# Patient Record
Sex: Female | Born: 1940 | Hispanic: No | Marital: Single
Health system: Southern US, Community
[De-identification: ages and names within clinical notes are randomized; demographics above are authoritative.]

## PROBLEM LIST (undated history)

## (undated) ENCOUNTER — Emergency Department (HOSPITAL_COMMUNITY): Discharge status: Absent without leave | Payer: Self-pay

## (undated) DIAGNOSIS — R29898 Other symptoms and signs involving the musculoskeletal system: Secondary | ICD-10-CM

## (undated) DIAGNOSIS — H538 Other visual disturbances: Secondary | ICD-10-CM

## (undated) DIAGNOSIS — I10 Essential (primary) hypertension: Secondary | ICD-10-CM

## (undated) DIAGNOSIS — I499 Cardiac arrhythmia, unspecified: Secondary | ICD-10-CM

## (undated) DIAGNOSIS — M79606 Pain in leg, unspecified: Secondary | ICD-10-CM

## (undated) DIAGNOSIS — A419 Sepsis, unspecified organism: Secondary | ICD-10-CM

## (undated) DIAGNOSIS — N12 Tubulo-interstitial nephritis, not specified as acute or chronic: Secondary | ICD-10-CM

## (undated) DIAGNOSIS — I639 Cerebral infarction, unspecified: Secondary | ICD-10-CM

## (undated) DIAGNOSIS — Z87442 Personal history of urinary calculi: Secondary | ICD-10-CM

## (undated) DIAGNOSIS — Z95818 Presence of other cardiac implants and grafts: Secondary | ICD-10-CM

## (undated) DIAGNOSIS — R269 Unspecified abnormalities of gait and mobility: Secondary | ICD-10-CM

## (undated) DIAGNOSIS — E119 Type 2 diabetes mellitus without complications: Secondary | ICD-10-CM

## (undated) DIAGNOSIS — IMO0002 Reserved for concepts with insufficient information to code with codable children: Secondary | ICD-10-CM

## (undated) DIAGNOSIS — E1165 Type 2 diabetes mellitus with hyperglycemia: Secondary | ICD-10-CM

## (undated) DIAGNOSIS — I8390 Asymptomatic varicose veins of unspecified lower extremity: Secondary | ICD-10-CM

## (undated) DIAGNOSIS — E785 Hyperlipidemia, unspecified: Secondary | ICD-10-CM

## (undated) DIAGNOSIS — S42213A Unspecified displaced fracture of surgical neck of unspecified humerus, initial encounter for closed fracture: Secondary | ICD-10-CM

## (undated) DIAGNOSIS — I739 Peripheral vascular disease, unspecified: Secondary | ICD-10-CM

## (undated) DIAGNOSIS — M1990 Unspecified osteoarthritis, unspecified site: Secondary | ICD-10-CM

## (undated) HISTORY — DX: Essential (primary) hypertension: I10

## (undated) HISTORY — DX: Type 2 diabetes mellitus with hyperglycemia: E11.65

## (undated) HISTORY — DX: Reserved for concepts with insufficient information to code with codable children: IMO0002

## (undated) HISTORY — DX: Pain in leg, unspecified: M79.606

## (undated) HISTORY — DX: Peripheral vascular disease, unspecified: I73.9

## (undated) HISTORY — DX: Hyperlipidemia, unspecified: E78.5

## (undated) HISTORY — PX: VEIN SURGERY: SHX48

## (undated) HISTORY — DX: Asymptomatic varicose veins of unspecified lower extremity: I83.90

---

## 1997-08-16 ENCOUNTER — Emergency Department (HOSPITAL_COMMUNITY): Admission: EM | Admit: 1997-08-16 | Discharge: 1997-08-16 | Payer: Self-pay | Admitting: Emergency Medicine

## 1997-10-17 ENCOUNTER — Emergency Department (HOSPITAL_COMMUNITY): Admission: EM | Admit: 1997-10-17 | Discharge: 1997-10-18 | Payer: Self-pay | Admitting: Emergency Medicine

## 1999-08-10 ENCOUNTER — Ambulatory Visit (HOSPITAL_COMMUNITY): Admission: RE | Admit: 1999-08-10 | Discharge: 1999-08-10 | Payer: Self-pay

## 2002-01-22 ENCOUNTER — Ambulatory Visit (HOSPITAL_COMMUNITY): Admission: RE | Admit: 2002-01-22 | Discharge: 2002-01-22 | Payer: Self-pay | Admitting: Internal Medicine

## 2002-01-22 ENCOUNTER — Encounter: Payer: Self-pay | Admitting: Internal Medicine

## 2003-01-15 ENCOUNTER — Emergency Department (HOSPITAL_COMMUNITY): Admission: EM | Admit: 2003-01-15 | Discharge: 2003-01-15 | Payer: Self-pay | Admitting: Emergency Medicine

## 2003-01-16 ENCOUNTER — Ambulatory Visit (HOSPITAL_COMMUNITY): Admission: RE | Admit: 2003-01-16 | Discharge: 2003-01-16 | Payer: Self-pay | Admitting: Emergency Medicine

## 2009-05-04 ENCOUNTER — Ambulatory Visit: Payer: Self-pay | Admitting: Family Medicine

## 2009-05-04 ENCOUNTER — Encounter (INDEPENDENT_AMBULATORY_CARE_PROVIDER_SITE_OTHER): Payer: Self-pay | Admitting: Family Medicine

## 2009-05-04 LAB — CONVERTED CEMR LAB
ALT: 14 units/L (ref 0–35)
Albumin: 4.2 g/dL (ref 3.5–5.2)
Basophils Relative: 0 % (ref 0–1)
CO2: 26 meq/L (ref 19–32)
Cholesterol: 208 mg/dL — ABNORMAL HIGH (ref 0–200)
Glucose, Bld: 207 mg/dL — ABNORMAL HIGH (ref 70–99)
Hemoglobin: 14.8 g/dL (ref 12.0–15.0)
LDL Cholesterol: 117 mg/dL — ABNORMAL HIGH (ref 0–99)
Lymphocytes Relative: 30 % (ref 12–46)
Lymphs Abs: 2.5 10*3/uL (ref 0.7–4.0)
MCHC: 31.1 g/dL (ref 30.0–36.0)
Monocytes Absolute: 0.5 10*3/uL (ref 0.1–1.0)
Monocytes Relative: 6 % (ref 3–12)
Neutro Abs: 5.3 10*3/uL (ref 1.7–7.7)
Neutrophils Relative %: 63 % (ref 43–77)
Potassium: 4.7 meq/L (ref 3.5–5.3)
RBC: 5.75 M/uL — ABNORMAL HIGH (ref 3.87–5.11)
Sodium: 140 meq/L (ref 135–145)
Total Bilirubin: 0.8 mg/dL (ref 0.3–1.2)
Total Protein: 7.2 g/dL (ref 6.0–8.3)
VLDL: 37 mg/dL (ref 0–40)
Vit D, 25-Hydroxy: 18 ng/mL — ABNORMAL LOW (ref 30–89)
WBC: 8.4 10*3/uL (ref 4.0–10.5)

## 2009-05-26 ENCOUNTER — Ambulatory Visit: Payer: Self-pay | Admitting: Internal Medicine

## 2009-05-31 ENCOUNTER — Ambulatory Visit: Payer: Self-pay | Admitting: Internal Medicine

## 2009-06-17 ENCOUNTER — Ambulatory Visit (HOSPITAL_COMMUNITY): Admission: RE | Admit: 2009-06-17 | Discharge: 2009-06-17 | Payer: Self-pay | Admitting: Family Medicine

## 2009-07-01 ENCOUNTER — Ambulatory Visit: Payer: Self-pay | Admitting: Family Medicine

## 2010-02-09 ENCOUNTER — Emergency Department (HOSPITAL_COMMUNITY)
Admission: EM | Admit: 2010-02-09 | Discharge: 2010-02-09 | Payer: Self-pay | Source: Home / Self Care | Admitting: Emergency Medicine

## 2010-05-24 LAB — COMPREHENSIVE METABOLIC PANEL
ALT: 37 U/L — ABNORMAL HIGH (ref 0–35)
BUN: 14 mg/dL (ref 6–23)
CO2: 29 mEq/L (ref 19–32)
Calcium: 8.7 mg/dL (ref 8.4–10.5)
Creatinine, Ser: 0.97 mg/dL (ref 0.4–1.2)
GFR calc non Af Amer: 57 mL/min — ABNORMAL LOW (ref >60)
Glucose, Bld: 104 mg/dL — ABNORMAL HIGH (ref 70–99)

## 2010-05-24 LAB — URINALYSIS, ROUTINE W REFLEX MICROSCOPIC
Glucose, UA: NEGATIVE mg/dL
Protein, ur: 30 mg/dL — AB
Specific Gravity, Urine: 1.015 (ref 1.005–1.030)
Urobilinogen, UA: 1 mg/dL (ref 0.0–1.0)

## 2010-05-24 LAB — CBC
HCT: 45.7 % (ref 36.0–46.0)
Hemoglobin: 14.5 g/dL (ref 12.0–15.0)
MCH: 26.4 pg (ref 26.0–34.0)
MCHC: 31.7 g/dL (ref 30.0–36.0)
MCV: 83.1 fL (ref 78.0–100.0)

## 2010-05-24 LAB — DIFFERENTIAL
Eosinophils Absolute: 0 10*3/uL (ref 0.0–0.7)
Lymphocytes Relative: 5 % — ABNORMAL LOW (ref 12–46)
Lymphs Abs: 0.4 10*3/uL — ABNORMAL LOW (ref 0.7–4.0)
Neutro Abs: 7.7 10*3/uL (ref 1.7–7.7)
Neutrophils Relative %: 95 % — ABNORMAL HIGH (ref 43–77)

## 2010-05-24 LAB — LIPASE, BLOOD: Lipase: 24 U/L (ref 11–59)

## 2010-05-24 LAB — GLUCOSE, CAPILLARY: Glucose-Capillary: 174 mg/dL — ABNORMAL HIGH (ref 70–99)

## 2010-05-24 LAB — URINE MICROSCOPIC-ADD ON

## 2010-07-29 NOTE — Op Note (Signed)
[facility]  Patient:    Meghan Ford, Meghan Ford                    MRN: [id number] Proc. Date: 08/10/99 Adm. Date:  [id number] Disc. 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  PROCEDURE:  Ligation and complete stripping of the left long saphenous vein.  SURGEON:  Zigmund Daniel, M.D.  ANESTHESIA:  General.  DESCRIPTION OF PROCEDURE:  After adequate general anesthesia and routine preparation of the left lower abdomen, groin, leg, and foot, I made first an incision just medial to the femoral pulse just below the groin crease.  I dissected down and exposed the long saphenous vein and clipped and in certain cases divided its tributaries.  I exposed it for a good distance until I saw it entering the fossa ovalis and controlled it proximally and distally.  I made an incision anterior to the medial malleolus and dissected down through the scarred soft tissue and encountered the long saphenous vein at the ankle. I ligated it toward the foot and controlled it proximally and passed the stripper up.  I went up as far as the proximal medial leg and would not go any farther.  I cut down on it at that point and exposed the vein and found it quite fragile.  Despite a lot of manipulation, I could not get the stripper to pass up past the knee.  I stripped out the distal part of the vein and got hemostasis with pressure.  I then also removed a number of marked varicosities, which were not immediately under the crus of the vein.  Bleeding was not a problem.  I could not get the stripper to pass up the long saphenous vein from the knee area.  I passed it down from the groin after making a small hole in the long saphenous vein, and it went to the  mid-thigh.  I encountered a valve or kink, and I could not get it any farther down until I cut down on it, and I was able to manipulate it on down to the proximal leg.  This required one small additional counter incision.  I then stripped out the long saphenous vein in the thigh and got hemostasis with pressure.  After assuring good hemostasis, I closed all incisions with staples.  I then thoroughly cleaned up the ankle ulcer and applied a moist bandage.  I applied 4 x 4s to all the incisions, cleaning them up, and wrapped the leg with a bulky compressive bandage.  The patient tolerated the operation well. DD:  08/10/99 TD:  08/13/99 Job: [id number]

## 2011-05-10 ENCOUNTER — Other Ambulatory Visit: Payer: Self-pay

## 2011-05-10 DIAGNOSIS — M79609 Pain in unspecified limb: Secondary | ICD-10-CM

## 2011-05-10 DIAGNOSIS — I83893 Varicose veins of bilateral lower extremities with other complications: Secondary | ICD-10-CM

## 2011-06-20 ENCOUNTER — Encounter: Payer: Self-pay | Admitting: Vascular Surgery

## 2011-07-05 ENCOUNTER — Encounter: Payer: Self-pay | Admitting: Vascular Surgery

## 2011-07-06 ENCOUNTER — Encounter: Payer: Self-pay | Admitting: Vascular Surgery

## 2011-08-09 ENCOUNTER — Encounter: Payer: Self-pay | Admitting: Vascular Surgery

## 2011-08-10 ENCOUNTER — Ambulatory Visit (INDEPENDENT_AMBULATORY_CARE_PROVIDER_SITE_OTHER): Payer: Self-pay | Admitting: Vascular Surgery

## 2011-08-10 ENCOUNTER — Encounter: Payer: Self-pay | Admitting: Vascular Surgery

## 2011-08-10 ENCOUNTER — Ambulatory Visit (INDEPENDENT_AMBULATORY_CARE_PROVIDER_SITE_OTHER): Payer: Self-pay | Admitting: *Deleted

## 2011-08-10 VITALS — BP 152/70 | HR 69 | Temp 98.3°F | Ht 64.0 in | Wt 226.0 lb

## 2011-08-10 DIAGNOSIS — I83893 Varicose veins of bilateral lower extremities with other complications: Secondary | ICD-10-CM

## 2011-08-10 DIAGNOSIS — I872 Venous insufficiency (chronic) (peripheral): Secondary | ICD-10-CM

## 2011-08-10 DIAGNOSIS — M79609 Pain in unspecified limb: Secondary | ICD-10-CM

## 2011-08-10 NOTE — Progress Notes (Signed)
[facility] [place] HISTORY AND PHYSICAL   History of Present Illness:  Patient is a 71 y.o. year old female who presents for evaluation of varicose veins .  The patient previously underwent some type of vein stripping procedure by Dr. Orson Slick several years ago for a left malleolus ulcer. The ulcer healed after that procedure. She complains primarily of achiness heaviness and pain in the right leg. She states that the pain gets worse in her right leg after standing for long periods. She has worn compression stockings some in the past. She really has no left leg complaints. She has had no ulcerations recently. Other medica l problems include diabetes, hypertension.  The patient speaks only Spanish and interpretation was through her son  Past Medical History  Diagnosis Date  . Diabetes mellitus   . Hypertension   . Peripheral vascular disease   . Leg pain     worse with prolonged standing  . Varicose veins     History reviewed. No pertinent past surgical history.   Social History History  Substance Use Topics  . Smoking status: Never Smoker   . Smokeless tobacco: Never Used  . Alcohol Use: No    Family History History reviewed. No pertinent family history.  Allergies  No Known Allergies   Current Outpatient Prescriptions  Medication Sig Dispense Refill  . insulin aspart (NOVOLOG FLEXPEN) 100 UNIT/ML injection Inject 10 Units into the skin 3 (three) times daily before meals.      . insulin glargine (LANTUS) 100 UNIT/ML injection Inject 30 Units into the skin at bedtime.      Marland Kitchen lisinopril-hydrochlorothiazide (PRINZIDE,ZESTORETIC) 20-25 MG per tablet Take 1 tablet by mouth daily.        ROS:   General:  No weight loss, Fever, chills  HEENT: No recent headaches, no nasal bleeding, no visual changes, no sore throat  Neurologic: No dizziness, blackouts, seizures. No recent symptoms of stroke or mini- stroke. No recent episodes of slurred speech, or  temporary blindness.  Cardiac: No recent episodes of chest pain/pressure, no shortness of breath at rest.  No shortness of breath with exertion.  Denies history of atrial fibrillation or irregular heartbeat  Vascular: No history of rest pain in feet.  No history of claudication.  No history of non-healing ulcer, No history of DVT   Pulmonary: No home oxygen, no productive cough, no hemoptysis,  No asthma or wheezing  Musculoskeletal:  [ ]  Arthritis, [ ]  Low back pain,  [ ]  Joint pain  Hematologic:No history of hypercoagulable state.  No history of easy bleeding.  No history of anemia  Gastrointestinal: No hematochezia or melena,  No gastroesophageal reflux, no trouble swallowing  Urinary: [ ]  chronic Kidney disease, [ ]  on HD - [ ]  MWF or [ ]  TTHS, [ ]  Burning with urination, [ ]  Frequent urination, [ ]  Difficulty urinating;   Skin: No rashes  Psychological: No history of anxiety,  No history of depression   Physical Examination  Filed Vitals:   08/10/11 1142  BP: 152/70  Pulse: 69  Temp: 98.3 F (36.8 C)  TempSrc: Oral  Height: 5\' 4"  (1.626 m)  Weight: 226 lb (102.513 kg)    Body mass index is 38.79 kg/(m^2).  General:  Alert and oriented, no acute distress HEENT: Normal Neck: No bruit or JVD Pulmonary: Clear to auscultation bilaterally Cardiac: Regular Rate and Rhythm without murmur Abdomen: Soft, non-tender, non-distended, no mass, obese Skin: No rash, bilateral hemosiderin staining of the  gaiter area both legs Extremity Pulses:  2+ radial, brachial, femoral, dorsalis pedis pulses bilaterally, multiple large clusters of varicosities in the right posterior medial leg proximal to 7 mm in diameter throughout the entire posterior thigh and some on the medial portion of the calf these are all essentially the course the greater saphenous vein. There is a well-healed scar at her left medial malleolus from her previous ulcer. She has a few large scattered varicosities in the  left posterior thigh. He is approximately 4-5 mm in diameter. Musculoskeletal: No deformity or edema  Neurologic: Upper and lower extremity motor 5/5 and symmetric  DATA: Patient had a venous duplex exam today which showed reflux throughout her entire right greater saphenous vein with some varicosities in the posterior left leg I reviewed and interpreted this study. Her deep venous system had no reflux   ASSESSMENT: Symptomatic varicose veins right lower extremity.   PLAN:  We will place the patient and compression stockings today thigh high. She will followup with Dr. Arbie Cookey in 3 months to consider laser ablation of her greater saphenous vein.  Fabienne Bruns, MD [facility] [place] Office: [phone] Pager: [phone]

## 2011-08-18 NOTE — Procedures (Unsigned)
LOWER EXTREMITY VENOUS REFLUX EXAM  INDICATION:  Varicose veins.  EXAM:  Using color-flow imaging and pulse Doppler spectral analysis, the right common femoral, superficial femoral, popliteal, posterior tibial, greater and lesser saphenous veins are evaluated.  There is no evidence suggesting deep venous insufficiency in the right lower extremity.  The right saphenofemoral junction is not competent with Reflux of >500 milliseconds. The right GSV is not competent with Reflux of >500 milliseconds with the caliber as described below.   The right proximal small saphenous vein demonstrates competency.  GSV Diameter (used if found to be incompetent only)                                                  Right      Left Proximal Greater Saphenous Vein                  0.75 cm    cm Proximal-to-mid-thigh                            0.77 cm    cm Mid thigh                                        0.74 cm    cm Mid-distal thigh                                 cm         cm Distal thigh                                     0.77 cm    cm Knee                                             0.79 cm    cm  IMPRESSION: 1. Right great saphenous vein is not competent with reflux >500     milliseconds. 2. The right deep venous system is competent. 3. The right small saphenous vein is competent.  ___________________________________________ Janetta Hora. Fields, MD  LT/MEDQ  D:  08/10/2011  T:  08/10/2011  Job:  [id number]

## 2011-11-06 ENCOUNTER — Encounter: Payer: Self-pay | Admitting: Vascular Surgery

## 2011-11-07 ENCOUNTER — Encounter: Payer: Self-pay | Admitting: Vascular Surgery

## 2011-11-07 ENCOUNTER — Ambulatory Visit (INDEPENDENT_AMBULATORY_CARE_PROVIDER_SITE_OTHER): Payer: Self-pay | Admitting: Vascular Surgery

## 2011-11-07 VITALS — BP 140/61 | HR 86 | Resp 16 | Ht 64.0 in | Wt 227.0 lb

## 2011-11-07 DIAGNOSIS — I83893 Varicose veins of bilateral lower extremities with other complications: Secondary | ICD-10-CM

## 2011-11-07 NOTE — Progress Notes (Signed)
The patient presents today for continued followup of her right leg pain. She does not speak [place] and is here today with her son who is interpreted for her. She continues to have discomfort despite compression garments and elevation. She does have marked varicosities and marked changes of venous hypertension with hemosiderin deposits and thickening of her skin.  I imaged her right leg with SonoSite ultrasound. This does show a dilated refluxing great saphenous vein but exits the fascia the mid to distal thigh with large varicosities arising from this.  Discussed options with the patient and her son. I have recommended laser ablation of her right great saphenous vein and stab phlebectomy of multiple recurrent varicosities in her thigh and calf. Understands this is an outpatient procedure in our office under local anesthesia. We will need to arrange for a Spanish interpreter for the procedure. We will schedule this at her convenience

## 2011-12-11 ENCOUNTER — Ambulatory Visit: Payer: Self-pay | Admitting: Family Medicine

## 2011-12-11 VITALS — BP 102/78 | HR 67 | Temp 98.3°F | Resp 18 | Ht 64.0 in | Wt 220.0 lb

## 2011-12-11 DIAGNOSIS — I10 Essential (primary) hypertension: Secondary | ICD-10-CM

## 2011-12-11 DIAGNOSIS — E119 Type 2 diabetes mellitus without complications: Secondary | ICD-10-CM

## 2011-12-11 DIAGNOSIS — R531 Weakness: Secondary | ICD-10-CM

## 2011-12-11 DIAGNOSIS — R5381 Other malaise: Secondary | ICD-10-CM

## 2011-12-11 LAB — BASIC METABOLIC PANEL
CO2: 30 mEq/L (ref 19–32)
Chloride: 102 mEq/L (ref 96–112)
Creat: 1.1 mg/dL (ref 0.50–1.10)
Sodium: 139 mEq/L (ref 135–145)

## 2011-12-11 LAB — POCT CBC
Granulocyte percent: 63.3 %G (ref 37–80)
HCT, POC: 48.5 % — AB (ref 37.7–47.9)
MCV: 85.2 fL (ref 80–97)
POC LYMPH PERCENT: 31.8 %L (ref 10–50)
RDW, POC: 13.3 %
WBC: 10 10*3/uL (ref 4.6–10.2)

## 2011-12-11 LAB — GLUCOSE, POCT (MANUAL RESULT ENTRY): POC Glucose: 188 mg/dl — AB (ref 70–99)

## 2011-12-11 NOTE — Patient Instructions (Addendum)
Start taking your insulin three times a day as directed.  You are supposed to take 10 units just before each meal.    You are going to have a CT of your head done today. It will be done at [facility], [address]  4:30 pm.  You may go home after the scan and I will call you     Gua de planeamiento de la alimentacin para diabticos (Diabetes Meal Planning Guide) La gua de planeamiento de alimentacin para diabticos es una herramienta para ayudarlo a planear sus comidas y colaciones. Es importante para las personas con diabetes controlar sus niveles de International aid/development worker. Elegir los [employer] correctos y las cantidades adecuadas durante el da le ayudar a Technical brewer. Comer bien puede incluso ayudarlo a mejorar la presin sangunea y Barista o Pharmacologist un peso saludable. CUENTE LOS HIDRATOS DE CARBONO CON FACILIDAD Cuando consume hidratos de carbono, stos se transforman en azcar (glucosa). Esto a su vez Counsellor de Production assistant, radio. El conteo de carbohidratos puede ayudarlo a Chief Operating Officer este nivel para que se sienta mejor. Al planear sus alimentos con el conteo de carbohidratos, podr tener ms flexibilidad en lo que come y Physiological scientist con el consumo de alimentos. El conteo de carbohidratos significa simplemente sumar la cantidad total de gramos de carbohidratos a sus comidas o colaciones. Trate de consumir la misma cantidad en cada comida. A continuacin encontrar una lista de 1 porcin o 15 gr. de carbohidratos. A continuacin se enumeran. Pregunte al mdico cuntos gramos de carbohidratos necesita comer en cada comida o colacin. Almidones y granos  1 [place] de pan.    bollo ingls o bollo para hamburguesa o hotdog.    taza de cereal fro (sin azcar).   ? taza de pasta o arroz cocido.    taza de vegetales que contengan almidn (maz, papas, arvejas, porotos, calabaza).   1 omelette (6 pulgadas).    bollo.   1 waffle o panqueque  (del tamao de un CD).    taza de cereales cocidos.   4 a 6 galletas saldas pequeas.  *Se recomienda el consumo de granos enteros. Frutas  1 taza de frutos rojos, meln, papaya o anan sin azcar.   1 fruta fresca pequea.    banana o mango.    taza de jugo de frutas (4 onzas sin endulzar).    taza de fruta envasada en jugo natural o agua.   2 cucharadas de frutas secas.   12-15 uvas o cerezas.  Leche y yogurt  1 taza de [employer] o al 1%.   1 taza de leche de soja.   6 onzas de yogurt descremado con edulcorante sin azcar.   6 onzas de yogur descremado de soja.   6 onzas de yogur natural.  Vegetales  1 taza de vegetales crudos o  de vegetales cocidos se considera cero carbohidratos o una comida "libre".   Si come 3 o ms porciones en una comida, cuntelas como 1 porcin de carbohidratos.  Otros carbohidratos   onzas de chips o pretzels.    taza de helado de crema o yogur helado.    taza de helado de agua.   5 cm de torta no congelada.   1 cucharada de miel, azcar, mermelada, jalea o almbar.   2 galletitas dulces pequeas.   3 cuadrados de crackers de graham.   3 tazas de palomitas de maz.   6 crackers.   1 taza de caldo.  Cuente 1 taza de guisado u otra mezcla de alimentos como 2 porciones de carbohidratos.   Los alimentos con menos de 20 caloras por porcin deben contarse como cero carbohidratos o alimento "libre".  Si lo desea compre un libro o software de computacin que enumere la cantidad de gramos de carbohidratos de los diferentes alimentos. Adems, el panel nutricional en las etiquetas de los productos que consume es una buena fuente de informacin. Le indicar el tamao de la porcin y la cantidad total de carbohidratos que consumir por cada una. Divida este nmero por 15 para obtener el nmero de conteo de carbohidratos por porcin. Recuerde: cada porcin son 106 gramos de carbohidratos. PORCIONES La medicin de los  alimentos y el tamao de las porciones lo ayudarn a Scientist, physiological cantidad exacta de comida que debe ingerir. La lista que sigue le mostrar el tamao de algunas porciones comunes.   1 onza.................4 dados apilados.   3 onzas..............Marland KitchenUn mazo de cartas.   1 cucharadita...Marland KitchenMarland KitchenLa punta de un dedo pequeo.   1 cucharada.......Marland KitchenUn dedo.   2 cucharadas....Marland KitchenMarland KitchenUna pelota de golf.    taza..............Marland KitchenLa mitad de un puo.   1 taza...............Marland KitchenUn puo.  EJEMPLO DE PLAN DE ALIMENTACIN PARA DIABTICOS: A continuacin se muestra un ejemplo de plan de alimentacin que incluye comidas de los grupos de granos y [place], Sports administrator, frutas y carnes. Un nutricionista podr confeccionarle un plan individualizado para cubrir sus necesidades calricas y decirle el nmero de porciones que necesita de [place]. Sin embargo, podra [employer] alimentos que contengan carbohidratos (lcteos, cereales y frutas). Controlar la cantidad total de carbohidratos en los alimentos o colaciones es ms importante que asegurarse de incluir todos los grupos alimenticios cada vez que come.  El siguiente plan de alimentacin es un ejemplo de una dieta de 2000 caloras mediante el conteo de carbohidratos. Este plan contiene 17 porciones de carbohidratos. Desayuno  1 taza de avena (2 porciones de carbohidratos).    taza de yogur light(1 porcin de carbohidratos).   1 taza de arndanos (1 porcin de carbohidratos).    taza de almendras.  Colacin  1 manzana grande (2 porciones de carbohidratos).   1 palito de queso bajo [employer].  Almuerzo  Ensalada de pechuga de pollo.   1 taza de espinacas.    taza de tomates cortados.   2 oz (60 gr) de pechuga de pollo en rebanadas.   2 cucharadas de aderezo italiano bajo en [employer].   12 galletas integrales (2 porciones de carbohidratos).   12 a 15 uvas (1 porcin de carbohidratos).   1 taza de [employer] (1porcin de carbohidratos).    Colacin  1 taza de zanahorias.    taza de pur de garbanzos (1 porcin de carbohidratos).  Cena  3 oz (80 gr) de salmn a la parrilla.   1 taza de arroz integral (3 porciones de carbohidratos).  Colacin  1  taza de brcoli al vapor (1 porcin de carbohidrato) con una cucharadita de aceite de oliva y jugo de limn.   1 taza de budn light (2 porciones de carbohidratos).  HOJA DE PLANEAMIENTO DE LA ALIMENTACIN: El dietista podr utilizar esta hoja para ayudarlo a decidir cuntas porciones y qu tipos de alimentos son los adecuados para usted.  DESAYUNO Grupo de alimentos y porciones / Alimento elegido Granos/Fculas_________________________________________________ Lcteos________________________________________________________ Rufina Falco ______________________________________________________ Lou Miner __________________________________________________________ Charlesetta Ivory _________________________________________________________ Rosalin Hawking _________________________________________________________ Lorin Mercy de alimentos y porciones / Alimento elegido Granos/Fculas___________________________________________________ Lcteos_________________________________________________________ Lou Miner ___________________________________________________________ Charlesetta Ivory __________________________________________________________ Rosalin Hawking __________________________________________________________ Danford Bad de alimentos y  porciones / Alimento elegido Granos/Fculas___________________________________________________ Lcteos_________________________________________________________ Lou Miner ___________________________________________________________ Charlesetta Ivory __________________________________________________________ Rosalin Hawking __________________________________________________________ Jettie Pagan de alimentos y porciones / Alimento  elegido Granos/Fculas_________________________________________________ Lcteos________________________________________________________ Rufina Falco ______________________________________________________ Lou Miner _________________________________________________________ Charlesetta Ivory ________________________________________________________ Rosalin Hawking ________________________________________________________ Carolyn Stare DIARIOS Fculas_______________________________________________________ Vegetales _____________________________________________________ Lou Miner ________________________________________________________ Lcteos_______________________________________________________ Carnes________________________________________________________ Rosalin Hawking ________________________________________________________ Document Released: 06/06/2007 Document Revised: 02/16/2011 ExitCare Patient Information 2012 [place], [place].

## 2011-12-11 NOTE — Progress Notes (Signed)
[facility] [address], [place] [address] [phone]  Date:  12/11/2011   Name:  Meghan Ford   DOB:  Aug 31, 1940   MRN:  [id number]  PCP:  Jaclyn Shaggy, MD    Chief Complaint: Numbness and Headache   History of Present Illness:  Meghan Ford is a 71 y.o. very pleasant female patient who presents with the following:  She is here today with illness.  She notes a headache, and her left arm has felt weak and numb for about 5 days.  She also notes that the left side of her face is numb.  Also her eyesight seems "blurry and foggy" for about 5 days.   They have not noted any slurred speech or difficulty speaking and eating.  She does tend to be "very weak" and will sometimes doze off during conversation.  This problem seems to come and go.  Her numbness also has not been constant.  She has had these symptoms for a day or so at a time in the past, but they have never lasted so long.   She has never been diagnosed with a stroke or mini- stroke- however she does have several risk factors  She has not taken any medication for her HA.    She suffers from DM (on insulin), HTN, and peripheral vascular disease.    Meghan Ford does not speak [place]- here today with her daughter- in -law who interprets for her.    Her insulin regimen is unknown- her daughter in law Meghan Ford) is not really sure of what she is taking.  They are not sure if she has lantus or novolog at home.  She is taking 10 units of something twice a day when she is prompted by her family- however she skips it a lot of the ti61me.  They do think that she has a "flexpen" so she probably is using novolog.   [phone]- phone number for Meghan Ford [phone] phone number for her son  Patient Active Problem List  Diagnosis  . Unspecified venous (peripheral) insufficiency  . Varicose veins of lower extremities with other complications    Past Medical History  Diagnosis Date  .  Diabetes mellitus   . Hypertension   . Peripheral vascular disease   . Leg pain     worse with prolonged standing  . Varicose veins     No past surgical history on file.  History  Substance Use Topics  . Smoking status: Never Smoker   . Smokeless tobacco: Never Used  . Alcohol Use: No    No family history on file.  No Known Allergies  Medication list has been reviewed and updated.  Current Outpatient Prescriptions on File Prior to Visit  Medication Sig Dispense Refill  . insulin aspart (NOVOLOG FLEXPEN) 100 UNIT/ML injection Inject 10 Units into the skin 3 (three) times daily before meals.      . insulin glargine (LANTUS) 100 UNIT/ML injection Inject 30 Units into the skin at bedtime.      Marland Kitchen lisinopril-hydrochlorothiazide (PRINZIDE,ZESTORETIC) 20-25 MG per tablet Take 1 tablet by mouth daily.      Marland Kitchen NIFEdipine (PROCARDIA-XL/ADALAT CC) 60 MG 24 hr tablet Take 60 mg by mouth daily.      . pravastatin (PRAVACHOL) 40 MG tablet Take 40 mg by mouth at bedtime.        Review of Systems:  As per HPI- otherwise negative. Meghan Ford denies any CP or SOB today.  She sometimes has  some pain in her back when she coughs, but that is all   Physical Examination: Filed Vitals:   12/11/11 1357  BP: 102/78  Pulse: 67  Temp: 98.3 F (36.8 C)  Resp: 18   Filed Vitals:   12/11/11 1357  Height: 5\' 4"  (1.626 m)  Weight: 220 lb (99.791 kg)   Body mass index is 37.76 kg/(m^2). Ideal Body Weight: Weight in (lb) to have BMI = 25: 145.3   GEN: WDWN, NAD, Non-toxic, A & O x 3, obese HEENT: Atraumatic, Normocephalic. Neck supple. No masses, No LAD.  Bilateral TM wnl, oropharynx normal.  PEERL,EOMI.  Seems to have cataracts bilaterally.  No facial asymmetry or trouble with facial muscles- normal facial movement.   Ears and Nose: No external deformity.   CV: RRR, No M/G/R. No JVD. No thrill. No extra heart sounds. PULM: CTA B, no wheezes, crackles, rhonchi. No retractions. No resp. distress.  No accessory muscle use. ABD: S, NT, ND, +BS. No rebound. No HSM. EXTR: No c/c/e NEURO Normal gait. She states that she has normal and equal sensation of all extremities,  Normal strength, DTR are 1+ throughout. Negative arm drift test PSYCH: Normally interactive. Conversant. Not depressed or anxious appearing.  Calm demeanor.   EKG: sinus rhythm with narrow Q waves in III and avF, avR.  Possible old infarct, but no acute ST elevation or depression.    Results for orders placed in visit on 12/11/11  POCT CBC      Component Value Range   WBC 10.0  4.6 - 10.2 K/uL   Lymph, poc 3.2  0.6 - 3.4   POC LYMPH PERCENT 31.8  10 - 50 %L   MID (cbc) 0.5  0 - 0.9   POC MID % 4.9  0 - 12 %M   POC Granulocyte 6.3  2 - 6.9   Granulocyte percent 63.3  37 - 80 %G   RBC 5.69 (*) 4.04 - 5.48 M/uL   Hemoglobin 14.8  12.2 - 16.2 g/dL   HCT, POC 57.8 (*) 46.9 - 47.9 %   MCV 85.2  80 - 97 fL   MCH, POC 26.0 (*) 27 - 31.2 pg   MCHC 30.5 (*) 31.8 - 35.4 g/dL   RDW, POC 62.9     Platelet Count, POC 303  142 - 424 K/uL   MPV 10.5  0 - 99.8 fL  POCT GLYCOSYLATED HEMOGLOBIN (HGB A1C)      Component Value Range   Hemoglobin A1C 11.4%+    GLUCOSE, POCT (MANUAL RESULT ENTRY)      Component Value Range   POC Glucose 188 (*) 70 - 99 mg/dl    Assessment and Plan: 1. Weakness  POCT CBC, EKG 12-Lead, CT Head Wo Contrast  2. Diabetes mellitus type II  POCT glycosylated hemoglobin (Hb A1C), POCT glucose (manual entry)  3. HTN (hypertension)  Basic metabolic panel   Extensive discussion with Meghan Ford through her DIL.  She needs a CT of her head to evaluate for a possible stroke today.  She has had possible CVA symptoms for several days, so the window for any acute treatment is passed and she does not seem to have any deficits. They agreed to do this at [facility], and directions and payment information were given.  Assuming her CT is normal, her symptoms are probably due to hyperglycemia.  Discussed diet  changes, and instructed them to start using her novolog (it seems this is what they have at home after  all) TID as directed.  Otherwise I will await her BMP to make sure she has no electrolyte abnormalities.    7pm- I called GI, and it seems they did not appear for the CT.  Called both numbers above but no answer.  Called several of the listed numbers again around 8:20- no answer.  Did leave a message instructing them to proceed to the ED if they still want to have her CT done  [facility], MD

## 2011-12-13 ENCOUNTER — Encounter: Payer: Self-pay | Admitting: Family Medicine

## 2011-12-14 ENCOUNTER — Inpatient Hospital Stay: Admission: RE | Admit: 2011-12-14 | Payer: No Typology Code available for payment source | Source: Ambulatory Visit

## 2012-02-02 ENCOUNTER — Encounter: Payer: Self-pay | Admitting: Family Medicine

## 2012-02-02 ENCOUNTER — Ambulatory Visit (INDEPENDENT_AMBULATORY_CARE_PROVIDER_SITE_OTHER): Payer: Self-pay | Admitting: Family Medicine

## 2012-02-02 VITALS — BP 125/66 | HR 65 | Temp 98.1°F | Ht 64.0 in | Wt 222.0 lb

## 2012-02-02 DIAGNOSIS — E1165 Type 2 diabetes mellitus with hyperglycemia: Secondary | ICD-10-CM

## 2012-02-02 DIAGNOSIS — I10 Essential (primary) hypertension: Secondary | ICD-10-CM

## 2012-02-02 DIAGNOSIS — E785 Hyperlipidemia, unspecified: Secondary | ICD-10-CM

## 2012-02-02 DIAGNOSIS — R51 Headache: Secondary | ICD-10-CM

## 2012-02-02 DIAGNOSIS — H538 Other visual disturbances: Secondary | ICD-10-CM

## 2012-02-02 NOTE — Patient Instructions (Addendum)
Start taking the Novolog three times a day before meals. Check your blood sugar 2 hours after each meal and in the morning (4x a day total). Record these numbers for me. When I see you back in 1-2 weeks, we will see if we need to start the Lantus back.  Start taking Aspirin 81 mg which you can pick up over the counter and take this every day.   Continue to work on the orange card.

## 2012-02-03 ENCOUNTER — Encounter: Payer: Self-pay | Admitting: Family Medicine

## 2012-02-03 DIAGNOSIS — E1165 Type 2 diabetes mellitus with hyperglycemia: Secondary | ICD-10-CM | POA: Insufficient documentation

## 2012-02-03 DIAGNOSIS — I10 Essential (primary) hypertension: Secondary | ICD-10-CM | POA: Insufficient documentation

## 2012-02-03 DIAGNOSIS — E785 Hyperlipidemia, unspecified: Secondary | ICD-10-CM | POA: Insufficient documentation

## 2012-02-03 MED ORDER — AMLODIPINE BESYLATE 10 MG PO TABS: 10.0000 mg | ORAL_TABLET | Freq: Every day | ORAL | Status: DC

## 2012-02-03 MED ORDER — GLUCOSE BLOOD VI STRP: ORAL_STRIP | Status: DC

## 2012-02-03 MED ORDER — INSULIN ASPART 100 UNIT/ML ~~LOC~~ SOLN: 10.0000 [IU] | Freq: Three times a day (TID) | SUBCUTANEOUS | Status: DC

## 2012-02-03 MED ORDER — LISINOPRIL-HYDROCHLOROTHIAZIDE 20-25 MG PO TABS: 1.0000 | ORAL_TABLET | Freq: Every day | ORAL | Status: DC

## 2012-02-03 MED ORDER — PRAVASTATIN SODIUM 40 MG PO TABS: 40.0000 mg | ORAL_TABLET | Freq: Every day | ORAL | Status: DC

## 2012-02-03 NOTE — Assessment & Plan Note (Addendum)
Poorly controlled with a1c > 11 due to noncompliance. Encouraged patient to take Novolog TID instead of once daily. Patient will record blood sugars and follow up as soon as she gets the orange card for further titration and consideration of restarting Lantus. I sent in pens as that is what patient knows how to use but may need to give patient vials for affordability. I do think patients symptoms (intermittent vision changes and headaches) could be attributed to hyperglycemia given noncompliance. Offered CT scan again but son states they could not afford this. Even if there was a stroke, this would be over 2 weeks of symptoms and no acute therapy could be given. Decision made to start Aspirin given no focal deficits (possible patient with just decreased vision over time given acuity vs effects of cataracts). LIkely not acute closed angle glaucoma given no eye pain.  Need to have patient follow up with optho once she gets the orange card. Patient to follow up within a week of checking blood sugars.

## 2012-02-03 NOTE — Progress Notes (Signed)
Subjective:  Patient presents today to establish care. Chief complaint-noted.   Former patient of [facility] has been out of several medications including Lantus for the last 2-3 months. SHe did not bring the typical [facility] record packet with her today.   Vision changes/DIabetes Mellitus-patient presented to [facility] on [facility]30 reporting 5 days of left sided weakness, numbness, and blurry and foggy vision> No slurred speech, difficulty swallowing at that time. The weakness and vision changes were intermittent. Symptoms seemed to be associated with high blood sugars and headaches. Patient states she is only taking 10 units of novolog in the morning when prescribed 30 units Lantus and 10 units novolog TID and cannot say exaclty why she has decreased her regimen. She was even instructed to take 10 units TID when seen at Urgent Care. At time of eval, patient was noted to have cataracts but nothing focal on neurological exam. A1c was noted to be 11.4 after patient ran out of Lantus from [facility] and reduced novolog dosing. A CT head scan was ordered due to concern TIA/stroke. Symptoms thought likely due to hyperglycemia if CT normal but patient never had the scan obtained. Per son, there was concern that they could not afford the scan and that may be why [name], daughter in law, did not take mother for scan.   Today, patient states that her only symptoms seem to be intermittent foggy vision and headaches. Denies eye pain.  The weakness has resolved in interval period. STill only taking 10 units novolog in AM only.   The following were reviewed and entered/updated in epic: Past Medical History  Diagnosis Date  . Diabetes mellitus type II, uncontrolled   . Hypertension   . Peripheral vascular disease   . Leg pain     worse with prolonged standing  . Varicose veins   . Hyperlipidemia LDL goal < 100    Past Surgical History  Procedure Date  . Other surgical history     reports  varicose vein procedure   Medications- reviewed and updated Reviewed problem list.  Allergies-reviewed and updated History   Social History  . Marital Status: Married    Spouse Name: N/A    Number of Children: N/A  . Years of Education: N/A   Social History Main Topics  . Smoking status: Never Smoker   . Smokeless tobacco: Never Used  . Alcohol Use: No  . Drug Use: No  . Sexually Active: None   Other Topics Concern  . None   Social History Narrative   Lives with son Bobbye Riggs who comes with her to majority of visits and helps coordinate care for her. He translates for her and has signed and patient signed a release for this on 02/02/12.     ROS--See HPI with following additions HA, cough for 2-3 weeks, increased urination  Objective: BP 125/66  Pulse 65  Temp 98.1 F (36.7 C) (Oral)  Ht 5\' 4"  (1.626 m)  Wt 222 lb (100.699 kg)  BMI 38.11 kg/m2 General appearance: alert and cooperative, obese in NAD Eyes: negative findings: conjunctivae and sclerae normal, corneas clear, pupils equal, round, reactive to light and accomodation, visual fields full to confrontation and unable to visualize fundus due to bilateral cataracts, positive findings: bilateral cataracts Ears: normal TM's and external ear canals both ears Nose: Nares normal. Septum midline. Mucosa normal. No drainage or sinus tenderness. Throat: lips, mucosa, and tongue normal; teeth and gums normal Lungs: clear to auscultation bilaterally Heart: regular rate and rhythm,  S1, S2 normal, no murmur, click, rub or gallop Abdomen: soft, non-tender; bowel sounds normal; no masses,  no organomegaly Extremities: extremities normal, atraumatic, no cyanosis or edema and varicose veins noted Pulses: 2+ and symmetric Neurologic: Alert and oriented X 3, normal strength and tone. Normal symmetric reflexes. Normal coordination and gait Mental status: Alert, oriented, thought content appropriate Cranial nerves: II: visual acuity  see below ., II: visual field normal, II: pupils equal, round, reactive to light and accommodation, III,IV,VI: extraocular muscles extra-ocular motions intact, V: mastication normal, V: facial light touch sensation normal bilaterally, VII: upper facial muscle function normal bilaterally, VII: lower facial muscle function normal bilaterally, VIII: hearing normal, IX: soft palate elevation normal bilaterally, XI: trapezius strength normal bilaterally, XI: sternocleidomastoid strength normal bilaterally, XII: tongue strength normal  Sensory: normal Motor: 5/5 muscle strength in bilateral upper and lower extremities Gait: Normal   Visual field testing 20/160 with individual eyes, 20/80 with both.   Assessment/Plan: See problem oriented charted

## 2012-02-22 ENCOUNTER — Encounter (HOSPITAL_COMMUNITY): Payer: Self-pay

## 2012-02-22 ENCOUNTER — Emergency Department (HOSPITAL_COMMUNITY)
Admission: EM | Admit: 2012-02-22 | Discharge: 2012-02-22 | Disposition: A | Discharge status: Home / Self Care | Payer: Self-pay | Source: Home / Self Care

## 2012-02-22 DIAGNOSIS — E1165 Type 2 diabetes mellitus with hyperglycemia: Secondary | ICD-10-CM

## 2012-02-22 DIAGNOSIS — Z23 Encounter for immunization: Secondary | ICD-10-CM

## 2012-02-22 DIAGNOSIS — I10 Essential (primary) hypertension: Secondary | ICD-10-CM

## 2012-02-22 MED ORDER — GLUCOSE BLOOD VI STRP: ORAL_STRIP | Status: DC

## 2012-02-22 MED ORDER — INSULIN NPH (HUMAN) (ISOPHANE) 100 UNIT/ML ~~LOC~~ SUSP: 18.0000 [IU] | Freq: Two times a day (BID) | SUBCUTANEOUS | Status: DC

## 2012-02-22 MED ORDER — LISINOPRIL-HYDROCHLOROTHIAZIDE 20-25 MG PO TABS: 1.0000 | ORAL_TABLET | Freq: Every day | ORAL | Status: DC

## 2012-02-22 MED ORDER — INFLUENZA VIRUS VACC SPLIT PF IM SUSP
0.5000 mL | Freq: Once | INTRAMUSCULAR | Status: AC
  Administered 2012-02-22: 0.5 mL via INTRAMUSCULAR

## 2012-02-22 MED ORDER — HYDRALAZINE HCL 10 MG PO TABS: 10.0000 mg | ORAL_TABLET | Freq: Three times a day (TID) | ORAL | Status: DC

## 2012-02-22 NOTE — ED Notes (Signed)
Medication refill

## 2012-02-22 NOTE — ED Provider Notes (Signed)
History     CSN: [id number]  Arrival date & time 02/22/12  1026   First MD Initiated Contact with Patient 02/22/12 1055      Chief Complaint  Patient presents with  . Medication Refill     HPI 71 year old Hispanic female with history of hypertension, peripheral last disease, varicose veins, hypertension and hyperlipidemia who comes in for medication refill. History provided by her son. Patient was recently seen in family practice residency clinic and patient was trying to establish care there. Her last A1c was greater than 11 and she has been placed on Lantus insulin 40 units daily and aspart 10 units a times a day. She has now run out of her her pressure medication prescription. Patient also has been having difficulty buying amlodipine and the NovoLog as outpatient as the cost is very high. Patient denies any chest pain, palpitations, headache, dizziness, shortness of breath, abdominal pain, nausea, vomiting, bowel or urinary symptoms. She denies any tingling or numbness of the extremities. However does have blurry vision off and on. Past Medical History  Diagnosis Date  . Diabetes mellitus type II, uncontrolled   . Hypertension   . Peripheral vascular disease   . Leg pain     worse with prolonged standing  . Varicose veins   . Hyperlipidemia LDL goal < 100     Past Surgical History  Procedure Date  . Other surgical history     reports varicose vein procedure    No family history on file.  History  Substance Use Topics  . Smoking status: Never Smoker   . Smokeless tobacco: Never Used  . Alcohol Use: No    OB History    Grav Para Term Preterm Abortions TAB SAB Ect Mult Living                  Review of Systems As outlined in history of present illness Allergies  Review of patient's allergies indicates no known allergies.  Home Medications   Current Outpatient Rx  Name  Route  Sig  Dispense  Refill  . GLUCOSE BLOOD VI STRP      Check blood sugar every  morning and 2 hours after each meal. Record blood sugars.   100 each   12   . GLUCOSE BLOOD VI STRP      Use as instructed   100 each   12   . INSULIN ASPART 100 UNIT/ML [place] SOLN   Subcutaneous   Inject 10 Units into the skin 3 (three) times daily before meals.   3 pen   11   . INSULIN GLARGINE 100 UNIT/ML [place] SOLN   Subcutaneous   Inject 40 Units into the skin at bedtime.          . INSULIN ISOPHANE HUMAN 100 UNIT/ML [place] SUSP   Subcutaneous   Inject 18 Units into the skin 2 (two) times daily before a meal.   1 vial   12   . PRAVASTATIN SODIUM 40 MG PO TABS   Oral   Take 1 tablet (40 mg total) by mouth at bedtime.   30 tablet   11     BP 195/81  Pulse 64  Temp 98.1 F (36.7 C) (Oral)  Resp 20  SpO2 99%  Physical Exam Elderly female in no acute distress HEENT: No pallor, no icterus moist oral mucosa Chest: Clear to auscultation bilaterally, no added sounds CVS: Normal S1 and S2 no murmurs rub or gallop Abdomen: Soft, nontender, nondistended  Extremities: Warm, no edema CNS: AAO x3 ED Course  Procedures (including critical care time)  Labs Reviewed - No data to display No results found.   1. Diabetes mellitus type II, uncontrolled    patient is not able to afford Lantus and aspart has outpatient. I will prescribe her with NPH 18 units twice a day before meals. (The Relion NPH is available at a cheaper price at [facility] and she should be able to afford it. I will also prescribe her with the relion  test strips available at [facility] which she should be able to afford.) She is to keep log for her blood glucose monitoring and I have explained this to her son. Her A1c needs to be checked in 2 months. She will continue with her rather statin as outpatient.  #2 hypertension. Her pressure is quite elevated. Patient has run out of her prescription and has not been able to 40 amlodipine. Patient informs not taking the blood pressure medication for almost a month. I  will prescribe her with lisinopril-HCTZ 20-25 mg by mouth daily. I will also prescribe her hydralazine 10 mg 3 times a day which is again available cheap at [facility] and should be able to afford it.  Hyperlipidemia Continue pravastatin  Health maintenance will order a flu vaccine MDM    Patient is still established at family medicine residency clinic and I have spoken to the clinic already. I will prescribe her the nystatin medication today and she should continue followup there in one month.        Eddie North, MD 02/22/12 1158

## 2012-04-29 ENCOUNTER — Encounter (HOSPITAL_COMMUNITY): Payer: Self-pay

## 2012-04-29 ENCOUNTER — Emergency Department (HOSPITAL_COMMUNITY)
Admission: EM | Admit: 2012-04-29 | Discharge: 2012-04-29 | Disposition: A | Discharge status: Home / Self Care | Payer: No Typology Code available for payment source | Source: Home / Self Care | Attending: Family Medicine | Admitting: Family Medicine

## 2012-04-29 DIAGNOSIS — R079 Chest pain, unspecified: Secondary | ICD-10-CM

## 2012-04-29 DIAGNOSIS — E1169 Type 2 diabetes mellitus with other specified complication: Secondary | ICD-10-CM

## 2012-04-29 DIAGNOSIS — IMO0002 Reserved for concepts with insufficient information to code with codable children: Secondary | ICD-10-CM

## 2012-04-29 DIAGNOSIS — M25511 Pain in right shoulder: Secondary | ICD-10-CM

## 2012-04-29 DIAGNOSIS — M25512 Pain in left shoulder: Secondary | ICD-10-CM

## 2012-04-29 DIAGNOSIS — H539 Unspecified visual disturbance: Secondary | ICD-10-CM

## 2012-04-29 DIAGNOSIS — E1165 Type 2 diabetes mellitus with hyperglycemia: Secondary | ICD-10-CM

## 2012-04-29 DIAGNOSIS — E785 Hyperlipidemia, unspecified: Secondary | ICD-10-CM

## 2012-04-29 DIAGNOSIS — I10 Essential (primary) hypertension: Secondary | ICD-10-CM

## 2012-04-29 LAB — LIPID PANEL
HDL: 53 mg/dL (ref >39)
LDL Cholesterol: 156 mg/dL — ABNORMAL HIGH (ref 0–99)
Total CHOL/HDL Ratio: 4.9 RATIO
VLDL: 52 mg/dL — ABNORMAL HIGH (ref 0–40)

## 2012-04-29 LAB — COMPREHENSIVE METABOLIC PANEL
AST: 15 U/L (ref 0–37)
Alkaline Phosphatase: 94 U/L (ref 39–117)
CO2: 31 mEq/L (ref 19–32)
Chloride: 99 mEq/L (ref 96–112)
Creatinine, Ser: 0.69 mg/dL (ref 0.50–1.10)
GFR calc non Af Amer: 87 mL/min — ABNORMAL LOW (ref >90)
Potassium: 3.8 mEq/L (ref 3.5–5.1)
Total Bilirubin: 0.5 mg/dL (ref 0.3–1.2)

## 2012-04-29 LAB — GLUCOSE, CAPILLARY: Glucose-Capillary: 216 mg/dL — ABNORMAL HIGH (ref 70–99)

## 2012-04-29 LAB — HEMOGLOBIN A1C: Mean Plasma Glucose: 243 mg/dL — ABNORMAL HIGH (ref <117)

## 2012-04-29 LAB — TSH: TSH: 1.832 u[IU]/mL (ref 0.350–4.500)

## 2012-04-29 MED ORDER — INSULIN GLARGINE 100 UNIT/ML ~~LOC~~ SOLN: 40.0000 [IU] | Freq: Every day | SUBCUTANEOUS | Status: DC

## 2012-04-29 MED ORDER — INSULIN ASPART 100 UNIT/ML ~~LOC~~ SOLN: 10.0000 [IU] | Freq: Three times a day (TID) | SUBCUTANEOUS | Status: DC

## 2012-04-29 MED ORDER — ACETAMINOPHEN 500 MG PO TABS: 500.0000 mg | ORAL_TABLET | Freq: Four times a day (QID) | ORAL | Status: DC | PRN

## 2012-04-29 MED ORDER — METFORMIN HCL ER 500 MG PO TB24: ORAL_TABLET | ORAL | Status: DC

## 2012-04-29 NOTE — ED Provider Notes (Signed)
History   CSN: [id number]  Arrival date & time 04/29/12  1037   First MD Initiated Contact with Patient 04/29/12 1134     Chief Complaint  Patient presents with  . Abdominal Pain   HPI Comments: Very poor historian  The history is provided by the patient. The history is limited by a language barrier. A language interpreter was used.   Pt is reporting that she is having back pain.  This has been present for the past month.  She says that she is having trouble with her vision.   Pt has not been able to see an eye care specialist.  She is checking her blood sugar infrequently.   She is reporting that she checks once per day.  Her BS numbers are normally 195 but one day it was 162.  Pt is taking insulin.  Pt denies having blood in urination,  Pt is not having any burning with urination.    Past Medical History  Diagnosis Date  . Diabetes mellitus type II, uncontrolled   . Hypertension   . Peripheral vascular disease   . Leg pain     worse with prolonged standing  . Varicose veins   . Hyperlipidemia LDL goal < 100     Past Surgical History  Procedure Laterality Date  . Other surgical history      reports varicose vein procedure    No family history on file.  History  Substance Use Topics  . Smoking status: Never Smoker   . Smokeless tobacco: Never Used  . Alcohol Use: No    OB History   Grav Para Term Preterm Abortions TAB SAB Ect Mult Living                  Review of Systems  Gastrointestinal: Negative for abdominal pain.  Genitourinary: Positive for frequency.       Nocturia   Musculoskeletal: Positive for arthralgias.       Bilateral shoulder pains  All other systems reviewed and are negative.   Allergies  Review of patient's allergies indicates no known allergies.  Home Medications   Current Outpatient Rx  Name  Route  Sig  Dispense  Refill  . glucose blood test strip      Use as instructed   100 each   12   . hydrALAZINE (APRESOLINE) 10 MG  tablet   Oral   Take 1 tablet (10 mg total) by mouth 3 (three) times daily.   90 tablet   3   . insulin NPH (HUMULIN N,NOVOLIN N) 100 UNIT/ML injection   Subcutaneous   Inject 18 Units into the skin 2 (two) times daily before a meal.   1 vial   12   . lisinopril-hydrochlorothiazide (PRINZIDE,ZESTORETIC) 20-25 MG per tablet   Oral   Take 1 tablet by mouth daily.   30 tablet   3   . pravastatin (PRAVACHOL) 40 MG tablet   Oral   Take 1 tablet (40 mg total) by mouth at bedtime.   30 tablet   11     BP 138/63  Pulse 73  Temp(Src) 98.6 F (37 C) (Oral)  SpO2 98%  Physical Exam  Nursing note and vitals reviewed. Constitutional: She is oriented to person, place, and time. She appears well-developed and well-nourished. No distress.  HENT:  Head: Normocephalic and atraumatic.  Mouth/Throat: No oropharyngeal exudate.  Dry MM   Eyes: Conjunctivae and EOM are normal. Pupils are equal, round, and reactive to light.  Neck: Normal range of motion. Neck supple. No JVD present. No thyromegaly present.  Cardiovascular: Normal rate, regular rhythm and normal heart sounds.   Pulmonary/Chest: Effort normal and breath sounds normal. No respiratory distress. She has no wheezes. She has no rales. She exhibits no tenderness.  Abdominal: Soft. Bowel sounds are normal. She exhibits no distension and no mass. There is no tenderness. There is no rebound and no guarding.  Musculoskeletal: Normal range of motion. She exhibits no edema and no tenderness.  Lymphadenopathy:    She has no cervical adenopathy.  Neurological: She is alert and oriented to person, place, and time.  Skin: Skin is warm and dry. No rash noted. No erythema. No pallor.  Psychiatric: She has a normal mood and affect. Her behavior is normal. Judgment and thought content normal.    ED Course  Procedures (including critical care time)  Labs Reviewed - No data to display No results found.  No diagnosis found.  MDM   IMPRESSION  Bilateral shoulder pain   Chest pain  Uncontrolled diabetes mellitus, type 2, insulin requiring  Diabetic Dyslipidemia  Poor compliance   Visual problems   Hypertension, much better controlled now on current meds  RECOMMENDATIONS / PLAN Pt says that she has not taken any basal insulin for several months.  She has not been on her lantus since the [facility] facility closed.   Will refill her medications today for lantus and novolog.  Check labs today.  Pt has not had labs since Sept 2013.     Resume lantus 40 unit every evening and Novolog Flexpen 10 units TIDAC Pt reports that she is up to date on her flu vaccine.  (received in this clinic) Trial of metformin ER 500 mg - with instructions to titrate to 500mg  po bid after meals EKG reviewed:  No significant change from 11/2011 EKG Refer to eye care specialist  Hypoglycemia precautions discussed with patient through translator  FOLLOW UP 3 weeks   The patient was given clear instructions to go to ER or return to medical center if symptoms don't improve, worsen or new problems develop.  The patient verbalized understanding.  The patient was told to call to get lab results if they haven't heard anything in the next week.            Cleora Fleet, MD 04/29/12 1354

## 2012-04-29 NOTE — ED Notes (Signed)
Complains of generalized pain all over

## 2012-04-30 NOTE — Progress Notes (Signed)
Quick Note:  Please notify patient that her diabetes is uncontrolled as evidenced by an A1c of >10%. She is at high risk for acute and chronic complications of uncontrolled diabetes mellitus. Please take insulin as prescribed. Check blood sugars 4 times per day and call our office with blood glucose readings in 2 weeks. Follow up in 1 month for office visit to review diabetes care plan. Please let her know that her cholesterol levels are too high. She needs to make sure that she is taking her cholesterol medication everyday. She should be taking pravastatin. Recheck labs next month.   Rodney Langton, MD, CDE, FAAFP [facility] [place], [place]   ______

## 2012-05-01 NOTE — ED Notes (Signed)
Referral to opthalmologist faxed waiting on appt

## 2012-05-02 ENCOUNTER — Telehealth (HOSPITAL_COMMUNITY): Payer: Self-pay

## 2012-05-15 ENCOUNTER — Emergency Department (INDEPENDENT_AMBULATORY_CARE_PROVIDER_SITE_OTHER)
Admission: EM | Admit: 2012-05-15 | Discharge: 2012-05-15 | Disposition: A | Discharge status: Home / Self Care | Payer: No Typology Code available for payment source | Source: Home / Self Care

## 2012-05-15 ENCOUNTER — Encounter: Payer: Self-pay | Admitting: Family Medicine

## 2012-05-15 ENCOUNTER — Encounter (HOSPITAL_COMMUNITY): Payer: Self-pay | Admitting: *Deleted

## 2012-05-15 DIAGNOSIS — E785 Hyperlipidemia, unspecified: Secondary | ICD-10-CM

## 2012-05-15 MED ORDER — INSULIN NPH ISOPHANE & REGULAR (70-30) 100 UNIT/ML ~~LOC~~ SUSP: 15.0000 [IU] | Freq: Two times a day (BID) | SUBCUTANEOUS | Status: DC

## 2012-05-15 MED ORDER — INSULIN NPH ISOPHANE & REGULAR (70-30) 100 UNIT/ML ~~LOC~~ SUSP: 20.0000 [IU] | Freq: Two times a day (BID) | SUBCUTANEOUS | Status: DC

## 2012-05-15 NOTE — ED Notes (Signed)
Patient states that she can not afford insulin medication.

## 2012-05-15 NOTE — ED Notes (Signed)
Patient Demographics  Meghan Ford, is a 72 y.o. female  NGE:[id number]  MWN:[id number]  DOB - 02-Mar-1941  Chief Complaint  Patient presents with  . Medication Refill        Subjective:   Meghan Ford today has, No headache, No chest pain, No abdominal pain - No Nausea, No new weakness tingling or numbness, No Cough - SOB.   Objective:    Filed Vitals:   05/15/12 1208  BP: 153/65  Pulse: 63  Temp: 97.5 F (36.4 C)  TempSrc: Oral  Resp: 14  SpO2: 100%     Exam  Awake Alert, Oriented X 3, No new F.N deficits, Normal affect [place].AT,PERRAL Supple Neck,No JVD, No cervical lymphadenopathy appriciated.  Symmetrical Chest wall movement, Good air movement bilaterally, CTAB RRR,No Gallops,Rubs or new Murmurs, No Parasternal Heave +ve B.Sounds, Abd Soft, Non tender, No organomegaly appriciated, No rebound - guarding or rigidity. No Cyanosis, Clubbing or edema, No new Rash or bruise     Data Review   CBC No results found for this basename: WBC, HGB, HCT, PLT, MCV, MCH, MCHC, RDW, NEUTRABS, LYMPHSABS, MONOABS, EOSABS, BASOSABS, BANDABS, BANDSABD,  in the last 168 hours  Chemistries   No results found for this basename: NA, K, CL, CO2, GLUCOSE, BUN, CREATININE, GFRCGP, CALCIUM, MG, AST, ALT, ALKPHOS, BILITOT,  in the last 168 hours ------------------------------------------------------------------------------------------------------------------ No results found for this basename: HGBA1C,  in the last 72 hours ------------------------------------------------------------------------------------------------------------------ No results found for this basename: CHOL, HDL, LDLCALC, TRIG, CHOLHDL, LDLDIRECT,  in the last 72 hours ------------------------------------------------------------------------------------------------------------------ No results found for this basename: TSH, T4TOTAL, FREET3, T3FREE, THYROIDAB,  in the last 72  hours ------------------------------------------------------------------------------------------------------------------ No results found for this basename: VITAMINB12, FOLATE, FERRITIN, TIBC, IRON, RETICCTPCT,  in the last 72 hours  Coagulation profile  No results found for this basename: INR, PROTIME,  in the last 168 hours     Prior to Admission medications   Medication Sig Start Date End Date Taking? Authorizing Provider  acetaminophen (TYLENOL) 500 MG tablet Take 1 tablet (500 mg total) by mouth every 6 (six) hours as needed for pain. 04/29/12   Clanford Cyndie Mull, MD  glucose blood test strip Use as instructed 02/22/12   Nishant Dhungel, MD  hydrALAZINE (APRESOLINE) 10 MG tablet Take 1 tablet (10 mg total) by mouth 3 (three) times daily. 02/22/12   Nishant Dhungel, MD  insulin NPH-insulin regular (NOVOLIN 70/30) (70-30) 100 UNIT/ML injection Inject 15 Units into the skin 2 (two) times daily with a meal. Please provide one-month supply, provide patient syringes and needles. 05/15/12   Leroy Sea, MD  lisinopril-hydrochlorothiazide (PRINZIDE,ZESTORETIC) 20-25 MG per tablet Take 1 tablet by mouth daily. 02/22/12   Nishant Dhungel, MD  metFORMIN (GLUCOPHAGE XR) 500 MG 24 hr tablet Take 1 po daily after supper for 1 week, then take 1 po bid after meals  SPANISH INSTRUCTIONS 04/29/12   Clanford Cyndie Mull, MD  pravastatin (PRAVACHOL) 40 MG tablet Take 1 tablet (40 mg total) by mouth at bedtime. 02/03/12   Shelva Majestic, MD     Assessment & Plan   Should hear as she is unable to purchase her insulin and NovoLog insulin due to monetary problems. She takes 40 units of Lantus along with sliding scale.  Agent has been given supply of 70-30 insulin 20 twice a day, continue her Glucophage, have requested her to do Accu-Cheks 4 times a day and ring her logbook in 5 days. Will need further titration as needed.  Follow-up Information   Follow up with Primary care provider. Schedule an  appointment as soon as possible for a visit in 5 days. (Bring back your Accu-Chek logbook)        Leroy Sea M.D on 05/15/2012 at 12:24 PM   Leroy Sea, MD 05/15/12 1226

## 2012-05-20 ENCOUNTER — Emergency Department (INDEPENDENT_AMBULATORY_CARE_PROVIDER_SITE_OTHER)
Admission: EM | Admit: 2012-05-20 | Discharge: 2012-05-20 | Disposition: A | Discharge status: Home / Self Care | Payer: No Typology Code available for payment source | Source: Home / Self Care

## 2012-05-20 ENCOUNTER — Encounter (HOSPITAL_COMMUNITY): Payer: Self-pay | Admitting: *Deleted

## 2012-05-20 ENCOUNTER — Other Ambulatory Visit: Payer: Self-pay

## 2012-05-20 DIAGNOSIS — R002 Palpitations: Secondary | ICD-10-CM

## 2012-05-20 DIAGNOSIS — M549 Dorsalgia, unspecified: Secondary | ICD-10-CM

## 2012-05-20 MED ORDER — INSULIN ASPART 100 UNIT/ML FLEXPEN: 12.0000 [IU] | Freq: Two times a day (BID) | SUBCUTANEOUS | Status: DC

## 2012-05-20 MED ORDER — IBUPROFEN 600 MG PO TABS: 600.0000 mg | ORAL_TABLET | Freq: Four times a day (QID) | ORAL | Status: DC | PRN

## 2012-05-20 NOTE — ED Notes (Signed)
Patient presents with interpreter. Sates that she has a feeling of vibrations in her heart and pain in her upper back and shoulders.

## 2012-05-20 NOTE — ED Provider Notes (Addendum)
History     CSN: [id number]  Arrival date & time 05/20/12  1030   None     Chief Complaint  Patient presents with  . Palpitations    (Consider location/radiation/quality/duration/timing/severity/associated sxs/prior treatment) HPI Meghan Ford comes in today for evaluation of heart palpitations.  The palpitations are intermittent and are not associated with SOB or chest pain.  She denies prior heart problems.  Does have pain in her legs, and describes a restless legs type syndrome at night.  She has been checking her sugars and her numbers have been running in the low 200's.  No complaints of excessive thirst or urination.  She has had a cough.   Past Medical History  Diagnosis Date  . Diabetes mellitus type II, uncontrolled   . Hypertension   . Peripheral vascular disease   . Leg pain     worse with prolonged standing  . Varicose veins   . Hyperlipidemia LDL goal < 100     Past Surgical History  Procedure Laterality Date  . Other surgical history      reports varicose vein procedure    No family history on file.  History  Substance Use Topics  . Smoking status: Never Smoker   . Smokeless tobacco: Never Used  . Alcohol Use: No    OB History   Grav Para Term Preterm Abortions TAB SAB Ect Mult Living                  Review of Systems No fever or chills. No ears, nose or throat complaints. No chest pain. No shortness of breath. Mild cough. Positive for back pain and restless legs type symptoms at night. No excessive thirst or urination. Sugars have been running in the 200s.  Allergies  Review of patient's allergies indicates no known allergies.  Home Medications   Current Outpatient Rx  Name  Route  Sig  Dispense  Refill  . acetaminophen (TYLENOL) 500 MG tablet   Oral   Take 1 tablet (500 mg total) by mouth every 6 (six) hours as needed for pain.   30 tablet   0   . glucose blood test strip      Use as instructed   100 each   12   .  hydrALAZINE (APRESOLINE) 10 MG tablet   Oral   Take 1 tablet (10 mg total) by mouth 3 (three) times daily.   90 tablet   3   . insulin NPH-insulin regular (NOVOLIN 70/30) (70-30) 100 UNIT/ML injection   Subcutaneous   Inject 20 Units into the skin 2 (two) times daily with a meal. Please provide one-month supply, provide patient syringes and needles.   10 mL   12   . lisinopril-hydrochlorothiazide (PRINZIDE,ZESTORETIC) 20-25 MG per tablet   Oral   Take 1 tablet by mouth daily.   30 tablet   3   . metFORMIN (GLUCOPHAGE XR) 500 MG 24 hr tablet      Take 1 po daily after supper for 1 week, then take 1 po bid after meals  SPANISH INSTRUCTIONS   60 tablet   2   . pravastatin (PRAVACHOL) 40 MG tablet   Oral   Take 1 tablet (40 mg total) by mouth at bedtime.   30 tablet   11     BP 142/69  Pulse 68  Temp(Src) 97.6 F (36.4 C) (Oral)  Resp 14  SpO2 99%  Physical Exam General: No acute distress. HEENT: Normocephalic, atraumatic. PERRL, EOMI.  Oropharynx is clear. Neck: Supple, no thyromegaly, no lymphadenopathy, no jugular venous distention. Chest: Lungs clear to auscultation bilaterally with good air movement. Heart: Regular rate, and rhythm. No murmurs, rubs, or gallops. Abdomen: Soft, nontender, nondistended with normal active bowel sounds. Extremities: No clubbing, edema, or cyanosis. Skin: Warm and dry. No rashes. Psychiatric: Mood and affect normal.  ED Course  Procedures (including critical care time)  Labs Reviewed - No data to display No results found.  EKG:  Normal sinus rhythm with 70 beats per minute. Q waves noted in aVF.  No diagnosis found.    MDM  1. Palpitations: 12-lead EKG reviewed. No arrhythmia noted. No ST or T wave abnormalities. TSH was recently checked and within normal limits. Recommend outpatient referral to cardiology for Holter monitoring. 2. Back pain: We'll start ibuprofen 600 mg every 6 hours when necessary pain. I have given her  discharge instructions for back exercises. 3. Dyslipidemia: Continue statin therapy. Lipids last checked one month ago. 4. Hypertension: Continue lisinopril and hydrochlorothiazide. Blood pressure is reasonable today. 5. Type 2 diabetes: Patient's blood glucoses are suboptimal. Recommend increasing her NovoLog flex pen to 12 units twice a day from 10 units twice a day. Recheck hemoglobin A1c in 2 months. 6. Leg pain: Appears to be from varicosities and possible restless legs. Trial of Motrin which was given for her back pain.    Meghan Bun Rama, MD 05/20/12 1201  Meghan Bun Rama, MD 05/20/12 1204  Meghan Bun Rama, MD 05/20/12 1356

## 2012-05-21 ENCOUNTER — Ambulatory Visit (INDEPENDENT_AMBULATORY_CARE_PROVIDER_SITE_OTHER): Payer: Self-pay | Admitting: Cardiovascular Disease

## 2012-05-21 ENCOUNTER — Encounter: Payer: Self-pay | Admitting: Family Medicine

## 2012-05-21 ENCOUNTER — Encounter: Payer: Self-pay | Admitting: Cardiovascular Disease

## 2012-05-21 ENCOUNTER — Other Ambulatory Visit: Payer: Self-pay | Admitting: *Deleted

## 2012-05-21 VITALS — BP 170/102 | HR 70 | Wt 226.0 lb

## 2012-05-21 DIAGNOSIS — I10 Essential (primary) hypertension: Secondary | ICD-10-CM

## 2012-05-21 MED ORDER — CARVEDILOL 6.25 MG PO TABS: 6.2500 mg | ORAL_TABLET | Freq: Two times a day (BID) | ORAL | Status: DC

## 2012-05-21 NOTE — Assessment & Plan Note (Signed)
Long discussion with son and patient regarding poor control of her diabetes and risks to kidneys, eyes and nerves.  I am skeptical that this will improve

## 2012-05-21 NOTE — Assessment & Plan Note (Signed)
Add coreg 6.25 bid and f/u with Dr Laural Benes

## 2012-05-21 NOTE — Patient Instructions (Signed)
Your physician recommends that you schedule a follow-up appointment in:  AS NEEDED Your physician has recommended you make the following change in your medication: START TAKING CARVEDILOL 6.25 MG 1 TAB TWICE  DAILY   Your physician has requested that you have an echocardiogram. Echocardiography is a painless test that uses sound waves to create images of your heart. It provides your doctor with information about the size and shape of your heart and how well your heart's chambers and valves are working. This procedure takes approximately one hour. There are no restrictions for this procedure.

## 2012-05-21 NOTE — Assessment & Plan Note (Signed)
Benign related to statin.  Given poorly controlled DM, HTN and ECG with insignificatn q's in inferior leads will check echo to assess LVH, and EF

## 2012-05-21 NOTE — Addendum Note (Signed)
Addended by: Scherrie Bateman E on: 05/21/2012 10:27 AM   Modules accepted: Orders

## 2012-05-21 NOTE — Progress Notes (Signed)
Patient ID: Meghan Ford, female   DOB: 02/04/1941, 72 y.o.   MRN: [id number] 72 yo referred for palpitations  These are related to taking her pravastatin.  She has horribly controlled DM with A1c of over 10  TSH normal.  She is obese with no known history of CAD or CHF.  History via her son.  She takes her pravistatin with all her othr meds and not at night.  She claims to be compliant with meds but BS still over 200.  Some blurry vision and frequency.  She is sedentary and has poor diet. No chest pain PND or orthopnea. After she takes pravastatin gets sensation in her chest like a cell phone vibrating No syncope  ROS: Denies fever, malais, weight loss, blurry vision, decreased visual acuity, cough, sputum, SOB, hemoptysis, pleuritic pain, palpitaitons, heartburn, abdominal pain, melena, lower extremity edema, claudication, or rash.  All other systems reviewed and negative   General: Affect appropriate Obese hispanic female HEENT: normal Neck supple with no adenopathy JVP normal no bruits no thyromegaly Lungs clear with no wheezing and good diaphragmatic motion Heart:  S1/S2 no murmur,rub, gallop or click PMI normal Abdomen: benighn, BS positve, no tenderness, no AAA no bruit.  No HSM or HJR Distal pulses intact with no bruits No edema Neuro non-focal Skin warm and dry No muscular weakness  Medications Current Outpatient Prescriptions  Medication Sig Dispense Refill  . acetaminophen (TYLENOL) 500 MG tablet Take 1 tablet (500 mg total) by mouth every 6 (six) hours as needed for pain.  30 tablet  0  . glucose blood test strip Use as instructed  100 each  12  . ibuprofen (ADVIL,MOTRIN) 600 MG tablet Take 1 tablet (600 mg total) by mouth every 6 (six) hours as needed for pain (por dolor en espalda.  Toma con comida.).  30 tablet  0  . insulin aspart 100 unit/ml SOLN Inject 12 Units into the skin 2 (two) times daily.  1 pen  11  . lisinopril-hydrochlorothiazide  (PRINZIDE,ZESTORETIC) 20-25 MG per tablet Take 1 tablet by mouth daily.  30 tablet  3  . metFORMIN (GLUCOPHAGE XR) 500 MG 24 hr tablet Take 1 po daily after supper for 1 week, then take 1 po bid after meals  SPANISH INSTRUCTIONS  60 tablet  2  . pravastatin (PRAVACHOL) 40 MG tablet Take 1 tablet (40 mg total) by mouth at bedtime.  30 tablet  11  . [DISCONTINUED] hydrALAZINE (APRESOLINE) 10 MG tablet Take 1 tablet (10 mg total) by mouth 3 (three) times daily.  90 tablet  3  . [DISCONTINUED] insulin aspart (NOVOLOG FLEXPEN) 100 UNIT/ML injection Inject 10 Units into the skin 3 (three) times daily before meals.  3 mL  5  . [DISCONTINUED] insulin glargine (LANTUS SOLOSTAR) 100 UNIT/ML injection Inject 40 Units into the skin at bedtime.  10 mL  5  . [DISCONTINUED] insulin NPH (HUMULIN N,NOVOLIN N) 100 UNIT/ML injection Inject 18 Units into the skin 2 (two) times daily before a meal.  1 vial  12  . [DISCONTINUED] insulin NPH-insulin regular (NOVOLIN 70/30) (70-30) 100 UNIT/ML injection Inject 20 Units into the skin 2 (two) times daily with a meal. Please provide one-month supply, provide patient syringes and needles.  10 mL  12   No current facility-administered medications for this visit.    Allergies Review of patient's allergies indicates no known allergies.  Family History: No family history on file.  Social History: History   Social History  . Marital Status:  Married    Spouse Name: N/A    Number of Children: N/A  . Years of Education: N/A   Occupational History  . Not on file.   Social History Main Topics  . Smoking status: Never Smoker   . Smokeless tobacco: Never Used  . Alcohol Use: No  . Drug Use: No  . Sexually Active: Not on file   Other Topics Concern  . Not on file   Social History Narrative   Lives with son Meghan Ford who comes with her to majority of visits and helps coordinate care for her. He translates for her and has signed and patient signed a release for  this on 02/02/12.           Electrocardiogram:  Assessment and Plan

## 2012-05-21 NOTE — Assessment & Plan Note (Signed)
Pravastatin causing "vibrations"  Told her to take it at night before she goes to bed.  If this doesn't help should be changed to something like simvastatin

## 2012-05-28 ENCOUNTER — Other Ambulatory Visit (HOSPITAL_COMMUNITY): Payer: No Typology Code available for payment source

## 2012-06-04 ENCOUNTER — Ambulatory Visit (HOSPITAL_COMMUNITY): Payer: No Typology Code available for payment source | Attending: Cardiovascular Disease | Admitting: Radiology

## 2012-06-04 DIAGNOSIS — E669 Obesity, unspecified: Secondary | ICD-10-CM | POA: Insufficient documentation

## 2012-06-04 DIAGNOSIS — I10 Essential (primary) hypertension: Secondary | ICD-10-CM | POA: Insufficient documentation

## 2012-06-04 DIAGNOSIS — R002 Palpitations: Secondary | ICD-10-CM | POA: Insufficient documentation

## 2012-06-04 DIAGNOSIS — E785 Hyperlipidemia, unspecified: Secondary | ICD-10-CM | POA: Insufficient documentation

## 2012-06-04 NOTE — Progress Notes (Signed)
Echocardiogram performed.  

## 2012-10-15 ENCOUNTER — Encounter: Payer: Self-pay | Admitting: Internal Medicine

## 2012-10-15 ENCOUNTER — Ambulatory Visit: Payer: Self-pay | Attending: Family Medicine | Admitting: Internal Medicine

## 2012-10-15 VITALS — BP 180/78 | HR 70 | Temp 97.6°F | Ht 64.75 in | Wt 233.4 lb

## 2012-10-15 DIAGNOSIS — E119 Type 2 diabetes mellitus without complications: Secondary | ICD-10-CM | POA: Insufficient documentation

## 2012-10-15 DIAGNOSIS — E1165 Type 2 diabetes mellitus with hyperglycemia: Secondary | ICD-10-CM

## 2012-10-15 DIAGNOSIS — G5793 Unspecified mononeuropathy of bilateral lower limbs: Secondary | ICD-10-CM | POA: Insufficient documentation

## 2012-10-15 DIAGNOSIS — I10 Essential (primary) hypertension: Secondary | ICD-10-CM | POA: Insufficient documentation

## 2012-10-15 DIAGNOSIS — G609 Hereditary and idiopathic neuropathy, unspecified: Secondary | ICD-10-CM

## 2012-10-15 MED ORDER — GABAPENTIN 300 MG PO CAPS: 300.0000 mg | ORAL_CAPSULE | Freq: Three times a day (TID) | ORAL | Status: DC

## 2012-10-15 MED ORDER — LISINOPRIL-HYDROCHLOROTHIAZIDE 20-25 MG PO TABS: 1.0000 | ORAL_TABLET | Freq: Every day | ORAL | Status: DC

## 2012-10-15 MED ORDER — IBUPROFEN 600 MG PO TABS: 600.0000 mg | ORAL_TABLET | Freq: Four times a day (QID) | ORAL | Status: DC | PRN

## 2012-10-15 MED ORDER — GLUCOSE BLOOD VI STRP: ORAL_STRIP | Status: DC

## 2012-10-15 MED ORDER — METFORMIN HCL ER 750 MG PO TB24: 750.0000 mg | ORAL_TABLET | Freq: Two times a day (BID) | ORAL | Status: DC

## 2012-10-15 MED ORDER — CARVEDILOL 6.25 MG PO TABS: 6.2500 mg | ORAL_TABLET | Freq: Two times a day (BID) | ORAL | Status: DC

## 2012-10-15 MED ORDER — INSULIN ASPART 100 UNIT/ML FLEXPEN: 12.0000 [IU] | Freq: Two times a day (BID) | SUBCUTANEOUS | Status: DC

## 2012-10-15 MED ORDER — PRAVASTATIN SODIUM 40 MG PO TABS: 40.0000 mg | ORAL_TABLET | Freq: Every day | ORAL | Status: DC

## 2012-10-15 NOTE — Progress Notes (Signed)
Patient ID: Meghan Ford, female   DOB: 06-11-1940, 72 y.o.   MRN: [id number]  CC: To establish care and medication refill  HPI: Patient is a 72 years old woman who came to clinic today to establish medical care her to get all her medication refilled. She has not taken any of her medication in over 2 months because she ran out and has no primary care physician to prescribe. She has no specific complaint today except for general pain in both legs, burning in nature. No history of trauma, no joint swelling, no redness.  She has not seen an ophthalmologist in years, has not done physical for a long time. Her medical history include diabetes mellitus, hypertension, peripheral vascular disease, varicose veins, and hyperlipidemia. She's not compliant with medications. She denies any chest pain, no shortness of breath, no dizziness, no history of fall. No urinary symptoms.  No Known Allergies Past Medical History  Diagnosis Date  . Diabetes mellitus type II, uncontrolled   . Hypertension   . Peripheral vascular disease   . Leg pain     worse with prolonged standing  . Varicose veins   . Hyperlipidemia LDL goal < 100    Current Outpatient Prescriptions on File Prior to Visit  Medication Sig Dispense Refill  . acetaminophen (TYLENOL) 500 MG tablet Take 1 tablet (500 mg total) by mouth every 6 (six) hours as needed for pain.  30 tablet  0  . [DISCONTINUED] hydrALAZINE (APRESOLINE) 10 MG tablet Take 1 tablet (10 mg total) by mouth 3 (three) times daily.  90 tablet  3  . [DISCONTINUED] insulin glargine (LANTUS SOLOSTAR) 100 UNIT/ML injection Inject 40 Units into the skin at bedtime.  10 mL  5  . [DISCONTINUED] insulin NPH (HUMULIN N,NOVOLIN N) 100 UNIT/ML injection Inject 18 Units into the skin 2 (two) times daily before a meal.  1 vial  12  . [DISCONTINUED] insulin NPH-insulin regular (NOVOLIN 70/30) (70-30) 100 UNIT/ML injection Inject 20 Units into the skin 2 (two) times daily with a  meal. Please provide one-month supply, provide patient syringes and needles.  10 mL  12   No current facility-administered medications on file prior to visit.   Family History  Problem Relation Age of Onset  . Cancer Brother    History   Social History  . Marital Status: Married    Spouse Name: N/A    Number of Children: N/A  . Years of Education: N/A   Occupational History  . Not on file.   Social History Main Topics  . Smoking status: Never Smoker   . Smokeless tobacco: Never Used  . Alcohol Use: No  . Drug Use: No  . Sexually Active: Not on file   Other Topics Concern  . Not on file   Social History Narrative   Lives with son Bobbye Riggs who comes with her to majority of visits and helps coordinate care for her. He translates for her and has signed and patient signed a release for this on 02/02/12.           Review of Systems: Constitutional: Negative for fever, chills, diaphoresis, activity change, appetite change and fatigue. HENT: Negative for ear pain, nosebleeds, congestion, facial swelling, rhinorrhea, neck pain, neck stiffness and ear discharge.  Eyes: Negative for pain, discharge, redness, itching and visual disturbance. Respiratory: Negative for cough, choking, chest tightness, shortness of breath, wheezing and stridor.  Cardiovascular: Negative for chest pain, palpitations and leg swelling. Gastrointestinal: Negative for abdominal distention. Genitourinary:  Negative for dysuria, urgency, frequency, hematuria, flank pain, decreased urine volume, difficulty urinating and dyspareunia.  Musculoskeletal: Negative for back pain, joint swelling, arthralgias and gait problem. Neurological: Negative for dizziness, tremors, seizures, syncope, facial asymmetry, speech difficulty, weakness, light-headedness, numbness and headaches.  Hematological: Negative for adenopathy. Does not bruise/bleed easily. Psychiatric/Behavioral: Negative for hallucinations, behavioral  problems, confusion, dysphoric mood, decreased concentration and agitation.    Objective:   Filed Vitals:   10/15/12 1101  BP: 204/90  Pulse: 70  Temp: 97.6 F (36.4 C)    Physical Exam: Constitutional: Patient appears well-developed and well-nourished. No distress. Obese HENT: Normocephalic, atraumatic, External right and left ear normal. Oropharynx is clear and moist.  Eyes: Conjunctivae and EOM are normal. PERRLA, no scleral icterus. Neck: Normal ROM. Neck supple. No JVD. No tracheal deviation. No thyromegaly. CVS: RRR, S1/S2 +, no murmurs, no gallops, no carotid bruit.  Pulmonary: Effort and breath sounds normal, no stridor, rhonchi, wheezes, rales.  Abdominal: Soft. BS +,  no distension, tenderness, rebound or guarding.  Musculoskeletal: Normal range of motion. No edema and no tenderness.  Lymphadenopathy: No lymphadenopathy noted, cervical, inguinal or axillary Neuro: Alert. Normal reflexes, muscle tone coordination. No cranial nerve deficit. Skin: Skin is warm and dry. No rash noted. Not diaphoretic. No erythema. No pallor. Psychiatric: Normal mood and affect. Behavior, judgment, thought content normal.  Lab Results  Component Value Date   WBC 10.0 12/11/2011   HGB 14.8 12/11/2011   HCT 48.5* 12/11/2011   MCV 85.2 12/11/2011   PLT 172 02/09/2010   Lab Results  Component Value Date   CREATININE 0.69 04/29/2012   BUN 15 04/29/2012   NA 138 04/29/2012   K 3.8 04/29/2012   CL 99 04/29/2012   CO2 31 04/29/2012    Lab Results  Component Value Date   HGBA1C 10.1* 04/29/2012   Lipid Panel     Component Value Date/Time   CHOL 261* 04/29/2012 1210   TRIG 260* 04/29/2012 1210   HDL 53 04/29/2012 1210   CHOLHDL 4.9 04/29/2012 1210   VLDL 52* 04/29/2012 1210   LDLCALC 156* 04/29/2012 1210   Hemoglobin A1c today is 9.8 slightly better than before    Assessment and plan:   Patient Active Problem List   Diagnosis Date Noted  . Uncontrolled hypertension 10/15/2012  .  Neuropathic pain of both legs 10/15/2012  . Palpitations 05/21/2012  . Dyslipidemia 05/15/2012  . Hypertension   . Hyperlipidemia LDL goal < 100   . Diabetes mellitus type II, uncontrolled   . Varicose veins of lower extremities with other complications 11/07/2011   Plan: Labs today: Comprehensive metabolic panel, urinalysis, urine for microalbumin/protein, hemoglobin A1c, CBC Following medications were prescribed  Carvedilol 6.25 mg tablet by mouth twice a day for hypertension  Lisinopril-hydrochlorothiazide 20-25 mg tablet by mouth daily for hypertension  NovoLog insulin 12 units subcutaneous twice a day for diabetes  Metformin increased to 750 minute by mouth daily for diabetic  Pravastatin 40 mg tablet by mouth daily for dyslipidemia  Gabapentin 300 mg caps by mouth 3 times a day for neuropathic pain  Ibuprofen 600 mg by mouth every 6 hour when necessary for pain      Patient has been counseled for compliance with medication and followup Nutritional counseling and exercise counseling done today Return to clinic in one month for blood pressure check and blood sugar check  Felesha Escobar-Guevara was given clear instructions to go to ER or return to the clinic if symptoms don't improve,  worsen or new problems develop.  Greenley Escobar-Guevara verbalized understanding.  Mckinzie Saksa was told to call to get lab results if hasn't heard anything in the next week.    Interpreter was used to communicate directly with patient for the entire encounter including providing detailed patient instructions.   Jeanann Lewandowsky, MD [facility] [place], [place] [phone]   10/15/2012, 11:30 AM

## 2012-10-16 LAB — COMPLETE METABOLIC PANEL WITH GFR
ALT: 10 U/L (ref 0–35)
AST: 13 U/L (ref 0–37)
Albumin: 3.6 g/dL (ref 3.5–5.2)
Alkaline Phosphatase: 88 U/L (ref 39–117)
BUN: 12 mg/dL (ref 6–23)
Calcium: 8.9 mg/dL (ref 8.4–10.5)
Creat: 0.78 mg/dL (ref 0.50–1.10)
GFR, Est African American: 89 mL/min
Glucose, Bld: 305 mg/dL — ABNORMAL HIGH (ref 70–99)
Total Bilirubin: 0.5 mg/dL (ref 0.3–1.2)
Total Protein: 6.2 g/dL (ref 6.0–8.3)

## 2012-10-16 LAB — MICROALBUMIN / CREATININE URINE RATIO: Microalb Creat Ratio: 151.1 mg/g — ABNORMAL HIGH (ref 0.0–30.0)

## 2012-10-21 ENCOUNTER — Ambulatory Visit: Payer: Self-pay | Attending: Internal Medicine

## 2012-10-21 ENCOUNTER — Telehealth: Payer: Self-pay | Admitting: Internal Medicine

## 2012-10-21 NOTE — Telephone Encounter (Signed)
Pt says pharm is too expensive, would like all meds from 10/15/12 sent to health Dept.

## 2012-10-21 NOTE — Telephone Encounter (Signed)
Medication clarification Metformin 750mg  Health dept needs clarification as to dose and  How it should be taken Thank you

## 2012-11-15 ENCOUNTER — Encounter (HOSPITAL_COMMUNITY): Payer: Self-pay | Admitting: Emergency Medicine

## 2012-11-15 ENCOUNTER — Ambulatory Visit: Payer: Self-pay

## 2012-11-15 ENCOUNTER — Emergency Department (HOSPITAL_COMMUNITY)
Admission: EM | Admit: 2012-11-15 | Discharge: 2012-11-15 | Disposition: A | Discharge status: Home / Self Care | Payer: Self-pay | Attending: Emergency Medicine | Admitting: Emergency Medicine

## 2012-11-15 DIAGNOSIS — Z794 Long term (current) use of insulin: Secondary | ICD-10-CM | POA: Insufficient documentation

## 2012-11-15 DIAGNOSIS — I10 Essential (primary) hypertension: Secondary | ICD-10-CM | POA: Insufficient documentation

## 2012-11-15 DIAGNOSIS — Z79899 Other long term (current) drug therapy: Secondary | ICD-10-CM | POA: Insufficient documentation

## 2012-11-15 DIAGNOSIS — E785 Hyperlipidemia, unspecified: Secondary | ICD-10-CM | POA: Insufficient documentation

## 2012-11-15 DIAGNOSIS — L84 Corns and callosities: Secondary | ICD-10-CM | POA: Insufficient documentation

## 2012-11-15 DIAGNOSIS — B029 Zoster without complications: Secondary | ICD-10-CM | POA: Insufficient documentation

## 2012-11-15 DIAGNOSIS — Z8679 Personal history of other diseases of the circulatory system: Secondary | ICD-10-CM | POA: Insufficient documentation

## 2012-11-15 DIAGNOSIS — IMO0001 Reserved for inherently not codable concepts without codable children: Secondary | ICD-10-CM | POA: Insufficient documentation

## 2012-11-15 MED ORDER — HYDROCODONE-ACETAMINOPHEN 5-325 MG PO TABS: 1.0000 | ORAL_TABLET | Freq: Four times a day (QID) | ORAL | Status: DC | PRN

## 2012-11-15 MED ORDER — ACYCLOVIR 400 MG PO TABS: 800.0000 mg | ORAL_TABLET | Freq: Every day | ORAL | Status: DC

## 2012-11-15 MED ORDER — HYDROCORTISONE 1 % EX CREA
TOPICAL_CREAM | Freq: Once | CUTANEOUS | Status: DC
  Filled 2012-11-15: qty 28

## 2012-11-15 MED ORDER — OXYCODONE-ACETAMINOPHEN 5-325 MG PO TABS
1.0000 | ORAL_TABLET | Freq: Once | ORAL | Status: AC
  Administered 2012-11-15: 1 via ORAL
  Filled 2012-11-15: qty 1

## 2012-11-15 NOTE — ED Provider Notes (Signed)
Complains of of painful burning rash at right flank onset 5 days ago suggestive of  Shingles. History is obtained from [employer] using medical interpreter  Doug Sou, MD 11/15/12 2200

## 2012-11-15 NOTE — ED Notes (Signed)
Dr Shela Commons examined patient.

## 2012-11-15 NOTE — ED Notes (Signed)
Pt given instructions regarding infection control of her virus.  Verbalized understanding of all instructions.

## 2012-11-15 NOTE — ED Notes (Signed)
Pt. reports itchy rashes at left side of back onset Sunday .

## 2012-11-15 NOTE — ED Provider Notes (Signed)
CSN: [id number]     Arrival date & time 11/15/12  2034 History  This chart was scribed for non-physician practitioner Felicie Morn, NP working with Doug Sou, MD by Caryn Bee, ED Scribe and Greggory Stallion, ED scribe. This patient was seen in room TR10C/TR10C and the patient's care was started at 8:46 PM.    Chief Complaint  Patient presents with  . Rash   Patient is a 72 y.o. female presenting with rash. The history is provided by the patient. A language interpreter was used Technical sales engineer).  Rash Location: back. Quality: burning, itchiness, painful and redness   Pain details:    Quality:  Burning and itching   Severity:  Mild   Onset quality:  Gradual   Duration:  5 days   Timing:  Constant   Progression:  Unchanged Severity:  Moderate Onset quality:  Gradual Duration:  5 days Timing:  Constant Progression:  Unchanged Chronicity:  New Relieved by:  None tried Worsened by:  Nothing tried Ineffective treatments:  None tried  HPI Comments: Liv Rallis is a 72 y.o. female who presents to the Emergency Department complaining of an itching and burning rash on the left side of her back that started 5 days ago. Pt denies having chicken pox as a child. Pt also states she has a darkened area at the base of her right great toe on the plantar surface. She states that it is painful and the pain worsens when she walks. Pt denies any other associated symptoms.   Past Medical History  Diagnosis Date  . Diabetes mellitus type II, uncontrolled   . Hypertension   . Peripheral vascular disease   . Leg pain     worse with prolonged standing  . Varicose veins   . Hyperlipidemia LDL goal < 100    Past Surgical History  Procedure Laterality Date  . Other surgical history      reports varicose vein procedure   Family History  Problem Relation Age of Onset  . Cancer Brother    History  Substance Use Topics  . Smoking status: Never Smoker   . Smokeless tobacco: Never Used   . Alcohol Use: No   OB History   Grav Para Term Preterm Abortions TAB SAB Ect Mult Living                 Review of Systems  Skin: Positive for rash.  All other systems reviewed and are negative.    Allergies  Review of patient's allergies indicates no known allergies.  Home Medications   Current Outpatient Rx  Name  Route  Sig  Dispense  Refill  . acetaminophen (TYLENOL) 500 MG tablet   Oral   Take 1 tablet (500 mg total) by mouth every 6 (six) hours as needed for pain.   30 tablet   0   . carvedilol (COREG) 6.25 MG tablet   Oral   Take 1 tablet (6.25 mg total) by mouth 2 (two) times daily.   60 tablet   2   . gabapentin (NEURONTIN) 300 MG capsule   Oral   Take 1 capsule (300 mg total) by mouth 3 (three) times daily.   90 capsule   3   . glucose blood test strip      Use as instructed   100 each   12   . ibuprofen (ADVIL,MOTRIN) 600 MG tablet   Oral   Take 1 tablet (600 mg total) by mouth every 6 (six) hours as needed  for pain (por dolor en espalda.  Toma con comida.).   30 tablet   0   . insulin aspart (NOVOLOG) 100 unit/ml SOLN   Subcutaneous   Inject 12 Units into the skin 2 (two) times daily.   1 pen   2   . lisinopril-hydrochlorothiazide (PRINZIDE,ZESTORETIC) 20-25 MG per tablet   Oral   Take 1 tablet by mouth daily.   30 tablet   3   . metFORMIN (GLUCOPHAGE XR) 750 MG 24 hr tablet   Oral   Take 1 tablet (750 mg total) by mouth 2 (two) times daily. Take 1 po daily after supper for 1 week, then take 1 po bid after meals  SPANISH INSTRUCTIONS   60 tablet   2   . pravastatin (PRAVACHOL) 40 MG tablet   Oral   Take 1 tablet (40 mg total) by mouth at bedtime.   30 tablet   2    BP 190/91  Pulse 69  Temp(Src) 98.4 F (36.9 C) (Oral)  Resp 18  SpO2 96% Physical Exam  Nursing note and vitals reviewed. Constitutional: She is oriented to person, place, and time. She appears well-developed and well-nourished. No distress.  HENT:  Head:  Normocephalic and atraumatic.  Eyes: EOM are normal.  Neck: Neck supple. No tracheal deviation present.  Cardiovascular: Normal rate.   Pulmonary/Chest: Effort normal. No respiratory distress.  Musculoskeletal: Normal range of motion.  Neurological: She is alert and oriented to person, place, and time.  Skin: Skin is warm and dry. Rash noted.  Darkened area at the base of her right great toe on the plantar surface. Pruritic reddened vesicular rash to her left back.  Psychiatric: She has a normal mood and affect. Her behavior is normal.    ED Course  Procedures (including critical care time) DIAGNOSTIC STUDIES: Oxygen Saturation is 96% on room air, adequate by my interpretation.    COORDINATION OF CARE: 8:49 PM-Discussed treatment plan which includes antiviral and pain control with pt at bedside and pt agreed to plan.     Labs Review Labs Reviewed - No data to display Imaging Review No results found.  MDM   Herpes zoster.   I personally performed the services described in this documentation, which was scribed in my presence. The recorded information has been reviewed and is accurate.      Jimmye Norman, NP 11/22/12 [phone]

## 2012-11-25 NOTE — ED Provider Notes (Signed)
Medical screening examination/treatment/procedure(s) were conducted as a shared visit with non-physician practitioner(s) and myself.  I personally evaluated the patient during the encounter  Doug Sou, MD 11/25/12 [phone]

## 2012-12-01 ENCOUNTER — Emergency Department (HOSPITAL_COMMUNITY)
Admission: EM | Admit: 2012-12-01 | Discharge: 2012-12-01 | Disposition: A | Discharge status: Home / Self Care | Payer: Self-pay | Attending: Emergency Medicine | Admitting: Emergency Medicine

## 2012-12-01 ENCOUNTER — Encounter (HOSPITAL_COMMUNITY): Payer: Self-pay | Admitting: Emergency Medicine

## 2012-12-01 DIAGNOSIS — Z79899 Other long term (current) drug therapy: Secondary | ICD-10-CM | POA: Insufficient documentation

## 2012-12-01 DIAGNOSIS — I10 Essential (primary) hypertension: Secondary | ICD-10-CM | POA: Insufficient documentation

## 2012-12-01 DIAGNOSIS — R079 Chest pain, unspecified: Secondary | ICD-10-CM | POA: Insufficient documentation

## 2012-12-01 DIAGNOSIS — E119 Type 2 diabetes mellitus without complications: Secondary | ICD-10-CM | POA: Insufficient documentation

## 2012-12-01 DIAGNOSIS — E785 Hyperlipidemia, unspecified: Secondary | ICD-10-CM | POA: Insufficient documentation

## 2012-12-01 DIAGNOSIS — B029 Zoster without complications: Secondary | ICD-10-CM | POA: Insufficient documentation

## 2012-12-01 MED ORDER — OXYCODONE-ACETAMINOPHEN 5-325 MG PO TABS: 1.0000 | ORAL_TABLET | ORAL | Status: DC | PRN

## 2012-12-01 MED ORDER — OXYCODONE-ACETAMINOPHEN 5-325 MG PO TABS
1.0000 | ORAL_TABLET | Freq: Once | ORAL | Status: AC
  Administered 2012-12-01: 1 via ORAL
  Filled 2012-12-01: qty 1

## 2012-12-01 NOTE — ED Provider Notes (Signed)
Medical screening examination/treatment/procedure(s) were performed by non-physician practitioner and as supervising physician I was immediately available for consultation/collaboration.   Megan E Docherty, MD 12/01/12 1616 

## 2012-12-01 NOTE — ED Notes (Signed)
Pt c/o pain in back and chest x 2 weeks since being bitten by bug; pt sts some pain in left arm and is intermittent

## 2012-12-01 NOTE — ED Notes (Signed)
PA at bedside.

## 2012-12-01 NOTE — ED Provider Notes (Signed)
CSN: [id number]     Arrival date & time 12/01/12  1137 History   First MD Initiated Contact with Patient 12/01/12 1200     Chief Complaint  Patient presents with  . Back Pain  . Chest Pain   (Consider location/radiation/quality/duration/timing/severity/associated sxs/prior Treatment) HPI Meghan Ford is a 72 y.o. female presents to emergency department with complaint of pain to the left upper back radiating around her left ribs. Patient states that she thought that she was bit by a bug about 2 weeks ago and that's when her pain started. Patient is unsure what bug bit her. Patient states that since then she's had tenderness and pain to the left back and just in the last few days she has had pain radiating under her left breast. Patient denies any associated symptoms, she denies any cough, shortness of breath, chest pain or tightness. She states that back and skin under the breast is tender to palpation. Patient was seen here about a week and a half ago and diagnosed with herpes zoster. She has been taking acyclovir since then.   Past Medical History  Diagnosis Date  . Diabetes mellitus type II, uncontrolled   . Hypertension   . Peripheral vascular disease   . Leg pain     worse with prolonged standing  . Varicose veins   . Hyperlipidemia LDL goal < 100    Past Surgical History  Procedure Laterality Date  . Other surgical history      reports varicose vein procedure   Family History  Problem Relation Age of Onset  . Cancer Brother    History  Substance Use Topics  . Smoking status: Never Smoker   . Smokeless tobacco: Never Used  . Alcohol Use: No   OB History   Grav Para Term Preterm Abortions TAB SAB Ect Mult Living                 Review of Systems  Constitutional: Negative for fever and chills.  HENT: Negative for neck pain and neck stiffness.   Respiratory: Negative for cough, chest tightness and shortness of breath.   Cardiovascular: Negative for chest  pain, palpitations and leg swelling.  Gastrointestinal: Negative for nausea, vomiting and abdominal pain.  Genitourinary: Negative for dysuria, flank pain, vaginal bleeding, vaginal discharge, vaginal pain and pelvic pain.  Musculoskeletal: Positive for myalgias and back pain. Negative for arthralgias.  Skin: Positive for rash.  Neurological: Negative for dizziness, weakness and headaches.  All other systems reviewed and are negative.    Allergies  Review of patient's allergies indicates no known allergies.  Home Medications   Current Outpatient Rx  Name  Route  Sig  Dispense  Refill  . acetaminophen (TYLENOL) 500 MG tablet   Oral   Take 1 tablet (500 mg total) by mouth every 6 (six) hours as needed for pain.   30 tablet   0   . acyclovir (ZOVIRAX) 400 MG tablet   Oral   Take 2 tablets (800 mg total) by mouth 5 (five) times daily.   50 tablet   0   . carvedilol (COREG) 6.25 MG tablet   Oral   Take 1 tablet (6.25 mg total) by mouth 2 (two) times daily.   60 tablet   2   . gabapentin (NEURONTIN) 300 MG capsule   Oral   Take 300 mg by mouth 2 (two) times daily.         Marland Kitchen glucose blood test strip  Use as instructed   100 each   12   . HYDROcodone-acetaminophen (NORCO/VICODIN) 5-325 MG per tablet   Oral   Take 1 tablet by mouth every 6 (six) hours as needed for pain.   10 tablet   0   . ibuprofen (ADVIL,MOTRIN) 600 MG tablet   Oral   Take 1 tablet (600 mg total) by mouth every 6 (six) hours as needed for pain (por dolor en espalda.  Toma con comida.).   30 tablet   0   . insulin aspart (NOVOLOG) 100 unit/ml SOLN   Subcutaneous   Inject 12 Units into the skin 2 (two) times daily.   1 pen   2   . lisinopril-hydrochlorothiazide (PRINZIDE,ZESTORETIC) 20-25 MG per tablet   Oral   Take 1 tablet by mouth daily.   30 tablet   3   . metFORMIN (GLUCOPHAGE XR) 750 MG 24 hr tablet   Oral   Take 1 tablet (750 mg total) by mouth 2 (two) times daily. Take 1 po  daily after supper for 1 week, then take 1 po bid after meals  SPANISH INSTRUCTIONS   60 tablet   2   . pravastatin (PRAVACHOL) 40 MG tablet   Oral   Take 1 tablet (40 mg total) by mouth at bedtime.   30 tablet   2    BP 188/69  Pulse 57  Temp(Src) 98.4 F (36.9 C) (Oral)  Resp 22  SpO2 97% Physical Exam  Nursing note and vitals reviewed. Constitutional: She appears well-developed and well-nourished. No distress.  HENT:  Head: Normocephalic.  Eyes: Conjunctivae are normal.  Neck: Neck supple.  Cardiovascular: Normal rate, regular rhythm and normal heart sounds.   Pulmonary/Chest: Effort normal and breath sounds normal. No respiratory distress. She has no wheezes. She has no rales. She exhibits tenderness.  Abdominal: Soft. Bowel sounds are normal. She exhibits no distension. There is no tenderness. There is no rebound.  Musculoskeletal: She exhibits no edema.  Neurological: She is alert.  Skin: Skin is warm and dry.  Erythematous, scaly, papular rash to the upper left back, extending into left ribs under pt's breast. Rash is tender to palpation.   Psychiatric: She has a normal mood and affect. Her behavior is normal.    ED Course  Procedures (including critical care time) Labs Review Labs Reviewed - No data to display Imaging Review No results found.   Date: 12/01/2012  Rate: 58  Rhythm: sinus bradycardia  QRS Axis: normal  Intervals: normal  ST/T Wave abnormalities: nonspecific ST changes  Conduction Disutrbances:none  Narrative Interpretation:   Old EKG Reviewed: unchanged   MDM   1. Herpes zoster    Patient with rash and pain consistent with herpes zoster. Patient was just here week and a half ago and diagnosed with this pain. Patient is currently taking effect clear. It appears to me that she needs a better pain control. Patient is tearful in the room. Given percocet for pain. I have explained thoroughly and given several print outs in spanish about herpes  zoster. She will follow up with pcp.   Filed Vitals:   12/01/12 1139 12/01/12 1219 12/01/12 1337  BP: 185/71 188/69 182/53  Pulse: 57  68  Temp: 98.4 F (36.9 C)    TempSrc: Oral    Resp: 18 22 16   SpO2: 97% 97% 97%       Lottie Mussel, PA-C 12/01/12 1602

## 2014-08-04 ENCOUNTER — Emergency Department (HOSPITAL_COMMUNITY)
Admission: EM | Admit: 2014-08-04 | Discharge: 2014-08-04 | Disposition: A | Discharge status: Home / Self Care | Payer: Self-pay | Attending: Emergency Medicine | Admitting: Emergency Medicine

## 2014-08-04 ENCOUNTER — Encounter (HOSPITAL_COMMUNITY): Payer: Self-pay | Admitting: Cardiology

## 2014-08-04 ENCOUNTER — Emergency Department (HOSPITAL_COMMUNITY): Payer: Self-pay

## 2014-08-04 DIAGNOSIS — Y999 Unspecified external cause status: Secondary | ICD-10-CM | POA: Insufficient documentation

## 2014-08-04 DIAGNOSIS — R55 Syncope and collapse: Secondary | ICD-10-CM | POA: Insufficient documentation

## 2014-08-04 DIAGNOSIS — Y9301 Activity, walking, marching and hiking: Secondary | ICD-10-CM | POA: Insufficient documentation

## 2014-08-04 DIAGNOSIS — S42292A Other displaced fracture of upper end of left humerus, initial encounter for closed fracture: Secondary | ICD-10-CM | POA: Insufficient documentation

## 2014-08-04 DIAGNOSIS — W19XXXA Unspecified fall, initial encounter: Secondary | ICD-10-CM

## 2014-08-04 DIAGNOSIS — Y929 Unspecified place or not applicable: Secondary | ICD-10-CM | POA: Insufficient documentation

## 2014-08-04 DIAGNOSIS — S80212A Abrasion, left knee, initial encounter: Secondary | ICD-10-CM | POA: Insufficient documentation

## 2014-08-04 DIAGNOSIS — S8991XA Unspecified injury of right lower leg, initial encounter: Secondary | ICD-10-CM | POA: Insufficient documentation

## 2014-08-04 DIAGNOSIS — W1830XA Fall on same level, unspecified, initial encounter: Secondary | ICD-10-CM | POA: Insufficient documentation

## 2014-08-04 DIAGNOSIS — E119 Type 2 diabetes mellitus without complications: Secondary | ICD-10-CM | POA: Insufficient documentation

## 2014-08-04 HISTORY — DX: Type 2 diabetes mellitus without complications: E11.9

## 2014-08-04 LAB — CBC WITH DIFFERENTIAL/PLATELET
Basophils Absolute: 0 10*3/uL (ref 0.0–0.1)
Basophils Relative: 0 % (ref 0–1)
Eosinophils Absolute: 0.1 10*3/uL (ref 0.0–0.7)
Eosinophils Relative: 2 % (ref 0–5)
HCT: 47.9 % — ABNORMAL HIGH (ref 36.0–46.0)
HEMOGLOBIN: 15.5 g/dL — AB (ref 12.0–15.0)
LYMPHS PCT: 30 % (ref 12–46)
Lymphs Abs: 2.6 10*3/uL (ref 0.7–4.0)
MCH: 26.1 pg (ref 26.0–34.0)
MCHC: 32.4 g/dL (ref 30.0–36.0)
MCV: 80.5 fL (ref 78.0–100.0)
Monocytes Absolute: 0.4 10*3/uL (ref 0.1–1.0)
Monocytes Relative: 5 % (ref 3–12)
Neutro Abs: 5.7 10*3/uL (ref 1.7–7.7)
Neutrophils Relative %: 63 % (ref 43–77)
Platelets: 213 10*3/uL (ref 150–400)
RBC: 5.95 MIL/uL — ABNORMAL HIGH (ref 3.87–5.11)
RDW: 13.8 % (ref 11.5–15.5)
WBC: 8.9 10*3/uL (ref 4.0–10.5)

## 2014-08-04 LAB — BASIC METABOLIC PANEL
Anion gap: 11 (ref 5–15)
BUN: 9 mg/dL (ref 6–20)
CHLORIDE: 98 mmol/L — AB (ref 101–111)
CO2: 25 mmol/L (ref 22–32)
CREATININE: 0.64 mg/dL (ref 0.44–1.00)
Calcium: 8.8 mg/dL — ABNORMAL LOW (ref 8.9–10.3)
Glucose, Bld: 378 mg/dL — ABNORMAL HIGH (ref 65–99)
Potassium: 4 mmol/L (ref 3.5–5.1)
SODIUM: 134 mmol/L — AB (ref 135–145)

## 2014-08-04 LAB — I-STAT TROPONIN, ED: Troponin i, poc: 0 ng/mL (ref 0.00–0.08)

## 2014-08-04 LAB — URINALYSIS, ROUTINE W REFLEX MICROSCOPIC
BILIRUBIN URINE: NEGATIVE
Hgb urine dipstick: NEGATIVE
Ketones, ur: 40 mg/dL — AB
Leukocytes, UA: NEGATIVE
NITRITE: NEGATIVE
PH: 5.5 (ref 5.0–8.0)
PROTEIN: 100 mg/dL — AB
Specific Gravity, Urine: 1.043 — ABNORMAL HIGH (ref 1.005–1.030)
Urobilinogen, UA: 0.2 mg/dL (ref 0.0–1.0)

## 2014-08-04 LAB — URINE MICROSCOPIC-ADD ON

## 2014-08-04 LAB — CBG MONITORING, ED: GLUCOSE-CAPILLARY: 337 mg/dL — AB (ref 65–99)

## 2014-08-04 MED ORDER — HYDROCODONE-ACETAMINOPHEN 5-325 MG PO TABS: 1.0000 | ORAL_TABLET | ORAL | Status: DC | PRN

## 2014-08-04 MED ORDER — ONDANSETRON HCL 4 MG/2ML IJ SOLN
4.0000 mg | Freq: Once | INTRAMUSCULAR | Status: AC
  Administered 2014-08-04: 4 mg via INTRAVENOUS
  Filled 2014-08-04: qty 2

## 2014-08-04 MED ORDER — MORPHINE SULFATE 2 MG/ML IJ SOLN
2.0000 mg | Freq: Once | INTRAMUSCULAR | Status: AC
  Administered 2014-08-04: 2 mg via INTRAVENOUS
  Filled 2014-08-04: qty 1

## 2014-08-04 MED ORDER — MORPHINE SULFATE 4 MG/ML IJ SOLN
4.0000 mg | Freq: Once | INTRAMUSCULAR | Status: AC
  Administered 2014-08-04: 4 mg via INTRAVENOUS
  Filled 2014-08-04: qty 1

## 2014-08-04 NOTE — ED Notes (Signed)
PA at bedside.

## 2014-08-04 NOTE — ED Notes (Signed)
Pt and family refused interpreter, family member translating

## 2014-08-04 NOTE — ED Provider Notes (Signed)
CSN: [id number]     Arrival date & time 08/04/14  4098 History   First MD Initiated Contact with Patient 08/04/14 [phone]     Chief Complaint  Patient presents with  . Fall  . Loss of Consciousness  . Shoulder Pain   Meghan Ford is a 74 y.o. female with a history of diabetes who presents to the ED after a fall landing on her left shoulder. The patient reports she was walking outside when she fell. She does not remember the exact cause of her fall, but she woke up on the floor. She was able to get up off the floor and call her family.  She is complaining of 8 out of 10 left shoulder pain as well as bilateral knee pain. The patient reports she is unsure if she hit her head but she denies headache or changes to her vision. She denies recent falls. She denies taking any medications for her diabetes. The patient denies remembering any prodromal events leading up to the fall. She denies any neck or back pain. The patient denies fevers, headache, changes to her vision, chest pain, shortness of breath, nausea, vomiting, abdominal pain, numbness, tingling, weakness, cough or rashes.  (Consider location/radiation/quality/duration/timing/severity/associated sxs/prior Treatment) HPI  Past Medical History  Diagnosis Date  . Diabetes mellitus without complication    History reviewed. No pertinent past surgical history. History reviewed. No pertinent family history. History  Substance Use Topics  . Smoking status: Never Smoker   . Smokeless tobacco: Not on file  . Alcohol Use: No   OB History    No data available     Review of Systems  Constitutional: Negative for fever and chills.  HENT: Negative for congestion, ear pain, facial swelling and sore throat.   Eyes: Negative for pain and visual disturbance.  Respiratory: Negative for cough and shortness of breath.   Cardiovascular: Negative for chest pain.  Gastrointestinal: Negative for nausea, vomiting, abdominal pain and diarrhea.   Genitourinary: Negative for dysuria and hematuria.  Musculoskeletal: Negative for back pain, neck pain and neck stiffness.       Left shoulder pain. Bilateral knee pain.  Skin: Positive for wound. Negative for rash.  Neurological: Positive for syncope. Negative for dizziness, weakness, light-headedness, numbness and headaches.      Allergies  Review of patient's allergies indicates no known allergies.  Home Medications   Prior to Admission medications   Medication Sig Start Date End Date Taking? Authorizing Provider  ibuprofen (ADVIL,MOTRIN) 200 MG tablet Take 200 mg by mouth every 6 (six) hours as needed for fever or moderate pain.   Yes Historical Provider, MD  HYDROcodone-acetaminophen (NORCO/VICODIN) 5-325 MG per tablet Take 1 tablet by mouth every 4 (four) hours as needed. 08/04/14   Everlene Farrier, PA-C   BP 152/65 mmHg  Pulse 81  Temp(Src) 98.5 F (36.9 C) (Oral)  Resp 26  Ht 5\' 6"  (1.676 m)  Wt 227 lb (102.967 kg)  BMI 36.66 kg/m2  SpO2 94% Physical Exam  Constitutional: She is oriented to person, place, and time. She appears well-developed and well-nourished. No distress.  Nontoxic appearing.  HENT:  Head: Normocephalic and atraumatic.  Right Ear: External ear normal.  Left Ear: External ear normal.  Mouth/Throat: Oropharynx is clear and moist. No oropharyngeal exudate.  Eyes: Conjunctivae and EOM are normal. Pupils are equal, round, and reactive to light. Right eye exhibits no discharge. Left eye exhibits no discharge.  Neck: Normal range of motion. Neck supple. No JVD present. No  tracheal deviation present.  No midline neck tenderness.  Cardiovascular: Normal rate, regular rhythm, normal heart sounds and intact distal pulses.  Exam reveals no gallop and no friction rub.   No murmur heard. Bilateral radial pulses are intact. Bilateral posterior tibialis pulses are intact.  Pulmonary/Chest: Effort normal and breath sounds normal. No respiratory distress. She has no  wheezes. She has no rales. She exhibits no tenderness.  Lungs are clear to auscultation bilaterally.  Abdominal: Soft. She exhibits no distension. There is no tenderness.  Abdomen is soft and nontender to palpation.  Musculoskeletal:  Tenderness to palpation over the patient's left clavicle as well as her proximal left humerus. No left elbow, wrist or forearm tenderness. Tenderness over the patient's bilateral anterior knees with an abrasion over her left knee. No pelvic instability. No pain with manipulation of her legs. No lower extremity edema or tenderness.  Lymphadenopathy:    She has no cervical adenopathy.  Neurological: She is alert and oriented to person, place, and time. No cranial nerve deficit. Coordination normal.  Cranial nerves are intact bilaterally. Her EOMs are intact bilaterally. Patient's left radial, ulnar and median nerves are intact. Her sensation is intact or bilateral upper and lower extremities.  Skin: Skin is warm and dry. No rash noted. She is not diaphoretic. No erythema. No pallor.  Psychiatric: She has a normal mood and affect. Her behavior is normal.  Nursing note and vitals reviewed.   ED Course  Procedures (including critical care time) Labs Review Labs Reviewed  CBC WITH DIFFERENTIAL/PLATELET - Abnormal; Notable for the following:    RBC 5.95 (*)    Hemoglobin 15.5 (*)    HCT 47.9 (*)    All other components within normal limits  URINALYSIS, ROUTINE W REFLEX MICROSCOPIC - Abnormal; Notable for the following:    Specific Gravity, Urine 1.043 (*)    Glucose, UA >1000 (*)    Ketones, ur 40 (*)    Protein, ur 100 (*)    All other components within normal limits  BASIC METABOLIC PANEL - Abnormal; Notable for the following:    Sodium 134 (*)    Chloride 98 (*)    Glucose, Bld 378 (*)    Calcium 8.8 (*)    All other components within normal limits  URINE MICROSCOPIC-ADD ON - Abnormal; Notable for the following:    Squamous Epithelial / LPF FEW (*)     Casts HYALINE CASTS (*)    All other components within normal limits  CBG MONITORING, ED - Abnormal; Notable for the following:    Glucose-Capillary 337 (*)    All other components within normal limits  I-STAT TROPOININ, ED    Imaging Review Ct Head Wo Contrast  08/04/2014   CLINICAL DATA:  Syncopal episode with fall  EXAM: CT HEAD WITHOUT CONTRAST  CT CERVICAL SPINE WITHOUT CONTRAST  TECHNIQUE: Multidetector CT imaging of the head and cervical spine was performed following the standard protocol without intravenous contrast. Multiplanar CT image reconstructions of the cervical spine were also generated.  COMPARISON:  None.  FINDINGS: CT HEAD FINDINGS  There is mild diffuse atrophy. There is no intracranial mass, hemorrhage, extra-axial fluid collection, or midline shift. There is minimal small vessel disease in the centra semiovale bilaterally. Gray-white compartments elsewhere are normal. No acute infarct apparent. The bony calvarium appears intact. The mastoid air cells are clear. There is a small retention cyst in the posterior inferior right maxillary antrum.  CT CERVICAL SPINE FINDINGS  There is no fracture  or spondylolisthesis. Prevertebral soft tissues and predental space regions are normal. There is mild disc space narrowing C5-6. Other disc spaces appear unremarkable. There is facet hypertrophy at several levels bilaterally without nerve root edema or effacement. There is no appreciable disc extrusion or stenosis.  IMPRESSION: CT head: Mild atrophy with minimal small vessel disease. No intracranial mass, hemorrhage, or extra-axial fluid. No acute infarct.  CT cervical spine: Areas of osteoarthritic change. No fracture or spondylolisthesis.   Electronically Signed   By: Bretta Bang III M.D.   On: 08/04/2014 11:54   Ct Cervical Spine Wo Contrast  08/04/2014   CLINICAL DATA:  Syncopal episode with fall  EXAM: CT HEAD WITHOUT CONTRAST  CT CERVICAL SPINE WITHOUT CONTRAST  TECHNIQUE:  Multidetector CT imaging of the head and cervical spine was performed following the standard protocol without intravenous contrast. Multiplanar CT image reconstructions of the cervical spine were also generated.  COMPARISON:  None.  FINDINGS: CT HEAD FINDINGS  There is mild diffuse atrophy. There is no intracranial mass, hemorrhage, extra-axial fluid collection, or midline shift. There is minimal small vessel disease in the centra semiovale bilaterally. Gray-white compartments elsewhere are normal. No acute infarct apparent. The bony calvarium appears intact. The mastoid air cells are clear. There is a small retention cyst in the posterior inferior right maxillary antrum.  CT CERVICAL SPINE FINDINGS  There is no fracture or spondylolisthesis. Prevertebral soft tissues and predental space regions are normal. There is mild disc space narrowing C5-6. Other disc spaces appear unremarkable. There is facet hypertrophy at several levels bilaterally without nerve root edema or effacement. There is no appreciable disc extrusion or stenosis.  IMPRESSION: CT head: Mild atrophy with minimal small vessel disease. No intracranial mass, hemorrhage, or extra-axial fluid. No acute infarct.  CT cervical spine: Areas of osteoarthritic change. No fracture or spondylolisthesis.   Electronically Signed   By: Bretta Bang III M.D.   On: 08/04/2014 11:54   Dg Chest Port 1 View  08/04/2014   CLINICAL DATA:  Syncope, landed on left shoulder.  Left side pain.  EXAM: PORTABLE CHEST - 1 VIEW  COMPARISON:  None.  FINDINGS: Mild cardiomegaly. Lungs are clear. No effusions. No acute bony abnormality.  IMPRESSION: No active disease.   Electronically Signed   By: Charlett Nose M.D.   On: 08/04/2014 11:57   Dg Shoulder Left  08/04/2014   CLINICAL DATA:  Pain following fall  EXAM: LEFT SHOULDER - 2+ VIEW  COMPARISON:  None.  FINDINGS: Frontal and Y scapular images were obtained. There is a fracture through the proximal humeral metaphysis  with the shaft of the humerus displaced medially and anteriorly with respect to the humeral head. There is an extensively comminuted fracture of the humeral head with multiple slightly displaced fracture fragments throughout the humeral head, particularly laterally. There has been rotation of the humeral head within the joint. There is no frank dislocation. Bones are somewhat osteoporotic. There is moderate narrowing of the acromioclavicular joint.  IMPRESSION: Extensively comminuted fracture of the humeral head. There is displaced fracture of the proximal humeral metaphysis with the humeral shaft displaced medially and anteriorly with respect to the humeral head. The humeral head is rotated. No dislocation. Moderate osteoarthritic change. Bones osteoporotic.   Electronically Signed   By: Bretta Bang III M.D.   On: 08/04/2014 10:39   Dg Knee Complete 4 Views Left  08/04/2014   CLINICAL DATA:  Pain following fall  EXAM: LEFT KNEE - COMPLETE 4+ VIEW  COMPARISON:  None.  FINDINGS: Frontal, lateral, and bilateral oblique views were obtained. There is soft tissue edema overlying the patella with probable prepatellar hemorrhage. There is no joint effusion. No acute fracture or dislocation. There is moderate narrowing medially and laterally with mild narrowing of the patellofemoral joint. There is spurring in all compartments.  IMPRESSION: Probable prepatellar hematoma. No fracture or joint effusion. Moderate osteoarthritic change.   Electronically Signed   By: Bretta Bang III M.D.   On: 08/04/2014 10:40   Dg Knee Complete 4 Views Right  08/04/2014   CLINICAL DATA:  Pain following fall  EXAM: RIGHT KNEE - COMPLETE 4+ VIEW  COMPARISON:  None.  FINDINGS: Frontal, lateral, and bilateral oblique views were obtained. There is no fracture or dislocation. No joint effusion. There is moderate generalized joint space narrowing with spurring in all compartments.  IMPRESSION: Osteoarthritic change.  No fracture or  effusion.   Electronically Signed   By: Bretta Bang III M.D.   On: 08/04/2014 10:41   Dg Humerus Left  08/04/2014   CLINICAL DATA:  Acute left upper extremity pain after fall in bathroom this morning. Initial encounter.  EXAM: LEFT HUMERUS - 2+ VIEW  COMPARISON:  None.  FINDINGS: Severely displaced and possibly comminuted fracture is seen involving the proximal left humeral head and neck. This appears to be closed and posttraumatic. The distal fragment is displaced anteriorly. The more distal portions of the left humerus appear intact.  IMPRESSION: Severely displaced and possibly comminuted proximal left humeral head and neck fracture.   Electronically Signed   By: Lupita Raider, M.D.   On: 08/04/2014 10:41     EKG Interpretation   Date/Time:  Tuesday Aug 04 2014 09:13:36 EDT Ventricular Rate:  75 PR Interval:  146 QRS Duration: 92 QT Interval:  402 QTC Calculation: 448 R Axis:   -12 Text Interpretation:  Sinus rhythm with marked sinus arrhythmia Inferior  infarct , age undetermined Anterolateral infarct , age undetermined  Abnormal ECG No old tracing to compare Confirmed by CAMPOS  MD, KEVIN  ([id number]) on 08/04/2014 9:31:05 AM      Filed Vitals:   08/04/14 1436 08/04/14 1438 08/04/14 1500 08/04/14 1545  BP: 141/104  138/68 152/65  Pulse: 79 72 77 81  Temp:      TempSrc:      Resp:  23 19 26   Height:      Weight:      SpO2: 97% 99% 93% 94%     MDM   Meds given in ED:  Medications  morphine 2 MG/ML injection 2 mg (2 mg Intravenous Given 08/04/14 1046)  morphine 4 MG/ML injection 4 mg (4 mg Intravenous Given 08/04/14 1242)  ondansetron (ZOFRAN) injection 4 mg (4 mg Intravenous Given 08/04/14 1452)  morphine 4 MG/ML injection 4 mg (4 mg Intravenous Given 08/04/14 1541)    New Prescriptions   HYDROCODONE-ACETAMINOPHEN (NORCO/VICODIN) 5-325 MG PER TABLET    Take 1 tablet by mouth every 4 (four) hours as needed.    Final diagnoses:  Fall, initial encounter  Humeral head  fracture, left, closed, initial encounter   This is a 74 y.o. female with a history of diabetes who presents to the ED after a fall landing on her left shoulder. The patient reports she was walking outside when she fell. She does not remember the exact cause of her fall, but she woke up on the floor. She was able to get up off the floor and call her family.  She is complaining of 8 out of 10 left shoulder pain as well as bilateral knee pain.  On examination is afebrile and nontoxic appearing. She has tenderness to the anterior and lateral aspect of her left shoulder. She is neurovascularly intact. She has good left radial pulses. Her left median, radial and ulnar nerves are intact. She has prepatellar swelling to the anterior aspect of her left knee. She has mild tenderness to the bilateral anterior knees. There is no neck or back tenderness. She has no focal neural deficits. CT head is negative for acute intracranial abnormality. CT cervical spine is negative for fracture. Right knee x-ray is unremarkable. Left knee x-ray shows prepatellar hematoma with no fracture or joint effusion. Left humeral x-ray shows displaced and comminuted proximal left humeral fracture. Shoulder x-ray shows a left extensively comminuted fracture of the humeral head. There is no dislocation. Will place her in shoulder sling and have her follow-up with orthopedic surgeon Dr. Ophelia Charter. Patient's CBG is 337. She has a normal anion gap. She has normal hemoglobin. Her urine is negative for infection. Patient had good pain control with IV morphine in the emergency department. She was able to ambulate in the emergency department without difficulty. Patient tolerated water and ginger ale in the ED. At discharge patient reports her pain is well controlled. Will discharge with prescriptions for Vicodin. I advised family to use extreme caution with this medicine as it can make her unsteady on her feet. Patient stays at home with family reports they  will keep a close eye on her. I advised family to call and make an appointment with orthopedic surgeon Dr. Ophelia Charter. I advised the patient to follow-up with their primary care provider this week for further evaluation of her blood sugars. I advised the patient to return to the emergency department with new or worsening symptoms or new concerns. The patient verbalized understanding and agreement with plan.    This patient was discussed with and evaluated by Dr. Patria Mane who agrees with assessment and plan.   Everlene Farrier, PA-C 08/04/14 1617  Azalia Bilis, MD 08/04/14 [phone]

## 2014-08-04 NOTE — Discharge Instructions (Signed)
Fractura del hmero, tratada con inmovilizacin (Humerus Fracture, Treated with Immobilization) El hmero es el hueso grande que se encuentra en la parte superior del brazo. Usted ha sufrido una ruptura de un hueso (fractura) en el hmero. Estas fracturas se diagnostican fcilmente con radiografas. TRATAMIENTO Las fracturas simples que se curan sin peligro de conducir a una discapacidad, se tratan con la simple inmovilizacin. Inmovilizacin significa que deber usar un yeso, una frula o un cabestrillo. Usted ha sufrido una fractura que se curar bien con inmovilizacin. La fractura se curar simplemente colocando el hueso en una buena posicin hasta que est lo suficientemente estable como para comenzar con los ejercicios de amplitud de movimientos. No realice actividades que puedan lesionar an ms el brazo.  INSTRUCCIONES PARA EL CUIDADO DOMICILIARIO  Aplique hielo sobre la zona lesionada.  Ponga el hielo en una bolsa plstica.  Colquese una toalla entre la piel y la bolsa de hielo.  Deje el hielo durante 15 a 20 minutos, 3 a 4 veces por da.  Si tiene un yeso:  No trate de rascarse la piel por debajo del molde utilizando objetos filosos o puntiagudos.  Controle todos los Darden Restaurantsdas la piel de alrededor del yeso. Puede colocarse una locin en las zonas rojas o doloridas.  Mantenga el yeso seco y limpio.  Si tiene una frula:  sela del modo en que se lo indicaron.  Mantenga la tablilla seco y limpio.  Puede aflojar el elstico que rodea la tablilla si los dedos se entumecen, siente hormigueos, se enfran o se vuelven de color azul.  Si tiene un cabestrillo:  Use el cabestrillo del modo en que se lo indicaron.  No ejerza presin en ninguna parte del yeso o tablilla hasta que se haya endurecido.  Puede proteger el yeso o la tablilla durante el bao con una bolsa plstica. No los sumerja en el agua.  Utilice los medicamentos de venta libre o de prescripcin para Chief Technology Officerel dolor, Copywriter, advertisingel  malestar o la Pleasantdalefiebre, segn se lo indique el profesional que lo asiste.  Haga ejercicios con amplitud de movimientos, segn se lo haya indicado el profesional.  Realice el seguimiento segn las instrucciones que le ha dado el profesional que lo asiste. Esto es muy importante para evitar una lesin o discapacidad permanente y el dolor crnico. SOLICITE ATENCIN MDICA DE INMEDIATO SI:  La piel o las uas del brazo lesionado se vuelven azules o grises.  Siente el brazo fro o entumecido.  Aumenta el dolor en la zona de la herida.  Tiene problemas con los medicamentos que le recetaron. EST SEGURO QUE:   Comprende las instrucciones para el alta mdica.  Controlar su enfermedad.  Solicitar atencin mdica de inmediato segn las indicaciones. Document Released: 02/27/2005 Document Revised: 05/22/2011 [facility] Patient Information 2015 [place], [place]. This information is not intended to replace advice given to you by your health care provider. Make sure you discuss any questions you have with your health care provider. Prevencin de las cadas y seguridad en Advice workerel hogar  (Fall Prevention and Financial risk analystHome Safety) Las cadas causan lesiones y Futures traderpueden afectar a personas de todas las edades. Es posible utilizar medidas preventivas para disminuir significativamente la probabilidad de cadas. Hay medidas simples que pueden hacer de su hogar un lugar seguro y Automotive engineerevitar las cadas.  AL AIRE LIBRE   Repare las grietas y los bordes de aceras y Theme park managercalzadas.  Retire los Johnson & Johnsonumbrales altos.  Recorte los arbustos en el camino principal.  Coloque una buena iluminacin en el exterior.  Elimine las herramientas, piedras y escombros de los senderos.  Compruebe que los pasamanos no se rompan y estn bien sujetos. Debe haber pasamanos a ambos lados de las escaleras .  Limpie regularmente hojas, nieve y hielo.  Utilice arena o sal en los pasillos durante los meses de invierno.  En el garaje, limpiar la grasa o los  derrames de combustible. EN EL BAO   Instale luces de noche.  Instale barras de apoyo en el inodoro, en la baera y en la ducha.  Utilice alfombras o calcomanas antideslizantes en la baera o ducha.  Coloque un taburete de plstico antideslizante en la ducha para sentarse, si es necesario.  Mantenga los pisos limpios y seque toda el agua del suelo inmediatamente.  Elimine regularmente la acumulacin de jabn en la baera o la ducha.  Asegure las alfombras de bao con una cinta para alfombra doble faz.  Retire las alfombras y todo lo que pueda ser un un riesgo. HABITACIONES   Instale luces de noche.  Asegrese de que la luz de la mesita sea de fcil [place].  No utilice ropa de cama de gran tamao.  Mantenga un telfono junto a su cama.  Tenga una silla firme con apoyabrazos para usar cuando se viste.  Retire las alfombras y lo que pueda ser un riesgo de cadas. COCINA   Mantenga las manijas de las ollas y sartenes hacia el centro del horno. Use los quemadores de atrs siempre que sea posible.  Limpie los derrames rpidamente y de tiempo para el secado.  Evite caminar sobre pisos mojados.  Evite utensilios y cuchillos calientes .  Cambie de posicin los estantes para que no sean demasiado altos o bajos.  Coloque los objetos de uso comn en lugares de fcil acceso.  Si es necesario, use una escalera firme con una barra de apoyo.  Mantenga los cables elctricos fuera del camino.  No use cera para pisos o limpiadores que dejen los pisos resbaladizos. Si tiene que usar cera, utilice cera antideslizante.  Retire del piso las alfombras y todo lo que pueda ser un un riesgo. ESCALERAS   Nunca deje objetos en las escaleras.  Coloque pasamanos a ambos lados de las escaleras y selos. Arregle los pasamanos sueltos. Asegrese que los pasamanos en ambos lados de las escaleras sean tan largos como las escaleras.  Verifique que la alfombra est bien asegurada a las  escaleras. Repare rpidamente las alfombras desgastadas o sueltas .  Evite colocar alfombras en la parte superior o inferior de las escaleras, o asegure correctamente la alfombra con cinta adhesiva para alfombras para evitar el deslizamiento. Deshgase de alfombras, si es posible.  Pdale a un electricista que coloque un interruptor de la luz en la parte superior e inferior de las escaleras. OTROS CONSEJOS PARA LA PREVENCIN DE CADAS   Use zapatos de tacn bajo o con suela de goma que sostengan y [place]. Use zapatos cerrados.  Al utilizar una escalera de [place], asegrese de que est completamente abierta y [address] travesaos firmemente bloqueados. No suba a una escalera de mano estando cerrada. .  Aada pintura de contraste o cinta de colores a las barras de apoyo y Investment banker, operational en su casa. Coloque las tiras de contraste de Higher education careers adviser y el ltimo escaln.  Conozca y use los dispositivos de ayuda para la movilidad, segn sea necesario. Instalar un sistema de respuestas de Consulting civil engineer.  Prenda las luces para evitar las reas oscuras. Reponga inmediatamente las lamparillas elctricas que  se hayan quemado. Coloque interruptores de luz luminiscentes.  Disponga los muebles de tal modo que pueda crear caminos despejados. Deje los muebles siempre en Designer, jewellery.  Asegure firmemente las alfombras con cinta adhesiva de doble faz.  Elimine las superficies desparejas en los pisos.  Seleccione un diseo de alfombra que visualmente no oculte los bordes de las alfombras.  Tenga cuidado con las [employer] OTROS CONSEJOS DE SEGURIDAD PARA EL HOGAR   Seleccione una temperatura de 120 F (48,8 C) para el agua.  Tenga los nmeros de emergencia cerca del telfono.  Coloque detectores de humo en cada nivel de su casa y cerca de los dormitorios. Document Released: 06/06/2007 Document Revised: 08/29/2011 [facility] Patient Information 2015 [place], [place]. This information is not  intended to replace advice given to you by your health care provider. Make sure you discuss any questions you have with your health care provider. Uso de cabestrillo para el brazo (Arm Sling Use) El cabestrillo se [place] para:  Limitar el movimiento del brazo.  Darle mayor comodidad.  Sostener Cabin crew. Esta bien ajustado si:  El codo se apoya sobre la esquina inferior.  nicamente los dedos sobresalen en la abertura. La mueca debe quedar dentro y el cabestrillo debe sostenerla.  La correa pasa alrededor del hombro o el cuello para lograr el apoyo.  El brazo debe estar aproximadamente al mismo nivel que la mano, ligeramente ms arriba que el codo. CUIDADOS EN EL HOGAR   Ajuste el cabestrillo de forma tal que la mano quede [place]. Los cabestrillos tienden a Personal assistant, haciendo que el codo apunte [place]. Coloque el codo Interior and spatial designer.  Los dedos deben sentirse tibios y Warehouse manager un color normal.  Intente mantener la palma de la mano apuntando hacia el cuerpo mientras utilice el cabestrillo.  Dorena Cookey al ir a dormir si el mdico lo autoriza.  Utilice una almohada adicional durante la noche para proteger el brazo. Deslice el brazo entre la almohada y la funda.  Tome baos o duchas segn le haya indicado el mdico.  Slo tome los medicamentos que le haya indicado el profesional. SOLICITE AYUDA DE INMEDIATO SI:   Tiene los dedos fros o siente hormigueo.  El dolor en el brazo [place].  El dolor no se [place] con medicamentos o ajustando el cabestrillo. ASEGRESE DE QUE:   Comprende estas instrucciones.  Controlar la enfermedad.  Solicitar ayuda de inmediato si usted o el nio no mejora o si empeora. Document Released: 04/01/2010 Document Revised: 05/22/2011 [facility] Patient Information 2015 [place], [place]. This information is not intended to replace advice given to you by your health care provider. Make sure you discuss any questions you have with your health care  provider.

## 2014-08-04 NOTE — ED Notes (Signed)
Pt reports she got up to go to the bathroom this morning and passed out. Pt landed on the left shoulder and is having severe pain on that side. Pt reports she heard a pop when she landed.

## 2014-08-04 NOTE — ED Notes (Signed)
Patient transported to X-ray 

## 2014-08-04 NOTE — ED Notes (Signed)
Signature pad not functioning in room.  Pt and family verbalized understanding of discharge orders.  Questions answered by PA.

## 2014-08-04 NOTE — ED Notes (Signed)
Checked cbg it was [id number] notified RN of high blood sugar

## 2014-09-27 ENCOUNTER — Encounter (HOSPITAL_COMMUNITY): Payer: Self-pay | Admitting: Emergency Medicine

## 2014-09-27 ENCOUNTER — Inpatient Hospital Stay (HOSPITAL_COMMUNITY)
Admission: EM | Admit: 2014-09-27 | Discharge: 2014-10-01 | DRG: 871 | Disposition: A | Discharge status: Home / Self Care | Payer: Medicaid Other | Attending: Neurology | Admitting: Neurology

## 2014-09-27 DIAGNOSIS — E43 Unspecified severe protein-calorie malnutrition: Secondary | ICD-10-CM | POA: Insufficient documentation

## 2014-09-27 DIAGNOSIS — E1140 Type 2 diabetes mellitus with diabetic neuropathy, unspecified: Secondary | ICD-10-CM

## 2014-09-27 DIAGNOSIS — R6883 Chills (without fever): Secondary | ICD-10-CM

## 2014-09-27 DIAGNOSIS — R6520 Severe sepsis without septic shock: Secondary | ICD-10-CM

## 2014-09-27 DIAGNOSIS — S42213A Unspecified displaced fracture of surgical neck of unspecified humerus, initial encounter for closed fracture: Secondary | ICD-10-CM | POA: Diagnosis present

## 2014-09-27 DIAGNOSIS — Z794 Long term (current) use of insulin: Secondary | ICD-10-CM

## 2014-09-27 DIAGNOSIS — R7989 Other specified abnormal findings of blood chemistry: Secondary | ICD-10-CM

## 2014-09-27 DIAGNOSIS — R519 Headache, unspecified: Secondary | ICD-10-CM | POA: Diagnosis present

## 2014-09-27 DIAGNOSIS — R52 Pain, unspecified: Secondary | ICD-10-CM

## 2014-09-27 DIAGNOSIS — R51 Headache: Secondary | ICD-10-CM

## 2014-09-27 DIAGNOSIS — R509 Fever, unspecified: Secondary | ICD-10-CM | POA: Insufficient documentation

## 2014-09-27 DIAGNOSIS — S42212A Unspecified displaced fracture of surgical neck of left humerus, initial encounter for closed fracture: Secondary | ICD-10-CM

## 2014-09-27 DIAGNOSIS — I10 Essential (primary) hypertension: Secondary | ICD-10-CM | POA: Diagnosis present

## 2014-09-27 DIAGNOSIS — A4151 Sepsis due to Escherichia coli [E. coli]: Principal | ICD-10-CM | POA: Diagnosis present

## 2014-09-27 DIAGNOSIS — S42202K Unspecified fracture of upper end of left humerus, subsequent encounter for fracture with nonunion: Secondary | ICD-10-CM

## 2014-09-27 DIAGNOSIS — E119 Type 2 diabetes mellitus without complications: Secondary | ICD-10-CM | POA: Diagnosis present

## 2014-09-27 DIAGNOSIS — S42302D Unspecified fracture of shaft of humerus, left arm, subsequent encounter for fracture with routine healing: Secondary | ICD-10-CM

## 2014-09-27 DIAGNOSIS — A419 Sepsis, unspecified organism: Secondary | ICD-10-CM | POA: Diagnosis present

## 2014-09-27 DIAGNOSIS — Z6833 Body mass index (BMI) 33.0-33.9, adult: Secondary | ICD-10-CM

## 2014-09-27 DIAGNOSIS — W19XXXD Unspecified fall, subsequent encounter: Secondary | ICD-10-CM | POA: Diagnosis present

## 2014-09-27 DIAGNOSIS — W57XXXA Bitten or stung by nonvenomous insect and other nonvenomous arthropods, initial encounter: Secondary | ICD-10-CM | POA: Diagnosis present

## 2014-09-27 HISTORY — DX: Unspecified displaced fracture of surgical neck of unspecified humerus, initial encounter for closed fracture: S42.213A

## 2014-09-27 LAB — CBC WITH DIFFERENTIAL/PLATELET
Basophils Absolute: 0 10*3/uL (ref 0.0–0.1)
Basophils Relative: 0 % (ref 0–1)
Eosinophils Absolute: 0.1 10*3/uL (ref 0.0–0.7)
Eosinophils Relative: 1 % (ref 0–5)
HCT: 43.8 % (ref 36.0–46.0)
Hemoglobin: 14.1 g/dL (ref 12.0–15.0)
Lymphocytes Relative: 12 % (ref 12–46)
Lymphs Abs: 1.2 10*3/uL (ref 0.7–4.0)
MCH: 26 pg (ref 26.0–34.0)
MCHC: 32.2 g/dL (ref 30.0–36.0)
MCV: 80.7 fL (ref 78.0–100.0)
Monocytes Absolute: 0.3 10*3/uL (ref 0.1–1.0)
Monocytes Relative: 3 % (ref 3–12)
NEUTROS ABS: 8.2 10*3/uL — AB (ref 1.7–7.7)
Neutrophils Relative %: 84 % — ABNORMAL HIGH (ref 43–77)
Platelets: 195 10*3/uL (ref 150–400)
RBC: 5.43 MIL/uL — ABNORMAL HIGH (ref 3.87–5.11)
RDW: 13.8 % (ref 11.5–15.5)
WBC: 9.7 10*3/uL (ref 4.0–10.5)

## 2014-09-27 LAB — I-STAT CG4 LACTIC ACID, ED: Lactic Acid, Venous: 2.51 mmol/L (ref 0.5–2.0)

## 2014-09-27 LAB — I-STAT TROPONIN, ED: Troponin i, poc: 0.01 ng/mL (ref 0.00–0.08)

## 2014-09-27 MED ORDER — ONDANSETRON HCL 4 MG/2ML IJ SOLN
4.0000 mg | Freq: Once | INTRAMUSCULAR | Status: AC
  Administered 2014-09-27: 4 mg via INTRAVENOUS
  Filled 2014-09-27: qty 2

## 2014-09-27 MED ORDER — MORPHINE SULFATE 4 MG/ML IJ SOLN
4.0000 mg | Freq: Once | INTRAMUSCULAR | Status: AC
Start: 2014-09-27 — End: 2014-09-27
  Administered 2014-09-27: 4 mg via INTRAVENOUS
  Filled 2014-09-27: qty 1

## 2014-09-27 MED ORDER — SODIUM CHLORIDE 0.9 % IV SOLN
INTRAVENOUS | Status: DC
  Administered 2014-09-27: via INTRAVENOUS

## 2014-09-27 NOTE — ED Notes (Signed)
Patient here with complaint of left arm pain which began today. States recent history of fracture in that arm. Currently no splint or cast in place, but arm is in sling at this time. Pulse intact at radius. Arm appears similar other, no redness or swelling apparent. Patient states she has been having chills for several days. Normothermic in triage.

## 2014-09-27 NOTE — ED Provider Notes (Signed)
This chart was scribed for [facility] Letticia Bhattacharyya, DO by Bethel BornBritney McCollum, ED Scribe. This patient was seen in room B19C/B19C and the patient's care was started at 11:12 PM.  TIME SEEN: 11:12 PM   CHIEF COMPLAINT: left arm pain  HPI: Meghan FiddlerMarzelino Ford is a 74 y.o. female who presents to the Emergency Department complaining of increased left arm pain secondary to a fracture in May 2016. The pain worsened today and she rates it 10/10 in severity. Oxycodone provided insufficient pain relief PTA. She is right hand dominant. [facility] and has decided to postpone surgery due to a fear of anaesthesia. No new injury to the arm. Currently in a sling. Also complaining of left leg pain that she has had since her fall in May.  Also complains of chills, with posterior headache that started yesterday. The headache is similar to pain that she had years ago. Pt denies known fever, cough, vomiting, diarrhea, chest pain, and SOB.  No recent head injury. No anticoagulation.   Pt is spanish-speaking only. Relatives are at the bedside translating.   ROS: See HPI Constitutional: no fever  Eyes: no drainage  ENT: no runny nose   Cardiovascular:  no chest pain  Resp: no SOB  GI: no vomiting GU: no dysuria Integumentary: no rash  Allergy: no hives  Musculoskeletal: no leg swelling  Neurological: no slurred speech ROS otherwise negative  PAST MEDICAL HISTORY/PAST SURGICAL HISTORY:  Past Medical History  Diagnosis Date  . Diabetes mellitus without complication     MEDICATIONS:  Prior to Admission medications   Medication Sig Start Date End Date Taking? Authorizing Provider  HYDROcodone-acetaminophen (NORCO/VICODIN) 5-325 MG per tablet Take 1 tablet by mouth every 4 (four) hours as needed. 08/04/14   Everlene FarrierWilliam Dansie, PA-C  ibuprofen (ADVIL,MOTRIN) 200 MG tablet Take 200 mg by mouth every 6 (six) hours as needed for fever or moderate pain.    Historical Provider, MD    ALLERGIES:  No  Known Allergies  SOCIAL HISTORY:  History  Substance Use Topics  . Smoking status: Never Smoker   . Smokeless tobacco: Not on file  . Alcohol Use: No    FAMILY HISTORY: History reviewed. No pertinent family history.  EXAM: BP 172/83 mmHg  Pulse 93  Temp(Src) 98.7 F (37.1 C) (Oral)  Resp 18  SpO2 99% CONSTITUTIONAL: Alert and oriented and responds appropriately to questions. Appears uncomfortable, elderly, chronically ill-appearing HEAD: Normocephalic EYES: Conjunctivae clear, PERRL ENT: normal nose; no rhinorrhea; moist mucous membranes; pharynx without lesions noted NECK: Supple, no meningismus, no LAD  CARD: Regular and tachycardic; S1 and S2 appreciated; no murmurs, no clicks, no rubs, no gallops RESP: Normal chest excursion without splinting or tachypnea; breath sounds clear and equal bilaterally; no wheezes, no rhonchi, no rales, no hypoxia or respiratory distress ABD/GI: Normal bowel sounds; non-distended; soft, non-tender, no rebound, no guarding BACK:  The back appears normal and is non-tender to palpation, there is no CVA tenderness EXT: Patient is tender to palpation over the left humerus without erythema or warmth, 2+ radial pulses bilaterally, decreased range of motion showing secondary to pain, otherwise Normal ROM in all joints; otherwise extremity is are non-tender to palpation; no edema; normal capillary refill; no cyanosis    SKIN: Normal color for age and race; warm, no rash NEURO: Moves all extremities equally PSYCH: The patient's mood and manner are appropriate. Grooming and personal hygiene are appropriate.  MEDICAL DECISION MAKING: Patient here with complaints of chills  that started yesterday, not feeling well. She does have a posterior headache but reports she has had this in the past. No meningismus on exam. Family is also concerned that her left arm pain has been controlled today. No signs of septic arthritis on exam. No history of any new injury. She is  being followed by orthopedics at [facility] and has decided on medical management over surgical intervention. Rectal temperature here is 102. Will obtain labs, cultures. Given she is mildly tachycardic, febrile. We'll start broad-spectrum antibiotics as she meets SIRS critieria.  ED PROGRESS:   Patient's labs show elevated lactate. No leukocytosis. Urine shows no sign of infection and chest x-ray is clear. Cultures are pending. She has received vancomycin and Zosyn. Family agrees on admission. Pain has been controlled after morphine. X-ray of her left humerus shows no new injury. Will discuss with medicine for admission.  1:13 AM-Consult complete with Dr. Clyde Lundborg. Patient case explained and discussed. Dr. Clyde Lundborg agrees to admit patient for further evaluation and treatment. He requests inpatient admission with telemetry and an emergent ortho consult be performed tonight to rule out septic arthritis as the source of sepsis.  At this time I do not feel clinically there is a septic arthritis on patient's exam.  1:20 AM Consult complete with Dr. Renaye Rakers (Ortho) who will see the pt in the morning. Discussed with Dr. Eulah Pont that I did not feel clinically the patient had septic arthritis but that the hospitalist requested consult tonight.  3:00 AM  Dr. Eulah Pont called back to state patient has seen Dr. Ophelia Charter with [facility] orthopedics and they would need to be contacted in the morning. Discussed this with hospitalist Dr. Clyde Lundborg.  Family only reported that they had seen a physician at [facility] and had not seen any orthopedist and [facility].  No note in EPIC that patient has seen [facility].  Patient is no longer in the emergency department.     EKG Interpretation  Date/Time:  Sunday September 27 2014 23:24:43 EDT Ventricular Rate:  98 PR Interval:  145 QRS Duration: 87 QT Interval:  346 QTC Calculation: 442 R Axis:   -8 Text Interpretation:  Sinus rhythm Inferior infarct, old No significant change since last tracing  Confirmed by Saphyre Cillo,  DO, Karmah Potocki ([id number]) on 09/28/2014 12:39:02 AM        CRITICAL CARE Performed by: Raelyn Number   Total critical care time: 35 minutes  Critical care time was exclusive of separately billable procedures and treating other patients.  Critical care was necessary to treat or prevent imminent or life-threatening deterioration.  Critical care was time spent personally by me on the following activities: development of treatment plan with patient and/or surrogate as well as nursing, discussions with consultants, evaluation of patient's response to treatment, examination of patient, obtaining history from patient or surrogate, ordering and performing treatments and interventions, ordering and review of laboratory studies, ordering and review of radiographic studies, pulse oximetry and re-evaluation of patient's condition.   I personally performed the services described in this documentation, which was scribed in my presence. The recorded information has been reviewed and is accurate.    Layla Maw Melisha Eggleton, DO 09/28/14 [phone]

## 2014-09-28 ENCOUNTER — Emergency Department (HOSPITAL_COMMUNITY): Payer: Medicaid Other

## 2014-09-28 ENCOUNTER — Encounter (HOSPITAL_COMMUNITY): Payer: Self-pay | Admitting: Internal Medicine

## 2014-09-28 DIAGNOSIS — A4151 Sepsis due to Escherichia coli [E. coli]: Secondary | ICD-10-CM | POA: Diagnosis present

## 2014-09-28 DIAGNOSIS — R519 Headache, unspecified: Secondary | ICD-10-CM | POA: Diagnosis present

## 2014-09-28 DIAGNOSIS — R51 Headache: Secondary | ICD-10-CM

## 2014-09-28 DIAGNOSIS — Z6833 Body mass index (BMI) 33.0-33.9, adult: Secondary | ICD-10-CM | POA: Diagnosis not present

## 2014-09-28 DIAGNOSIS — E1140 Type 2 diabetes mellitus with diabetic neuropathy, unspecified: Secondary | ICD-10-CM

## 2014-09-28 DIAGNOSIS — R509 Fever, unspecified: Secondary | ICD-10-CM | POA: Diagnosis present

## 2014-09-28 DIAGNOSIS — E43 Unspecified severe protein-calorie malnutrition: Secondary | ICD-10-CM | POA: Diagnosis present

## 2014-09-28 DIAGNOSIS — W57XXXA Bitten or stung by nonvenomous insect and other nonvenomous arthropods, initial encounter: Secondary | ICD-10-CM | POA: Diagnosis present

## 2014-09-28 DIAGNOSIS — A419 Sepsis, unspecified organism: Secondary | ICD-10-CM | POA: Diagnosis present

## 2014-09-28 DIAGNOSIS — S42213A Unspecified displaced fracture of surgical neck of unspecified humerus, initial encounter for closed fracture: Secondary | ICD-10-CM | POA: Diagnosis present

## 2014-09-28 DIAGNOSIS — R6520 Severe sepsis without septic shock: Secondary | ICD-10-CM

## 2014-09-28 DIAGNOSIS — E119 Type 2 diabetes mellitus without complications: Secondary | ICD-10-CM | POA: Diagnosis present

## 2014-09-28 DIAGNOSIS — W19XXXD Unspecified fall, subsequent encounter: Secondary | ICD-10-CM | POA: Diagnosis present

## 2014-09-28 DIAGNOSIS — I10 Essential (primary) hypertension: Secondary | ICD-10-CM | POA: Diagnosis present

## 2014-09-28 DIAGNOSIS — Z794 Long term (current) use of insulin: Secondary | ICD-10-CM | POA: Diagnosis not present

## 2014-09-28 DIAGNOSIS — M79602 Pain in left arm: Secondary | ICD-10-CM | POA: Diagnosis present

## 2014-09-28 DIAGNOSIS — S42202K Unspecified fracture of upper end of left humerus, subsequent encounter for fracture with nonunion: Secondary | ICD-10-CM | POA: Diagnosis not present

## 2014-09-28 HISTORY — DX: Headache, unspecified: R51.9

## 2014-09-28 HISTORY — DX: Fever, unspecified: R50.9

## 2014-09-28 LAB — COMPREHENSIVE METABOLIC PANEL
ALBUMIN: 2.7 g/dL — AB (ref 3.5–5.0)
ALK PHOS: 101 U/L (ref 38–126)
ALT: 30 U/L (ref 14–54)
ALT: 36 U/L (ref 14–54)
ANION GAP: 9 (ref 5–15)
AST: 44 U/L — AB (ref 15–41)
AST: 70 U/L — ABNORMAL HIGH (ref 15–41)
Albumin: 3.2 g/dL — ABNORMAL LOW (ref 3.5–5.0)
Alkaline Phosphatase: 96 U/L (ref 38–126)
Anion gap: 10 (ref 5–15)
BUN: 15 mg/dL (ref 6–20)
BUN: 11 mg/dL (ref 6–20)
CALCIUM: 8.4 mg/dL — AB (ref 8.9–10.3)
CO2: 28 mmol/L (ref 22–32)
CO2: 24 mmol/L (ref 22–32)
Calcium: 9 mg/dL (ref 8.9–10.3)
Chloride: 95 mmol/L — ABNORMAL LOW (ref 101–111)
Chloride: 101 mmol/L (ref 101–111)
Creatinine, Ser: 0.75 mg/dL (ref 0.44–1.00)
Creatinine, Ser: 0.72 mg/dL (ref 0.44–1.00)
GFR calc Af Amer: >60 mL/min (ref >60)
GFR calc Af Amer: >60 mL/min (ref >60)
GFR calc non Af Amer: >60 mL/min (ref >60)
GFR calc non Af Amer: >60 mL/min (ref >60)
Glucose, Bld: 214 mg/dL — ABNORMAL HIGH (ref 65–99)
Glucose, Bld: 222 mg/dL — ABNORMAL HIGH (ref 65–99)
Potassium: 3.8 mmol/L (ref 3.5–5.1)
Potassium: 3.6 mmol/L (ref 3.5–5.1)
SODIUM: 135 mmol/L (ref 135–145)
Sodium: 132 mmol/L — ABNORMAL LOW (ref 135–145)
TOTAL PROTEIN: 5.7 g/dL — AB (ref 6.5–8.1)
Total Bilirubin: 0.7 mg/dL (ref 0.3–1.2)
Total Bilirubin: 1.1 mg/dL (ref 0.3–1.2)
Total Protein: 7.2 g/dL (ref 6.5–8.1)

## 2014-09-28 LAB — RAPID URINE DRUG SCREEN, HOSP PERFORMED
Amphetamines: NOT DETECTED
Barbiturates: NOT DETECTED
Benzodiazepines: NOT DETECTED
Cocaine: NOT DETECTED
Opiates: POSITIVE — AB
Tetrahydrocannabinol: NOT DETECTED

## 2014-09-28 LAB — URINALYSIS, ROUTINE W REFLEX MICROSCOPIC
BILIRUBIN URINE: NEGATIVE
GLUCOSE, UA: 100 mg/dL — AB
Hgb urine dipstick: NEGATIVE
Ketones, ur: NEGATIVE mg/dL
LEUKOCYTES UA: NEGATIVE
NITRITE: NEGATIVE
Protein, ur: 100 mg/dL — AB
Specific Gravity, Urine: 1.023 (ref 1.005–1.030)
Urobilinogen, UA: 1 mg/dL (ref 0.0–1.0)
pH: 8 (ref 5.0–8.0)

## 2014-09-28 LAB — GLUCOSE, CAPILLARY
GLUCOSE-CAPILLARY: 200 mg/dL — AB (ref 65–99)
GLUCOSE-CAPILLARY: 166 mg/dL — AB (ref 65–99)
GLUCOSE-CAPILLARY: 146 mg/dL — AB (ref 65–99)
GLUCOSE-CAPILLARY: 169 mg/dL — AB (ref 65–99)
Glucose-Capillary: 185 mg/dL — ABNORMAL HIGH (ref 65–99)

## 2014-09-28 LAB — CBC
HEMATOCRIT: 40.8 % (ref 36.0–46.0)
HEMOGLOBIN: 13.2 g/dL (ref 12.0–15.0)
MCH: 25.8 pg — ABNORMAL LOW (ref 26.0–34.0)
MCHC: 32.4 g/dL (ref 30.0–36.0)
MCV: 79.8 fL (ref 78.0–100.0)
PLATELETS: 168 10*3/uL (ref 150–400)
RBC: 5.11 MIL/uL (ref 3.87–5.11)
RDW: 13.8 % (ref 11.5–15.5)
WBC: 9.5 10*3/uL (ref 4.0–10.5)

## 2014-09-28 LAB — URINE MICROSCOPIC-ADD ON

## 2014-09-28 LAB — LACTIC ACID, PLASMA
Lactic Acid, Venous: 1.1 mmol/L (ref 0.5–2.0)
Lactic Acid, Venous: 1.5 mmol/L (ref 0.5–2.0)

## 2014-09-28 LAB — PROTIME-INR
INR: 1.14 (ref 0.00–1.49)
Prothrombin Time: 14.8 seconds (ref 11.6–15.2)

## 2014-09-28 LAB — C-REACTIVE PROTEIN: CRP: 21.7 mg/dL — AB (ref <1.0)

## 2014-09-28 LAB — APTT: APTT: 29 s (ref 24–37)

## 2014-09-28 LAB — SEDIMENTATION RATE: Sed Rate: 15 mm/hr (ref 0–22)

## 2014-09-28 LAB — HIV ANTIBODY (ROUTINE TESTING W REFLEX): HIV Screen 4th Generation wRfx: NONREACTIVE

## 2014-09-28 LAB — PROCALCITONIN: Procalcitonin: 7.01 ng/mL

## 2014-09-28 MED ORDER — HEPARIN SODIUM (PORCINE) 5000 UNIT/ML IJ SOLN
5000.0000 [IU] | Freq: Three times a day (TID) | INTRAMUSCULAR | Status: DC
  Administered 2014-09-28 – 2014-10-01 (×11): 5000 [IU] via SUBCUTANEOUS
  Filled 2014-09-28 (×13): qty 1

## 2014-09-28 MED ORDER — INSULIN GLARGINE 100 UNIT/ML ~~LOC~~ SOLN
2.0000 [IU] | Freq: Every day | SUBCUTANEOUS | Status: DC
  Administered 2014-09-28 (×2): 2 [IU] via SUBCUTANEOUS
  Filled 2014-09-28 (×3): qty 0.02

## 2014-09-28 MED ORDER — ACETAMINOPHEN 325 MG PO TABS
650.0000 mg | ORAL_TABLET | Freq: Four times a day (QID) | ORAL | Status: DC | PRN
  Administered 2014-09-28: 650 mg via ORAL
  Filled 2014-09-28: qty 2

## 2014-09-28 MED ORDER — HYDRALAZINE HCL 20 MG/ML IJ SOLN
5.0000 mg | INTRAMUSCULAR | Status: DC | PRN
  Administered 2014-09-28 – 2014-09-29 (×3): 5 mg via INTRAVENOUS
  Filled 2014-09-28 (×3): qty 1

## 2014-09-28 MED ORDER — PIPERACILLIN-TAZOBACTAM 3.375 G IVPB
3.3750 g | Freq: Once | INTRAVENOUS | Status: AC
  Administered 2014-09-28: 3.375 g via INTRAVENOUS
  Filled 2014-09-28: qty 50

## 2014-09-28 MED ORDER — HYDROCODONE-ACETAMINOPHEN 5-325 MG PO TABS
2.0000 | ORAL_TABLET | Freq: Once | ORAL | Status: DC
  Filled 2014-09-28: qty 2

## 2014-09-28 MED ORDER — ALUM & MAG HYDROXIDE-SIMETH 200-200-20 MG/5ML PO SUSP: 30.0000 mL | Freq: Four times a day (QID) | ORAL | Status: DC | PRN

## 2014-09-28 MED ORDER — SODIUM CHLORIDE 0.9 % IV SOLN
INTRAVENOUS | Status: DC
  Administered 2014-09-29 – 2014-09-30 (×2): via INTRAVENOUS

## 2014-09-28 MED ORDER — VANCOMYCIN HCL IN DEXTROSE 1-5 GM/200ML-% IV SOLN
1000.0000 mg | Freq: Once | INTRAVENOUS | Status: AC
  Administered 2014-09-28: 1000 mg via INTRAVENOUS
  Filled 2014-09-28: qty 200

## 2014-09-28 MED ORDER — ONDANSETRON HCL 4 MG/2ML IJ SOLN: 4.0000 mg | Freq: Three times a day (TID) | INTRAMUSCULAR | Status: DC | PRN

## 2014-09-28 MED ORDER — HYDROCODONE-ACETAMINOPHEN 5-325 MG PO TABS
1.0000 | ORAL_TABLET | Freq: Four times a day (QID) | ORAL | Status: DC | PRN
  Administered 2014-09-28 – 2014-09-30 (×4): 1 via ORAL
  Filled 2014-09-28 (×3): qty 1

## 2014-09-28 MED ORDER — MORPHINE SULFATE 2 MG/ML IJ SOLN
2.0000 mg | INTRAMUSCULAR | Status: DC | PRN
  Administered 2014-09-28 (×3): 2 mg via INTRAVENOUS
  Filled 2014-09-28 (×4): qty 1

## 2014-09-28 MED ORDER — PIPERACILLIN-TAZOBACTAM 3.375 G IVPB
3.3750 g | Freq: Three times a day (TID) | INTRAVENOUS | Status: DC
  Administered 2014-09-28 – 2014-10-01 (×10): 3.375 g via INTRAVENOUS
  Filled 2014-09-28 (×13): qty 50

## 2014-09-28 MED ORDER — SODIUM CHLORIDE 0.9 % IJ SOLN
3.0000 mL | Freq: Two times a day (BID) | INTRAMUSCULAR | Status: DC
  Administered 2014-09-28 – 2014-10-01 (×3): 3 mL via INTRAVENOUS

## 2014-09-28 MED ORDER — SODIUM CHLORIDE 0.9 % IV BOLUS (SEPSIS)
1000.0000 mL | Freq: Once | INTRAVENOUS | Status: AC
  Administered 2014-09-28: 1000 mL via INTRAVENOUS

## 2014-09-28 MED ORDER — DOXYCYCLINE HYCLATE 100 MG IV SOLR
100.0000 mg | Freq: Two times a day (BID) | INTRAVENOUS | Status: DC
  Administered 2014-09-28 – 2014-09-29 (×3): 100 mg via INTRAVENOUS
  Filled 2014-09-28 (×5): qty 100

## 2014-09-28 MED ORDER — ACETAMINOPHEN 500 MG PO TABS
1000.0000 mg | ORAL_TABLET | Freq: Once | ORAL | Status: AC
  Administered 2014-09-28: 1000 mg via ORAL
  Filled 2014-09-28: qty 2

## 2014-09-28 MED ORDER — SODIUM CHLORIDE 0.9 % IV SOLN
1250.0000 mg | Freq: Two times a day (BID) | INTRAVENOUS | Status: DC
  Administered 2014-09-28: 1250 mg via INTRAVENOUS
  Filled 2014-09-28 (×2): qty 1250

## 2014-09-28 MED ORDER — SODIUM CHLORIDE 0.9 % IV BOLUS (SEPSIS)
1500.0000 mL | Freq: Once | INTRAVENOUS | Status: AC
  Administered 2014-09-28: 1500 mL via INTRAVENOUS

## 2014-09-28 MED ORDER — MORPHINE SULFATE 2 MG/ML IJ SOLN
2.0000 mg | Freq: Once | INTRAMUSCULAR | Status: AC
  Administered 2014-09-28: 2 mg via INTRAVENOUS

## 2014-09-28 MED ORDER — VANCOMYCIN HCL IN DEXTROSE 750-5 MG/150ML-% IV SOLN: 750.0000 mg | Freq: Two times a day (BID) | INTRAVENOUS | Status: DC

## 2014-09-28 MED ORDER — SODIUM CHLORIDE 0.9 % IV BOLUS (SEPSIS): 500.0000 mL | Freq: Once | INTRAVENOUS | Status: DC

## 2014-09-28 MED ORDER — SODIUM CHLORIDE 0.9 % IV BOLUS (SEPSIS)
250.0000 mL | Freq: Once | INTRAVENOUS | Status: AC
Start: 2014-09-28 — End: 2014-09-28
  Administered 2014-09-28: 250 mL via INTRAVENOUS

## 2014-09-28 MED ORDER — INSULIN ASPART 100 UNIT/ML ~~LOC~~ SOLN
0.0000 [IU] | Freq: Three times a day (TID) | SUBCUTANEOUS | Status: DC
  Administered 2014-09-28: 2 [IU] via SUBCUTANEOUS
  Administered 2014-09-28: 1 [IU] via SUBCUTANEOUS
  Administered 2014-09-28: 2 [IU] via SUBCUTANEOUS
  Administered 2014-09-29 (×2): 1 [IU] via SUBCUTANEOUS
  Administered 2014-09-29 – 2014-09-30 (×2): 2 [IU] via SUBCUTANEOUS
  Administered 2014-09-30 – 2014-10-01 (×2): 1 [IU] via SUBCUTANEOUS

## 2014-09-28 NOTE — Progress Notes (Addendum)
Initial Nutrition Assessment  DOCUMENTATION CODES:   Severe malnutrition in context of acute illness/injury, Obesity unspecified   Pt meets criteria for SEVERE MALNUTRITION in the context of acute illness/injury as evidenced by a 12% weight loss in 2 months, and energy intake </= 50% for >/= 5 days.  INTERVENTION:   Diet advancement as medically appropriate.   NUTRITION DIAGNOSIS:   Inadequate oral intake related to poor appetite as evidenced by per patient/family report.  GOAL:   Patient will meet greater than or equal to 90% of their needs  MONITOR:   Diet advancement, Weight trends, Labs, I & O's  REASON FOR ASSESSMENT:   Malnutrition Screening Tool    ASSESSMENT:   74 y.o. female with PMH of diabetes and hypertension, not taking any medications currently, who presents with fever, chills, headache, left shoulder pain.  Pt is currently NPO. Pt is pain during time of visit. Family present at bedside. They report pt has been having a decreased appetite since her fracture. Pt has only been consuming 1 meal a day and a protein shake on occasion. Pt with weight loss with usual body weight of ~227 lbs. Per Epic weight records, pt with a 12% weight loss in 2 months. Pt is agreeable to nutritional supplements/nourishment snacks once diet advances. RD to order once pt is able to eat.   Pt with no observed significant fat or muscle mass loss.   Labs and medications reviewed.   Diet Order:  Diet NPO time specified  Skin:  Reviewed, no issues  Last BM:  7/17  Height:   Ht Readings from Last 1 Encounters:  09/28/14 5\' 5"  (1.651 m)    Weight:   Wt Readings from Last 1 Encounters:  09/28/14 199 lb 14.4 oz (90.674 kg)    Ideal Body Weight:  56.8 kg  Wt Readings from Last 10 Encounters:  09/28/14 199 lb 14.4 oz (90.674 kg)  08/04/14 227 lb (102.967 kg)    BMI:  Body mass index is 33.27 kg/(m^2).  Estimated Nutritional Needs:   Kcal:  1850-2050  Protein:  100-110  grams  Fluid:  1.9 - 2 L/day  EDUCATION NEEDS:   No education needs identified at this time  Roslyn SmilingStephanie Chidi Shirer, MS, RD, LDN Pager # [phone] After hours/ weekend pager # [phone]

## 2014-09-28 NOTE — Progress Notes (Signed)
Patient seen and examined. Admitted earlier today with chills. Found to have a lactic acid of 2.5, a pro-calcitonin of 7 and a temperature of 102 on admission. Was placed on broad-spectrum antibiotics consisting of vancomycin, Zosyn and doxycycline. No source of infection is readily apparent, chest x-ray and urinalysis are clear, blood cultures so far negative. She has been afebrile since admission. Discussed with infectious diseases and will start peeling off antibiotics starting with the vancomycin. If afebrile tomorrow we'll discontinue the Zosyn. She has a left humeral fracture and [facility] She has also been seen by Dr. Eulah PontMurphy here in the hospital. Will need outpatient follow-up for this.  Peggye PittEstela Hernandez, MD [facility] Pager: [phone]

## 2014-09-28 NOTE — Progress Notes (Signed)
Patient arrived to the unit via stretcher with nurse tech. Patient speaks little english, daughter and son at bedside to translate. Patient alert and oriented x4. Patient has been orientated to the room, unit and the staff. Patient denies pain. Patient is placed on telemetry, CCMD notified. Skin assessment completed with Thermon LeylandKim Gengler, RN. An healing scar on left knee and discoloration of left lower leg were noted. IV clean, dry and infusing. Safety Fall Prevention Plan was given, discussed and signed by patient. Orders have been reviewed and implemented. Call light has been placed within reach and bed alarm has been activated. RN will continue to monitor the patient.   Rivka BarbaraZenab Olajuwon Fosdick BSN, RN  Phone Number: [phone]

## 2014-09-28 NOTE — Evaluation (Addendum)
Physical Therapy Evaluation and Discharge Patient Details Name: Meghan Ford MRN: [id number] DOB: 1941-01-14 Today's Date: 09/28/2014   History of Present Illness  HPI: Meghan Ford is a 74 y.o. female with PMH of diabetes and hypertension, not taking any medications currently, who presents with fever, chills, headache, left shoulder pain. Fractured L prox humerus in May and they have been attempting conservative management; Ortho consulted, and arthroplasty is under consideration, but Dr. Eulah Pont does not want to consider a semi-elective surgery as long as there is a potential infective process going on  Clinical Impression  Patient evaluated by Physical Therapy with no further acute PT needs identified. All education has been completed and the patient has no further questions.  See below for any follow-up Physical Therapy or equipment needs. PT is signing off. Thank you for this referral.     Follow Up Recommendations Other (comment) (Likley will need PT postop if they decide for TSA)    Equipment Recommendations  None recommended by PT    Recommendations for Other Services       Precautions / Restrictions Precautions Precaution Comments: L Proximal Humerus fracture Required Braces or Orthoses: Sling Restrictions Weight Bearing Restrictions: Yes LUE Weight Bearing: Non weight bearing      Mobility  Bed Mobility Overal bed mobility: Needs Assistance Bed Mobility: Supine to Sit     Supine to sit: Min assist     General bed mobility comments: min handheld assist to pull to sit  Transfers Overall transfer level: Modified independent                  Ambulation/Gait Ambulation/Gait assistance: Modified independent (Device/Increase time) Ambulation Distance (Feet): 300 Feet (greater than) Assistive device: None (and pushing IV) Gait Pattern/deviations: WFL(Within Functional Limits)        Stairs            Wheelchair Mobility     Modified Rankin (Stroke Patients Only)       Balance Overall balance assessment: No apparent balance deficits (not formally assessed)                                           Pertinent Vitals/Pain Pain Assessment: 0-10 Pain Score: 8  Pain Location: L shoulder Pain Descriptors / Indicators: Aching;Grimacing Pain Intervention(s): Limited activity within patient's tolerance;Monitored during session;Premedicated before session;Repositioned    Home Living Family/patient expects to be discharged to:: Private residence Living Arrangements: Alone Available Help at Discharge: Family;Available PRN/intermittently Type of Home: House Home Access: Level entry     Home Layout: One level Home Equipment: None      Prior Function Level of Independence: Independent               Hand Dominance   Dominant Hand: Right    Extremity/Trunk Assessment   Upper Extremity Assessment:  (LUE immobilized in sling)           Lower Extremity Assessment: Overall WFL for tasks assessed         Communication   Communication: No difficulties;Prefers language other than English;Other (comment) (Spanish)  Cognition Arousal/Alertness: Awake/alert Behavior During Therapy: WFL for tasks assessed/performed Overall Cognitive Status: Within Functional Limits for tasks assessed                      General Comments General comments (skin integrity, edema, etc.): Pt is knowledgeable in donning  and doffing brace; daughter and niece help her with bathing and dressing at home    Exercises        Assessment/Plan    PT Assessment Patent does not need any further PT services  PT Diagnosis Acute pain   PT Problem List    PT Treatment Interventions     PT Goals (Current goals can be found in the Care Plan section) Acute Rehab PT Goals Patient Stated Goal: likes to walk PT Goal Formulation: All assessment and education complete, DC therapy    Frequency      Barriers to discharge        Co-evaluation               End of Session Equipment Utilized During Treatment:  (sling) Activity Tolerance: Patient tolerated treatment well Patient left: Other (comment) (Walking hallway with family) Nurse Communication: Mobility status         Time: [id number] [id number] PT Time Calculation (min) (ACUTE ONLY): 26 min   Charges:   PT Evaluation $Initial PT Evaluation Tier I: 1 Procedure PT Treatments $Gait Training: 8-22 mins   PT G Codes:        Olen PelGarrigan, Aliene Tamura Hamff 09/28/2014, 4:32 PM  Van ClinesHolly Brax [place], [place]PT  Acute Rehabilitation Services Pager [phone] Office [phone]

## 2014-09-28 NOTE — H&P (Signed)
[facility] History and Physical  Meghan Ford WOE:[id number] DOB: [birth year] DOA: 09/27/2014  Referring physician: ED physician PCP: No PCP Per Patient  Specialists:   Chief Complaint: Fever, chills, headache, left shoulder pain  HPI: Meghan Ford is a 75 y.o. female with PMH of diabetes and hypertension, not taking any medications currently, who presents with fever, chills, headache, left shoulder pain.  Pt is spanish-speaking only. Three children are at the bedside translating. Patient reports that she started having fever, chills, headache today. She does not have runny nose, sore throat, cough, chest pain, abdominal pain, diarrhea, nausea, vomiting, symptoms of  UTI. Her headache is located in the top of head and occipital area. She has left side of neck pain upon movement which is related with her shoulder pain secondary to left humeral neck fracture. She does not have posterior neck pain, neck stiffness or photophobia. Patient reports that she had insect bite on Saturday. She has a small red insect bite mark over posterior knee area. No recent traveling to other country. She traveled to [place] on 09/08/14.  Patient reports that she had left humeral neck fracture in May 2016. She was seen at [facility] and decided to postpone surgery due to a fear of anaesthesia. Currently she is in a sling. Her left shoulder pain has worsened today and she rates it 10/10 in severity. She took oxycodone with only partial relief. She also complains of left lower leg pain that she has had since her fall in May. She has two small skin tears and now with scabs.   In ED, patient was found to have elevated lactate at 2.51, negative troponin, WBC 9.7, temperature 102.0, negative urinalysis, negative chest x-ray. X-ray of her left shoulder showed Displaced fracture of the left humeral neck similar to prior study,no new fracture.  Where does patient live?   At home   Can patient  participate in ADLs? Some   Review of Systems:   General: has fevers, chills, no changes in body weight, has poor appetite, has fatigue HEENT: no blurry vision, hearing changes or sore throat Pulm: no dyspnea, coughing, wheezing CV: no chest pain, palpitations Abd: no nausea, vomiting, abdominal pain, diarrhea, constipation GU: no dysuria, burning on urination, increased urinary frequency, hematuria  Ext: no leg edema. Neuro: no unilateral weakness, numbness, or tingling, no vision change or hearing loss Skin: has a small red inset bite mark over posterior knee. MSK: has tenderness over left shoulder. Heme: No easy bruising.  Travel history: No recent long distant travel.  Allergy: No Known Allergies  Past Medical History  Diagnosis Date  . Diabetes mellitus without complication   . Fx humeral neck     Past Surgical History  Procedure Laterality Date  . Vein surgery      left leg    Social History:  reports that she has never smoked. She does not have any smokeless tobacco history on file. She reports that she does not drink alcohol or use illicit drugs.  Family History:  Family History  Problem Relation Age of Onset  . Heart disease Sister      Prior to Admission medications   Medication Sig Start Date End Date Taking? Authorizing Provider  HYDROcodone-acetaminophen (NORCO/VICODIN) 5-325 MG per tablet Take 1 tablet by mouth every 4 (four) hours as needed. Patient not taking: Reported on 09/28/2014 08/04/14   Waynetta Pean, PA-C    Physical Exam: Danley Danker Vitals:   09/28/14 0037 09/28/14 0310 09/28/14 0488 09/28/14 0507  BP: 135/52 135/56 146/58 175/70  Pulse: 81 65 71 74  Temp: 98.8 F (37.1 C) 99.4 F (37.4 C) 98.2 F (36.8 C) 97.9 F (36.6 C)  TempSrc: Axillary Oral Oral Oral  Resp: _0 Height: _1  (1.651 m)     Weight: 90.674 kg (199 lb 14.4 oz)     SpO2: 95% 99% 94% 99%   General: Not in acute distress HEENT:       Eyes: PERRL, EOMI, no  scleral icterus.       ENT: No discharge from the ears and nose, no pharynx injection, no tonsillar enlargement.        Neck: No JVD, no bruit, no mass felt. Heme: No neck lymph node enlargement. Cardiac: S1/S2, RRR, No murmurs, No gallops or rubs. Pulm: Good air movement bilaterally. No rales, wheezing, rhonchi or rubs. Abd: Soft, nondistended, nontender, no rebound pain, no organomegaly, BS present. Ext: No pitting leg edema bilaterally. 2+DP/PT pulse bilaterally. Skin: has a small red inset bite mark over posterior knee. There are several small areas of skin tears with scabs in left leg, no signs of infection MSK: has tenderness over left shoulder, no redness or warmth. Neuro: Alert, oriented X3, cranial nerves II-XII grossly intact, muscle strength 5/5 in all extremities, sensation to light touch intact. Brachial reflex 1+ bilaterally. Knee reflex 1+ bilaterally. Negative Babinski's sign. Normal finger to nose test. Negative Kernig sign, negative Brudzinski sign, no nuchal rigidity. Neck is very soft. Has pain over left side of the neck upon bending neck, which seems to be related to the left shoulder pain.  Psych: Patient is not psychotic, no suicidal or hemocidal ideation.  Labs on Admission:  Basic Metabolic Panel:  Recent Labs Lab 09/27/14 2321 09/28/14 0312  NA 132* 135  K 3.8 3.6  CL 95* 101  CO2 28 24  GLUCOSE 214* 222*  BUN 15 11  CREATININE 0.75 0.72  CALCIUM 9.0 8.4*   Liver Function Tests:  Recent Labs Lab 09/27/14 2321 09/28/14 0312  AST 44* 70*  ALT 30 36  ALKPHOS 101 96  BILITOT 0.7 1.1  PROT 7.2 5.7*  ALBUMIN 3.2* 2.7*   No results for input(s): LIPASE, AMYLASE in the last 168 hours. No results for input(s): AMMONIA in the last 168 hours. CBC:  Recent Labs Lab 09/27/14 2321 09/28/14 0312  WBC 9.7 9.5  NEUTROABS 8.2*  --   HGB 14.1 13.2  HCT 43.8 40.8  MCV 80.7 79.8  PLT 195 168   Cardiac Enzymes: No results for input(s): CKTOTAL, CKMB,  CKMBINDEX, TROPONINI in the last 168 hours.  BNP (last 3 results) No results for input(s): BNP in the last 8760 hours.  ProBNP (last 3 results) No results for input(s): PROBNP in the last 8760 hours.  CBG:  Recent Labs Lab 09/28/14 0309  GLUCAP 185*    Radiological Exams on Admission: Dg Chest 2 View  09/28/2014   CLINICAL DATA:  74 year old female with fever and chills.  EXAM: CHEST  2 VIEW  COMPARISON:  08/04/2014 sign  FINDINGS: The heart size and mediastinal contours are within normal limits. Both lungs are clear. Osteopenia with degenerative changes of the spine.  IMPRESSION: No active cardiopulmonary disease.   Electronically Signed   By: Anner Crete M.D.   On: 09/28/2014 00:26   Dg Humerus Left  09/28/2014   CLINICAL DATA:  74 year old female with left arm pain  EXAM: LEFT HUMERUS - 2+ VIEW  COMPARISON:  Radiograph dated  08/04/2014  FINDINGS: There is a hint displaced fracture of the left humeral neck similar to the prior study. Ninety fracture identified. The soft tissues are grossly unremarkable.  IMPRESSION: Displaced fracture of the left humeral neck similar to prior study. No new fracture.   Electronically Signed   By: Anner Crete M.D.   On: 09/28/2014 00:29    EKG: Independently reviewed.  Abnormal findings: qWave in  Inferiorly leads  Assessment/Plan Principal Problem:   Sepsis Active Problems:   Fever   Fx humeral neck   Diabetes mellitus without complication   Headache   Pyrexia  Sepsis: Patient is septic on admission with fever and elevated lactate. The source of infection is not clear. Urinalysis and chest x-ray are all negative. No meningeal signs, less likely to have meningitis. Currently hemodynamically stable. Patient had insect bite mark over left posterior knee area, need to r/u insect bite related infection, such as Lyme disease and RMSF. -will admit to tele bed -ED started IV vancomycin and zosyn, will continue -will add IV doxycyline -check  RMSF IgM and IgG and B burgdorferi ab -will get Procalcitonin and trend lactic acid levels per sepsis protocol. -IVF: 2.5L of NS bolus in ED, followed by 100 cc/h -check ESR and CRP, UDS, HIV ab -Neuro check q4h -follow up blood culture  HA: Likely due to fever. No meningeal signs. Less likely to have meningitis. -Pain control  HTN: not taking med at home. Blood pressure is 140/61 -IV hydralazine when necessary  Diabetes mellitus without complication: no M1D on record. Used to take insulin, now is not taking any meds at home. -SSI and lantus 2 units daily -A1c  Fx humeral neck: ED physician, Dr. Leonides Schanz called ortho, Dr. Percell Miller called back to state patient has seen Dr. Lorin Mercy with [facility] orthopedics and they would need to be contacted in the morning. -Pain control: When necessary Percocet and morphine -need to call Dr. Lorin Mercy with [facility] orthopedics   DVT ppx: SQ Heparin      Code Status: Full code Family Communication: Yes, patient's one son and two daughters    at bed side Disposition Plan: Admit to inpatient   Date of Service 09/28/2014    Ivor Costa [facility] Pager [phone]  If 7PM-7AM, please contact night-coverage [email or web address] Password [facility] 09/28/2014, 5:44 AM

## 2014-09-28 NOTE — Progress Notes (Signed)
ANTIBIOTIC CONSULT NOTE - INITIAL  Pharmacy Consult for Vancocin and Zosyn Indication: rule out sepsis  No Known Allergies  Patient Measurements: Height: 5\' 5"  (165.1 cm) Weight: 199 lb 14.4 oz (90.674 kg) IBW/kg (Calculated) : 57  Vital Signs: Temp: 98.8 F (37.1 C) (07/18 0203) Temp Source: Axillary (07/18 0203) BP: 135/52 mmHg (07/18 0203) Pulse Rate: 81 (07/18 0203)  Labs:  Recent Labs  09/27/14 2321  WBC 9.7  HGB 14.1  PLT 195  CREATININE 0.75   Estimated Creatinine Clearance: 70.7 mL/min (by C-G formula based on Cr of 0.75).   Microbiology: Recent Results (from the past 720 hour(s))  Blood culture (routine x 2)     Status: None (Preliminary result)   Collection Time: 09/28/14 12:36 AM  Result Value Ref Range Status   Specimen Description BLOOD LEFT HAND  Final   Special Requests BOTTLES DRAWN AEROBIC AND ANAEROBIC 5CC EA  Final   Culture PENDING  Incomplete   Report Status PENDING  Incomplete    Medical History: Past Medical History  Diagnosis Date  . Diabetes mellitus without complication   . Fx humeral neck     Medications:  Prescriptions prior to admission  Medication Sig Dispense Refill Last Dose  . HYDROcodone-acetaminophen (NORCO/VICODIN) 5-325 MG per tablet Take 1 tablet by mouth every 4 (four) hours as needed. (Patient not taking: Reported on 09/28/2014) 24 tablet 0 Not Taking at Unknown time   Scheduled:  . doxycycline (VIBRAMYCIN) IV  100 mg Intravenous Q12H  . heparin  5,000 Units Subcutaneous 3 times per day  . insulin aspart  0-9 Units Subcutaneous TID WC  . insulin glargine  2 Units Subcutaneous QHS  . piperacillin-tazobactam (ZOSYN)  IV  3.375 g Intravenous 3 times per day  . sodium chloride  1,500 mL Intravenous Once  . sodium chloride  3 mL Intravenous Q12H  . vancomycin  750 mg Intravenous Q12H   Infusions:  . sodium chloride      Assessment: 74yo female w/ recent LUE fracture c/o increased arm pain and chills, pt has  postponed ortho surgery, now concern for sepsis, to begin IV ABX.  Goal of Therapy:  Vancomycin trough level 15-20 mcg/ml  Plan:  Rec'd vancomycin 1g and Zosyn 3.375g IV in ED; will continue with vancomycin 1250mg  IV Q12H and Zosyn 3.375g IV Q8H and monitor CBC, Cx, levels prn.  Vernard GamblesVeronda Mikeala Girdler, PharmD, BCPS  09/28/2014,2:36 AM

## 2014-09-28 NOTE — Consult Note (Signed)
ORTHOPAEDIC CONSULTATION  REQUESTING PHYSICIAN: Meghan ArgueEstela Y Hernandez Ford*  Chief Complaint: left proximal humerus fracture  HPI: Meghan Ford is a 74 y.o. female who has been admitted for sepsis workup. Overall she reports feeling better this morning. She does not feel ill. She has had a fractured her left proximal humerus since May her son believes she has [facility] who tried a cuff and collar treatment however she is failed this she is continued to be largely displaced and they were discussing doing what sounds like an arthroplasty for fracture however she had increased pain and fevers so she presented to the emergency room.  Past Medical History  Diagnosis Date  . Diabetes mellitus without complication   . Fx humeral neck    Past Surgical History  Procedure Laterality Date  . Vein surgery      left leg   History   Social History  . Marital Status: Single    Spouse Name: N/A  . Number of Children: N/A  . Years of Education: N/A   Social History Main Topics  . Smoking status: Never Smoker   . Smokeless tobacco: Not on file  . Alcohol Use: No  . Drug Use: No  . Sexual Activity: Not on file   Other Topics Concern  . None   Social History Narrative   Family History  Problem Relation Age of Onset  . Heart disease Sister    No Known Allergies Prior to Admission medications   Medication Sig Start Date End Date Taking? Authorizing Provider  HYDROcodone-acetaminophen (NORCO/VICODIN) 5-325 MG per tablet Take 1 tablet by mouth every 4 (four) hours as needed. Patient not taking: Reported on 09/28/2014 08/04/14   Everlene FarrierWilliam Dansie, PA-C   Dg Chest 2 View  09/28/2014   CLINICAL DATA:  74 year old female with fever and chills.  EXAM: CHEST  2 VIEW  COMPARISON:  08/04/2014 sign  FINDINGS: The heart size and mediastinal contours are within normal limits. Both lungs are clear. Osteopenia with degenerative changes of the spine.  IMPRESSION: No  active cardiopulmonary disease.   Electronically Signed   By: Elgie CollardArash  Radparvar M.D.   On: 09/28/2014 00:26   Dg Humerus Left  09/28/2014   CLINICAL DATA:  74 year old female with left arm pain  EXAM: LEFT HUMERUS - 2+ VIEW  COMPARISON:  Radiograph dated 08/04/2014  FINDINGS: There is a hint displaced fracture of the left humeral neck similar to the prior study. Ninety fracture identified. The soft tissues are grossly unremarkable.  IMPRESSION: Displaced fracture of the left humeral neck similar to prior study. No new fracture.   Electronically Signed   By: Elgie CollardArash  Radparvar M.D.   On: 09/28/2014 00:29    Positive ROS: All other systems have been reviewed and were otherwise negative with the exception of those mentioned in the HPI and as above.  Labs cbc  Recent Labs  09/27/14 2321 09/28/14 0312  WBC 9.7 9.5  HGB 14.1 13.2  HCT 43.8 40.8  PLT 195 168    Labs inflam  Recent Labs  09/28/14 0312  CRP 21.7*    Labs coag  Recent Labs  09/28/14 0312  INR 1.14     Recent Labs  09/27/14 2321 09/28/14 0312  NA 132* 135  K 3.8 3.6  CL 95* 101  CO2 28 24  GLUCOSE 214* 222*  BUN 15 11  CREATININE 0.75 0.72  CALCIUM 9.0 8.4*    Physical Exam: Filed Vitals:  09/28/14 0751  BP: 169/72  Pulse: 76  Temp: 98.4 F (36.9 C)  Resp: 20   General: Alert, no acute distress Cardiovascular: No pedal edema Respiratory: No cyanosis, no use of accessory musculature GI: No organomegaly, abdomen is soft and non-tender Skin: No lesions in the area of chief complaint other than those listed below in MSK exam.  Neurologic: Sensation intact distally Psychiatric: Patient is competent for consent with normal mood and affect Lymphatic: No axillary or cervical lymphadenopathy  MUSCULOSKELETAL:  LUE: NVI, pain at her proximal humerus with ROM. Her compartments are soft. Distally she has normal sensation positive EHL FHL and interosseous muscle function and 2+ pulses. BLE: Multiple small  skin abrasions no surrounding erythema and no drainage no signs of ulceration at her feet. She has a small dressing over a scabbed over wound at her knee but again no surrounding erythema no drainage compartments are soft Other extremities are atraumatic with painless ROM and NVI.  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 Roney Mans at [facility] who attempted closed management but has now offered her surgical treatment for this.  The patient is welcome to follow up with Dr. Roney Mans or myself if she wants to follow up with me we should obtain a CT scan of her left shoulder.  I do not see any signs of source of infection at her legs her shoulder was never open and would be unlikely for source of infection. I would strongly recommends recommend against any semi-elective surgical procedure such as arthroplasty for fracture in the setting of possible sepsis. She should follow-up as an outpatient for surgical treatment for her shoulder  I will sign off at this time please obtain CAT scan if they would like to follow up with me and have her see me in the next few weeks. It appears as though she never did establish with Dr. Ophelia Charter so consult with [place] is not necessary at this time.   Sheral Apley, MD Cell [phone]   09/28/2014 8:05 AM

## 2014-09-29 ENCOUNTER — Inpatient Hospital Stay (HOSPITAL_COMMUNITY): Payer: Medicaid Other

## 2014-09-29 ENCOUNTER — Encounter (HOSPITAL_COMMUNITY): Payer: Self-pay

## 2014-09-29 DIAGNOSIS — E43 Unspecified severe protein-calorie malnutrition: Secondary | ICD-10-CM | POA: Insufficient documentation

## 2014-09-29 DIAGNOSIS — R7881 Bacteremia: Secondary | ICD-10-CM

## 2014-09-29 DIAGNOSIS — K5900 Constipation, unspecified: Secondary | ICD-10-CM

## 2014-09-29 DIAGNOSIS — R51 Headache: Secondary | ICD-10-CM

## 2014-09-29 DIAGNOSIS — M25512 Pain in left shoulder: Secondary | ICD-10-CM

## 2014-09-29 LAB — URINE CULTURE

## 2014-09-29 LAB — BASIC METABOLIC PANEL
Anion gap: 10 (ref 5–15)
BUN: 8 mg/dL (ref 6–20)
CALCIUM: 8.3 mg/dL — AB (ref 8.9–10.3)
CHLORIDE: 100 mmol/L — AB (ref 101–111)
CO2: 24 mmol/L (ref 22–32)
Creatinine, Ser: 0.7 mg/dL (ref 0.44–1.00)
GFR calc Af Amer: >60 mL/min (ref >60)
Glucose, Bld: 136 mg/dL — ABNORMAL HIGH (ref 65–99)
Potassium: 3.4 mmol/L — ABNORMAL LOW (ref 3.5–5.1)
Sodium: 134 mmol/L — ABNORMAL LOW (ref 135–145)

## 2014-09-29 LAB — CBC
HCT: 41.1 % (ref 36.0–46.0)
Hemoglobin: 13.2 g/dL (ref 12.0–15.0)
MCH: 25.5 pg — AB (ref 26.0–34.0)
MCHC: 32.1 g/dL (ref 30.0–36.0)
MCV: 79.3 fL (ref 78.0–100.0)
Platelets: 192 10*3/uL (ref 150–400)
RBC: 5.18 MIL/uL — ABNORMAL HIGH (ref 3.87–5.11)
RDW: 13.8 % (ref 11.5–15.5)
WBC: 10.5 10*3/uL (ref 4.0–10.5)

## 2014-09-29 LAB — B. BURGDORFI ANTIBODIES: B burgdorferi Ab IgG+IgM: <0.91 {ISR} (ref 0.00–0.90)

## 2014-09-29 LAB — GLUCOSE, CAPILLARY
GLUCOSE-CAPILLARY: 178 mg/dL — AB (ref 65–99)
GLUCOSE-CAPILLARY: 136 mg/dL — AB (ref 65–99)
Glucose-Capillary: 147 mg/dL — ABNORMAL HIGH (ref 65–99)
Glucose-Capillary: 147 mg/dL — ABNORMAL HIGH (ref 65–99)

## 2014-09-29 LAB — ROCKY MTN SPOTTED FVR ABS PNL(IGG+IGM)
RMSF IgG: NEGATIVE
RMSF IgM: 0.61 index (ref 0.00–0.89)

## 2014-09-29 LAB — HEMOGLOBIN A1C
Hgb A1c MFr Bld: 10.4 % — ABNORMAL HIGH (ref 4.8–5.6)
Mean Plasma Glucose: 252 mg/dL

## 2014-09-29 MED ORDER — INSULIN GLARGINE 100 UNIT/ML ~~LOC~~ SOLN
8.0000 [IU] | Freq: Every day | SUBCUTANEOUS | Status: DC
  Administered 2014-09-29 – 2014-09-30 (×2): 8 [IU] via SUBCUTANEOUS
  Filled 2014-09-29 (×3): qty 0.08

## 2014-09-29 MED ORDER — IOHEXOL 300 MG/ML  SOLN
80.0000 mL | Freq: Once | INTRAMUSCULAR | Status: AC | PRN
  Administered 2014-09-29: 100 mL via INTRAVENOUS

## 2014-09-29 MED ORDER — LISINOPRIL 5 MG PO TABS
5.0000 mg | ORAL_TABLET | Freq: Every day | ORAL | Status: DC
  Administered 2014-09-29: 5 mg via ORAL
  Filled 2014-09-29 (×2): qty 1

## 2014-09-29 NOTE — Evaluation (Addendum)
Occupational Therapy Evaluation Patient Details Name: Meghan Ford MRN: [id number] DOB: [birth year] Today's Date: 09/29/2014    History of Present Illness HPI: Meghan Ford is a 74 y.o. female with PMH of diabetes and hypertension, not taking any medications currently, who presents with fever, chills, headache, left shoulder pain. Fractured L prox humerus in May and they have been attempting conservative management; Ortho consulted, and arthroplasty is under consideration, but Dr. Eulah PontMurphy does not want to consider a semi-elective surgery as long as there is a potential infective process going on.   Clinical Impression   Pt admitted with above. Pt getting assist with bathing, PTA. Pt has been managing with LUE for a while now. Education provided in session. Feel pt is safe to d/c home, from OT standpoint, with family available to assist.    Follow Up Recommendations  No OT follow up;Supervision - Intermittent    Equipment Recommendations  None recommended by OT    Recommendations for Other Services       Precautions / Restrictions Precautions Precaution Comments: no pushing, pulling, lifting with LUE (can hold light items), no movement of left shoulder, per son, MD said it was okay to move elbow Required Braces or Orthoses: Sling Restrictions Weight Bearing Restrictions: Yes LUE Weight Bearing: Non weight bearing      Mobility Bed Mobility Overal bed mobility: Needs Assistance Bed Mobility: Supine to Sit     Supine to sit: Mod assist     General bed mobility comments: assist with trunk. Discussed with son having pt get in/out of bed on right side or she could sleep in chair.   Transfers Overall transfer level: Needs assistance   Transfers: Sit to/from Stand Sit to Stand: Modified independent (Device/Increase time);Supervision         General transfer comment: supervision for stand to sit (IV)    Balance Overall balance assessment:  (no LOB  in session)                                          ADL Overall ADL's : Needs assistance/impaired                     Lower Body Dressing: Minimal assistance;Sit to/from stand   Toilet Transfer: Ambulation;Modified Independent;Supervision/safety (supervision for IV for stand to sit transfer)           Functional mobility during ADLs: Modified independent/Supervision General ADL Comments: Pt reports she understands how to manage sling. Did not use sling in session as pt did not want to, but she did well keeping it close to her body. Discussed positioning of pillows for LUE, UB clothing, reviewed UB dressing technique-pt able to verbalize. Recommended someone be with pt for bathing (son reports daughter bathes pt outside). Educated on exercises for left wrist/hand. Educated on safety. Educated on UB bathing technique for LUE. Educated on LUE precautions.     Vision     Perception     Praxis      Pertinent Vitals/Pain Pain Assessment: 0-10 Pain Score: 5  Pain Location: LUE and head Pain Descriptors / Indicators: Headache Pain Intervention(s): Monitored during session     Hand Dominance Right   Extremity/Trunk Assessment Upper Extremity Assessment Upper Extremity Assessment: LUE deficits/detail LUE Deficits / Details: proximal humerus fracture; able to move wrist/forearm and digits   Lower Extremity Assessment Lower Extremity Assessment: Defer to PT evaluation  Communication Communication Communication: Prefers language other than English (Spanish); son interpreted and pt/son filled out consent form   Cognition Arousal/Alertness: Awake/alert Behavior During Therapy: WFL for tasks assessed/performed Overall Cognitive Status: Within Functional Limits for tasks assessed                     General Comments       Exercises   Other Exercises Other Exercises: moving left wrist   Shoulder Instructions      Home Living  Family/patient expects to be discharged to:: Private residence Living Arrangements: Alone Available Help at Discharge: Family;Available PRN/intermittently Type of Home: House Home Access: Level entry     Home Layout: One level     Bathroom Shower/Tub: Chief Strategy Officer: Handicapped height     Home Equipment: None          Prior Functioning/Environment Level of Independence: Needs assistance    ADL's / Homemaking Assistance Needed: assist with bathing        OT Diagnosis: Acute pain   OT Problem List:     OT Treatment/Interventions:      OT Goals(Current goals can be found in the care plan section)    OT Frequency:     Barriers to D/C:            Co-evaluation              End of Session Nurse Communication: Other (comment) (interpreter consent form)  Activity Tolerance: Patient tolerated treatment well Patient left: in bed;with family/visitor present   Time: 1433-1450 OT Time Calculation (min): 17 min Charges:  OT General Charges $OT Visit: 1 Procedure OT Evaluation $Initial OT Evaluation Tier I: 1 Procedure G-CodesEarlie Raveling OTR/L [id number] 09/29/2014, 3:42 PM

## 2014-09-29 NOTE — Progress Notes (Signed)
[facility] PROGRESS NOTE  Meghan FormMarcelina Ford ZOX:[id number] DOB: 01-02-1941 DOA: 09/27/2014 PCP: No PCP Per Patient  Assessment/Plan: Sepsis/fever -Source remains unclear. -Was febrile overnight. -CXR/UA negative. -Has some bruising over left shoulder after fall. -Discussed with ID. -Will obtain CT of left shoulder. -1/2 Blood cx with GNR: continue zosyn DC doxy. -BP normalized.  HTN -BP creeping up. -Not on meds at home. -Will start lisinopril given co-morbid indication of DM.  DM 2 -CBGs elevated. -Increase lantus to 8 units.  Left Humeral Neck Fracture -Seen by ortho. -For CT scan of left shoulder.  Code Status: Full Code Family Communication: patient only  Disposition Plan: to be determined   Consultants:  Ortho  ID   Antibiotics:  Zosyn   Subjective: Still has chills, no other complaints/issues.  Objective: Filed Vitals:   09/28/14 2337 09/29/14 0548 09/29/14 0630 09/29/14 0757  BP: 173/65 183/78 174/70 172/65  Pulse: 92 81  78  Temp: 98.6 F (37 C) 99.5 F (37.5 C)  98.1 F (36.7 C)  TempSrc: Oral Oral  Oral  Resp:  20  20  Height:      Weight:      SpO2:  95%  95%    Intake/Output Summary (Last 24 hours) at 09/29/14 1056 Last data filed at 09/29/14 0758  Gross per 24 hour  Intake    900 ml  Output   1950 ml  Net  -1050 ml   Filed Weights   09/28/14 0203  Weight: 90.674 kg (199 lb 14.4 oz)    Exam:   General:  AA Ox3  Cardiovascular: RRR  Respiratory: CTA B  Abdomen: obese/S/NT/ND/+BS  Extremities: trace bilateral edema   Neurologic:  Non-focal  Data Reviewed: Basic Metabolic Panel:  Recent Labs Lab 09/27/14 2321 09/28/14 0312 09/29/14 0535  NA 132* 135 134*  K 3.8 3.6 3.4*  CL 95* 101 100*  CO2 28 24 24   GLUCOSE 214* 222* 136*  BUN 15 11 8   CREATININE 0.75 0.72 0.70  CALCIUM 9.0 8.4* 8.3*   Liver Function Tests:  Recent Labs Lab 09/27/14 2321 09/28/14 0312  AST 44* 70*  ALT 30  36  ALKPHOS 101 96  BILITOT 0.7 1.1  PROT 7.2 5.7*  ALBUMIN 3.2* 2.7*   No results for input(s): LIPASE, AMYLASE in the last 168 hours. No results for input(s): AMMONIA in the last 168 hours. CBC:  Recent Labs Lab 09/27/14 2321 09/28/14 0312 09/29/14 0535  WBC 9.7 9.5 10.5  NEUTROABS 8.2*  --   --   HGB 14.1 13.2 13.2  HCT 43.8 40.8 41.1  MCV 80.7 79.8 79.3  PLT 195 168 192   Cardiac Enzymes: No results for input(s): CKTOTAL, CKMB, CKMBINDEX, TROPONINI in the last 168 hours. BNP (last 3 results) No results for input(s): BNP in the last 8760 hours.  ProBNP (last 3 results) No results for input(s): PROBNP in the last 8760 hours.  CBG:  Recent Labs Lab 09/28/14 0759 09/28/14 1147 09/28/14 1626 09/28/14 2153 09/29/14 0755  GLUCAP 200* 166* 146* 169* 178*    Recent Results (from the past 240 hour(s))  Blood culture (routine x 2)     Status: None (Preliminary result)   Collection Time: 09/28/14 12:30 AM  Result Value Ref Range Status   Specimen Description BLOOD RIGHT FOREARM  Final   Special Requests   Final    BOTTLES DRAWN AEROBIC AND ANAEROBIC 10CC AEROBIC 5CC ANAEROBIC   Culture  Setup Time   Final  CYTOSPUN GRAM NEGATIVE RODS AEROBIC BOTTLE ONLY CRITICAL RESULT CALLED TO, READ BACK BY AND VERIFIED WITH: Bryna Colander [id number] [facility]    Culture PENDING  Incomplete   Report Status PENDING  Incomplete  Blood culture (routine x 2)     Status: None (Preliminary result)   Collection Time: 09/28/14 12:36 AM  Result Value Ref Range Status   Specimen Description BLOOD LEFT HAND  Final   Special Requests BOTTLES DRAWN AEROBIC AND ANAEROBIC 5CC EA  Final   Culture PENDING  Incomplete   Report Status PENDING  Incomplete     Studies: Dg Chest 2 View  09/28/2014   CLINICAL DATA:  74 year old female with fever and chills.  EXAM: CHEST  2 VIEW  COMPARISON:  08/04/2014 sign  FINDINGS: The heart size and mediastinal contours are within normal limits. Both lungs  are clear. Osteopenia with degenerative changes of the spine.  IMPRESSION: No active cardiopulmonary disease.   Electronically Signed   By: Elgie Collard M.D.   On: 09/28/2014 00:26   Dg Humerus Left  09/28/2014   CLINICAL DATA:  74 year old female with left arm pain  EXAM: LEFT HUMERUS - 2+ VIEW  COMPARISON:  Radiograph dated 08/04/2014  FINDINGS: There is a hint displaced fracture of the left humeral neck similar to the prior study. Ninety fracture identified. The soft tissues are grossly unremarkable.  IMPRESSION: Displaced fracture of the left humeral neck similar to prior study. No new fracture.   Electronically Signed   By: Elgie Collard M.D.   On: 09/28/2014 00:29    Scheduled Meds: . heparin  5,000 Units Subcutaneous 3 times per day  . HYDROcodone-acetaminophen  2 tablet Oral Once  . insulin aspart  0-9 Units Subcutaneous TID WC  . insulin glargine  2 Units Subcutaneous QHS  . piperacillin-tazobactam (ZOSYN)  IV  3.375 g Intravenous 3 times per day  . sodium chloride  3 mL Intravenous Q12H   Continuous Infusions: . sodium chloride      Principal Problem:   Sepsis Active Problems:   Fever   Fx humeral neck   Diabetes mellitus without complication   Headache   Pyrexia   Protein-calorie malnutrition, severe    Time spent: 35 minutes. Greater than 50% of this time was spent in direct contact with the patient coordinating care.    Chaya Jan  [facility] Pager [phone]  If 7PM-7AM, please contact night-coverage at [email or web address], password [facility] 09/29/2014, 10:56 AM  LOS: 1 day

## 2014-09-29 NOTE — Consult Note (Signed)
[facility]    Date of Admission:  09/27/2014   Day 3 vancomycin        Day 3 piperacillin/tazobactam        Day 3 doxycycline  Principal Problem:   Sepsis Active Problems:   Fever   Fx humeral neck   Diabetes mellitus without complication   Headache   Pyrexia   Protein-calorie malnutrition, severe   . doxycycline (VIBRAMYCIN) IV  100 mg Intravenous Q12H  . heparin  5,000 Units Subcutaneous 3 times per day  . HYDROcodone-acetaminophen  2 tablet Oral Once  . insulin aspart  0-9 Units Subcutaneous TID WC  . insulin glargine  2 Units Subcutaneous QHS  . piperacillin-tazobactam (ZOSYN)  IV  3.375 g Intravenous 3 times per day  . sodium chloride  3 mL Intravenous Q12H    Subjective: Ms. Laymond Purserscobar-Guevara is a 74 year-old lady admitted 7/17 for fever, left shoulder pain, and headache. She began feeling chills on 7/14 which progressed over three days. During that time she has felt a posterior headache which has worsened slightly. She denies neck stiffness, changes in vision, or rash. She denies cough, chest pain, abdominal pain, diarrhea, or dysuria since that time. She has not had a bowel movement in four days. Her left shoulder pain has not changed since she fell and fractured her humeral head two months ago.  Blood cultures were positive for gram negative rods. She is currently on vancomycin, piperacillin/tazobactam, and doxycycline.  Review of Systems  Constitutional: Positive for fever, chills and malaise/fatigue.  HENT: Negative for congestion and ear pain.   Eyes: Negative for blurred vision and photophobia.  Respiratory: Negative for cough.   Cardiovascular: Negative for chest pain, palpitations and orthopnea.  Gastrointestinal: Positive for nausea and constipation. Negative for vomiting, abdominal pain and diarrhea.  Genitourinary: Negative for dysuria, urgency and frequency.  Skin: Negative for rash.  Neurological: Positive for  headaches.    Past Medical History  Diagnosis Date  . Diabetes mellitus without complication   . Fx humeral neck     History  Substance Use Topics  . Smoking status: Never Smoker   . Smokeless tobacco: Not on file  . Alcohol Use: No    Family History  Problem Relation Age of Onset  . Heart disease Sister    No Known Allergies  OBJECTIVE: Blood pressure 172/65, pulse 78, temperature 98.1 F (36.7 C), temperature source Oral, resp. rate 20, height 5\' 5"  (1.651 m), weight 90.674 kg (199 lb 14.4 oz), SpO2 95 %. General: Sleeping in bed. Skin: Dry, intact. No rash. Lungs: Clear anteriorly. Cor: Regular rate and rhythm, no murmurs. Abdomen: Soft, non-distended, non-tender to palpation. MSK: Left upper arm warm to touch. No induration or apparent signs of infection. Left knee with two small scabs, no effusion.  Lab Results Lab Results  Component Value Date   WBC 10.5 09/29/2014   HGB 13.2 09/29/2014   HCT 41.1 09/29/2014   MCV 79.3 09/29/2014   PLT 192 09/29/2014    Lab Results  Component Value Date   CREATININE 0.70 09/29/2014   BUN 8 09/29/2014   NA 134* 09/29/2014   K 3.4* 09/29/2014   CL 100* 09/29/2014   CO2 24 09/29/2014    Lab Results  Component Value Date   ALT 36 09/28/2014   AST 70* 09/28/2014   ALKPHOS 96 09/28/2014   BILITOT 1.1 09/28/2014     Microbiology: Recent Results (from  the past 240 hour(s))  Blood culture (routine x 2)     Status: None (Preliminary result)   Collection Time: 09/28/14 12:30 AM  Result Value Ref Range Status   Specimen Description BLOOD RIGHT FOREARM  Final   Special Requests   Final    BOTTLES DRAWN AEROBIC AND ANAEROBIC 10CC AEROBIC 5CC ANAEROBIC   Culture  Setup Time   Final    CYTOSPUN GRAM NEGATIVE RODS AEROBIC BOTTLE ONLY CRITICAL RESULT CALLED TO, READ BACK BY AND VERIFIED WITH: Bryna Colander [id number] [facility]    Culture PENDING  Incomplete   Report Status PENDING  Incomplete  Blood culture (routine x 2)      Status: None (Preliminary result)   Collection Time: 09/28/14 12:36 AM  Result Value Ref Range Status   Specimen Description BLOOD LEFT HAND  Final   Special Requests BOTTLES DRAWN AEROBIC AND ANAEROBIC 5CC EA  Final   Culture PENDING  Incomplete   Report Status PENDING  Incomplete    Assessment: The source of Ms. Jacqualyn Posey gram negative rod bacteremia is unknown. Her urinalysis was clean and she has no abdominal complaints besides mild constipation.  Although septic arthritis of her left humerus would be an unusual source of her gram negative rod bacteremia, it remains on the differential so we agree with Orthopedic's recommendation of obtaining a CT of the left shoulder. Nonetheless we cannot confidently rule out Pseudomonas so we recommend continuing coverage with piperacillin/tazobactam and the vancomycin and doxycycline can be stopped.   Plan: 1. We agree with ordering a CT scan of the left shoulder 2. Continue piperacillin/tazobactam 3. Discontinue vancomycin and doxycycline 4. We will follow culture and imagine results  Agree with Dr. Earnest Conroy.  No obvious source.  CT but if no findings, there is no indication to continue further work up.    Selina Cooley, MD [facility] 09/29/2014, 10:28 AM

## 2014-09-29 NOTE — Progress Notes (Signed)
Utilization review completed. Delvon Chipps, RN, BSN. 

## 2014-09-30 DIAGNOSIS — A419 Sepsis, unspecified organism: Secondary | ICD-10-CM

## 2014-09-30 DIAGNOSIS — M25562 Pain in left knee: Secondary | ICD-10-CM

## 2014-09-30 DIAGNOSIS — I10 Essential (primary) hypertension: Secondary | ICD-10-CM

## 2014-09-30 DIAGNOSIS — E119 Type 2 diabetes mellitus without complications: Secondary | ICD-10-CM

## 2014-09-30 DIAGNOSIS — A4150 Gram-negative sepsis, unspecified: Secondary | ICD-10-CM

## 2014-09-30 LAB — GLUCOSE, CAPILLARY
GLUCOSE-CAPILLARY: 166 mg/dL — AB (ref 65–99)
Glucose-Capillary: 99 mg/dL (ref 65–99)
Glucose-Capillary: 124 mg/dL — ABNORMAL HIGH (ref 65–99)
Glucose-Capillary: 132 mg/dL — ABNORMAL HIGH (ref 65–99)

## 2014-09-30 LAB — CBC
HCT: 40.6 % (ref 36.0–46.0)
Hemoglobin: 13.2 g/dL (ref 12.0–15.0)
MCH: 25.7 pg — ABNORMAL LOW (ref 26.0–34.0)
MCHC: 32.5 g/dL (ref 30.0–36.0)
MCV: 79 fL (ref 78.0–100.0)
Platelets: 224 10*3/uL (ref 150–400)
RBC: 5.14 MIL/uL — ABNORMAL HIGH (ref 3.87–5.11)
RDW: 13.9 % (ref 11.5–15.5)
WBC: 8.9 10*3/uL (ref 4.0–10.5)

## 2014-09-30 LAB — BASIC METABOLIC PANEL
Anion gap: 10 (ref 5–15)
BUN: 9 mg/dL (ref 6–20)
CO2: 24 mmol/L (ref 22–32)
CREATININE: 0.76 mg/dL (ref 0.44–1.00)
Calcium: 8.2 mg/dL — ABNORMAL LOW (ref 8.9–10.3)
Chloride: 103 mmol/L (ref 101–111)
GFR calc Af Amer: >60 mL/min (ref >60)
GFR calc non Af Amer: >60 mL/min (ref >60)
Glucose, Bld: 106 mg/dL — ABNORMAL HIGH (ref 65–99)
POTASSIUM: 3.3 mmol/L — AB (ref 3.5–5.1)
Sodium: 137 mmol/L (ref 135–145)

## 2014-09-30 MED ORDER — BISACODYL 10 MG RE SUPP
10.0000 mg | Freq: Once | RECTAL | Status: AC
  Administered 2014-09-30: 10 mg via RECTAL
  Filled 2014-09-30: qty 1

## 2014-09-30 MED ORDER — POTASSIUM CHLORIDE CRYS ER 20 MEQ PO TBCR
40.0000 meq | EXTENDED_RELEASE_TABLET | Freq: Once | ORAL | Status: AC
  Administered 2014-09-30: 40 meq via ORAL
  Filled 2014-09-30: qty 2

## 2014-09-30 MED ORDER — DOCUSATE SODIUM 100 MG PO CAPS
100.0000 mg | ORAL_CAPSULE | Freq: Two times a day (BID) | ORAL | Status: DC
  Administered 2014-09-30 (×2): 100 mg via ORAL
  Filled 2014-09-30 (×4): qty 1

## 2014-09-30 MED ORDER — LACTULOSE 10 GM/15ML PO SOLN
20.0000 g | Freq: Once | ORAL | Status: AC
  Administered 2014-09-30: 20 g via ORAL
  Filled 2014-09-30: qty 30

## 2014-09-30 MED ORDER — GLUCERNA SHAKE PO LIQD
237.0000 mL | Freq: Two times a day (BID) | ORAL | Status: DC
  Administered 2014-09-30: 237 mL via ORAL

## 2014-09-30 MED ORDER — LISINOPRIL 10 MG PO TABS
10.0000 mg | ORAL_TABLET | Freq: Every day | ORAL | Status: DC
  Administered 2014-09-30: 10 mg via ORAL
  Filled 2014-09-30 (×2): qty 1

## 2014-09-30 NOTE — Progress Notes (Signed)
Nutrition Follow-up  DOCUMENTATION CODES:   Severe malnutrition in context of acute illness/injury, Obesity unspecified  INTERVENTION:   Provide Glucerna Shake po BID, each supplement provides 220 kcal and 10 grams of protein.  Provide nourishment snacks. (ordered)  NUTRITION DIAGNOSIS:   Inadequate oral intake related to poor appetite as evidenced by per patient/family report; ongoing  GOAL:   Patient will meet greater than or equal to 90% of their needs; progressing  MONITOR:   PO intake, Supplement acceptance, Weight trends, Labs, I & O's  REASON FOR ASSESSMENT:   Malnutrition Screening Tool    ASSESSMENT:   74 y.o. female with PMH of diabetes and hypertension, not taking any medications currently, who presents with fever, chills, headache, left shoulder pain.  Diet has been advanced. Meal completion has been 25-50%. Pt reports her appetite has been improving. Pt is agreeable to supplements/nourishment snacks. RD to order. Pt was encouraged to eat her food at meals.  Labs and medications reviewed.  Diet Order:  Diet heart healthy/carb modified Room service appropriate?: Yes; Fluid consistency:: Thin  Skin:  Reviewed, no issues  Last BM:  7/17  Height:   Ht Readings from Last 1 Encounters:  09/29/14 5\' 6"  (1.676 m)    Weight:   Wt Readings from Last 1 Encounters:  09/29/14 210 lb 4.8 oz (95.391 kg)    Ideal Body Weight:  56.8 kg  Wt Readings from Last 10 Encounters:  09/29/14 210 lb 4.8 oz (95.391 kg)  08/04/14 227 lb (102.967 kg)    BMI:  Body mass index is 33.96 kg/(m^2).  Estimated Nutritional Needs:   Kcal:  1850-2050  Protein:  100-110 grams  Fluid:  1.9 - 2 L/day  EDUCATION NEEDS:   No education needs identified at this time  Roslyn SmilingStephanie Iretha Kirley, MS, RD, LDN Pager # [phone] After hours/ weekend pager # [phone]

## 2014-09-30 NOTE — Progress Notes (Signed)
[facility] PROGRESS NOTE  Meghan Ford ZOX:[id number] DOB: April 28, 1940 DOA: 09/27/2014 PCP: No PCP Per Patient  Assessment/Plan: 1. Sepsis -Present on admission, evidenced by a venous lactate of 2.5 with temperature 102 along with 102 blood cultures growing gram-negative rods -Workup as far has included a chest x-ray urinalysis which have come back negative. -Doubt source of infection coming from shoulder as she had a closed fracture -Infectious disease consulted -She remains on IV Zosyn as vancomycin and doxycycline were discontinued -She remains hemodynamically stable and afebrile for the past 48 hours  2. Comminuted proximal left humerus fracture -Patient having a mechanical fall back in May 2016 undergoing close management. 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 Specifically there is no evidence of osteomyelitis to indicate left shoulder as source of her infection. -Patient was seen and evaluated by orthopedic surgery who did not recommend surgical intervention at this time due to presence of positive blood cultures -She will need close outpatient follow-up  3.  Hypertension -Blood pressures elevated -Will increase lisinopril 5 mg by mouth daily to 10 mg PO q daily  4.  Type 2 diabetes mellitus -Blood sugars are stable, continue Lantus 8 units subcutaneous daily  Code Status: Full code Family Communication: I spoke with her grandson was present at bedside Disposition Plan: Anticipate discharge home when medically stable   Consultants:  Infectious disease  Orthopedic surgery    Antibiotics:  Zosyn  HPI/Subjective: Patient is a pleasant 74 year old female who was admitted to the medicine service on 09/27/2014 when she presented with complaints of fevers, chills, malaise along with left shoulder pain. She  reported having a mechanical fall back in May 2016 that resulted in a left humeral neck fracture where she was treated at [facility] undergoing close management. She was found to be septic on admission having lactic acid of 2.5 with temperature 102 and started on broad-spectrum IV antimicrobial therapy with vancomycin, Zosyn and doxycycline. Urinalysis was negative. Chest x-ray did not reveal acute cardiopulmonary disease. Blood cultures grew 1 out of 2 gram-negative rods, infectious disease consulted.  Objective: Filed Vitals:   09/30/14 0808  BP: 156/72  Pulse: 72  Temp: 98.1 F (36.7 C)  Resp: 17    Intake/Output Summary (Last 24 hours) at 09/30/14 0845 Last data filed at 09/30/14 8119  Gross per 24 hour  Intake 1601.67 ml  Output   1850 ml  Net -248.33 ml   Filed Weights   09/28/14 0203 09/29/14 2029  Weight: 90.674 kg (199 lb 14.4 oz) 95.391 kg (210 lb 4.8 oz)    Exam:   General:  She is in no acute distress awake and alert oriented 3  Cardiovascular: Regular rate and rhythm normal S1-S2  Respiratory: Normal respiratory effort, lungs are clear to auscultation bilaterally  Abdomen: Soft nontender nondistended  Musculoskeletal: She has pain with passive and active movement to her left upper extremity, there is no evidence of infection involving left upper extremity. No extremity edema  Data Reviewed: Basic Metabolic Panel:  Recent Labs Lab 09/27/14 2321 09/28/14 0312 09/29/14 0535 09/30/14 0406  NA 132* 135 134* 137  K 3.8 3.6 3.4* 3.3*  CL 95* 101 100* 103  CO2 GLUCOSE 214* 222* 136* 106*  BUN CREATININE 0.75 0.72 0.70 0.76  CALCIUM 9.0 8.4* 8.3* 8.2*   Liver  Function Tests:  Recent Labs Lab 09/27/14 2321 09/28/14 0312  AST 44* 70*  ALT 30 36  ALKPHOS 101 96  BILITOT 0.7 1.1  PROT 7.2 5.7*  ALBUMIN 3.2* 2.7*   No results for input(s): LIPASE, AMYLASE in the last 168 hours. No results for input(s): AMMONIA in the  last 168 hours. CBC:  Recent Labs Lab 09/27/14 2321 09/28/14 0312 09/29/14 0535 09/30/14 0406  WBC 9.7 9.5 10.5 8.9  NEUTROABS 8.2*  --   --   --   HGB 14.1 13.2 13.2 13.2  HCT 43.8 40.8 41.1 40.6  MCV 80.7 79.8 79.3 79.0  PLT 195 168 192 224   Cardiac Enzymes: No results for input(s): CKTOTAL, CKMB, CKMBINDEX, TROPONINI in the last 168 hours. BNP (last 3 results) No results for input(s): BNP in the last 8760 hours.  ProBNP (last 3 results) No results for input(s): PROBNP in the last 8760 hours.  CBG:  Recent Labs Lab 09/29/14 0755 09/29/14 1158 09/29/14 1609 09/29/14 2116 09/30/14 0732  GLUCAP 178* 147* 147* 136* 99    Recent Results (from the past 240 hour(s))  Urine culture     Status: None   Collection Time: 09/27/14 11:43 PM  Result Value Ref Range Status   Specimen Description URINE, RANDOM  Final   Special Requests NONE  Final   Culture   Final    MULTIPLE SPECIES PRESENT, SUGGEST RECOLLECTION IF CLINICALLY INDICATED   Report Status 09/29/2014 FINAL  Final  Blood culture (routine x 2)     Status: None (Preliminary result)   Collection Time: 09/28/14 12:30 AM  Result Value Ref Range Status   Specimen Description BLOOD RIGHT FOREARM  Final   Special Requests   Final    BOTTLES DRAWN AEROBIC AND ANAEROBIC 10CC AEROBIC 5CC ANAEROBIC   Culture  Setup Time   Final    GRAM NEGATIVE RODS AEROBIC BOTTLE ONLY CRITICAL RESULT CALLED TO, READ BACK BY AND VERIFIED WITH: Z [facility],RN [phone] WILDERK    Culture GRAM NEGATIVE RODS  Final   Report Status PENDING  Incomplete  Blood culture (routine x 2)     Status: None (Preliminary result)   Collection Time: 09/28/14 12:36 AM  Result Value Ref Range Status   Specimen Description BLOOD LEFT HAND  Final   Special Requests BOTTLES DRAWN AEROBIC AND ANAEROBIC 5CC EA  Final   Culture NO GROWTH 1 DAY  Final   Report Status PENDING  Incomplete     Studies: Ct Shoulder Left W Contrast  09/29/2014   CLINICAL  DATA:  Left shoulder pain. Chronic proximal left humerus fracture. Fever and chills.  EXAM: CT OF THE LEFT SHOULDER WITH CONTRAST  TECHNIQUE: Multidetector CT imaging was performed following the standard protocol during bolus administration of intravenous contrast.  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 The remnants of the humeral head are rotated and displaced with overriding of the proximal shaft extending anterior to the rotated humeral head. The fracture has not healed.  However, there are no findings to suggest osteomyelitis. There is increased soft tissue density in the glenohumeral joint which is most likely synovial hypertrophy. The muscles of the rotator cuff demonstrate no atrophy. There is only slight arthropathy of the acromioclavicular joint. No adenopathy. Subcortical cyst formation at the inferior aspect of the glenoid is felt to be  degenerative in origin.  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 Arthritic changes of the glenoid and AC joint.   Electronically Signed   By: Francene Boyers M.D.   On: 09/29/2014 15:41    Scheduled Meds: . bisacodyl  10 mg Rectal Once  . docusate sodium  100 mg Oral BID  . heparin  5,000 Units Subcutaneous 3 times per day  . HYDROcodone-acetaminophen  2 tablet Oral Once  . insulin aspart  0-9 Units Subcutaneous TID WC  . insulin glargine  8 Units Subcutaneous QHS  . lactulose  20 g Oral Once  . lisinopril  5 mg Oral Daily  . piperacillin-tazobactam (ZOSYN)  IV  3.375 g Intravenous 3 times per day  . sodium chloride  3 mL Intravenous Q12H   Continuous Infusions: . sodium chloride 100 mL/hr at 09/30/14 1610    Principal Problem:   Sepsis Active Problems:    Fever   Fx humeral neck   Diabetes mellitus without complication   Headache   Pyrexia   Protein-calorie malnutrition, severe    Time spent: 35 min    Meghan Ford  [facility] Pager [phone]. If 7PM-7AM, please contact night-coverage at [email or web address], password [facility] 09/30/2014, 8:45 AM  LOS: 2 days

## 2014-09-30 NOTE — Progress Notes (Signed)
Patient ID: Meghan Ford, female   DOB: 1940/11/24, 74 y.o.   MRN: [id number]         [facility]    Date of Admission:  09/27/2014   Day 4 piperacillin/tazobactam for gram negative rod sepsis  Principal Problem:   Sepsis Active Problems:   Fever   Fx humeral neck   Diabetes mellitus without complication   Headache   Pyrexia   Protein-calorie malnutrition, severe   . bisacodyl  10 mg Rectal Once  . docusate sodium  100 mg Oral BID  . heparin  5,000 Units Subcutaneous 3 times per day  . HYDROcodone-acetaminophen  2 tablet Oral Once  . insulin aspart  0-9 Units Subcutaneous TID WC  . insulin glargine  8 Units Subcutaneous QHS  . lactulose  20 g Oral Once  . lisinopril  10 mg Oral Daily  . piperacillin-tazobactam (ZOSYN)  IV  3.375 g Intravenous 3 times per day  . sodium chloride  3 mL Intravenous Q12H    Subjective: Meghan Ford is feeling much better today. She denies fever or chills and she says her headache has completed passed. Her left knee and arm continue to hurt a little but are unchanged from yesterday.  Review of Systems: Pertinent items are noted in HPI.  Past Medical History  Diagnosis Date  . Diabetes mellitus without complication   . Fx humeral neck     History  Substance Use Topics  . Smoking status: Never Smoker   . Smokeless tobacco: Not on file  . Alcohol Use: No    Family History  Problem Relation Age of Onset  . Heart disease Sister    No Known Allergies  OBJECTIVE: Blood pressure 156/72, pulse 72, temperature 98.1 F (36.7 C), temperature source Oral, resp. rate 17, height  (1.676 m), weight 95.391 kg (210 lb 4.8 oz), SpO2 96 %. General: Sleeping in bed. Skin: Dry, intact. Lungs: Clear anteriorly. Cor: Regular rate and rhythm, no murmurs. Abdomen: Soft, non-tender. MSK: Left knee and shoulder unchanged from yesterday.  Lab Results Lab Results  Component Value Date   WBC 8.9  09/30/2014   HGB 13.2 09/30/2014   HCT 40.6 09/30/2014   MCV 79.0 09/30/2014   PLT 224 09/30/2014    Lab Results  Component Value Date   CREATININE 0.76 09/30/2014   BUN 9 09/30/2014   NA 137 09/30/2014   K 3.3* 09/30/2014   CL 103 09/30/2014   CO2 24 09/30/2014    Lab Results  Component Value Date   ALT 36 09/28/2014   AST 70* 09/28/2014   ALKPHOS 96 09/28/2014   BILITOT 1.1 09/28/2014     Microbiology: Recent Results (from the past 240 hour(s))  Urine culture     Status: None   Collection Time: 09/27/14 11:43 PM  Result Value Ref Range Status   Specimen Description URINE, RANDOM  Final   Special Requests NONE  Final   Culture   Final    MULTIPLE SPECIES PRESENT, SUGGEST RECOLLECTION IF CLINICALLY INDICATED   Report Status 09/29/2014 FINAL  Final  Blood culture (routine x 2)     Status: None (Preliminary result)   Collection Time: 09/28/14 12:30 AM  Result Value Ref Range Status   Specimen Description BLOOD RIGHT FOREARM  Final   Special Requests   Final    BOTTLES DRAWN AEROBIC AND ANAEROBIC 10CC AEROBIC 5CC ANAEROBIC   Culture  Setup Time   Final    GRAM NEGATIVE RODS  AEROBIC BOTTLE ONLY CRITICAL RESULT CALLED TO, READ BACK BY AND VERIFIED WITH: Z Colman CaterMOHAMOD,RN [phone] WILDERK    Culture GRAM NEGATIVE RODS  Final   Report Status PENDING  Incomplete  Blood culture (routine x 2)     Status: None (Preliminary result)   Collection Time: 09/28/14 12:36 AM  Result Value Ref Range Status   Specimen Description BLOOD LEFT HAND  Final   Special Requests BOTTLES DRAWN AEROBIC AND ANAEROBIC 5CC EA  Final   Culture NO GROWTH 1 DAY  Final   Report Status PENDING  Incomplete    Assessment: The source of Meghan Ford's gram negative rod sepsis remains unknown and cultures are pending. Clinically, she is much-improved today. The CT of her shoulder yesterday was negative for osteomyelitis. We recommend sending a new source of blood cultures today. If positive, we may have  to continue searching for the source.  Plan: 1. Order new set of blood cultures 2. Continue piperacillin/tazobactam for now 3. We will tailor antibiotics pending culture results  Agree with Dr. Earnest ConroyFlores' note.  No obvious source and I anticipate discharge on oral therapy.  She had expressed desire to follow up with Dr. Eulah PontMurphy for her shoulder.    Selina CooleyKyle Flores, MD [facility] 09/30/2014, 9:51 AM

## 2014-10-01 DIAGNOSIS — A4151 Sepsis due to Escherichia coli [E. coli]: Principal | ICD-10-CM

## 2014-10-01 DIAGNOSIS — S42212A Unspecified displaced fracture of surgical neck of left humerus, initial encounter for closed fracture: Secondary | ICD-10-CM

## 2014-10-01 LAB — BASIC METABOLIC PANEL
Anion gap: 10 (ref 5–15)
BUN: 10 mg/dL (ref 6–20)
CO2: 25 mmol/L (ref 22–32)
Calcium: 8.4 mg/dL — ABNORMAL LOW (ref 8.9–10.3)
Chloride: 105 mmol/L (ref 101–111)
Creatinine, Ser: 0.69 mg/dL (ref 0.44–1.00)
GFR calc Af Amer: >60 mL/min (ref >60)
GFR calc non Af Amer: >60 mL/min (ref >60)
Glucose, Bld: 105 mg/dL — ABNORMAL HIGH (ref 65–99)
Potassium: 3.4 mmol/L — ABNORMAL LOW (ref 3.5–5.1)
SODIUM: 140 mmol/L (ref 135–145)

## 2014-10-01 LAB — GLUCOSE, CAPILLARY
GLUCOSE-CAPILLARY: 120 mg/dL — AB (ref 65–99)
Glucose-Capillary: 140 mg/dL — ABNORMAL HIGH (ref 65–99)
Glucose-Capillary: 127 mg/dL — ABNORMAL HIGH (ref 65–99)

## 2014-10-01 LAB — CBC
HCT: 39.7 % (ref 36.0–46.0)
Hemoglobin: 12.7 g/dL (ref 12.0–15.0)
MCH: 25.3 pg — ABNORMAL LOW (ref 26.0–34.0)
MCHC: 32 g/dL (ref 30.0–36.0)
MCV: 79.1 fL (ref 78.0–100.0)
Platelets: 239 10*3/uL (ref 150–400)
RBC: 5.02 MIL/uL (ref 3.87–5.11)
RDW: 14 % (ref 11.5–15.5)
WBC: 7.7 10*3/uL (ref 4.0–10.5)

## 2014-10-01 LAB — CULTURE, BLOOD (ROUTINE X 2)

## 2014-10-01 MED ORDER — INSULIN GLARGINE 100 UNIT/ML ~~LOC~~ SOLN: 8.0000 [IU] | Freq: Every day | SUBCUTANEOUS | Status: DC

## 2014-10-01 MED ORDER — LISINOPRIL 20 MG PO TABS: 20.0000 mg | ORAL_TABLET | Freq: Every day | ORAL | Status: DC

## 2014-10-01 MED ORDER — CIPROFLOXACIN HCL 500 MG PO TABS: 500.0000 mg | ORAL_TABLET | Freq: Two times a day (BID) | ORAL | Status: DC

## 2014-10-01 MED ORDER — LISINOPRIL 20 MG PO TABS
20.0000 mg | ORAL_TABLET | Freq: Every day | ORAL | Status: DC
  Administered 2014-10-01: 20 mg via ORAL
  Filled 2014-10-01: qty 1

## 2014-10-01 NOTE — Care Management Note (Signed)
Case Management Note  Patient Details  Name: Angelyse Heslin MRN: [id number] Date of Birth: 09-21-1940  Subjective/Objective:         CM following for progression and d/c planning.           Action/Plan: Appointment scheduled at [facility].  Expected Discharge Date:       10/01/2014           Expected Discharge Plan:  Home/Self Care  In-House Referral:  NA  Discharge planning Services  CM Consult, Follow-up appt scheduled  Post Acute Care Choice:  NA Choice offered to:  NA  DME Arranged:    DME Agency:     HH Arranged:    HH Agency:     Status of Service:  Completed, signed off  Medicare Important Message Given:    Date Medicare IM Given:    Medicare IM give by:    Date Additional Medicare IM Given:    Additional Medicare Important Message give by:     If discussed at Long Length of Stay Meetings, dates discussed:    Additional Comments:  Starlyn Skeans, RN 10/01/2014, 2:36 PM

## 2014-10-01 NOTE — Progress Notes (Signed)
Discharged and discharge instructions in [place] and Spanish version printed out,with the help of interpreter given and explained to the patient and her grandchildren.With the help of interpreter questions were answered to the patient and her grandchildren satisfaction.

## 2014-10-01 NOTE — Progress Notes (Signed)
Patient ID: Meghan Ford, female   DOB: 1940-12-31, 74 y.o.   MRN: [id number]         [facility]    Date of Admission:  09/27/2014   Day 5 piperacillin/tazobactam for gram negative rod sepsis  Principal Problem:   Sepsis Active Problems:   Fever   Fx humeral neck   Diabetes mellitus without complication   Headache   Pyrexia   Protein-calorie malnutrition, severe   . feeding supplement (GLUCERNA SHAKE)  237 mL Oral BID BM  . heparin  5,000 Units Subcutaneous 3 times per day  . HYDROcodone-acetaminophen  2 tablet Oral Once  . insulin aspart  0-9 Units Subcutaneous TID WC  . insulin glargine  8 Units Subcutaneous QHS  . lisinopril  20 mg Oral Daily  . piperacillin-tazobactam (ZOSYN)  IV  3.375 g Intravenous 3 times per day  . sodium chloride  3 mL Intravenous Q12H    Subjective: Meghan Ford says she is feeling much better today. Her knee and arm are still bothering her a little bit. She continues to deny abdominal pain or dysuria. No fevers or chills overnight. She would like to talk to the Orthopedics team about surgical repair options on her shoulder.  Review of Systems: Pertinent items are noted in HPI.  Past Medical History  Diagnosis Date  . Diabetes mellitus without complication   . Fx humeral neck     History  Substance Use Topics  . Smoking status: Never Smoker   . Smokeless tobacco: Not on file  . Alcohol Use: No    Family History  Problem Relation Age of Onset  . Heart disease Sister    No Known Allergies  OBJECTIVE: Blood pressure 155/68, pulse 72, temperature 98.9 F (37.2 C), temperature source Oral, resp. rate 18, height 5\' 6"  (1.676 m), weight 95.5 kg (210 lb 8.6 oz), SpO2 97 %. General: Sitting in recliner. Skin: Dry, intact. Dusky, diffusely red-brown plaques on bilateral lower extremities. Lungs: Clear anteriorly. Cor: Regular rate, no murmurs. Abdomen: Soft, non-tender.  Lab Results Lab Results   Component Value Date   WBC 7.7 10/01/2014   HGB 12.7 10/01/2014   HCT 39.7 10/01/2014   MCV 79.1 10/01/2014   PLT 239 10/01/2014    Lab Results  Component Value Date   CREATININE 0.69 10/01/2014   BUN 10 10/01/2014   NA 140 10/01/2014   K 3.4* 10/01/2014   CL 105 10/01/2014   CO2 25 10/01/2014    Lab Results  Component Value Date   ALT 36 09/28/2014   AST 70* 09/28/2014   ALKPHOS 96 09/28/2014   BILITOT 1.1 09/28/2014     Microbiology: Recent Results (from the past 240 hour(s))  Urine culture     Status: None   Collection Time: 09/27/14 11:43 PM  Result Value Ref Range Status   Specimen Description URINE, RANDOM  Final   Special Requests NONE  Final   Culture   Final    MULTIPLE SPECIES PRESENT, SUGGEST RECOLLECTION IF CLINICALLY INDICATED   Report Status 09/29/2014 FINAL  Final  Blood culture (routine x 2)     Status: None   Collection Time: 09/28/14 12:30 AM  Result Value Ref Range Status   Specimen Description BLOOD RIGHT FOREARM  Final   Special Requests   Final    BOTTLES DRAWN AEROBIC AND ANAEROBIC 10CC AEROBIC 5CC ANAEROBIC   Culture  Setup Time   Final    GRAM NEGATIVE RODS AEROBIC BOTTLE ONLY  CRITICAL RESULT CALLED TO, READ BACK BY AND VERIFIED WITH: Bryna Colander [phone] [facility]    Culture ESCHERICHIA COLI  Final   Report Status 10/01/2014 FINAL  Final   Organism ID, Bacteria ESCHERICHIA COLI  Final      Susceptibility   Escherichia coli - MIC*    AMPICILLIN <=2 SENSITIVE Sensitive     CEFAZOLIN <=4 SENSITIVE Sensitive     CEFEPIME <=1 SENSITIVE Sensitive     CEFTAZIDIME <=1 SENSITIVE Sensitive     CEFTRIAXONE <=1 SENSITIVE Sensitive     CIPROFLOXACIN <=0.25 SENSITIVE Sensitive     GENTAMICIN <=1 SENSITIVE Sensitive     IMIPENEM <=0.25 SENSITIVE Sensitive     TRIMETH/SULFA <=20 SENSITIVE Sensitive     AMPICILLIN/SULBACTAM <=2 SENSITIVE Sensitive     PIP/TAZO <=4 SENSITIVE Sensitive     * ESCHERICHIA COLI  Blood culture (routine x 2)      Status: None (Preliminary result)   Collection Time: 09/28/14 12:36 AM  Result Value Ref Range Status   Specimen Description BLOOD LEFT HAND  Final   Special Requests BOTTLES DRAWN AEROBIC AND ANAEROBIC 5CC EA  Final   Culture NO GROWTH 2 DAYS  Final   Report Status PENDING  Incomplete  Culture, blood (routine x 2)     Status: None (Preliminary result)   Collection Time: 09/30/14  3:50 PM  Result Value Ref Range Status   Specimen Description BLOOD RIGHT HAND  Final   Special Requests IN PEDIATRIC BOTTLE  2CC  Final   Culture PENDING  Incomplete   Report Status PENDING  Incomplete    Assessment: The source of Meghan Ford's E coli sepsis remains unknown but she has improved considerably after 5 days of piperacillin/tazobactam. We can discontinue this now that we have ruled out Pseudomonas. Her blood cultures drawn yesterday remain pending so I will follow up this afternoon. We recommend 5 more days of oral ciprofloxacin. She would like to speak to Orthopedics about surgery options for her shoulder.  Plan: 1. We recommend discontinuing the Zosyn 2. We recommend 5 more days of oral ciprofloxacin (stop date 7/26)  Agree with above note.    Selina Cooley, MD [facility] 10/01/2014, 10:57 AM

## 2014-10-01 NOTE — Discharge Summary (Signed)
Physician Discharge Summary  Meghan Ford AVW:[id number] DOB: 05-16-1940 DOA: 09/27/2014  PCP: No PCP Per Patient  Admit date: 09/27/2014 Discharge date: 10/01/2014  Time spent: 35 minutes  Recommendations for Outpatient Follow-up:  1. Patient having comminuted proximal left humerus fracture secondary to mechanical fall in May 2016 undergoing close management. She expressed wishes to follow-up with Dr. Eulah Pont, rather than going back to [facility] 2. Please follow-up on repeat blood cultures drawn on 09/30/2014 3. Case manager was consulted to set up with local PCP for hospital follow-up   Discharge Diagnoses:  Principal Problem:   Sepsis Active Problems:   Fever   Fx humeral neck   Diabetes mellitus without complication   Headache   Pyrexia   Protein-calorie malnutrition, severe   Discharge Condition: Stable  Diet recommendation: Heart healthy  Filed Weights   09/28/14 0203 09/29/14 2029 09/30/14 2039  Weight: 90.674 kg (199 lb 14.4 oz) 95.391 kg (210 lb 4.8 oz) 95.5 kg (210 lb 8.6 oz)    History of present illness:  Meghan Ford is a 74 y.o. female with PMH of diabetes and hypertension, not taking any medications currently, who presents with fever, chills, headache, left shoulder pain.  Pt is spanish-speaking only. Three children are at the bedside translating. Patient reports that she started having fever, chills, headache today. She does not have runny nose, sore throat, cough, chest pain, abdominal pain, diarrhea, nausea, vomiting, symptoms of UTI. Her headache is located in the top of head and occipital area. She has left side of neck pain upon movement which is related with her shoulder pain secondary to left humeral neck fracture. She does not have posterior neck pain, neck stiffness or photophobia. Patient reports that she had insect bite on Saturday. She has a small red insect bite mark over posterior knee area. No recent traveling to other  country. She traveled to [place] on 09/08/14.  Patient reports that she had left humeral neck fracture in May 2016. She was seen at [facility] and decided to postpone surgery due to a fear of anaesthesia. Currently she is in a sling. Her left shoulder pain has worsened today and she rates it 10/10 in severity. She took oxycodone with only partial relief. She also complains of left lower leg pain that she has had since her fall in May. She has two small skin tears and now with scabs.   In ED, patient was found to have elevated lactate at 2.51, negative troponin, WBC 9.7, temperature 102.0, negative urinalysis, negative chest x-ray. X-ray of her left shoulder showed Displaced fracture of the left humeral neck similar to prior study,no new fracture.  Hospital Course:  Patient is a pleasant 74 year old female who was admitted to the medicine service on 09/27/2014 when she presented with complaints of fevers, chills, malaise along with left shoulder pain. She reported having a mechanical fall back in May 2016 that resulted in a left humeral neck fracture where she was treated at [facility] undergoing close management. She was found to be septic on admission having lactic acid of 2.5 with temperature 102 and started on broad-spectrum IV antimicrobial therapy with vancomycin, Zosyn and doxycycline. Urinalysis was negative. Chest x-ray did not reveal acute cardiopulmonary disease. Blood cultures grew 1 out of 2 gram-negative rods, infectious disease consulted.  1. Sepsis -Present on admission, evidenced by a venous lactate of 2.5 with temperature 102 along with 102 blood cultures growing gram-negative rods -Workup as far has included a chest x-ray urinalysis  which have come back negative. -Doubt source of infection coming from shoulder as she had a closed fracture -Infectious disease consulted -She was treated with IV Zosyn  -She remains hemodynamically stable and afebrile for the past 72  hours -She had repeat blood cultures drawn and 09/30/2014  -Blood cultures drawn on 09/28/2014 grew Escherichia coli that was pan susceptible -Discussed case with infectious disease who recommended discharging her on ciprofloxacin  2. Comminuted proximal left humerus fracture -Patient having a mechanical fall back in May 2016 undergoing close management. 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 Specifically there is no evidence of osteomyelitis to indicate left shoulder as source of her infection. -Patient was seen and evaluated by orthopedic surgery who did not recommend surgical intervention at this time due to presence of positive blood cultures -She will need close outpatient follow-up  3. Hypertension -Blood pressures elevated -She was discharged on lisinopril 20 mg by mouth daily  4. Type 2 diabetes mellitus -Blood sugars are stable, discharged on Lantus 8 units subcutaneous daily    Consultations:  Orthopedic surgery  Infectious disease  Discharge Exam: Filed Vitals:   10/01/14 0929  BP: 155/68  Pulse: 72  Temp: 98.9 F (37.2 C)  Resp: 18    General: Patient seems much improved today she is awake and alert, tolerating by mouth intake Cardiovascular: Regular rate and rhythm normal S1-S2 no murmurs rubs or gallops Respiratory: Clear to auscultation bilaterally no wheezing rhonchi rales Abdomen: Soft nontender nondistended  Discharge Instructions   Discharge Instructions    Call MD for:  difficulty breathing, headache or visual disturbances    Complete by:  As directed      Call MD for:  extreme fatigue    Complete by:  As directed      Call MD for:  hives    Complete by:  As directed      Call MD for:  persistant dizziness or light-headedness    Complete by:  As directed      Call MD for:  persistant nausea  and vomiting    Complete by:  As directed      Call MD for:  redness, tenderness, or signs of infection (pain, swelling, redness, odor or green/yellow discharge around incision site)    Complete by:  As directed      Call MD for:  severe uncontrolled pain    Complete by:  As directed      Call MD for:  temperature >100.4    Complete by:  As directed      Call MD for:    Complete by:  As directed      Diet - low sodium heart healthy    Complete by:  As directed      Increase activity slowly    Complete by:  As directed           Current Discharge Medication List    START taking these medications   Details  ciprofloxacin (CIPRO) 500 MG tablet Take 1 tablet (500 mg total) by mouth 2 (two) times daily. Qty: 20 tablet, Refills: 0    insulin glargine (LANTUS) 100 UNIT/ML injection Inject 0.08 mLs (8 Units total) into the skin at bedtime. Qty: 10 mL, Refills: 11    lisinopril (PRINIVIL,ZESTRIL) 20 MG tablet Take 1 tablet (20 mg total) by mouth daily. Qty: 30 tablet, Refills:  1      CONTINUE these medications which have NOT CHANGED   Details  HYDROcodone-acetaminophen (NORCO/VICODIN) 5-325 MG per tablet Take 1 tablet by mouth every 4 (four) hours as needed. Qty: 24 tablet, Refills: 0       No Known Allergies Follow-up Information    Follow up with MURPHY, TIMOTHY D, MD In 2 weeks.   Specialty:  Orthopedic Surgery   Contact information:   [address] [place] [address] [phone]        The results of significant diagnostics from this hospitalization (including imaging, microbiology, ancillary and laboratory) are listed below for reference.    Significant Diagnostic Studies: Dg Chest 2 View  09/28/2014   CLINICAL DATA:  74 year old female with fever and chills.  EXAM: CHEST  2 VIEW  COMPARISON:  08/04/2014 sign  FINDINGS: The heart size and mediastinal contours are within normal limits. Both lungs are clear. Osteopenia with degenerative changes of  the spine.  IMPRESSION: No active cardiopulmonary disease.   Electronically Signed   By: Elgie Collard M.D.   On: 09/28/2014 00:26   Ct Shoulder Left W Contrast  09/29/2014   CLINICAL DATA:  Left shoulder pain. Chronic proximal left humerus fracture. Fever and chills.  EXAM: CT OF THE LEFT SHOULDER WITH CONTRAST  TECHNIQUE: Multidetector CT imaging was performed following the standard protocol during bolus administration of intravenous contrast.  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 The remnants of the humeral head are rotated and displaced with overriding of the proximal shaft extending anterior to the rotated humeral head. The fracture has not healed.  However, there are no findings to suggest osteomyelitis. There is increased soft tissue density in the glenohumeral joint which is most likely synovial hypertrophy. The muscles of the rotator cuff demonstrate no atrophy. There is only slight arthropathy of the acromioclavicular joint. No adenopathy. Subcortical cyst formation at the inferior aspect of the glenoid is felt to be degenerative in origin.  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 Arthritic changes of the glenoid and AC joint.   Electronically Signed   By: Francene Boyers M.D.   On: 09/29/2014 15:41   Dg Humerus Left  09/28/2014   CLINICAL DATA:  74 year old female with left arm pain  EXAM: LEFT HUMERUS - 2+ VIEW  COMPARISON:  Radiograph dated 08/04/2014  FINDINGS: There is a hint displaced fracture of the left humeral neck similar to the prior study. Ninety fracture identified. The soft tissues are grossly unremarkable.  IMPRESSION: Displaced fracture of the left humeral neck similar to prior study.  No new fracture.   Electronically Signed   By: Elgie Collard M.D.   On: 09/28/2014 00:29    Microbiology: Recent Results (from the past 240 hour(s))  Urine culture     Status: None   Collection Time: 09/27/14 11:43 PM  Result Value Ref Range Status   Specimen Description URINE, RANDOM  Final   Special Requests NONE  Final   Culture   Final    MULTIPLE SPECIES PRESENT, SUGGEST RECOLLECTION IF CLINICALLY INDICATED   Report Status 09/29/2014 FINAL  Final  Blood culture (routine x 2)     Status: None   Collection Time: 09/28/14 12:30 AM  Result Value Ref Range  Status   Specimen Description BLOOD RIGHT FOREARM  Final   Special Requests   Final    BOTTLES DRAWN AEROBIC AND ANAEROBIC 10CC AEROBIC 5CC ANAEROBIC   Culture  Setup Time   Final    GRAM NEGATIVE RODS AEROBIC BOTTLE ONLY CRITICAL RESULT CALLED TO, READ BACK BY AND VERIFIED WITH: [facility] [id number] [facility]    Culture ESCHERICHIA COLI  Final   Report Status 10/01/2014 FINAL  Final   Organism ID, Bacteria ESCHERICHIA COLI  Final      Susceptibility   Escherichia coli - MIC*    AMPICILLIN <=2 SENSITIVE Sensitive     CEFAZOLIN <=4 SENSITIVE Sensitive     CEFEPIME <=1 SENSITIVE Sensitive     CEFTAZIDIME <=1 SENSITIVE Sensitive     CEFTRIAXONE <=1 SENSITIVE Sensitive     CIPROFLOXACIN <=0.25 SENSITIVE Sensitive     GENTAMICIN <=1 SENSITIVE Sensitive     IMIPENEM <=0.25 SENSITIVE Sensitive     TRIMETH/SULFA <=20 SENSITIVE Sensitive     AMPICILLIN/SULBACTAM <=2 SENSITIVE Sensitive     PIP/TAZO <=4 SENSITIVE Sensitive     * ESCHERICHIA COLI  Blood culture (routine x 2)     Status: None (Preliminary result)   Collection Time: 09/28/14 12:36 AM  Result Value Ref Range Status   Specimen Description BLOOD LEFT HAND  Final   Special Requests BOTTLES DRAWN AEROBIC AND ANAEROBIC 5CC EA  Final   Culture NO GROWTH 2 DAYS  Final   Report Status PENDING  Incomplete  Culture, blood (routine x 2)     Status: None (Preliminary  result)   Collection Time: 09/30/14  3:50 PM  Result Value Ref Range Status   Specimen Description BLOOD RIGHT HAND  Final   Special Requests IN PEDIATRIC BOTTLE  [facility]  Final   Culture PENDING  Incomplete   Report Status PENDING  Incomplete     Labs: Basic Metabolic Panel:  Recent Labs Lab 09/27/14 2321 09/28/14 0312 09/29/14 0535 09/30/14 0406 10/01/14 0500  NA 132* 135 134* 137 140  K 3.8 3.6 3.4* 3.3* 3.4*  CL 95* 101 100* 103 105  CO2 28 24 24 24 25   GLUCOSE 214* 222* 136* 106* 105*  BUN 15 11 8 9 10   CREATININE 0.75 0.72 0.70 0.76 0.69  CALCIUM 9.0 8.4* 8.3* 8.2* 8.4*   Liver Function Tests:  Recent Labs Lab 09/27/14 2321 09/28/14 0312  AST 44* 70*  ALT 30 36  ALKPHOS 101 96  BILITOT 0.7 1.1  PROT 7.2 5.7*  ALBUMIN 3.2* 2.7*   No results for input(s): LIPASE, AMYLASE in the last 168 hours. No results for input(s): AMMONIA in the last 168 hours. CBC:  Recent Labs Lab 09/27/14 2321 09/28/14 0312 09/29/14 0535 09/30/14 0406 10/01/14 0500  WBC 9.7 9.5 10.5 8.9 7.7  NEUTROABS 8.2*  --   --   --   --   HGB 14.1 13.2 13.2 13.2 12.7  HCT 43.8 40.8 41.1 40.6 39.7  MCV 80.7 79.8 79.3 79.0 79.1  PLT 195 168 192 224 239   Cardiac Enzymes: No results for input(s): CKTOTAL, CKMB, CKMBINDEX, TROPONINI in the last 168 hours. BNP: BNP (last 3 results) No results for input(s): BNP in the last 8760 hours.  ProBNP (last 3 results) No results for input(s): PROBNP in the last 8760 hours.  CBG:  Recent Labs Lab 09/30/14 1119 09/30/14 1620 09/30/14 2039 10/01/14 0745 10/01/14 1129  GLUCAP 166* 124* 132* 120* 140*       Signed:  Keairra Bardon  [facility] 10/01/2014, 12:53 PM

## 2014-10-01 NOTE — Progress Notes (Signed)
[facility] PROGRESS NOTE  Meghan Ford ZOX:[id number] DOB: 1940/06/13 DOA: 09/27/2014 PCP: No PCP Per Patient  Assessment/Plan: 1. Sepsis -Present on admission, evidenced by a venous lactate of 2.5 with temperature 102 along with 102 blood cultures growing gram-negative rods -Workup as far has included a chest x-ray urinalysis which have come back negative. -Doubt source of infection coming from shoulder as she had a closed fracture -Infectious disease consulted -She remains on IV Zosyn as vancomycin and doxycycline were discontinued -She remains hemodynamically stable and afebrile for the past 72 hours -She had repeat blood cultures drawn and 09/30/2014  -Blood cultures drawn on 09/28/2014 grew Escherichia coli that was pan susceptible -I think can be transitioned to by mouth antimicrobial therapy, will follow-up on ID's recommendations. She seems to be doing well this morning is tolerating by mouth intake.   2. Comminuted proximal left humerus fracture -Patient having a mechanical fall back in May 2016 undergoing close management. 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 Specifically there is no evidence of osteomyelitis to indicate left shoulder as source of her infection. -Patient was seen and evaluated by orthopedic surgery who did not recommend surgical intervention at this time due to presence of positive blood cultures -She will need close outpatient follow-up  3.  Hypertension -Blood pressures elevated -Will increase her lisinopril dose to 20 mg by mouth daily given elevated blood pressures in the past 24 hours  4.  Type 2 diabetes mellitus -Blood sugars are stable, continue Lantus 8 units subcutaneous daily  Code Status: Full code Family Communication: I spoke with her grandson was present at  bedside Disposition Plan: Anticipate discharge in the next 24 hours, will follow ID's recommendations   Consultants:  Infectious disease  Orthopedic surgery    Antibiotics:  Zosyn  HPI/Subjective: Patient is a pleasant 74 year old female who was admitted to the medicine service on 09/27/2014 when she presented with complaints of fevers, chills, malaise along with left shoulder pain. She reported having a mechanical fall back in May 2016 that resulted in a left humeral neck fracture where she was treated at [facility] undergoing close management. She was found to be septic on admission having lactic acid of 2.5 with temperature 102 and started on broad-spectrum IV antimicrobial therapy with vancomycin, Zosyn and doxycycline. Urinalysis was negative. Chest x-ray did not reveal acute cardiopulmonary disease. Blood cultures grew 1 out of 2 gram-negative rods, infectious disease consulted.  Objective: Filed Vitals:   10/01/14 0500  BP: 167/72  Pulse: 69  Temp: 98.3 F (36.8 C)  Resp: 18    Intake/Output Summary (Last 24 hours) at 10/01/14 0836 Last data filed at 10/01/14 0500  Gross per 24 hour  Intake    940 ml  Output   1350 ml  Net   -410 ml   Filed Weights   09/28/14 0203 09/29/14 2029 09/30/14 2039  Weight: 90.674 kg (199 lb 14.4 oz) 95.391 kg (210 lb 4.8 oz) 95.5 kg (210 lb 8.6 oz)    Exam:   General:  She is in no acute distress awake and alert oriented 3  Cardiovascular: Regular rate and rhythm normal S1-S2  Respiratory: Normal respiratory effort, lungs are clear to auscultation bilaterally  Abdomen: Soft nontender nondistended  Musculoskeletal: She has pain with passive and active movement to her left upper extremity, there is no evidence of infection involving left  upper extremity. No extremity edema  Data Reviewed: Basic Metabolic Panel:  Recent Labs Lab 09/27/14 2321 09/28/14 0312 09/29/14 0535 09/30/14 0406 10/01/14 0500  NA 132* 135 134*  137 140  K 3.8 3.6 3.4* 3.3* 3.4*  CL 95* 101 100* 103 105  CO2 28 24 24 24 25   GLUCOSE 214* 222* 136* 106* 105*  BUN 15 11 8 9 10   CREATININE 0.75 0.72 0.70 0.76 0.69  CALCIUM 9.0 8.4* 8.3* 8.2* 8.4*   Liver Function Tests:  Recent Labs Lab 09/27/14 2321 09/28/14 0312  AST 44* 70*  ALT 30 36  ALKPHOS 101 96  BILITOT 0.7 1.1  PROT 7.2 5.7*  ALBUMIN 3.2* 2.7*   No results for input(s): LIPASE, AMYLASE in the last 168 hours. No results for input(s): AMMONIA in the last 168 hours. CBC:  Recent Labs Lab 09/27/14 2321 09/28/14 0312 09/29/14 0535 09/30/14 0406 10/01/14 0500  WBC 9.7 9.5 10.5 8.9 7.7  NEUTROABS 8.2*  --   --   --   --   HGB 14.1 13.2 13.2 13.2 12.7  HCT 43.8 40.8 41.1 40.6 39.7  MCV 80.7 79.8 79.3 79.0 79.1  PLT 195 168 192 224 239   Cardiac Enzymes: No results for input(s): CKTOTAL, CKMB, CKMBINDEX, TROPONINI in the last 168 hours. BNP (last 3 results) No results for input(s): BNP in the last 8760 hours.  ProBNP (last 3 results) No results for input(s): PROBNP in the last 8760 hours.  CBG:  Recent Labs Lab 09/30/14 0732 09/30/14 1119 09/30/14 1620 09/30/14 2039 10/01/14 0745  GLUCAP 99 166* 124* 132* 120*    Recent Results (from the past 240 hour(s))  Urine culture     Status: None   Collection Time: 09/27/14 11:43 PM  Result Value Ref Range Status   Specimen Description URINE, RANDOM  Final   Special Requests NONE  Final   Culture   Final    MULTIPLE SPECIES PRESENT, SUGGEST RECOLLECTION IF CLINICALLY INDICATED   Report Status 09/29/2014 FINAL  Final  Blood culture (routine x 2)     Status: None   Collection Time: 09/28/14 12:30 AM  Result Value Ref Range Status   Specimen Description BLOOD RIGHT FOREARM  Final   Special Requests   Final    BOTTLES DRAWN AEROBIC AND ANAEROBIC 10CC AEROBIC 5CC ANAEROBIC   Culture  Setup Time   Final    GRAM NEGATIVE RODS AEROBIC BOTTLE ONLY CRITICAL RESULT CALLED TO, READ BACK BY AND VERIFIED  WITH: Bryna Colander [id number] [facility]    Culture ESCHERICHIA COLI  Final   Report Status 10/01/2014 FINAL  Final   Organism ID, Bacteria ESCHERICHIA COLI  Final      Susceptibility   Escherichia coli - MIC*    AMPICILLIN <=2 SENSITIVE Sensitive     CEFAZOLIN <=4 SENSITIVE Sensitive     CEFEPIME <=1 SENSITIVE Sensitive     CEFTAZIDIME <=1 SENSITIVE Sensitive     CEFTRIAXONE <=1 SENSITIVE Sensitive     CIPROFLOXACIN <=0.25 SENSITIVE Sensitive     GENTAMICIN <=1 SENSITIVE Sensitive     IMIPENEM <=0.25 SENSITIVE Sensitive     TRIMETH/SULFA <=20 SENSITIVE Sensitive     AMPICILLIN/SULBACTAM <=2 SENSITIVE Sensitive     PIP/TAZO <=4 SENSITIVE Sensitive     * ESCHERICHIA COLI  Blood culture (routine x 2)     Status: None (Preliminary result)   Collection Time: 09/28/14 12:36 AM  Result Value Ref Range Status   Specimen Description BLOOD LEFT HAND  Final   Special Requests BOTTLES DRAWN AEROBIC AND ANAEROBIC 5CC EA  Final   Culture NO GROWTH 2 DAYS  Final   Report Status PENDING  Incomplete  Culture, blood (routine x 2)     Status: None (Preliminary result)   Collection Time: 09/30/14  3:50 PM  Result Value Ref Range Status   Specimen Description BLOOD RIGHT HAND  Final   Special Requests IN PEDIATRIC BOTTLE  [facility]  Final   Culture PENDING  Incomplete   Report Status PENDING  Incomplete     Studies: Ct Shoulder Left W Contrast  09/29/2014   CLINICAL DATA:  Left shoulder pain. Chronic proximal left humerus fracture. Fever and chills.  EXAM: CT OF THE LEFT SHOULDER WITH CONTRAST  TECHNIQUE: Multidetector CT imaging was performed following the standard protocol during bolus administration of intravenous contrast.  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 The remnants of the humeral head are rotated and displaced  with overriding of the proximal shaft extending anterior to the rotated humeral head. The fracture has not healed.  However, there are no findings to suggest osteomyelitis. There is increased soft tissue density in the glenohumeral joint which is most likely synovial hypertrophy. The muscles of the rotator cuff demonstrate no atrophy. There is only slight arthropathy of the acromioclavicular joint. No adenopathy. Subcortical cyst formation at the inferior aspect of the glenoid is felt to be degenerative in origin.  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 Arthritic changes of the glenoid and AC joint.   Electronically Signed   By: Francene Boyers M.D.   On: 09/29/2014 15:41    Scheduled Meds: . docusate sodium  100 mg Oral BID  . feeding supplement (GLUCERNA SHAKE)  237 mL Oral BID BM  . heparin  5,000 Units Subcutaneous 3 times per day  . HYDROcodone-acetaminophen  2 tablet Oral Once  . insulin aspart  0-9 Units Subcutaneous TID WC  . insulin glargine  8 Units Subcutaneous QHS  . lisinopril  10 mg Oral Daily  . piperacillin-tazobactam (ZOSYN)  IV  3.375 g Intravenous 3 times per day  . sodium chloride  3 mL Intravenous Q12H   Continuous Infusions:    Principal Problem:   Sepsis Active Problems:   Fever   Fx humeral neck   Diabetes mellitus without complication   Headache   Pyrexia   Protein-calorie malnutrition, severe    Time spent: 25 min    Jeralyn Bennett  [facility] Pager [phone]. If 7PM-7AM, please contact night-coverage at [email or web address], password [facility] 10/01/2014, 8:36 AM  LOS: 3 days

## 2014-10-03 LAB — CULTURE, BLOOD (ROUTINE X 2): Culture: NO GROWTH

## 2014-10-05 LAB — CULTURE, BLOOD (ROUTINE X 2)
CULTURE: NO GROWTH
Culture: NO GROWTH

## 2014-10-08 ENCOUNTER — Ambulatory Visit: Payer: Medicaid Other | Attending: Family Medicine | Admitting: Family Medicine

## 2014-10-08 ENCOUNTER — Encounter: Payer: Self-pay | Admitting: Family Medicine

## 2014-10-08 VITALS — BP 192/77 | HR 60 | Temp 97.5°F | Resp 18 | Ht 64.0 in | Wt 207.2 lb

## 2014-10-08 DIAGNOSIS — Z79899 Other long term (current) drug therapy: Secondary | ICD-10-CM | POA: Insufficient documentation

## 2014-10-08 DIAGNOSIS — Z8619 Personal history of other infectious and parasitic diseases: Secondary | ICD-10-CM | POA: Insufficient documentation

## 2014-10-08 DIAGNOSIS — W19XXXA Unspecified fall, initial encounter: Secondary | ICD-10-CM | POA: Insufficient documentation

## 2014-10-08 DIAGNOSIS — E1140 Type 2 diabetes mellitus with diabetic neuropathy, unspecified: Secondary | ICD-10-CM | POA: Insufficient documentation

## 2014-10-08 DIAGNOSIS — E1165 Type 2 diabetes mellitus with hyperglycemia: Secondary | ICD-10-CM | POA: Insufficient documentation

## 2014-10-08 DIAGNOSIS — S42302A Unspecified fracture of shaft of humerus, left arm, initial encounter for closed fracture: Secondary | ICD-10-CM | POA: Diagnosis not present

## 2014-10-08 DIAGNOSIS — E876 Hypokalemia: Secondary | ICD-10-CM

## 2014-10-08 DIAGNOSIS — S42212A Unspecified displaced fracture of surgical neck of left humerus, initial encounter for closed fracture: Secondary | ICD-10-CM

## 2014-10-08 DIAGNOSIS — I10 Essential (primary) hypertension: Secondary | ICD-10-CM | POA: Diagnosis not present

## 2014-10-08 DIAGNOSIS — A4151 Sepsis due to Escherichia coli [E. coli]: Secondary | ICD-10-CM

## 2014-10-08 LAB — CBC WITH DIFFERENTIAL/PLATELET
BASOS PCT: 0 % (ref 0–1)
Basophils Absolute: 0 10*3/uL (ref 0.0–0.1)
Eosinophils Absolute: 0.2 10*3/uL (ref 0.0–0.7)
Eosinophils Relative: 2 % (ref 0–5)
HCT: 44.8 % (ref 36.0–46.0)
HEMOGLOBIN: 14 g/dL (ref 12.0–15.0)
Lymphocytes Relative: 31 % (ref 12–46)
Lymphs Abs: 2.6 10*3/uL (ref 0.7–4.0)
MCH: 25.2 pg — ABNORMAL LOW (ref 26.0–34.0)
MCHC: 31.3 g/dL (ref 30.0–36.0)
MCV: 80.6 fL (ref 78.0–100.0)
MPV: 10.1 fL (ref 8.6–12.4)
Monocytes Absolute: 0.5 10*3/uL (ref 0.1–1.0)
Monocytes Relative: 6 % (ref 3–12)
NEUTROS ABS: 5.1 10*3/uL (ref 1.7–7.7)
NEUTROS PCT: 61 % (ref 43–77)
PLATELETS: 453 10*3/uL — AB (ref 150–400)
RBC: 5.56 MIL/uL — ABNORMAL HIGH (ref 3.87–5.11)
RDW: 14.6 % (ref 11.5–15.5)
WBC: 8.3 10*3/uL (ref 4.0–10.5)

## 2014-10-08 LAB — GLUCOSE, POCT (MANUAL RESULT ENTRY): POC Glucose: 130 mg/dl — AB (ref 70–99)

## 2014-10-08 MED ORDER — TRAMADOL HCL 50 MG PO TABS: 50.0000 mg | ORAL_TABLET | Freq: Three times a day (TID) | ORAL | Status: DC | PRN

## 2014-10-08 MED ORDER — GABAPENTIN 300 MG PO CAPS
300.0000 mg | ORAL_CAPSULE | Freq: Two times a day (BID) | ORAL | Status: DC
Start: 2014-10-08 — End: 2014-10-22

## 2014-10-08 MED ORDER — LISINOPRIL 20 MG PO TABS: 40.0000 mg | ORAL_TABLET | Freq: Every day | ORAL | Status: DC

## 2014-10-08 NOTE — Progress Notes (Addendum)
Subjective:    Patient ID: Meghan Ford, female    DOB: [birth year], 74 y.o.   MRN: [id number]  Admit date: 09/27/14 Discharge date: 10/01/14  HPI  74 year old female with a history of type 2 diabetes mellitus, hypertension, proximal left humerus fracture secondary to a fall in 07/2014 and was managed at [facility] but refused surgery at the time due to fear of anesthesia; she presented to the ED at [facility] with fever or chills, headache.  In ED, patient was found to have elevated lactate at 2.51, negative troponin, WBC 9.7, temperature 102.0, negative urinalysis, negative chest x-ray. The shoulder x-ray revealed displaced fracture of the left humeral neck similar to prior study, no new fracture. She was admitted for sepsis and started on broad-spectrum antibiotics with IV vancomycin and IV Zosyn and doxycycline. Blood culture grew Escherichia coli that was pansensitive; she was seen by infectious disease and repeat blood cultures subsequently revealed no growth to date. 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  Her condition improved and she was placed on lisinopril 20 mg hypertension and on Lantus 8 units daily for diabetes mellitus with recommendations to follow-up outpatient orthopedics.  Interval history: Complains of pain in her left shoulder and has run out of her narcotic prescription which she received from the hospital. She also was informed to call the orthopedic surgeon-Dr. Eulah Pont for appointment to discuss elective repair for left shoulder. Seen with the aid of Spanish interpreter- Belen watkins and with her daughter. She does not have a PCP- previously followed at [facility] and was last seen in 01/2014  Past Medical History  Diagnosis Date  . Diabetes mellitus without  complication   . Fx humeral neck    Past Surgical History  Procedure Laterality Date  . Vein surgery      left leg    History   Social History  . Marital Status: Single    Spouse Name: N/A  . Number of Children: N/A  . Years of Education: N/A   Occupational History  . Not on file.   Social History Main Topics  . Smoking status: Never Smoker   . Smokeless tobacco: Not on file  . Alcohol Use: No  . Drug Use: No  . Sexual Activity: Not on file   Other Topics Concern  . Not on file   Social History Narrative    No Known Allergies   Review of Systems  Constitutional: Negative for activity change, appetite change and fatigue.  HENT: Negative for congestion, sinus pressure and sore throat.   Eyes: Negative for visual disturbance.  Respiratory: Negative for cough, chest tightness, shortness of breath and wheezing.   Cardiovascular: Negative for chest pain and palpitations.  Gastrointestinal: Negative for abdominal pain, constipation and abdominal distention.  Endocrine: Negative for polydipsia.  Genitourinary: Negative for dysuria and frequency.  Musculoskeletal:       See history of present illness  Skin: Negative for rash.  Neurological: Positive for numbness. Negative for tremors and light-headedness.  Hematological: Does not bruise/bleed easily.  Psychiatric/Behavioral: Negative for behavioral problems and agitation.          Objective: Filed Vitals:   10/08/14 1025  BP: 192/77  Pulse: 60  Temp: 97.5 F (36.4 C)  TempSrc: Oral  Resp: 18  Height: 5\' 4"  (1.626 m)  Weight: 207 lb 3.2  oz (93.985 kg)  SpO2: 98%       Physical Exam  Constitutional: She is oriented to person, place, and time. She appears well-developed and well-nourished. No distress.  HENT:  Head: Normocephalic.  Right Ear: External ear normal.  Left Ear: External ear normal.  Nose: Nose normal.  Mouth/Throat: Oropharynx is clear and moist.  Eyes: Conjunctivae and EOM are normal.  Pupils are equal, round, and reactive to light.  Neck: Normal range of motion. No JVD present.  Cardiovascular: Normal rate, regular rhythm, normal heart sounds and intact distal pulses.  Exam reveals no gallop.   No murmur heard. Pulmonary/Chest: Effort normal and breath sounds normal. No respiratory distress. She has no wheezes. She has no rales. She exhibits no tenderness.  Abdominal: Soft. Bowel sounds are normal. She exhibits no distension and no mass. There is no tenderness.  Musculoskeletal:  Left arm in a sling. Normal sensation in fingers and normal range of motion of the fingers; normal radial pulse. Right arm normal.  Neurological: She is alert and oriented to person, place, and time. She has normal reflexes.  Skin: Skin is warm and dry. She is not diaphoretic.  Psychiatric: She has a normal mood and affect.          Assessment & Plan:  74 year old female with a history of uncontrolled diabetes mellitus, hypertension, humeral fracture of left arm recently hospitalized for sepsis secondary to Escherichia coli.  Type 2 diabetes mellitus with neuropathy: Uncontrolled with A1c of 10.4 CBG of 130 in the clinic today reveals some improvement and so I will make no changes to regimen. Commenced on gabapentin for neuropathy-side effects discussed.  Hypertension: Uncontrolled Increased dose of lisinopril from 20 mg to 40 mg. Reassess blood pressure next visit. Low-sodium, DASH diet.  Hypokalemia: Last potassium was 3.4; hopefully increase in dose of lisinopril will bring about improvement.  Left humeral fracture: Patient to call Dr. Eulah Pont for follow-up appointment. Placed on tramadol.  Sepsis: Most recent blood culture reveals no growth to date. We'll send a CBC.  This note has been created with Education officer, environmental. Any transcriptional errors are unintentional.

## 2014-10-08 NOTE — Progress Notes (Signed)
Patient here for follow up.  Patient has been receiiving antibiotics for sepsis and needs blood work to figure out if it is working.  Patient is scheduled to see an orthopedist to see if she needs surgery if the infection is under control.   Patient injured left arm 08/05/14.  Patient has no complaints of pain at this time, but reports that she does experience pain in the left arm sometimes.  Patient states she is out of Hydrocodone, and would like a refill on pain medication. CBG not fasting: 130

## 2014-10-09 DIAGNOSIS — I10 Essential (primary) hypertension: Secondary | ICD-10-CM | POA: Insufficient documentation

## 2014-10-09 DIAGNOSIS — E876 Hypokalemia: Secondary | ICD-10-CM | POA: Insufficient documentation

## 2014-10-09 NOTE — Patient Instructions (Signed)
Hipertensión °(Hypertension) °La hipertensión, conocida comúnmente como presión arterial alta, se produce cuando la sangre bombea en las arterias con mucha fuerza. Las arterias son los vasos sanguíneos que transportan la sangre desde el corazón hacia todas las partes del cuerpo. Una lectura de la presión arterial consiste en un número más alto sobre un número más bajo, por ejemplo, 110/72. El número más alto (presión sistólica) corresponde a la presión interna de las arterias cuando el corazón bombea sangre. El número más bajo (presión diastólica) corresponde a la presión interna de las arterias cuando el corazón se relaja. En condiciones ideales, la presión arterial debe ser inferior a 120/80. °La hipertensión fuerza al corazón a trabajar más para bombear la sangre. Las arterias pueden estrecharse o ponerse rígidas. La hipertensión conlleva el riesgo de enfermedad cardíaca, ictus y otros problemas.  °FACTORES DE RIESGO °Algunos factores de riesgo de hipertensión son controlables, pero otros no lo son.  °Entre los factores de riesgo que usted no puede controlar, se incluyen:  °· La raza. El riesgo es mayor para las personas afroamericanas. °· La edad. Los riesgos aumentan con la edad. °· El sexo. Antes de los 45 años, los hombres corren más riesgo que las mujeres. Después de los 65 años, las mujeres corren más riesgo que los hombres. °Entre los factores de riesgo que usted puede controlar, se incluyen: °· No hacer la cantidad suficiente de actividad física o ejercicio. °· Tener sobrepeso. °· Consumir mucha grasa, azúcar, calorías o sal en la dieta. °· Beber alcohol en exceso. °SIGNOS Y SÍNTOMAS °Por lo general, la hipertensión no causa signos o síntomas. La hipertensión demasiado alta (crisis hipertensiva) puede causar dolor de cabeza, ansiedad, falta de aire y hemorragia nasal. °DIAGNÓSTICO  °Para detectar si usted tiene hipertensión, el médico le medirá la presión arterial mientras esté sentado, con el brazo  levantado a la altura del corazón. Debe medirla al menos dos veces en el mismo brazo. Determinadas condiciones pueden causar una diferencia de presión arterial entre el brazo izquierdo y el derecho. El hecho de tener una sola lectura de la presión arterial más alta que lo normal no significa que necesita un tratamiento. En el caso de tener una lectura de la presión arterial con un valor alto, pídale al médico que la verifique nuevamente. °TRATAMIENTO  °El tratamiento de la hipertensión arterial incluye hacer cambios en el estilo de vida y, posiblemente, tomar medicamentos. Un estilo de vida saludable puede ayudar a bajar la presión arterial alta. Quizá deba cambiar algunos hábitos. °Los cambios en el estilo de vida pueden incluir: °· Seguir la dieta DASH. Esta dieta tiene un alto contenido de frutas, verduras y cereales integrales. Incluye poca cantidad de sal, carnes rojas y azúcares agregados. °· Hacer al menos 2 ½ horas de actividad física enérgica todas las semanas. °· Perder peso, si es necesario. °· No fumar. °· Limitar el consumo de bebidas alcohólicas. °· Aprender formas de reducir el estrés. °Si los cambios en el estilo de vida no son suficientes para lograr controlar la presión arterial, el médico puede recetarle medicamentos. Quizá necesite tomar más de uno. Trabaje en conjunto con su médico para comprender los riesgos y los beneficios. °INSTRUCCIONES PARA EL CUIDADO EN EL HOGAR °· Haga que le midan de nuevo la presión arterial según las indicaciones del médico. °· Tome los medicamentos solamente como se lo haya indicado el médico. Siga cuidadosamente las indicaciones. Los medicamentos para la presión arterial deben tomarse según las indicaciones. Los medicamentos pierden eficacia al omitir las dosis. El hecho de omitir   las dosis también aumenta el riesgo de otros problemas. °· No fume. °· Contrólese la presión arterial en su casa según las indicaciones del médico. °SOLICITE ATENCIÓN MÉDICA SI:  °· Piensa  que tiene una reacción alérgica a los medicamentos. °· Tiene mareos o dolores de cabeza con recurrencia. °· Tiene hinchazón en los tobillos. °· Tiene problemas de visión. °SOLICITE ATENCIÓN MÉDICA DE INMEDIATO SI: °· Siente un dolor de cabeza intenso o confusión. °· Siente debilidad inusual, adormecimiento o que se desmayará. °· Siente dolor intenso en el pecho o en el abdomen. °· Vomita repetidas veces. °· Tiene dificultad para respirar. °ASEGÚRESE DE QUE:  °· Comprende estas instrucciones. °· Controlará su afección. °· Recibirá ayuda de inmediato si no mejora o si empeora. °Document Released: 02/27/2005 Document Revised: 07/14/2013 °ExitCare® Patient Information ©2015 [employer]. This information is not intended to replace advice given to you by your health care provider. Make sure you discuss any questions you have with your health care provider. ° °

## 2014-10-12 ENCOUNTER — Telehealth: Payer: Self-pay

## 2014-10-12 NOTE — Telephone Encounter (Signed)
Nurse called patient, reached voicemail at both numbers provided. Left message for patient to call Herbert Seta with [facility], at [phone], on both voice mails.

## 2014-10-12 NOTE — Telephone Encounter (Signed)
-----   Message from Jaclyn Shaggy, MD sent at 10/09/2014  8:13 AM EDT ----- Please inform the patient that labs are stable. Thank you.

## 2014-10-22 ENCOUNTER — Encounter: Payer: Self-pay | Admitting: Family Medicine

## 2014-10-22 ENCOUNTER — Ambulatory Visit: Payer: Self-pay | Attending: Family Medicine | Admitting: Family Medicine

## 2014-10-22 VITALS — BP 187/86 | HR 62 | Temp 98.6°F | Ht 64.0 in | Wt 203.0 lb

## 2014-10-22 DIAGNOSIS — E1140 Type 2 diabetes mellitus with diabetic neuropathy, unspecified: Secondary | ICD-10-CM | POA: Insufficient documentation

## 2014-10-22 DIAGNOSIS — X58XXXD Exposure to other specified factors, subsequent encounter: Secondary | ICD-10-CM | POA: Insufficient documentation

## 2014-10-22 DIAGNOSIS — S42302D Unspecified fracture of shaft of humerus, left arm, subsequent encounter for fracture with routine healing: Secondary | ICD-10-CM | POA: Insufficient documentation

## 2014-10-22 DIAGNOSIS — E1165 Type 2 diabetes mellitus with hyperglycemia: Secondary | ICD-10-CM | POA: Insufficient documentation

## 2014-10-22 DIAGNOSIS — I10 Essential (primary) hypertension: Secondary | ICD-10-CM | POA: Insufficient documentation

## 2014-10-22 DIAGNOSIS — Z79899 Other long term (current) drug therapy: Secondary | ICD-10-CM | POA: Insufficient documentation

## 2014-10-22 DIAGNOSIS — S42212A Unspecified displaced fracture of surgical neck of left humerus, initial encounter for closed fracture: Secondary | ICD-10-CM

## 2014-10-22 DIAGNOSIS — E876 Hypokalemia: Secondary | ICD-10-CM | POA: Insufficient documentation

## 2014-10-22 LAB — COMPREHENSIVE METABOLIC PANEL
ALBUMIN: 3.9 g/dL (ref 3.6–5.1)
ALT: 11 U/L (ref 6–29)
AST: 15 U/L (ref 10–35)
Alkaline Phosphatase: 75 U/L (ref 33–130)
BUN: 16 mg/dL (ref 7–25)
CALCIUM: 9.2 mg/dL (ref 8.6–10.4)
CHLORIDE: 104 mmol/L (ref 98–110)
CO2: 30 mmol/L (ref 20–31)
Creat: 0.77 mg/dL (ref 0.60–0.93)
Glucose, Bld: 141 mg/dL — ABNORMAL HIGH (ref 65–99)
POTASSIUM: 4.8 mmol/L (ref 3.5–5.3)
SODIUM: 143 mmol/L (ref 135–146)
Total Bilirubin: 0.7 mg/dL (ref 0.2–1.2)
Total Protein: 6.8 g/dL (ref 6.1–8.1)

## 2014-10-22 LAB — LIPID PANEL
Cholesterol: 192 mg/dL (ref 125–200)
HDL: 51 mg/dL (ref >=46)
LDL CALC: 113 mg/dL (ref <130)
Total CHOL/HDL Ratio: 3.8 Ratio (ref <=5.0)
Triglycerides: 138 mg/dL (ref <150)
VLDL: 28 mg/dL (ref <30)

## 2014-10-22 LAB — GLUCOSE, POCT (MANUAL RESULT ENTRY): POC GLUCOSE: 138 mg/dL — AB (ref 70–99)

## 2014-10-22 MED ORDER — INSULIN GLARGINE 100 UNIT/ML ~~LOC~~ SOLN: 8.0000 [IU] | Freq: Every day | SUBCUTANEOUS | Status: DC

## 2014-10-22 MED ORDER — LISINOPRIL 40 MG PO TABS: 40.0000 mg | ORAL_TABLET | Freq: Every day | ORAL | Status: DC

## 2014-10-22 MED ORDER — GABAPENTIN 300 MG PO CAPS: 300.0000 mg | ORAL_CAPSULE | Freq: Two times a day (BID) | ORAL | Status: DC

## 2014-10-22 NOTE — Patient Instructions (Signed)
Hipertensión °(Hypertension) °La hipertensión, conocida comúnmente como presión arterial alta, se produce cuando la sangre bombea en las arterias con mucha fuerza. Las arterias son los vasos sanguíneos que transportan la sangre desde el corazón hacia todas las partes del cuerpo. Una lectura de la presión arterial consiste en un número más alto sobre un número más bajo, por ejemplo, 110/72. El número más alto (presión sistólica) corresponde a la presión interna de las arterias cuando el corazón bombea sangre. El número más bajo (presión diastólica) corresponde a la presión interna de las arterias cuando el corazón se relaja. En condiciones ideales, la presión arterial debe ser inferior a 120/80. °La hipertensión fuerza al corazón a trabajar más para bombear la sangre. Las arterias pueden estrecharse o ponerse rígidas. La hipertensión conlleva el riesgo de enfermedad cardíaca, ictus y otros problemas.  °FACTORES DE RIESGO °Algunos factores de riesgo de hipertensión son controlables, pero otros no lo son.  °Entre los factores de riesgo que usted no puede controlar, se incluyen:  °· La raza. El riesgo es mayor para las personas afroamericanas. °· La edad. Los riesgos aumentan con la edad. °· El sexo. Antes de los 45 años, los hombres corren más riesgo que las mujeres. Después de los 65 años, las mujeres corren más riesgo que los hombres. °Entre los factores de riesgo que usted puede controlar, se incluyen: °· No hacer la cantidad suficiente de actividad física o ejercicio. °· Tener sobrepeso. °· Consumir mucha grasa, azúcar, calorías o sal en la dieta. °· Beber alcohol en exceso. °SIGNOS Y SÍNTOMAS °Por lo general, la hipertensión no causa signos o síntomas. La hipertensión demasiado alta (crisis hipertensiva) puede causar dolor de cabeza, ansiedad, falta de aire y hemorragia nasal. °DIAGNÓSTICO  °Para detectar si usted tiene hipertensión, el médico le medirá la presión arterial mientras esté sentado, con el brazo  levantado a la altura del corazón. Debe medirla al menos dos veces en el mismo brazo. Determinadas condiciones pueden causar una diferencia de presión arterial entre el brazo izquierdo y el derecho. El hecho de tener una sola lectura de la presión arterial más alta que lo normal no significa que necesita un tratamiento. En el caso de tener una lectura de la presión arterial con un valor alto, pídale al médico que la verifique nuevamente. °TRATAMIENTO  °El tratamiento de la hipertensión arterial incluye hacer cambios en el estilo de vida y, posiblemente, tomar medicamentos. Un estilo de vida saludable puede ayudar a bajar la presión arterial alta. Quizá deba cambiar algunos hábitos. °Los cambios en el estilo de vida pueden incluir: °· Seguir la dieta DASH. Esta dieta tiene un alto contenido de frutas, verduras y cereales integrales. Incluye poca cantidad de sal, carnes rojas y azúcares agregados. °· Hacer al menos 2 ½ horas de actividad física enérgica todas las semanas. °· Perder peso, si es necesario. °· No fumar. °· Limitar el consumo de bebidas alcohólicas. °· Aprender formas de reducir el estrés. °Si los cambios en el estilo de vida no son suficientes para lograr controlar la presión arterial, el médico puede recetarle medicamentos. Quizá necesite tomar más de uno. Trabaje en conjunto con su médico para comprender los riesgos y los beneficios. °INSTRUCCIONES PARA EL CUIDADO EN EL HOGAR °· Haga que le midan de nuevo la presión arterial según las indicaciones del médico. °· Tome los medicamentos solamente como se lo haya indicado el médico. Siga cuidadosamente las indicaciones. Los medicamentos para la presión arterial deben tomarse según las indicaciones. Los medicamentos pierden eficacia al omitir las dosis. El hecho de omitir   las dosis también aumenta el riesgo de otros problemas. °· No fume. °· Contrólese la presión arterial en su casa según las indicaciones del médico. °SOLICITE ATENCIÓN MÉDICA SI:  °· Piensa  que tiene una reacción alérgica a los medicamentos. °· Tiene mareos o dolores de cabeza con recurrencia. °· Tiene hinchazón en los tobillos. °· Tiene problemas de visión. °SOLICITE ATENCIÓN MÉDICA DE INMEDIATO SI: °· Siente un dolor de cabeza intenso o confusión. °· Siente debilidad inusual, adormecimiento o que se desmayará. °· Siente dolor intenso en el pecho o en el abdomen. °· Vomita repetidas veces. °· Tiene dificultad para respirar. °ASEGÚRESE DE QUE:  °· Comprende estas instrucciones. °· Controlará su afección. °· Recibirá ayuda de inmediato si no mejora o si empeora. °Document Released: 02/27/2005 Document Revised: 07/14/2013 °ExitCare® Patient Information ©2015 [employer]. This information is not intended to replace advice given to you by your health care provider. Make sure you discuss any questions you have with your health care provider. ° °

## 2014-10-22 NOTE — Telephone Encounter (Signed)
-----   Message from Enobong Amao, MD sent at 10/09/2014  8:13 AM EDT ----- Please inform the patient that labs are stable. Thank you. 

## 2014-10-22 NOTE — Progress Notes (Signed)
Meghan Ford interpreter present F/u for DM2 and HTN Patient pressure is 187/86 this am and she reports not staking medications this am She is in no pain and CBG 138 She states she used to take a pill for her DM2 and feels she needs it again

## 2014-10-22 NOTE — Progress Notes (Signed)
Subjective:    Patient ID: Meghan Ford, female    DOB: [birth year], 74 y.o.   MRN: [id number]  HPI 74 year old female with a history of uncontrolled diabetes mellitus, hypertension, humeral fracture of left arm hospitalized at [facility] for sepsis secondary to Escherichia coli from 09/27/14-10/01/14.  She is seen with the aid of the Spanish interpreter and is accompanied by her son and is here for follow-up of her blood pressure however she is yet to take her morning dose of antihypertensives because she is fasting in anticipation of blood work. She has been compliant with her blood sugar checks and reports a fasting blood sugar was 136 this morning. On further questioning she endorses remaining on lisinopril 20 mg and never increased the dose to 40 mg as instructed at her last office visit. Her right arm is still in a sling and she is scheduled to have surgery on her left arm next week Monday.  No new complaints at this time.  Past Medical History  Diagnosis Date  . Diabetes mellitus without complication   . Fx humeral neck     Past Surgical History  Procedure Laterality Date  . Vein surgery      left leg    Social History   Social History  . Marital Status: Single    Spouse Name: N/A  . Number of Children: N/A  . Years of Education: N/A   Occupational History  . Not on file.   Social History Main Topics  . Smoking status: Never Smoker   . Smokeless tobacco: Not on file  . Alcohol Use: No  . Drug Use: No  . Sexual Activity: Not on file   Other Topics Concern  . Not on file   Social History Narrative    No Known Allergies  Current Outpatient Prescriptions on File Prior to Visit  Medication Sig Dispense Refill  . traMADol (ULTRAM) 50 MG tablet Take 1 tablet (50 mg total) by mouth every 8 (eight) hours as needed. 60 tablet 1  . ciprofloxacin (CIPRO) 500 MG tablet Take 1 tablet (500 mg total) by mouth 2 (two) times daily. (Patient not taking: Reported  on 10/22/2014) 20 tablet 0  . HYDROcodone-acetaminophen (NORCO/VICODIN) 5-325 MG per tablet Take 1 tablet by mouth every 4 (four) hours as needed. (Patient not taking: Reported on 10/22/2014) 24 tablet 0   No current facility-administered medications on file prior to visit.       Review of Systems Constitutional: Negative for activity change, appetite change and fatigue.  HENT: Negative for congestion, sinus pressure and sore throat.   Eyes: Negative for visual disturbance.  Respiratory: Negative for cough, chest tightness, shortness of breath and wheezing.   Cardiovascular: Negative for chest pain and palpitations.  Gastrointestinal: Negative for abdominal pain, constipation and abdominal distention.  Endocrine: Negative for polydipsia.  Genitourinary: Negative for dysuria and frequency.  Musculoskeletal:       See history of present illness  Skin: Negative for rash.  Neurological: Positive for numbness. Negative for tremors and light-headedness.  Hematological: Does not bruise/bleed easily.  Psychiatric/Behavioral: Negative for behavioral problems and agitation.     Objective: Filed Vitals:   10/22/14 0945  BP: 187/86  Pulse: 62  Temp: 98.6 F (37 C)  Height: 5\' 4"  (1.626 m)  Weight: 203 lb (92.08 kg)  SpO2: 93%      Physical Exam  Constitutional: She is oriented to person, place, and time. She appears well-developed and well-nourished. No distress.  Cardiovascular: Normal  rate, regular rhythm, normal heart sounds and intact distal pulses.  Exam reveals no gallop.   No murmur heard. Pulmonary/Chest: Effort normal and breath sounds normal. No respiratory distress. She has no wheezes. She has no rales. She exhibits no tenderness.  Abdominal: Soft. Bowel sounds are normal. She exhibits no distension and no mass. There is no tenderness.  Musculoskeletal:  Left arm in a sling. Normal sensation in fingers and normal range of motion of the fingers; normal radial pulse. Right  arm normal.  Neurological: She is alert and oriented to person, place, and time. She has normal reflexes.  Skin: Skin is warm and dry. She is not diaphoretic.  Psychiatric: She has a normal mood and affect.         Assessment & Plan:  74 year old female with a history of uncontrolled diabetes mellitus, hypertension, humeral fracture of left arm recently hospitalized for sepsis secondary to Escherichia coli.  Type 2 diabetes mellitus with neuropathy: Uncontrolled with A1c of 10.4 CBG of 138 in the clinic today reveals some improvement and so I will make no changes to regimen. Advised to pickup gabapentin for neuropathy-side effects discussed.  Hypertension: Uncontrolled due to the fact that she is yet to take her antihypertensives and is fasting in anticipation of labs She has also remained on the 20 mg of lisinopril and never commenced the increased dose of 40 mg New prescription sent to pharmacy. Reassess blood pressure next visit. Low-sodium, DASH diet.  Hypokalemia: Last potassium was 3.4 Repeat basic metabolic panel today and I will make no changes to her regimen if she is still hypokalemic given she never increased her dose of lisinopril.  Left humeral fracture: Scheduled for surgery next week.    This note has been created with Education officer, environmental. Any transcriptional errors are unintentional.

## 2014-10-22 NOTE — Telephone Encounter (Signed)
Nurse called patient, via in house interpreter, [place].  Patient requested Meghan Ford to speak with son about results. Son is aware of stable labs.  Patient voices understanding and has no further questions at this time.

## 2014-10-23 ENCOUNTER — Encounter (HOSPITAL_COMMUNITY): Payer: Self-pay | Admitting: Emergency Medicine

## 2014-10-23 ENCOUNTER — Other Ambulatory Visit: Payer: Self-pay | Admitting: Family Medicine

## 2014-10-23 DIAGNOSIS — E785 Hyperlipidemia, unspecified: Secondary | ICD-10-CM

## 2014-10-23 LAB — MICROALBUMIN / CREATININE URINE RATIO
Creatinine, Urine: 149.5 mg/dL
MICROALB UR: 24.9 mg/dL — AB (ref <2.0)
Microalb Creat Ratio: 166.6 mg/g — ABNORMAL HIGH (ref 0.0–30.0)

## 2014-10-23 MED ORDER — ATORVASTATIN CALCIUM 40 MG PO TABS: 40.0000 mg | ORAL_TABLET | Freq: Every day | ORAL | Status: DC

## 2014-10-23 NOTE — H&P (Signed)
  PREOPERATIVE H&P  Chief Complaint: LEFT HUMERUS FRACTURE NONUNION  HPI: Meghan Ford is a 74 y.o. female who presents for preoperative history and physical with a diagnosis of LEFT HUMERUS FRACTURE NONUNION. Symptoms are rated as moderate to severe, and have been worsening.  This is significantly impairing activities of daily living.  She has elected for surgical management.   Past Medical History  Diagnosis Date  . Diabetes mellitus without complication   . Fx humeral neck    Past Surgical History  Procedure Laterality Date  . Vein surgery      left leg   Social History   Social History  . Marital Status: Single    Spouse Name: N/A  . Number of Children: N/A  . Years of Education: N/A   Social History Main Topics  . Smoking status: Never Smoker   . Smokeless tobacco: Not on file  . Alcohol Use: No  . Drug Use: No  . Sexual Activity: Not on file   Other Topics Concern  . Not on file   Social History Narrative   Family History  Problem Relation Age of Onset  . Heart disease Sister    No Known Allergies Prior to Admission medications   Medication Sig Start Date End Date Taking? Authorizing Provider  atorvastatin (LIPITOR) 40 MG tablet Take 1 tablet (40 mg total) by mouth daily. 10/23/14   Jaclyn Shaggy, MD  ciprofloxacin (CIPRO) 500 MG tablet Take 1 tablet (500 mg total) by mouth 2 (two) times daily. Patient not taking: Reported on 10/22/2014 10/01/14   Jeralyn Bennett, MD  gabapentin (NEURONTIN) 300 MG capsule Take 1 capsule (300 mg total) by mouth 2 (two) times daily. 10/22/14   Jaclyn Shaggy, MD  HYDROcodone-acetaminophen (NORCO/VICODIN) 5-325 MG per tablet Take 1 tablet by mouth every 4 (four) hours as needed. Patient not taking: Reported on 10/22/2014 08/04/14   Everlene Farrier, PA-C  insulin glargine (LANTUS) 100 UNIT/ML injection Inject 0.08 mLs (8 Units total) into the skin at bedtime. 10/22/14   Jaclyn Shaggy, MD  lisinopril (PRINIVIL,ZESTRIL) 40 MG  tablet Take 1 tablet (40 mg total) by mouth daily. 10/22/14   Jaclyn Shaggy, MD  traMADol (ULTRAM) 50 MG tablet Take 1 tablet (50 mg total) by mouth every 8 (eight) hours as needed. 10/08/14   Jaclyn Shaggy, MD     Positive ROS: All other systems have been reviewed and were otherwise negative with the exception of those mentioned in the HPI and as above.  Physical Exam: General: Alert, no acute distress Cardiovascular: No pedal edema Respiratory: No cyanosis, no use of accessory musculature GI: No organomegaly, abdomen is soft and non-tender Skin: No lesions in the area of chief complaint Neurologic: Sensation intact distally Psychiatric: Patient is competent for consent with normal mood and affect Lymphatic: No axillary or cervical lymphadenopathy  MUSCULOSKELETAL: L shoulder has no erythema or warmth.  Tender to palpation over fracture site.  Pain with all motion of the shoulder.  Decreased strength throughout rotator cuff due to pain.  Sensation intact with 2+ distal pulses.   Assessment: LEFT HUMERUS FRACTURE NONUNION  Plan: Plan for Procedure(s): LEFT TOTAL SHOULDER ARTHROPLASTY  The risks benefits and alternatives were discussed with the patient including but not limited to the risks of nonoperative treatment, versus surgical intervention including infection, bleeding, nerve injury,  blood clots, cardiopulmonary complications, morbidity, mortality, among others, and they were willing to proceed.   Lynann Bologna, PA-C  10/23/2014 9:24 AM

## 2014-10-26 ENCOUNTER — Inpatient Hospital Stay (HOSPITAL_COMMUNITY)
Admission: RE | Admit: 2014-10-26 | Discharge: 2014-10-28 | DRG: 483 | Disposition: A | Discharge status: Home / Self Care | Payer: Self-pay | Source: Ambulatory Visit | Attending: Orthopedic Surgery | Admitting: Orthopedic Surgery

## 2014-10-26 DIAGNOSIS — E669 Obesity, unspecified: Secondary | ICD-10-CM | POA: Diagnosis present

## 2014-10-26 DIAGNOSIS — Z794 Long term (current) use of insulin: Secondary | ICD-10-CM

## 2014-10-26 DIAGNOSIS — D72829 Elevated white blood cell count, unspecified: Secondary | ICD-10-CM | POA: Insufficient documentation

## 2014-10-26 DIAGNOSIS — Z79899 Other long term (current) drug therapy: Secondary | ICD-10-CM

## 2014-10-26 DIAGNOSIS — S42202K Unspecified fracture of upper end of left humerus, subsequent encounter for fracture with nonunion: Principal | ICD-10-CM

## 2014-10-26 DIAGNOSIS — IMO0002 Reserved for concepts with insufficient information to code with codable children: Secondary | ICD-10-CM | POA: Diagnosis present

## 2014-10-26 DIAGNOSIS — I10 Essential (primary) hypertension: Secondary | ICD-10-CM | POA: Diagnosis present

## 2014-10-26 DIAGNOSIS — E785 Hyperlipidemia, unspecified: Secondary | ICD-10-CM | POA: Diagnosis present

## 2014-10-26 DIAGNOSIS — E1140 Type 2 diabetes mellitus with diabetic neuropathy, unspecified: Secondary | ICD-10-CM | POA: Diagnosis present

## 2014-10-26 DIAGNOSIS — Z9889 Other specified postprocedural states: Secondary | ICD-10-CM

## 2014-10-26 DIAGNOSIS — E1165 Type 2 diabetes mellitus with hyperglycemia: Secondary | ICD-10-CM | POA: Diagnosis present

## 2014-10-26 DIAGNOSIS — Z96619 Presence of unspecified artificial shoulder joint: Secondary | ICD-10-CM | POA: Insufficient documentation

## 2014-10-26 DIAGNOSIS — Z96612 Presence of left artificial shoulder joint: Secondary | ICD-10-CM

## 2014-10-26 DIAGNOSIS — S42209A Unspecified fracture of upper end of unspecified humerus, initial encounter for closed fracture: Secondary | ICD-10-CM | POA: Diagnosis present

## 2014-10-26 DIAGNOSIS — E1142 Type 2 diabetes mellitus with diabetic polyneuropathy: Secondary | ICD-10-CM | POA: Diagnosis present

## 2014-10-26 LAB — GLUCOSE, CAPILLARY
GLUCOSE-CAPILLARY: 190 mg/dL — AB (ref 65–99)
Glucose-Capillary: 204 mg/dL — ABNORMAL HIGH (ref 65–99)
Glucose-Capillary: 183 mg/dL — ABNORMAL HIGH (ref 65–99)

## 2014-10-26 LAB — CBC
HEMATOCRIT: 47.3 % — AB (ref 36.0–46.0)
HEMOGLOBIN: 14.9 g/dL (ref 12.0–15.0)
MCH: 25.6 pg — AB (ref 26.0–34.0)
MCHC: 31.5 g/dL (ref 30.0–36.0)
MCV: 81.3 fL (ref 78.0–100.0)
Platelets: 226 10*3/uL (ref 150–400)
RBC: 5.82 MIL/uL — ABNORMAL HIGH (ref 3.87–5.11)
RDW: 14.8 % (ref 11.5–15.5)
WBC: 9.5 10*3/uL (ref 4.0–10.5)

## 2014-10-26 LAB — BASIC METABOLIC PANEL
Anion gap: 10 (ref 5–15)
BUN: 12 mg/dL (ref 6–20)
CO2: 29 mmol/L (ref 22–32)
Calcium: 9.5 mg/dL (ref 8.9–10.3)
Chloride: 100 mmol/L — ABNORMAL LOW (ref 101–111)
Creatinine, Ser: 0.68 mg/dL (ref 0.44–1.00)
GFR calc Af Amer: >60 mL/min (ref >60)
GLUCOSE: 219 mg/dL — AB (ref 65–99)
POTASSIUM: 4.3 mmol/L (ref 3.5–5.1)
Sodium: 139 mmol/L (ref 135–145)

## 2014-10-26 MED ORDER — GABAPENTIN 300 MG PO CAPS
300.0000 mg | ORAL_CAPSULE | Freq: Two times a day (BID) | ORAL | Status: DC
  Administered 2014-10-26 – 2014-10-28 (×3): 300 mg via ORAL
  Filled 2014-10-26 (×3): qty 1

## 2014-10-26 MED ORDER — INSULIN GLARGINE 100 UNIT/ML ~~LOC~~ SOLN
10.0000 [IU] | Freq: Every day | SUBCUTANEOUS | Status: DC
  Administered 2014-10-26 – 2014-10-27 (×2): 10 [IU] via SUBCUTANEOUS
  Filled 2014-10-26 (×3): qty 0.1

## 2014-10-26 MED ORDER — LISINOPRIL 40 MG PO TABS
40.0000 mg | ORAL_TABLET | Freq: Every day | ORAL | Status: DC
  Administered 2014-10-26 – 2014-10-28 (×2): 40 mg via ORAL
  Filled 2014-10-26: qty 1

## 2014-10-26 MED ORDER — HYDROCODONE-ACETAMINOPHEN 5-325 MG PO TABS
1.0000 | ORAL_TABLET | ORAL | Status: DC | PRN
  Administered 2014-10-26 – 2014-10-28 (×2): 2 via ORAL
  Filled 2014-10-26 (×2): qty 2

## 2014-10-26 MED ORDER — POTASSIUM CHLORIDE IN NACL 20-0.45 MEQ/L-% IV SOLN
INTRAVENOUS | Status: DC
  Administered 2014-10-27 (×2): via INTRAVENOUS
  Filled 2014-10-26 (×5): qty 1000

## 2014-10-26 MED ORDER — ONDANSETRON HCL 4 MG PO TABS: 4.0000 mg | ORAL_TABLET | Freq: Once | ORAL | Status: DC

## 2014-10-26 MED ORDER — CHLORHEXIDINE GLUCONATE 4 % EX LIQD
60.0000 mL | Freq: Once | CUTANEOUS | Status: DC
  Filled 2014-10-26: qty 60

## 2014-10-26 MED ORDER — CEFAZOLIN SODIUM-DEXTROSE 2-3 GM-% IV SOLR
2.0000 g | INTRAVENOUS | Status: AC
  Administered 2014-10-27: 2 g via INTRAVENOUS
  Filled 2014-10-26: qty 50

## 2014-10-26 MED ORDER — LISINOPRIL 20 MG PO TABS: 20.0000 mg | ORAL_TABLET | Freq: Every day | ORAL | Status: DC

## 2014-10-26 MED ORDER — INSULIN GLARGINE 100 UNIT/ML ~~LOC~~ SOLN
8.0000 [IU] | Freq: Every day | SUBCUTANEOUS | Status: DC
  Filled 2014-10-26: qty 0.08

## 2014-10-26 MED ORDER — HYDROCODONE-ACETAMINOPHEN 5-325 MG PO TABS: 1.0000 | ORAL_TABLET | ORAL | Status: DC | PRN

## 2014-10-26 MED ORDER — ATORVASTATIN CALCIUM 40 MG PO TABS
40.0000 mg | ORAL_TABLET | Freq: Every day | ORAL | Status: DC
  Administered 2014-10-26 – 2014-10-28 (×3): 40 mg via ORAL
  Filled 2014-10-26 (×3): qty 1

## 2014-10-26 MED ORDER — ACETAMINOPHEN 500 MG PO TABS
1000.0000 mg | ORAL_TABLET | Freq: Once | ORAL | Status: AC
  Administered 2014-10-26: 1000 mg via ORAL
  Filled 2014-10-26: qty 2

## 2014-10-26 MED ORDER — INSULIN ASPART 100 UNIT/ML ~~LOC~~ SOLN
0.0000 [IU] | Freq: Three times a day (TID) | SUBCUTANEOUS | Status: DC
  Administered 2014-10-26 – 2014-10-27 (×3): 3 [IU] via SUBCUTANEOUS
  Administered 2014-10-27: 1 [IU] via SUBCUTANEOUS
  Administered 2014-10-28 (×2): 2 [IU] via SUBCUTANEOUS
  Administered 2014-10-28: 1 [IU] via SUBCUTANEOUS

## 2014-10-26 NOTE — Consult Note (Signed)
[facility] Medical Consultation  [name] ZOX:[id number] DOB: [birth year] DOA: 10/26/2014 PCP: No PCP Per Patient   [facility]   Requesting physician: Dr. Mckinley Jewel Date of consultation: 10/26/2014 Reason for consultation: General Medical Management  Chief Complaint: Presents for left shoulder replacement surgery.  HPI:  History was gathered with the assistance of a Research officer, trade union.   Meghan Ford is a 74 year old Spanish-speaking female with a history of uncontrolled insulin dependent diabetes, uncontrolled hypertension, hyperlipidemia, obesity and nonunion left humerus fracture. She presents to the hospital for scheduled shoulder replacement surgery and has been admitted by Orthopedic Surgery.  [facility] has been consulted for general medical management and preoperative clearance.  Meghan Ford initially fractured her left humerus Aug 01, 2014 when she fell on her left side. She endorses pain in left shoulder of which she takes Tramadol for relief. Patient denies history of heart disease, MI, valvular disorders, CVA, pulmonary disorders, or coagulopathies.    Of note, patient was recently hospitalized for Sepsis in June 2016 with blood cultures growing 1 out of 2 gram-negative rods. She was discharged on Ciprofloxacin. Patient endorses completion of Ciprofloxacin with no residual symptoms.    Impression/Recommendations Principal Problem:   Proximal humerus fracture Active Problems:   Diabetes mellitus type II, uncontrolled   Uncontrolled hypertension   Diabetes mellitus with neuropathy   Nonunion left humerus fracture Management per primary service. Scheduled for surgery 10/27/2014.  Uncontrolled Diabetes Mellitus with peripheral neuropathy Last hgb A1c was 10.4 in June 2016, started on lantus, checking hemoglobin A1c Continue Lantus 8 units daily at bedtime; sliding scale insulin-sensitive with CBGs before  meals and at bedtime; titrate insulin as needed. Continue gabapentin PRN for neuropathy.   Hypertension Continue lisinopril 40 mg daily. Monitor BPs and kidney function.   Hyperlipidemia Continue atorvastatin  Preoperative risk assessment / clearance  CBC, Bmet, EKG, 2-D echo are pending   . Patient has a history of uncontrolled hypertension, uncontrolled insulin dependent diabetes, and peripheral artery disease. She denies history of heart disease including myocardial infarction, valvular disorders, or stent placement. Today, she denies angina, shortness of breath, dyspnea, or lower extremity swelling.   . Cardiac functional status : Patient states she can walk 20 minutes without shortness of breath and can climb two flights of stairs without shortness of breath.   . EKG from 09/27/14 showing normal sinus rhythm with possible old inferior infarct, QTc . No Q waves or significant ST-segment elevation or depression, no left ventricular hypertrophy, QTc prolongation, bundle-branch block, or arrhythmia.  . Echo from March 2014 showed LVEF 60-65%, no wall motion abnormalities, no diastolic dysfunction noted. Valves were structurally normal, with trivial pulmonic valve regurgitation.    . Using the [facility] preoperative cardiac risk calculator, the patient's estimated risk probability for perioperative MI or cardiac arrest is 0.7%. (https://www.lewis-glenn.com/)   Patient has no significant cardiac history, but does have multiple CAD risk factors to include obesity, DM, HTN, and hyperlipidemia placing patient in low to moderate risk for perioperative complications.     [facility] will followup again tomorrow. Please contact me if I can be of assistance in the meanwhile. Thank you for this consultation.   Review of Systems:  General: Denies fever, chills, night sweats ENT: Denies nasal congestion or sore throat Respiratory: Denies shortness of breath, dyspnea, or  cough Cardiovascular: denies chest pain, leg swelling GI: Denies abdominal pain, nausea, or vomiting. Endorses regular bowel movements.  GU: denies urinary complaints Neuro: endorses paraesthesias b/l in lower extremities  Past Medical History  Diagnosis Date  . Diabetes mellitus type II, uncontrolled   . Hypertension   . Peripheral vascular disease   . Leg pain     worse with prolonged standing  . Varicose veins   . Hyperlipidemia LDL goal < 100   . Diabetes mellitus without complication   . Fx humeral neck    Past Surgical History  Procedure Laterality Date  . Other surgical history      reports varicose vein procedure  . Vein surgery      left leg   Social History:  reports that she has never smoked. She does not have any smokeless tobacco history on file. She reports that she does not drink alcohol or use illicit drugs. She lives with a caregiver is able to ambulate without the need for assistive devices and is independent in her activities of daily living. She endorses being able to walk 20 minutes and climb 2 flights of stairs without becoming short of breath.  No Known Allergies Family History  Problem Relation Age of Onset  . Cancer Brother   . Heart disease Sister   Sister history of valvular heart disease, currently being treated.   Prior to Admission medications   Medication Sig Start Date End Date Taking? Authorizing Provider  acetaminophen (TYLENOL) 500 MG tablet Take 1 tablet (500 mg total) by mouth every 6 (six) hours as needed for pain. 04/29/12   [place] Cyndie Mull, MD  acyclovir (ZOVIRAX) 400 MG tablet Take 2 tablets (800 mg total) by mouth 5 (five) times daily. 11/15/12   Felicie Morn, NP  atorvastatin (LIPITOR) 40 MG tablet Take 1 tablet (40 mg total) by mouth daily. 10/23/14   Jaclyn Shaggy, MD  ciprofloxacin (CIPRO) 500 MG tablet Take 1 tablet (500 mg total) by mouth 2 (two) times daily. Patient not taking: Reported on 10/22/2014 10/01/14   Jeralyn Bennett, MD   gabapentin (NEURONTIN) 300 MG capsule Take 1 capsule (300 mg total) by mouth 2 (two) times daily. 10/22/14   Jaclyn Shaggy, MD  glucose blood test strip Use as instructed 10/15/12   Quentin Angst, MD  HYDROcodone-acetaminophen (NORCO/VICODIN) 5-325 MG per tablet Take 1 tablet by mouth every 4 (four) hours as needed. Patient not taking: Reported on 10/22/2014 08/04/14   Everlene Farrier, PA-C  ibuprofen (ADVIL,MOTRIN) 600 MG tablet Take 1 tablet (600 mg total) by mouth every 6 (six) hours as needed for pain (por dolor en espalda.  Toma con comida.). 10/15/12   Quentin Angst, MD  insulin aspart (NOVOLOG) 100 unit/ml SOLN Inject 12 Units into the skin 2 (two) times daily. 10/15/12   Quentin Angst, MD  insulin glargine (LANTUS) 100 UNIT/ML injection Inject 0.08 mLs (8 Units total) into the skin at bedtime. 10/22/14   Jaclyn Shaggy, MD  lisinopril (PRINIVIL,ZESTRIL) 40 MG tablet Take 1 tablet (40 mg total) by mouth daily. 10/22/14   Jaclyn Shaggy, MD  oxyCODONE-acetaminophen (PERCOCET) 5-325 MG per tablet Take 1 tablet by mouth every 4 (four) hours as needed for pain. 12/01/12   Tatyana Kirichenko, PA-C  traMADol (ULTRAM) 50 MG tablet Take 1 tablet (50 mg total) by mouth every 8 (eight) hours as needed. 10/08/14   Jaclyn Shaggy, MD   Physical Exam: Blood pressure 159/72, pulse 64, temperature 98.9 F (37.2 C), resp. rate 16, SpO2 97 %. Filed Vitals:   10/26/14 1036  BP: 159/72  Pulse: 64  Temp: 98.9 F (37.2 C)  Resp: 16     General:  Meghan Ford is a pleasant 74yo female, resting in bed comfortable. She is alert and oriented and in no acute distress.   Eyes: No scleral icterus, conjunctiva are clear, pupils are equal and round  ENT: moist mucous membranes, dentures noted  Neck: supple, no presence of JVD  Cardiovascular: regular rate and rhythm. No murmurs, gallops, or rubs. No lower extremity edema noted. 2+ peripheral pulses  Respiratory: Good air movement. Lungs are clear to  auscultation bilaterally. No wheezes, rhonchi, or rales heard.   Abdomen: obese, soft, non-distended, non-tender, positive bowel sounds  Skin: warm and dry, pallor noted on lower extremities bilaterally  Musculoskeletal: Moves all four  Psychiatric: Alert and oriented. Thought content appropriate and goal oriented.   Neurologic: focal grossly intact, CN grossly intact  Labs on Admission:  Basic Metabolic Panel:  Recent Labs Lab 10/22/14 1009 10/26/14 1109  NA 143 139  K 4.8 4.3  CL 104 100*  CO2 30 29  GLUCOSE 141* 219*  BUN 16 12  CREATININE 0.77 0.68  CALCIUM 9.2 9.5   Liver Function Tests:  Recent Labs Lab 10/22/14 1009  AST 15  ALT 11  ALKPHOS 75  BILITOT 0.7  PROT 6.8  ALBUMIN 3.9   CBC:  Recent Labs Lab 10/26/14 1109  WBC 9.5  HGB 14.9  HCT 47.3*  MCV 81.3  PLT 226   CBG:  Recent Labs Lab 10/26/14 1114  GLUCAP 204*    EKG: Independently reviewed. Pending   Time spent: 45 min.  Arvilla Meres, PA-S [facility] Pager [phone]  If 7PM-7AM, please contact night-coverage [email or web address] Password [email or web address] 10/26/2014, 1:10 PM

## 2014-10-27 ENCOUNTER — Inpatient Hospital Stay (HOSPITAL_COMMUNITY): Payer: Self-pay | Admitting: Certified Registered Nurse Anesthetist

## 2014-10-27 ENCOUNTER — Inpatient Hospital Stay (HOSPITAL_COMMUNITY): Payer: Self-pay

## 2014-10-27 ENCOUNTER — Encounter (HOSPITAL_COMMUNITY)
Admission: RE | Disposition: A | Discharge status: Home / Self Care | Payer: Self-pay | Source: Ambulatory Visit | Attending: Orthopedic Surgery

## 2014-10-27 DIAGNOSIS — Z96612 Presence of left artificial shoulder joint: Secondary | ICD-10-CM

## 2014-10-27 DIAGNOSIS — D72829 Elevated white blood cell count, unspecified: Secondary | ICD-10-CM | POA: Insufficient documentation

## 2014-10-27 DIAGNOSIS — Z96619 Presence of unspecified artificial shoulder joint: Secondary | ICD-10-CM | POA: Insufficient documentation

## 2014-10-27 HISTORY — PX: REVERSE SHOULDER ARTHROPLASTY: SHX5054

## 2014-10-27 HISTORY — PX: TOTAL SHOULDER ARTHROPLASTY: SHX126

## 2014-10-27 LAB — URINALYSIS, ROUTINE W REFLEX MICROSCOPIC
BILIRUBIN URINE: NEGATIVE
GLUCOSE, UA: NEGATIVE mg/dL
HGB URINE DIPSTICK: NEGATIVE
Ketones, ur: NEGATIVE mg/dL
Leukocytes, UA: NEGATIVE
Nitrite: NEGATIVE
PH: 7 (ref 5.0–8.0)
Protein, ur: NEGATIVE mg/dL
SPECIFIC GRAVITY, URINE: 1.012 (ref 1.005–1.030)
UROBILINOGEN UA: 0.2 mg/dL (ref 0.0–1.0)

## 2014-10-27 LAB — CBC
HEMATOCRIT: 42.6 % (ref 36.0–46.0)
HEMOGLOBIN: 13.8 g/dL (ref 12.0–15.0)
MCH: 25.9 pg — AB (ref 26.0–34.0)
MCHC: 32.4 g/dL (ref 30.0–36.0)
MCV: 79.9 fL (ref 78.0–100.0)
Platelets: 170 10*3/uL (ref 150–400)
RBC: 5.33 MIL/uL — AB (ref 3.87–5.11)
RDW: 14.7 % (ref 11.5–15.5)
WBC: 17 10*3/uL — ABNORMAL HIGH (ref 4.0–10.5)

## 2014-10-27 LAB — BASIC METABOLIC PANEL
ANION GAP: 15 (ref 5–15)
BUN: 12 mg/dL (ref 6–20)
CHLORIDE: 101 mmol/L (ref 101–111)
CO2: 20 mmol/L — AB (ref 22–32)
Calcium: 8.6 mg/dL — ABNORMAL LOW (ref 8.9–10.3)
Creatinine, Ser: 0.76 mg/dL (ref 0.44–1.00)
GFR calc non Af Amer: >60 mL/min (ref >60)
GLUCOSE: 202 mg/dL — AB (ref 65–99)
Potassium: 4.1 mmol/L (ref 3.5–5.1)
Sodium: 136 mmol/L (ref 135–145)

## 2014-10-27 LAB — GLUCOSE, CAPILLARY
GLUCOSE-CAPILLARY: 160 mg/dL — AB (ref 65–99)
GLUCOSE-CAPILLARY: 178 mg/dL — AB (ref 65–99)
Glucose-Capillary: 130 mg/dL — ABNORMAL HIGH (ref 65–99)
Glucose-Capillary: 219 mg/dL — ABNORMAL HIGH (ref 65–99)

## 2014-10-27 LAB — HEMOGLOBIN A1C
Hgb A1c MFr Bld: 9.4 % — ABNORMAL HIGH (ref 4.8–5.6)
MEAN PLASMA GLUCOSE: 223 mg/dL

## 2014-10-27 LAB — PROTIME-INR
INR: 1.05 (ref 0.00–1.49)
Prothrombin Time: 13.9 seconds (ref 11.6–15.2)

## 2014-10-27 LAB — SURGICAL PCR SCREEN
MRSA, PCR: NEGATIVE
STAPHYLOCOCCUS AUREUS: NEGATIVE

## 2014-10-27 SURGERY — ARTHROPLASTY, SHOULDER, TOTAL
Anesthesia: General | Site: Shoulder | Laterality: Left

## 2014-10-27 MED ORDER — ASPIRIN 325 MG PO TABS: 325.0000 mg | ORAL_TABLET | Freq: Every day | ORAL | Status: DC

## 2014-10-27 MED ORDER — METOCLOPRAMIDE HCL 5 MG PO TABS: 5.0000 mg | ORAL_TABLET | Freq: Three times a day (TID) | ORAL | Status: DC | PRN

## 2014-10-27 MED ORDER — HYDROMORPHONE HCL 1 MG/ML IJ SOLN
1.0000 mg | INTRAMUSCULAR | Status: DC | PRN
  Administered 2014-10-27 – 2014-10-28 (×3): 1 mg via INTRAVENOUS
  Filled 2014-10-27 (×3): qty 1

## 2014-10-27 MED ORDER — NEOSTIGMINE METHYLSULFATE 10 MG/10ML IV SOLN
INTRAVENOUS | Status: AC
  Filled 2014-10-27: qty 1

## 2014-10-27 MED ORDER — PHENYLEPHRINE HCL 10 MG/ML IJ SOLN: 10.0000 mg | INTRAVENOUS | Status: DC | PRN

## 2014-10-27 MED ORDER — CEFAZOLIN SODIUM-DEXTROSE 2-3 GM-% IV SOLR
2.0000 g | Freq: Four times a day (QID) | INTRAVENOUS | Status: AC
  Administered 2014-10-27 – 2014-10-28 (×3): 2 g via INTRAVENOUS
  Filled 2014-10-27 (×5): qty 50

## 2014-10-27 MED ORDER — ONDANSETRON HCL 4 MG/2ML IJ SOLN: 4.0000 mg | Freq: Four times a day (QID) | INTRAMUSCULAR | Status: DC | PRN

## 2014-10-27 MED ORDER — OXYCODONE HCL 5 MG PO TABS
5.0000 mg | ORAL_TABLET | ORAL | Status: DC | PRN
  Administered 2014-10-27 – 2014-10-28 (×3): 10 mg via ORAL
  Filled 2014-10-27 (×4): qty 2

## 2014-10-27 MED ORDER — NEOSTIGMINE METHYLSULFATE 10 MG/10ML IV SOLN
INTRAVENOUS | Status: DC | PRN
  Administered 2014-10-27: 3 mg via INTRAVENOUS

## 2014-10-27 MED ORDER — HYDROMORPHONE HCL 1 MG/ML IJ SOLN: 0.2500 mg | INTRAMUSCULAR | Status: DC | PRN

## 2014-10-27 MED ORDER — ROCURONIUM BROMIDE 100 MG/10ML IV SOLN
INTRAVENOUS | Status: DC | PRN
  Administered 2014-10-27: 30 mg via INTRAVENOUS

## 2014-10-27 MED ORDER — SODIUM CHLORIDE 0.9 % IV SOLN
INTRAVENOUS | Status: DC
  Administered 2014-10-28: 06:00:00 via INTRAVENOUS

## 2014-10-27 MED ORDER — GLYCOPYRROLATE 0.2 MG/ML IJ SOLN
INTRAMUSCULAR | Status: AC
  Filled 2014-10-27: qty 3

## 2014-10-27 MED ORDER — DOCUSATE SODIUM 100 MG PO CAPS
100.0000 mg | ORAL_CAPSULE | Freq: Two times a day (BID) | ORAL | Status: DC
  Administered 2014-10-27 – 2014-10-28 (×2): 100 mg via ORAL
  Filled 2014-10-27 (×2): qty 1

## 2014-10-27 MED ORDER — SUCCINYLCHOLINE CHLORIDE 20 MG/ML IJ SOLN
INTRAMUSCULAR | Status: DC | PRN
  Administered 2014-10-27: 100 mg via INTRAVENOUS

## 2014-10-27 MED ORDER — SODIUM CHLORIDE 0.9 % IV SOLN: 10000.0000 ug | INTRAVENOUS | Status: DC | PRN

## 2014-10-27 MED ORDER — PROMETHAZINE HCL 25 MG/ML IJ SOLN: 6.2500 mg | INTRAMUSCULAR | Status: DC | PRN

## 2014-10-27 MED ORDER — METOCLOPRAMIDE HCL 5 MG/ML IJ SOLN
5.0000 mg | Freq: Three times a day (TID) | INTRAMUSCULAR | Status: DC | PRN
  Administered 2014-10-27: 10 mg via INTRAVENOUS
  Filled 2014-10-27: qty 2

## 2014-10-27 MED ORDER — ONDANSETRON HCL 4 MG PO TABS: 4.0000 mg | ORAL_TABLET | Freq: Four times a day (QID) | ORAL | Status: DC | PRN

## 2014-10-27 MED ORDER — GLYCOPYRROLATE 0.2 MG/ML IJ SOLN
INTRAMUSCULAR | Status: DC | PRN
  Administered 2014-10-27: 0.4 mg via INTRAVENOUS

## 2014-10-27 MED ORDER — ACETAMINOPHEN 650 MG RE SUPP: 650.0000 mg | Freq: Four times a day (QID) | RECTAL | Status: DC | PRN

## 2014-10-27 MED ORDER — LACTATED RINGERS IV SOLN
INTRAVENOUS | Status: DC | PRN
  Administered 2014-10-27 (×2): via INTRAVENOUS

## 2014-10-27 MED ORDER — ARTIFICIAL TEARS OP OINT
TOPICAL_OINTMENT | OPHTHALMIC | Status: AC
  Filled 2014-10-27: qty 3.5

## 2014-10-27 MED ORDER — SUCCINYLCHOLINE CHLORIDE 20 MG/ML IJ SOLN
INTRAMUSCULAR | Status: AC
  Filled 2014-10-27: qty 1

## 2014-10-27 MED ORDER — PROPOFOL 10 MG/ML IV BOLUS
INTRAVENOUS | Status: AC
  Filled 2014-10-27: qty 20

## 2014-10-27 MED ORDER — PROPOFOL 10 MG/ML IV BOLUS
INTRAVENOUS | Status: DC | PRN
  Administered 2014-10-27: 140 mg via INTRAVENOUS

## 2014-10-27 MED ORDER — ROCURONIUM BROMIDE 50 MG/5ML IV SOLN
INTRAVENOUS | Status: AC
  Filled 2014-10-27: qty 1

## 2014-10-27 MED ORDER — FENTANYL CITRATE (PF) 100 MCG/2ML IJ SOLN
INTRAMUSCULAR | Status: DC | PRN
  Administered 2014-10-27 (×2): 50 ug via INTRAVENOUS

## 2014-10-27 MED ORDER — LACTATED RINGERS IV SOLN
INTRAVENOUS | Status: DC
  Administered 2014-10-27: 10:00:00 via INTRAVENOUS

## 2014-10-27 MED ORDER — OXYCODONE-ACETAMINOPHEN 5-325 MG PO TABS: 1.0000 | ORAL_TABLET | ORAL | Status: DC | PRN

## 2014-10-27 MED ORDER — ONDANSETRON HCL 4 MG/2ML IJ SOLN
INTRAMUSCULAR | Status: AC
  Filled 2014-10-27: qty 2

## 2014-10-27 MED ORDER — ROPIVACAINE HCL 5 MG/ML IJ SOLN
INTRAMUSCULAR | Status: DC | PRN
  Administered 2014-10-27: 30 mL via PERINEURAL

## 2014-10-27 MED ORDER — LIDOCAINE HCL (CARDIAC) 20 MG/ML IV SOLN
INTRAVENOUS | Status: AC
  Filled 2014-10-27: qty 5

## 2014-10-27 MED ORDER — CELECOXIB 200 MG PO CAPS
200.0000 mg | ORAL_CAPSULE | Freq: Two times a day (BID) | ORAL | Status: DC
  Administered 2014-10-27 – 2014-10-28 (×2): 200 mg via ORAL
  Filled 2014-10-27 (×2): qty 1

## 2014-10-27 MED ORDER — SODIUM CHLORIDE 0.9 % IR SOLN
Status: DC | PRN
  Administered 2014-10-27: 1000 mL

## 2014-10-27 MED ORDER — DOCUSATE SODIUM 100 MG PO CAPS: 100.0000 mg | ORAL_CAPSULE | Freq: Two times a day (BID) | ORAL | Status: DC

## 2014-10-27 MED ORDER — ONDANSETRON HCL 4 MG PO TABS: 4.0000 mg | ORAL_TABLET | Freq: Three times a day (TID) | ORAL | Status: DC | PRN

## 2014-10-27 MED ORDER — FENTANYL CITRATE (PF) 250 MCG/5ML IJ SOLN
INTRAMUSCULAR | Status: AC
  Filled 2014-10-27: qty 5

## 2014-10-27 MED ORDER — MIDAZOLAM HCL 5 MG/5ML IJ SOLN
INTRAMUSCULAR | Status: DC | PRN
  Administered 2014-10-27: 2 mg via INTRAVENOUS

## 2014-10-27 MED ORDER — ONDANSETRON HCL 4 MG/2ML IJ SOLN
INTRAMUSCULAR | Status: DC | PRN
  Administered 2014-10-27: 4 mg via INTRAVENOUS

## 2014-10-27 MED ORDER — MIDAZOLAM HCL 2 MG/2ML IJ SOLN
INTRAMUSCULAR | Status: AC
  Filled 2014-10-27: qty 2

## 2014-10-27 MED ORDER — MIDAZOLAM HCL 2 MG/2ML IJ SOLN
INTRAMUSCULAR | Status: AC
  Filled 2014-10-27: qty 4

## 2014-10-27 MED ORDER — FENTANYL CITRATE (PF) 100 MCG/2ML IJ SOLN
INTRAMUSCULAR | Status: AC
  Filled 2014-10-27: qty 2

## 2014-10-27 MED ORDER — AMLODIPINE BESYLATE 5 MG PO TABS
5.0000 mg | ORAL_TABLET | Freq: Every day | ORAL | Status: DC
  Administered 2014-10-28: 5 mg via ORAL
  Filled 2014-10-27: qty 1

## 2014-10-27 MED ORDER — LIDOCAINE HCL (CARDIAC) 20 MG/ML IV SOLN
INTRAVENOUS | Status: DC | PRN
  Administered 2014-10-27: 60 mg via INTRAVENOUS

## 2014-10-27 MED ORDER — ACETAMINOPHEN 325 MG PO TABS: 650.0000 mg | ORAL_TABLET | Freq: Four times a day (QID) | ORAL | Status: DC | PRN

## 2014-10-27 MED ORDER — PHENYLEPHRINE HCL 10 MG/ML IJ SOLN: 10.0000 mg | INTRAMUSCULAR | Status: DC | PRN

## 2014-10-27 MED ORDER — PHENYLEPHRINE HCL 10 MG/ML IJ SOLN
10.0000 mg | INTRAVENOUS | Status: DC | PRN
  Administered 2014-10-27: 60 ug/min via INTRAVENOUS

## 2014-10-27 SURGICAL SUPPLY — 57 items
BIT DRILL TWIST 2.7 (BIT) IMPLANT
BIT DRILL TWIST 2.7MM (BIT)
BLADE SAW SAG 73X25 THK (BLADE) ×2
BLADE SAW SGTL 73X25 THK (BLADE) ×1 IMPLANT
CAPT SHLDR REVTOTAL 2 ×2 IMPLANT
CLOSURE WOUND 1/2 X4 (GAUZE/BANDAGES/DRESSINGS) ×1
COVER SURGICAL LIGHT HANDLE (MISCELLANEOUS) ×3 IMPLANT
DRAPE IMP U-DRAPE 54X76 (DRAPES) ×9 IMPLANT
DRAPE INCISE IOBAN 66X45 STRL (DRAPES) ×3 IMPLANT
DRAPE U-SHAPE 47X51 STRL (DRAPES) ×3 IMPLANT
DRILL BIT 5/64 (BIT) IMPLANT
DRSG ADAPTIC 3X8 NADH LF (GAUZE/BANDAGES/DRESSINGS) ×3 IMPLANT
DRSG MEPILEX BORDER 4X8 (GAUZE/BANDAGES/DRESSINGS) ×2 IMPLANT
DRSG PAD ABDOMINAL 8X10 ST (GAUZE/BANDAGES/DRESSINGS) ×4 IMPLANT
DURAPREP 26ML APPLICATOR (WOUND CARE) ×3 IMPLANT
ELECT REM PT RETURN 9FT ADLT (ELECTROSURGICAL) ×3
ELECTRODE REM PT RTRN 9FT ADLT (ELECTROSURGICAL) ×1 IMPLANT
GAUZE SPONGE 4X4 12PLY STRL (GAUZE/BANDAGES/DRESSINGS) ×3 IMPLANT
GLOVE BIO SURGEON STRL SZ7 (GLOVE) ×3 IMPLANT
GLOVE BIO SURGEON STRL SZ7.5 (GLOVE) ×5 IMPLANT
GLOVE BIOGEL PI IND STRL 7.0 (GLOVE) ×1 IMPLANT
GLOVE BIOGEL PI IND STRL 8 (GLOVE) ×2 IMPLANT
GLOVE BIOGEL PI INDICATOR 7.0 (GLOVE) ×2
GLOVE BIOGEL PI INDICATOR 8 (GLOVE) ×2
GLOVE BIOGEL PI ORTHO PRO SZ8 (GLOVE)
GLOVE PI ORTHO PRO STRL SZ8 (GLOVE) ×1 IMPLANT
GLOVE SURG ORTHO 8.0 STRL STRW (GLOVE) ×3 IMPLANT
GOWN STRL REUS W/ TWL LRG LVL3 (GOWN DISPOSABLE) ×1 IMPLANT
GOWN STRL REUS W/TWL 2XL LVL3 (GOWN DISPOSABLE) ×3 IMPLANT
GOWN STRL REUS W/TWL LRG LVL3 (GOWN DISPOSABLE) ×9
KIT BASIN OR (CUSTOM PROCEDURE TRAY) ×3 IMPLANT
KIT ROOM TURNOVER OR (KITS) ×3 IMPLANT
MANIFOLD NEPTUNE II (INSTRUMENTS) ×3 IMPLANT
NDL HYPO 25GX1X1/2 BEV (NEEDLE) ×1 IMPLANT
NDL SUT .5 MAYO 1.404X.05X (NEEDLE) ×1 IMPLANT
NEEDLE HYPO 25GX1X1/2 BEV (NEEDLE) ×3 IMPLANT
NEEDLE MAYO TAPER (NEEDLE) ×3
NS IRRIG 1000ML POUR BTL (IV SOLUTION) ×3 IMPLANT
PACK SHOULDER (CUSTOM PROCEDURE TRAY) ×3 IMPLANT
PACK UNIVERSAL I (CUSTOM PROCEDURE TRAY) ×3 IMPLANT
PAD ARMBOARD 7.5X6 YLW CONV (MISCELLANEOUS) ×6 IMPLANT
PIN THREADED REVERSE (PIN) IMPLANT
SLING ARM IMMOBILIZER LRG (SOFTGOODS) ×3 IMPLANT
SLING ARM IMMOBILIZER MED (SOFTGOODS) IMPLANT
SPONGE LAP 18X18 X RAY DECT (DISPOSABLE) ×3 IMPLANT
STRIP CLOSURE SKIN 1/2X4 (GAUZE/BANDAGES/DRESSINGS) ×2 IMPLANT
SUCTION FRAZIER TIP 10 FR DISP (SUCTIONS) ×3 IMPLANT
SUPPORT WRAP ARM LG (MISCELLANEOUS) ×2 IMPLANT
SUT FIBERWIRE #2 38 T-5 BLUE (SUTURE) ×12
SUT MNCRL AB 4-0 PS2 18 (SUTURE) ×3 IMPLANT
SUT MON AB 2-0 CT1 36 (SUTURE) ×3 IMPLANT
SUT VIC AB 0 CT1 27 (SUTURE) ×3
SUT VIC AB 0 CT1 27XBRD ANBCTR (SUTURE) ×1 IMPLANT
SUTURE FIBERWR #2 38 T-5 BLUE (SUTURE) ×4 IMPLANT
TOWEL OR 17X24 6PK STRL BLUE (TOWEL DISPOSABLE) ×3 IMPLANT
TOWEL OR 17X26 10 PK STRL BLUE (TOWEL DISPOSABLE) ×3 IMPLANT
TOWER CARTRIDGE SMART MIX (DISPOSABLE) IMPLANT

## 2014-10-27 NOTE — Progress Notes (Signed)
PT Cancellation Note  Patient Details Name: Meghan Ford MRN: [id number] DOB: 16-Aug-1940   Cancelled Treatment:    Reason Eval/Treat Not Completed: Other (comment) (Decline per nursing request. ) Nursing reports patient not feeling well and just returned from bathroom. Not appropriate for PT services at this time. Will continue to follow.     Christiane Ha, PT, CSCS Pager [phone] Office [phone]  10/27/2014, 3:48 PM

## 2014-10-27 NOTE — Anesthesia Preprocedure Evaluation (Addendum)
Anesthesia Evaluation  Patient identified by MRN, date of birth, ID band Patient awake    Reviewed: Allergy & Precautions, NPO status , Patient's Chart, lab work & pertinent test results  History of Anesthesia Complications Negative for: history of anesthetic complications  Airway Mallampati: II  TM Distance: >3 FB Neck ROM: Full    Dental  (+) Edentulous Upper   Pulmonary neg pulmonary ROS,  breath sounds clear to auscultation        Cardiovascular hypertension, + Peripheral Vascular Disease Rhythm:Regular Rate:Normal     Neuro/Psych  Headaches,    GI/Hepatic   Endo/Other  diabetesMorbid obesity  Renal/GU      Musculoskeletal   Abdominal (+) + obese,   Peds  Hematology negative hematology ROS (+)   Anesthesia Other Findings   Reproductive/Obstetrics                            Anesthesia Physical Anesthesia Plan  ASA: III  Anesthesia Plan: General   Post-op Pain Management: GA combined w/ Regional for post-op pain   Induction: Intravenous  Airway Management Planned: Oral ETT  Additional Equipment:   Intra-op Plan:   Post-operative Plan: Extubation in OR  Informed Consent: I have reviewed the patients History and Physical, chart, labs and discussed the procedure including the risks, benefits and alternatives for the proposed anesthesia with the patient or authorized representative who has indicated his/her understanding and acceptance.   Dental advisory given  Plan Discussed with: CRNA and Surgeon  Anesthesia Plan Comments:        Anesthesia Quick Evaluation

## 2014-10-27 NOTE — Transfer of Care (Signed)
Immediate Anesthesia Transfer of Care Note  Patient: Meghan Ford  Procedure(s) Performed: Procedure(s): TOTAL SHOULDER ARTHROPLASTY (Left) REVERSE SHOULDER ARTHROPLASTY (Left)  Patient Location: PACU  Anesthesia Type:General  Level of Consciousness: sedated, patient cooperative and responds to stimulation  Airway & Oxygen Therapy: Patient Spontanous Breathing and Patient connected to face mask oxygen  Post-op Assessment: Report given to RN, Post -op Vital signs reviewed and stable and Patient moving all extremities X 4  Post vital signs: Reviewed and stable  Last Vitals:  Filed Vitals:   10/27/14 0943  BP: 173/96  Pulse: 67  Temp:   Resp: 24    Complications: No apparent anesthesia complications

## 2014-10-27 NOTE — Anesthesia Postprocedure Evaluation (Signed)
  Anesthesia Post-op Note  Patient: Meghan Ford  Procedure(s) Performed: Procedure(s): TOTAL SHOULDER ARTHROPLASTY (Left) REVERSE SHOULDER ARTHROPLASTY (Left)  Patient Location: PACU  Anesthesia Type:GA combined with regional for post-op pain  Level of Consciousness: awake and alert   Airway and Oxygen Therapy: Patient Spontanous Breathing  Post-op Pain: mild  Post-op Assessment: Post-op Vital signs reviewed and Patient's Cardiovascular Status Stable              Post-op Vital Signs: stable  Last Vitals:  Filed Vitals:   10/27/14 1329  BP: 151/61  Pulse: 66  Temp: 36.4 C  Resp: 16    Complications: No apparent anesthesia complications

## 2014-10-27 NOTE — Op Note (Signed)
10/26/2014 - 10/27/2014  12:31 PM  PATIENT:  Meghan Ford    PRE-OPERATIVE DIAGNOSIS:  LEFT HUMERUS FRACTURE NONUNION  POST-OPERATIVE DIAGNOSIS:  Same  PROCEDURE:  TOTAL SHOULDER ARTHROPLASTY, REVERSE SHOULDER ARTHROPLASTY  SURGEON:  Kirby Argueta D, MD  PHYSICIAN ASSISTANT: Janalee Dane, PA-C, She was present and scrubbed throughout the case, critical for completion in a timely fashion, and for retraction, instrumentation, and closure.   ANESTHESIA:   General  PREOPERATIVE INDICATIONS:  Meghan Ford is a  74 y.o. female with a diagnosis of LEFT HUMERUS FRACTURE NONUNION who failed conservative measures and elected for surgical management.    The risks benefits and alternatives were discussed with the patient preoperatively including but not limited to the risks of infection, bleeding, nerve injury, cardiopulmonary complications, the need for revision surgery, dislocation, brachial plexus palsy, incomplete relief of pain, among others, and the patient was willing to proceed.  OPERATIVE IMPLANTS: Biomet size 8 fracture stemm humeral stem press-fit standard with a 44 mm reverse shoulder arthroplasty tray with a baseplate and 4 locking screws and one central nonlocking screw.  OPERATIVE FINDINGS: nonunion of her fracture  OPERATIVE PROCEDURE: The patient was brought to the operating room and placed in the supine position. General anesthesia was administered. IV antibiotics were given. A Foley was placed. Time out was performed. The upper extremity was prepped and draped in usual sterile fashion. The patient was in a beachchair position. Deltopectoral approach was carried out. The biceps was tenodesed to the pectoralis tendon with #2 Fiberwire. The subscapularis was released off of the bone.   I then removed the articular fracture piece and mobilized the tuberosities.  Deep retractors were placed, and I resected the labrum, and then placed a guidepin into the  center position on the glenoid, with slight inferior inclination. I then reamed over the guidepin, and this created a small metaphyseal cancellus blush inferiorly, removing just the cartilage to the subchondral bone superiorly. The base plate was selected and impacted place, and then I secured it centrally with a nonlocking screw, and I had excellent purchase both inferiorly and superiorly. I placed a short locking screws on anterior and posterior aspects.  I then turned my attention to the glenosphere, and impacted this into place, placing slight inferior offset (set on B).   The glenoid sphere was completely seated, and had engagement of the [facility] taper. I then turned my attention back to the humerus.  I sequentially broached, and then trialed, and was found to restore soft tissue tension, and it had 2 finger tightness. Therefore the above named components were selected. The shoulder felt stable throughout functional motion.  I then impacted the real prosthesis into place, as well as the real humeral tray, and reduced the shoulder. The shoulder had excellent motion, and was stable, and I irrigated the wounds copiously.   Before I placed the real prosthesis I had also placed a total of 3 #2 FiberWire through drill holes in the humerus. I then used these to repair the subscapularis. This came down to bone.  I then irrigated the shoulder copiously once more, repaired the deltopectoral interval with Vicryl followed by subcutaneous Vicryl with Steri-Strips and sterile gauze for the skin. The patient was awakened and returned back in stable and satisfactory condition. There no complications and She tolerated the procedure well.  POST OP PLAN: sling full time ASA 325  This note was generated using a template and dragon dictation system. In light of that, I have reviewed the note and  all aspects of it are applicable to this case. Any dictation errors are due to the computerized dictation system.

## 2014-10-27 NOTE — Progress Notes (Signed)
[facility] PROGRESS NOTE/cONSULT  Meghan Ford ZOX:[id number] DOB: [birth year] DOA: 10/26/2014 PCP: No PCP Per Patient  Assessment/Plan: #1 left humerus fracture non-union Status post reverse shoulder arthroplasty per Dr. Eulah Pont orthopedics 10/27/2014. Per primary.  #2 uncontrolled diabetes mellitus with peripheral neuropathy Hemoglobin A1c is 9.4. CBGs have ranged from 130-219. Continue Lantus and titrate as needed. Continue sliding scale insulin. Continue Neurontin.  #3 hypertension Patient has not received antihypertensives medications yet today. Continue home dose lisinopril. Follow.  #4 leukocytosis Likely a reactive leukocytosis as white count was normal yesterday. Check a UA with cultures and sensitivities. Check a chest x-ray. Follow.  #5 hyperlipidemia Continue statin.  #6 prophylaxis DVT prophylaxis per primary team.  Code Status: Full Family Communication: Updated daughter at bedside. Disposition Plan: Per primary team.   Consultants:  [facility]. Dr. Rexene Edison 10/27/2014  Procedures:  Plain films left shoulder 10/27/2014  Total shoulder arthroplasty, reversed shoulder arthroplasty left Dr. Margarita Rana 10/27/2014  Antibiotics:  None  HPI/Subjective: Patient is postop just returned from the OR. Patient is asleep.  Objective: Filed Vitals:   10/27/14 1329  BP: 151/61  Pulse: 66  Temp: 97.6 F (36.4 C)  Resp: 16    Intake/Output Summary (Last 24 hours) at 10/27/14 1711 Last data filed at 10/27/14 1300  Gross per 24 hour  Intake 1903.33 ml  Output    275 ml  Net 1628.33 ml   There were no vitals filed for this visit.  Exam:   General:  Postop. Sleeping.  Cardiovascular: Regular rate rhythm no murmurs rubs or gallops  Respiratory: Clear to auscultation bilaterally anterior lung fields.  Abdomen: Soft, nontender, nondistended, positive bowel sounds.  Musculoskeletal: No clubbing cyanosis or edema. Left upper  extremity in sling.  Data Reviewed: Basic Metabolic Panel:  Recent Labs Lab 10/22/14 1009 10/26/14 1109  NA 143 139  K 4.8 4.3  CL 104 100*  CO2 30 29  GLUCOSE 141* 219*  BUN 16 12  CREATININE 0.77 0.68  CALCIUM 9.2 9.5   Liver Function Tests:  Recent Labs Lab 10/22/14 1009  AST 15  ALT 11  ALKPHOS 75  BILITOT 0.7  PROT 6.8  ALBUMIN 3.9   No results for input(s): LIPASE, AMYLASE in the last 168 hours. No results for input(s): AMMONIA in the last 168 hours. CBC:  Recent Labs Lab 10/26/14 1109 10/27/14 1645  WBC 9.5 17.0*  HGB 14.9 13.8  HCT 47.3* 42.6  MCV 81.3 79.9  PLT 226 170   Cardiac Enzymes: No results for input(s): CKTOTAL, CKMB, CKMBINDEX, TROPONINI in the last 168 hours. BNP (last 3 results) No results for input(s): BNP in the last 8760 hours.  ProBNP (last 3 results) No results for input(s): PROBNP in the last 8760 hours.  CBG:  Recent Labs Lab 10/26/14 1616 10/26/14 2115 10/27/14 0637 10/27/14 1251 10/27/14 1639  GLUCAP 190* 183* 130* 160* 219*    Recent Results (from the past 240 hour(s))  Surgical pcr screen     Status: None   Collection Time: 10/27/14  8:23 AM  Result Value Ref Range Status   MRSA, PCR NEGATIVE NEGATIVE Final   Staphylococcus aureus NEGATIVE NEGATIVE Final    Comment:        The Xpert SA Assay (FDA approved for NASAL specimens in patients over 30 years of age), is one component of a comprehensive surveillance program.  Test performance has been validated by [facility] for patients greater than or equal to 70 year old. It is not intended  to diagnose infection nor to guide or monitor treatment.      Studies: Dg Shoulder Left Port  10/27/2014   CLINICAL DATA:  Status post reverse left shoulder arthroplasty  EXAM: LEFT SHOULDER - 1 VIEW  COMPARISON:  None in PACs  FINDINGS: The patient has undergone reverse left shoulder arthroplasty. Radiographic position of the prosthetic components is good. The native  bones are osteopenic.  IMPRESSION: The patient has undergone a left reversed shoulder arthroplasty. No postprocedure complication is observed.   Electronically Signed   By: David  [place] M.D.   On: 10/27/2014 13:18    Scheduled Meds: . amLODipine  5 mg Oral Daily  . atorvastatin  40 mg Oral q1800  .  ceFAZolin (ANCEF) IV  2 g Intravenous Q6H  . celecoxib  200 mg Oral Q12H  . docusate sodium  100 mg Oral BID  . gabapentin  300 mg Oral BID  . insulin aspart  0-9 Units Subcutaneous TID WC  . insulin glargine  10 Units Subcutaneous QHS  . lisinopril  40 mg Oral Daily   Continuous Infusions: . 0.45 % NaCl with KCl 20 mEq / L 100 mL/hr at 10/27/14 0700    Principal Problem:   Proximal humerus fracture Active Problems:   Diabetes mellitus type II, uncontrolled   Uncontrolled hypertension   Diabetes mellitus with neuropathy    Time spent: 35 minutes    THOMPSON,DANIEL M.D. [facility] Pager [phone]. If 7PM-7AM, please contact night-coverage at [email or web address], password [facility] 10/27/2014, 5:11 PM  LOS: 1 day

## 2014-10-27 NOTE — Progress Notes (Signed)
Home health PT, OT and aide ordered, unable to set up home therapy for patient due to lack of insurance. [facility] is only agency that works with uninsured patients and they have to follow Medicaid guidelines which do not provide home therapy for patient's diagnosis.Will follow patient for other discharge needs.

## 2014-10-27 NOTE — Anesthesia Procedure Notes (Addendum)
Anesthesia Regional Block:  Interscalene brachial plexus block  Pre-Anesthetic Checklist: ,, timeout performed, Correct Patient, Correct Site, Correct Laterality, Correct Procedure, Correct Position, site marked, Risks and benefits discussed,  Surgical consent,  Pre-op evaluation,  At surgeon's request and post-op pain management  Laterality: Upper and Left  Prep: chloraprep and alcohol swabs       Needles:  Injection technique: Single-shot  Needle Type: Echogenic Stimulator Needle     Needle Length: 9cm 9 cm Needle Gauge: 22 and 22 G  Needle insertion depth: 4 cm   Additional Needles:  Procedures: ultrasound guided (picture in chart) and nerve stimulator Interscalene brachial plexus block  Nerve Stimulator or Paresthesia:  Response: Twitch elicited, 0.5 mA, 0.3 ms,   Additional Responses:   Narrative:  Start time: 10/27/2014 9:30 AM End time: 10/27/2014 9:45 AM Injection made incrementally with aspirations every 5 mL.  Performed by: Personally  Anesthesiologist: MASSAGEE, TERRY  Additional Notes: Block assessed prior to start of surgery. Tolerated well   Procedure Name: Intubation Date/Time: 10/27/2014 10:01 AM Performed by: Patrcia Dolly, Kawena Lyday Pre-anesthesia Checklist: Patient identified, Patient being monitored, Timeout performed, Emergency Drugs available and Suction available Patient Re-evaluated:Patient Re-evaluated prior to inductionOxygen Delivery Method: Circle System Utilized Preoxygenation: Pre-oxygenation with 100% oxygen Intubation Type: IV induction Ventilation: Mask ventilation without difficulty Laryngoscope Size: Miller and 2 Grade View: Grade I Tube type: Oral Tube size: 7.0 mm Number of attempts: 1 Airway Equipment and Method: Stylet Placement Confirmation: ETT inserted through vocal cords under direct vision,  positive ETCO2 and breath sounds checked- equal and bilateral Secured at: 21 cm Tube secured with: Tape Dental Injury: Teeth and  Oropharynx as per pre-operative assessment

## 2014-10-27 NOTE — Interval H&P Note (Signed)
History and Physical Interval Note:  10/27/2014 7:21 AM  Meghan Ford  has presented today for surgery, with the diagnosis of LEFT HUMERUS FRACTURE NONUNION  The various methods of treatment have been discussed with the patient and family. After consideration of risks, benefits and other options for treatment, the patient has consented to  Procedure(s): TOTAL SHOULDER ARTHROPLASTY (Left) as a surgical intervention .  The patient's history has been reviewed, patient examined, no change in status, stable for surgery.  I have reviewed the patient's chart and labs.  Questions were answered to the patient's satisfaction.     [name]

## 2014-10-27 NOTE — Discharge Instructions (Signed)
INSTRUCTIONS AFTER JOINT REPLACEMENT   o Remove items at home which could result in a fall. This includes throw rugs or furniture in walking pathways o ICE to the affected joint every three hours while awake for 30 minutes at a time, for at least the first 3-5 days, and then as needed for pain and swelling.  Continue to use ice for pain and swelling. You may notice swelling that will progress down to the foot and ankle.  This is normal after surgery.  Elevate your leg when you are not up walking on it.   o Continue to use the breathing machine you got in the hospital (incentive spirometer) which will help keep your temperature down.  It is common for your temperature to cycle up and down following surgery, especially at night when you are not up moving around and exerting yourself.  The breathing machine keeps your lungs expanded and your temperature down.   DIET:  As you were doing prior to hospitalization, we recommend a well-balanced diet.  DRESSING / WOUND CARE / SHOWERING  Keep the surgical dressing until follow up.  The dressing is water proof, so you can shower without any extra covering.  IF THE DRESSING FALLS OFF or the wound gets wet inside, change the dressing with sterile gauze.  Please use good hand washing techniques before changing the dressing.  Do not use any lotions or creams on the incision until instructed by your surgeon.   WEIGHT BEARING   Other:  NWB and in the sling at all times   CONSTIPATION  Constipation is defined medically as fewer than three stools per week and severe constipation as less than one stool per week.  Even if you have a regular bowel pattern at home, your normal regimen is likely to be disrupted due to multiple reasons following surgery.  Combination of anesthesia, postoperative narcotics, change in appetite and fluid intake all can affect your bowels.   YOU MUST use at least one of the following options; they are listed in order of increasing strength  to get the job done.  They are all available over the counter, and you may need to use some, POSSIBLY even all of these options:    Drink plenty of fluids (prune juice may be helpful) and high fiber foods Colace 100 mg by mouth twice a day  Senokot for constipation as directed and as needed Dulcolax (bisacodyl), take with full glass of water  Miralax (polyethylene glycol) once or twice a day as needed.  If you have tried all these things and are unable to have a bowel movement in the first 3-4 days after surgery call either your surgeon or your primary doctor.    If you experience loose stools or diarrhea, hold the medications until you stool forms back up.  If your symptoms do not get better within 1 week or if they get worse, check with your doctor.  If you experience "the worst abdominal pain ever" or develop nausea or vomiting, please contact the office immediately for further recommendations for treatment.   ITCHING:  If you experience itching with your medications, try taking only a single pain pill, or even half a pain pill at a time.  You can also use Benadryl over the counter for itching or also to help with sleep.   TED HOSE STOCKINGS:  Use stockings on both legs until for at least 2 weeks or as directed by physician office. They may be removed at night for  sleeping.  MEDICATIONS:  See your medication summary on the After Visit Summary that nursing will review with you.  You may have some home medications which will be placed on hold until you complete the course of blood thinner medication.  It is important for you to complete the blood thinner medication as prescribed.  PRECAUTIONS:  If you experience chest pain or shortness of breath - call 911 immediately for transfer to the hospital emergency department.   If you develop a fever greater that 101 F, purulent drainage from wound, increased redness or drainage from wound, foul odor from the wound/dressing, or calf pain - CONTACT  YOUR SURGEON.                                                   FOLLOW-UP APPOINTMENTS:  If you do not already have a post-op appointment, please call the office for an appointment to be seen by your surgeon.  Guidelines for how soon to be seen are listed in your After Visit Summary, but are typically between 1-4 weeks after surgery.  MAKE SURE YOU:   Understand these instructions.   Get help right away if you are not doing well or get worse.    Thank you for letting us be a part of your medical care team.  It is a privilege we respect greatly.  We hope these instructions will help you stay on track for a fast and full recovery!

## 2014-10-28 ENCOUNTER — Inpatient Hospital Stay (HOSPITAL_COMMUNITY): Payer: Self-pay

## 2014-10-28 ENCOUNTER — Encounter (HOSPITAL_COMMUNITY): Payer: Self-pay | Admitting: Orthopedic Surgery

## 2014-10-28 DIAGNOSIS — E1140 Type 2 diabetes mellitus with diabetic neuropathy, unspecified: Secondary | ICD-10-CM

## 2014-10-28 LAB — CBC WITH DIFFERENTIAL/PLATELET
BASOS ABS: 0 10*3/uL (ref 0.0–0.1)
Basophils Relative: 0 % (ref 0–1)
Eosinophils Absolute: 0 10*3/uL (ref 0.0–0.7)
Eosinophils Relative: 0 % (ref 0–5)
HCT: 42 % (ref 36.0–46.0)
HEMOGLOBIN: 13.2 g/dL (ref 12.0–15.0)
LYMPHS ABS: 2.5 10*3/uL (ref 0.7–4.0)
LYMPHS PCT: 24 % (ref 12–46)
MCH: 25.2 pg — AB (ref 26.0–34.0)
MCHC: 31.4 g/dL (ref 30.0–36.0)
MCV: 80.3 fL (ref 78.0–100.0)
Monocytes Absolute: 0.6 10*3/uL (ref 0.1–1.0)
Monocytes Relative: 6 % (ref 3–12)
NEUTROS ABS: 7.1 10*3/uL (ref 1.7–7.7)
NEUTROS PCT: 70 % (ref 43–77)
Platelets: 199 10*3/uL (ref 150–400)
RBC: 5.23 MIL/uL — AB (ref 3.87–5.11)
RDW: 14.7 % (ref 11.5–15.5)
WBC: 10.3 10*3/uL (ref 4.0–10.5)

## 2014-10-28 LAB — BASIC METABOLIC PANEL
ANION GAP: 9 (ref 5–15)
BUN: 12 mg/dL (ref 6–20)
CHLORIDE: 101 mmol/L (ref 101–111)
CO2: 25 mmol/L (ref 22–32)
Calcium: 8.4 mg/dL — ABNORMAL LOW (ref 8.9–10.3)
Creatinine, Ser: 0.84 mg/dL (ref 0.44–1.00)
GFR calc Af Amer: >60 mL/min (ref >60)
GFR calc non Af Amer: >60 mL/min (ref >60)
GLUCOSE: 168 mg/dL — AB (ref 65–99)
POTASSIUM: 4.6 mmol/L (ref 3.5–5.1)
SODIUM: 135 mmol/L (ref 135–145)

## 2014-10-28 LAB — GLUCOSE, CAPILLARY
GLUCOSE-CAPILLARY: 144 mg/dL — AB (ref 65–99)
GLUCOSE-CAPILLARY: 165 mg/dL — AB (ref 65–99)
Glucose-Capillary: 167 mg/dL — ABNORMAL HIGH (ref 65–99)

## 2014-10-28 NOTE — Evaluation (Signed)
Physical Therapy Evaluation Patient Details Name: Meghan Ford MRN: [id number] DOB: September 02, 1940 Today's Date: 10/28/2014   History of Present Illness  REVERSE SHOULDER ARTHROPLASTY LT   Clinical Impression  Patient able to demonstrate ability to transfer out of bed with moderate assistance and perform sit/stand transfers with minimal assist/mimimal guard. Patient ambulated 40 feet without an assistive device and without loss of balance. Slow pattern with mild instability but again, no loss of balance. Recommended to patient and family, assistance when out of bed or chair. Patient refused trial of ambulation with cane for additional gait stability. Although the patient will be going to a home with 4 stairs, both the patient and family decline attempting stairs before going home. It is reported that the patient feels confident with stairs and does not want to attempt. Following the session, both the patient and family deny questions or concerns regarding D/C home.     Follow Up Recommendations Home health PT    Equipment Recommendations  Other (comment) (Patient refused attempting use of equipment)    Recommendations for Other Services       Precautions / Restrictions Precautions Precautions: Shoulder Shoulder Interventions: Shoulder sling/immobilizer;At all times;Off for dressing/bathing/exercises Restrictions Weight Bearing Restrictions: Yes LUE Weight Bearing: Non weight bearing      Mobility  Bed Mobility Overal bed mobility: Needs Assistance Bed Mobility: Supine to Sit     Supine to sit: Mod assist     General bed mobility comments: mod assist with trunk when transferring supine to sit.  Transfers Overall transfer level: Needs assistance Equipment used: 1 person hand held assist Transfers: Sit to/from Stand Sit to Stand: Min guard;Min assist         General transfer comment: Min assist - min guard with transfers from bed and toilet.    Ambulation/Gait Ambulation/Gait assistance: Min guard Ambulation Distance (Feet): 45 Feet Assistive device: None Gait Pattern/deviations: Decreased step length - right;Decreased step length - left Gait velocity: decreased   General Gait Details: small strides with increased lateral weight  shifts bilaterally. No loss of balance. Patient declined trial of [facility] for additional stability.   Stairs Stairs:  (Patient and family refused attempting. Report confidence.)          Wheelchair Mobility    Modified Rankin (Stroke Patients Only)       Balance Overall balance assessment: Needs assistance Sitting-balance support: No upper extremity supported Sitting balance-Leahy Scale: Fair     Standing balance support: No upper extremity supported Standing balance-Leahy Scale: Fair                               Pertinent Vitals/Pain Pain Assessment: Faces Faces Pain Scale: Hurts even more Pain Location: Lt shoulder Pain Descriptors / Indicators: Guarding Pain Intervention(s): Monitored during session;Limited activity within patient's tolerance    Home Living Family/patient expects to be discharged to:: Private residence Living Arrangements: Alone Available Help at Discharge: Family;Available 24 hours/day Type of Home: House Home Access: Stairs to enter Entrance Stairs-Rails: Right;Left;Can reach both Entrance Stairs-Number of Steps: 4 Home Layout: Two level;Able to live on main level with bedroom/bathroom;Other (Comment);1/2 bath on main level Home Equipment: None Additional Comments: Will be staying at daughters house with family support    Prior Function Level of Independence: Independent               Hand Dominance        Extremity/Trunk Assessment  Lower Extremity Assessment: Overall WFL for tasks assessed         Communication   Communication: Prefers language other than [place];Other (comment)  Cognition  Arousal/Alertness: Lethargic Behavior During Therapy: WFL for tasks assessed/performed Overall Cognitive Status: Within Functional Limits for tasks assessed                      General Comments General comments (skin integrity, edema, etc.): Patient's son present throughout session.     Exercises        Assessment/Plan    PT Assessment Patient needs continued PT services  PT Diagnosis Difficulty walking   PT Problem List Decreased activity tolerance;Decreased balance;Decreased mobility  PT Treatment Interventions DME instruction;Gait training;Stair training;Functional mobility training;Therapeutic activities;Therapeutic exercise;Balance training;Patient/family education   PT Goals (Current goals can be found in the Care Plan section) Acute Rehab PT Goals Patient Stated Goal: not stated    Frequency Min 3X/week   Barriers to discharge        Co-evaluation               End of Session Equipment Utilized During Treatment: Gait belt;Other (comment) (L shoulder sling) Activity Tolerance: Patient tolerated treatment well;No increased pain Patient left: in chair;with call bell/phone within reach;with nursing/sitter in room;with family/visitor present Nurse Communication: Mobility status         Time: 1420-1444 PT Time Calculation (min) (ACUTE ONLY): 24 min   Charges:   PT Evaluation $Initial PT Evaluation Tier I: 1 Procedure PT Treatments $Gait Training: 8-22 mins   PT G Codes:        Christiane Ha, PT, CSCS Pager [phone] Office [phone]  10/28/2014, 3:11 PM

## 2014-10-28 NOTE — Progress Notes (Signed)
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 Eulah Pont orthopedics 10/27/2014. Per primary.  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  -Lantus increased to 10 units daily Continue sliding scale insulin. Continue Neurontin. -Encouraged patient to follow up at [facility] -Discussed with patient and daughter to check CBGs with each meal and at bedtime and to keep her glycemic load with which to take to primary care provider  #3 hypertension Patient has not received antihypertensives medications yet today. Continue home dose lisinopril. Follow.  #4 leukocytosis -Secondary to stress demargination -Improved without antibiotics -Urinalysis negative -10/27/2014 chest x-ray--LLL  Opacity--clinically does not have pneumonia--afebrile hemodynamically stable without leukocytosis or hypoxemia or shortness of breath -Incentive spirometry #5 hyperlipidemia Continue statin.  #6 prophylaxis DVT prophylaxis per primary team.  Code Status: Full Family Communication: Updated daughter at bedside. Disposition Plan: Per primary team.         Procedures/Studies: Ct Shoulder Left W Contrast  09/29/2014   CLINICAL DATA:  Left shoulder pain. Chronic proximal left humerus fracture. Fever and chills.  EXAM: CT OF THE LEFT SHOULDER WITH CONTRAST  TECHNIQUE: Multidetector CT imaging was performed following the standard protocol during bolus administration of intravenous contrast.  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 The remnants of the humeral head are rotated and displaced with  overriding of the proximal shaft extending anterior to the rotated humeral head. The fracture has not healed.  However, there are no findings to suggest osteomyelitis. There is increased soft tissue density in the glenohumeral joint which is most likely synovial hypertrophy. The muscles of the rotator cuff demonstrate no atrophy. There is only slight arthropathy of the acromioclavicular joint. No adenopathy. Subcortical cyst formation at the inferior aspect of the glenoid is felt to be degenerative in origin.  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 Arthritic changes of the glenoid and AC joint.   Electronically Signed   By: Francene Boyers M.D.   On: 09/29/2014 15:41   Dg Chest Port 1 View  10/27/2014   CLINICAL DATA:  Elevated white blood count, postoperative left shoulder surgery  EXAM: PORTABLE CHEST - 1 VIEW  COMPARISON:  02/09/2010  FINDINGS: Cardiac enlargement likely of mild severity given exaggeration by AP technique. Stable calcification of the aorta. Vascular pattern normal. Mild atelectasis right lung base. More extensive retrocardiac left lower lobe opacity not fully characterized on single AP view.  Postoperative change left shoulder.  IMPRESSION: Left lower lobe consolidation. Given clinical indication of leukocytosis, findings are concerning for pneumonia.   Electronically Signed   By: Esperanza Heir M.D.   On: 10/27/2014 22:16   Dg Shoulder Left  10/28/2014   CLINICAL DATA:  Left shoulder pain and soreness, prior arthroplasty.  EXAM: LEFT SHOULDER - 2+ VIEW  COMPARISON:  10/27/2014  FINDINGS: Left reverse shoulder arthroplasty observed. Thinned meta diaphyseal cortex lateral to the proximal stem, with mild bony irregularity in this vicinity. Residual bony density in the greater tuberosity region, although with indistinct distal lucency. A well-defined fracture is not seen,  although sensitivity is reduced due to  the bony demineralization.  Scapular portion appears unremarkable.  Subsegmental atelectasis at the left lung base. Atherosclerotic aortic arch.  IMPRESSION: 1. Cortical thinning and scalloping lateral to the proximal stem of the humeral component, a somewhat unusual pattern raising the possibility of a cortical expansile lesion or severe localized bony demineralization. This is not the typical appearance for a fracture but conceivably could be a secondary sign of an underlying subtle fracture. If the patient had preprocedural imaging that would be helpful for comparison. 2. Bony demineralization. 3. Subsegmental atelectasis, left lung base. 4. Atherosclerotic aortic arch.   Electronically Signed   By: Gaylyn Rong M.D.   On: 10/28/2014 10:00   Dg Shoulder Left Port  10/27/2014   CLINICAL DATA:  Status post reverse left shoulder arthroplasty  EXAM: LEFT SHOULDER - 1 VIEW  COMPARISON:  None in PACs  FINDINGS: The patient has undergone reverse left shoulder arthroplasty. Radiographic position of the prosthetic components is good. The native bones are osteopenic.  IMPRESSION: The patient has undergone a left reversed shoulder arthroplasty. No postprocedure complication is observed.   Electronically Signed   By: Janeliz Prestwood  [place] M.D.   On: 10/27/2014 13:18         Subjective: Patient complains of left shoulder pain. Otherwise denies any chest pain, short of breath, nausea, vomiting, diarrhea, abdominal pain.   Objective: Filed Vitals:   10/27/14 1329 10/27/14 2057 10/28/14 0024 10/28/14 0450  BP: 151/61 154/60 139/60 138/61  Pulse: 66 83 89 79  Temp: 97.6 F (36.4 C) 99.6 F (37.6 C) 99 F (37.2 C) 98.3 F (36.8 C)  TempSrc: Oral Oral Oral Axillary  Resp: 16 16 16 16   SpO2: 98% 100% 94% 91%    Intake/Output Summary (Last 24 hours) at 10/28/14 1857 Last data filed at 10/28/14 0700  Gross per 24 hour  Intake   1120 ml  Output      0 ml  Net   1120  ml   Weight change:  Exam:   General:  Pt is alert, follows commands appropriately, not in acute distress  HEENT: No icterus, No thrush, [place]/AT  Cardiovascular: RRR, S1/S2, no rubs, no gallops  Respiratory: bibasilar crackles without wheezing. Good air movement.   Abdomen: Soft/+BS, non tender, non distended, no guarding  Extremities: No edema, No lymphangitis, No petechiae, No rashes, no synovitis  Data Reviewed: Basic Metabolic Panel:  Recent Labs Lab 10/22/14 1009 10/26/14 1109 10/27/14 1645 10/28/14 0617  NA 143 139 136 135  K 4.8 4.3 4.1 4.6  CL 104 100* 101 101  CO2 30 29 20* 25  GLUCOSE 141* 219* 202* 168*  BUN 16 12 12 12   CREATININE 0.77 0.68 0.76 0.84  CALCIUM 9.2 9.5 8.6* 8.4*   Liver Function Tests:  Recent Labs Lab 10/22/14 1009  AST 15  ALT 11  ALKPHOS 75  BILITOT 0.7  PROT 6.8  ALBUMIN 3.9   No results for input(s): LIPASE, AMYLASE in the last 168 hours. No results for input(s): AMMONIA in the last 168 hours. CBC:  Recent Labs Lab 10/26/14 1109 10/27/14 1645 10/28/14 0617  WBC 9.5 17.0* 10.3  NEUTROABS  --   --  7.1  HGB 14.9 13.8 13.2  HCT 47.3* 42.6 42.0  MCV 81.3 79.9 80.3  PLT 226 170 199   Cardiac Enzymes: No results for input(s): CKTOTAL, CKMB, CKMBINDEX, TROPONINI in the last 168 hours. BNP: Invalid input(s): POCBNP CBG:  Recent Labs Lab 10/27/14 1639 10/27/14 2059 10/28/14 0619 10/28/14 1118 10/28/14  1631  GLUCAP 219* 178* 167* 144* 165*    Recent Results (from the past 240 hour(s))  Surgical pcr screen     Status: None   Collection Time: 10/27/14  8:23 AM  Result Value Ref Range Status   MRSA, PCR NEGATIVE NEGATIVE Final   Staphylococcus aureus NEGATIVE NEGATIVE Final    Comment:        The Xpert SA Assay (FDA approved for NASAL specimens in patients over 14 years of age), is one component of a comprehensive surveillance program.  Test performance has been validated by [facility] for patients  greater than or equal to 47 year old. It is not intended to diagnose infection nor to guide or monitor treatment.      Scheduled Meds: . amLODipine  5 mg Oral Daily  . atorvastatin  40 mg Oral q1800  . celecoxib  200 mg Oral Q12H  . docusate sodium  100 mg Oral BID  . gabapentin  300 mg Oral BID  . insulin aspart  0-9 Units Subcutaneous TID WC  . insulin glargine  10 Units Subcutaneous QHS  . lisinopril  40 mg Oral Daily   Continuous Infusions: . sodium chloride 75 mL/hr at 10/28/14 0555     Nohemy Koop, DO  [facility] Pager [phone]  If 7PM-7AM, please contact night-coverage [email or web address] Password [facility] 10/28/2014, 6:57 PM   LOS: 2 days

## 2014-10-28 NOTE — Care Management Note (Signed)
Case Management Note  Patient Details  Name: Meghan Ford MRN: [id number] Date of Birth: [birth year]  Subjective/Objective:               S/p left reverse total shoulder arthroplasty      Action/Plan: Spoke with patient and her granddaughter, informed them that I would not be able to set up home therapy. Granddaughter stated that family would be available to assist patient. Contacted James with [facility], 3N1 delivered to patient's room. Gave granddaughter a pharmacy discount card. Contacted Lindwood Qua PA and informed her I was unable to set up home therapy due to lack of insurance coverage.   Expected Discharge Date:                  Expected Discharge Plan:  Home/Self Care  In-House Referral:  Financial Counselor  Discharge planning Services  CM Consult  Post Acute Care Choice:  Durable Medical Equipment Choice offered to:  NA  DME Arranged:  3-N-1 DME Agency:  [facility]  HH Arranged:    HH Agency:     Status of Service:  Completed, signed off  Medicare Important Message Given:    Date Medicare IM Given:    Medicare IM give by:    Date Additional Medicare IM Given:    Additional Medicare Important Message give by:     If discussed at Long Length of Stay Meetings, dates discussed:    Additional Comments:  Monica Becton, RN 10/28/2014, 3:19 PM

## 2014-10-28 NOTE — Discharge Summary (Signed)
Physician Discharge Summary  Patient ID: Meghan Ford MRN: [id number] DOB/AGE: [birth year] 74 y.o.  Admit date: 10/26/2014 Discharge date: 10/28/2014  Admission Diagnoses:  Proximal humerus fracture  Discharge Diagnoses:  Principal Problem:   Proximal humerus fracture Active Problems:   Diabetes mellitus type II, uncontrolled   Uncontrolled hypertension   Diabetes mellitus with neuropathy   S/p reverse total shoulder arthroplasty   Leukocytosis   Past Medical History  Diagnosis Date  . Diabetes mellitus type II, uncontrolled   . Hypertension   . Peripheral vascular disease   . Leg pain     worse with prolonged standing  . Varicose veins   . Hyperlipidemia LDL goal < 100   . Diabetes mellitus without complication   . Fx humeral neck     Surgeries: Procedure(s): TOTAL SHOULDER ARTHROPLASTY REVERSE SHOULDER ARTHROPLASTY on 10/26/2014 - 10/27/2014   Consultants (if any): Treatment Team:  Mahala Menghini, MD  Discharged Condition: Improved  Hospital Course: Meghan Ford is an 74 y.o. female who was admitted 10/26/2014 with a diagnosis of Proximal humerus fracture and went to the operating room on 10/26/2014 - 10/27/2014 and underwent the above named procedures.    She was given perioperative antibiotics:  Anti-infectives    Start     Dose/Rate Route Frequency Ordered Stop   10/27/14 1345  ceFAZolin (ANCEF) IVPB 2 g/50 mL premix     2 g 100 mL/hr over 30 Minutes Intravenous Every 6 hours 10/27/14 1332 10/28/14 0256   10/27/14 0800  ceFAZolin (ANCEF) IVPB 2 g/50 mL premix     2 g 100 mL/hr over 30 Minutes Intravenous To [facility] 10/26/14 1046 10/27/14 1015    .  She was given sequential compression devices, early ambulation, and ASA  for DVT prophylaxis.  She benefited maximally from the hospital stay and there were no complications.    Recent vital signs:  Filed Vitals:   10/28/14 0450  BP: 138/61  Pulse: 79  Temp: 98.3 F  (36.8 C)  Resp: 16    Recent laboratory studies:  Lab Results  Component Value Date   HGB 13.2 10/28/2014   HGB 13.8 10/27/2014   HGB 14.9 10/26/2014   Lab Results  Component Value Date   WBC 10.3 10/28/2014   PLT 199 10/28/2014   Lab Results  Component Value Date   INR 1.05 10/27/2014   Lab Results  Component Value Date   NA 135 10/28/2014   K 4.6 10/28/2014   CL 101 10/28/2014   CO2 25 10/28/2014   BUN 12 10/28/2014   CREATININE 0.84 10/28/2014   GLUCOSE 168* 10/28/2014    Discharge Medications:     Medication List    STOP taking these medications        acetaminophen 500 MG tablet  Commonly known as:  TYLENOL     ciprofloxacin 500 MG tablet  Commonly known as:  CIPRO     HYDROcodone-acetaminophen 5-325 MG per tablet  Commonly known as:  NORCO/VICODIN     traMADol 50 MG tablet  Commonly known as:  ULTRAM      TAKE these medications        acyclovir 400 MG tablet  Commonly known as:  ZOVIRAX  Take 2 tablets (800 mg total) by mouth 5 (five) times daily.     aspirin 325 MG tablet  Take 1 tablet (325 mg total) by mouth daily.     atorvastatin 40 MG tablet  Commonly known as:  LIPITOR  Take 1 tablet (40  mg total) by mouth daily.     docusate sodium 100 MG capsule  Commonly known as:  COLACE  Take 1 capsule (100 mg total) by mouth 2 (two) times daily.     gabapentin 300 MG capsule  Commonly known as:  NEURONTIN  Take 1 capsule (300 mg total) by mouth 2 (two) times daily.     glucose blood test strip  Use as instructed     ibuprofen 600 MG tablet  Commonly known as:  ADVIL,MOTRIN  Take 1 tablet (600 mg total) by mouth every 6 (six) hours as needed for pain (por dolor en espalda.  Toma con comida.).     insulin aspart 100 unit/ml Soln  Commonly known as:  novoLOG  Inject 12 Units into the skin 2 (two) times daily.     insulin glargine 100 UNIT/ML injection  Commonly known as:  LANTUS  Inject 0.08 mLs (8 Units total) into the skin at  bedtime.     lisinopril 40 MG tablet  Commonly known as:  PRINIVIL,ZESTRIL  Take 1 tablet (40 mg total) by mouth daily.     ondansetron 4 MG tablet  Commonly known as:  ZOFRAN  Take 1 tablet (4 mg total) by mouth every 8 (eight) hours as needed for nausea or vomiting.     oxyCODONE-acetaminophen 5-325 MG per tablet  Commonly known as:  PERCOCET  Take 1-2 tablets by mouth every 4 (four) hours as needed for severe pain.        Diagnostic Studies: Ct Shoulder Left W Contrast  09/29/2014   CLINICAL DATA:  Left shoulder pain. Chronic proximal left humerus fracture. Fever and chills.  EXAM: CT OF THE LEFT SHOULDER WITH CONTRAST  TECHNIQUE: Multidetector CT imaging was performed following the standard protocol during bolus administration of intravenous contrast.  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 The remnants of the humeral head are rotated and displaced with overriding of the proximal shaft extending anterior to the rotated humeral head. The fracture has not healed.  However, there are no findings to suggest osteomyelitis. There is increased soft tissue density in the glenohumeral joint which is most likely synovial hypertrophy. The muscles of the rotator cuff demonstrate no atrophy. There is only slight arthropathy of the acromioclavicular joint. No adenopathy. Subcortical cyst formation at the inferior aspect of the glenoid is felt to be degenerative in origin.  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 Arthritic changes of the glenoid and AC joint.   Electronically Signed   By: Francene Boyers M.D.   On: 09/29/2014 15:41   Dg Chest Port 1 View  10/27/2014   CLINICAL DATA:  Elevated white blood count,  postoperative left shoulder surgery  EXAM: PORTABLE CHEST - 1 VIEW  COMPARISON:  02/09/2010  FINDINGS: Cardiac enlargement likely of mild severity given exaggeration by AP technique. Stable calcification of the aorta. Vascular pattern normal. Mild atelectasis right lung base. More extensive retrocardiac left lower lobe opacity not fully characterized on single AP view.  Postoperative change left shoulder.  IMPRESSION: Left lower lobe consolidation. Given clinical indication of leukocytosis, findings are concerning for pneumonia.   Electronically Signed   By: Esperanza Heir M.D.   On: 10/27/2014 22:16   Dg Shoulder Left Port  10/27/2014  CLINICAL DATA:  Status post reverse left shoulder arthroplasty  EXAM: LEFT SHOULDER - 1 VIEW  COMPARISON:  None in PACs  FINDINGS: The patient has undergone reverse left shoulder arthroplasty. Radiographic position of the prosthetic components is good. The native bones are osteopenic.  IMPRESSION: The patient has undergone a left reversed shoulder arthroplasty. No postprocedure complication is observed.   Electronically Signed   By: David  [place] M.D.   On: 10/27/2014 13:18    Disposition: 01-Home or Self Care      Discharge Instructions    Non weight bearing    Complete by:  As directed   Laterality:  left  Extremity:  Upper           Follow-up Information    Follow up with MURPHY, TIMOTHY D, MD In 1 week.   Specialty:  Orthopedic Surgery   Contact information:   [address] [place] [address] [phone]        Signed: Lynann Bologna 10/28/2014, 9:42 AM Cell [phone]

## 2014-10-28 NOTE — Progress Notes (Signed)
Occupational Therapy Evaluation Patient Details Name: Meghan Ford MRN: [id number] DOB: 03/19/1940 Today's Date: 10/28/2014    History of Present Illness Pt is a 74 y.o. female with PMH of diabetes and hypertension who is now s/p Lt TSA.    Clinical Impression   Pt admitted with the above diagnoses and presents with below problem list. Pt will benefit from continued acute OT to address the below listed deficits and maximize independence with BADLs prior to d/c to venue below. PTA pt was independent with ADLs. Pt is currently mod A for most ADLs. Of note, pt lethargic, reporting high pain in LUE, and experiencing dizziness and nausea with emesis impacting her level of assist and balance upon evaluation. OT to continue to follow acutely and recommend HHOT at d/c.      Follow Up Recommendations  Home health OT;Supervision/Assistance - 24 hour    Equipment Recommendations  3 in 1 bedside comode    Recommendations for Other Services PT consult     Precautions / Restrictions Precautions Precautions: Shoulder Type of Shoulder Precautions: Conservative; No ROM of Lt shoulder, NWB Shoulder Interventions: Shoulder sling/immobilizer;At all times;Off for dressing/bathing/exercises Precaution Booklet Issued: Yes (comment) Precaution Comments: reviewed with grandaughter and pt Required Braces or Orthoses: Sling Restrictions Weight Bearing Restrictions: Yes LUE Weight Bearing: Non weight bearing      Mobility Bed Mobility Overal bed mobility: Needs Assistance Bed Mobility: Supine to Sit;Sit to Supine     Supine to sit: Mod assist;HOB elevated Sit to supine: Mod assist   General bed mobility comments: Mod assist to powerup trunk to EOB moving towards pt's right side. Mod EOB>supine to control descent.   Transfers Overall transfer level: Needs assistance Equipment used: 1 person hand held assist Transfers: Sit to/from Stand Sit to Stand: Min assist;From elevated  surface         General transfer comment: Min A for powerup and balance.    Balance Overall balance assessment: Needs assistance Sitting-balance support: Single extremity supported;Feet supported Sitting balance-Leahy Scale: Fair Sitting balance - Comments: single extremity support to maintain balance; pt c/o dizziness in sitting position.    Standing balance support: Single extremity supported;During functional activity Standing balance-Leahy Scale: Poor Standing balance comment: 1 person HHA                            ADL Overall ADL's : Needs assistance/impaired Eating/Feeding: Minimal assistance;Sitting   Grooming: Minimal assistance;Sitting   Upper Body Bathing: Sitting;Moderate assistance;Cueing for compensatory techniques   Lower Body Bathing: Moderate assistance;Sit to/from stand   Upper Body Dressing : Moderate assistance;Sitting;Cueing for compensatory techniques   Lower Body Dressing: Sit to/from stand;Moderate assistance   Toilet Transfer: Minimal assistance;Ambulation;Comfort height toilet;Grab bars Toilet Transfer Details (indicate cue type and reason): 1 person HHA Toileting- Clothing Manipulation and Hygiene: Sitting/lateral lean;Minimal assistance;Sit to/from stand;Min guard       Functional mobility during ADLs: Minimal assistance General ADL Comments: Pt limited during evaluation by pain, lethargy, and nausea with some emesis. Pt complted bed mobility as detailed below. Pt with increased dizziness/nausea EOB. Educated pt and grandaughter on conservative shoulder protocal and provided handout. Pt ambulated to bathroom and completed toileting as detailed above. Pt needing external support during ambualtion.      Vision     Perception     Praxis      Pertinent Vitals/Pain Pain Assessment: Faces Faces Pain Scale: Hurts whole lot Pain Location: Lt shoulder; "mucho" Pain Descriptors /  Indicators: Grimacing;Constant;Moaning;Operative site  guarding Pain Intervention(s): Limited activity within patient's tolerance;Monitored during session;Repositioned;Ice applied;Utilized relaxation techniques     Hand Dominance Right   Extremity/Trunk Assessment Upper Extremity Assessment Upper Extremity Assessment: LUE deficits/detail LUE Deficits / Details: s/p Lt TSA LUE: Unable to fully assess due to pain;Unable to fully assess due to immobilization   Lower Extremity Assessment Lower Extremity Assessment: Defer to PT evaluation       Communication Communication Communication: Prefers language other than [place];Other (comment) (granddaughter interpretted)   Cognition Arousal/Alertness: Lethargic;Suspect due to medications Behavior During Therapy: [facility] for tasks assessed/performed Overall Cognitive Status: Within Functional Limits for tasks assessed                     General Comments       Exercises Exercises: Other exercises Other Exercises Other Exercises: Elbow, wrist, and hand exercises 2x each sitting EOB with cueing provided. Limited by pain, lethargy, and nausea.   Shoulder Instructions      Home Living Family/patient expects to be discharged to:: Private residence Living Arrangements: Alone Available Help at Discharge: Family;Available 24 hours/day Type of Home: House Home Access: Stairs to enter [employer] of Steps: 4 Entrance Stairs-Rails: Right;Left;Can reach both Home Layout: Two level;Able to live on main level with bedroom/bathroom;Other (Comment);1/2 bath on main level Alternate Level Stairs-Number of Steps: 1 step to enter house after porch steps   Bathroom Shower/Tub: Producer, television/film/video: Standard     Home Equipment: Shower seat   Additional Comments: Pt lives with friends but will be staying with daughter and granddaughter at d/c with 24 hr assist.      Prior Functioning/Environment Level of Independence: Independent             OT Diagnosis: Acute  pain   OT Problem List: Decreased range of motion;Impaired balance (sitting and/or standing);Decreased knowledge of use of DME or AE;Decreased knowledge of precautions;Impaired UE functional use;Pain   OT Treatment/Interventions: Self-care/ADL training;Therapeutic exercise;DME and/or AE instruction;Therapeutic activities;Patient/family education;Balance training    OT Goals(Current goals can be found in the care plan section) Acute Rehab OT Goals Patient Stated Goal: not stated OT Goal Formulation: With patient/family Time For Goal Achievement: 11/04/14 Potential to Achieve Goals: Good ADL Goals Pt Will Perform Grooming: with modified independence;sitting;standing;with adaptive equipment Pt Will Perform Upper Body Bathing: with modified independence;with adaptive equipment;sitting Pt Will Perform Lower Body Bathing: with supervision;with adaptive equipment;sit to/from stand;sitting/lateral leans Pt Will Perform Upper Body Dressing: with modified independence;sitting;with adaptive equipment Pt Will Perform Lower Body Dressing: with adaptive equipment;sit to/from stand;with supervision Pt Will Transfer to Toilet: with modified independence;ambulating;bedside commode Pt Will Perform Toileting - Clothing Manipulation and hygiene: with modified independence;with adaptive equipment;sitting/lateral leans;sit to/from stand Pt/caregiver will Perform Home Exercise Program: With written HEP provided;Independently (AROM Lt elbow/wrist/hand only) Additional ADL Goal #1: Pt will complete bed mobility at mod I level to prepare for OOB ADLs.   OT Frequency: Min 3X/week   Barriers to D/C:            Co-evaluation              End of Session Equipment Utilized During Treatment: Gait belt;Other (comment) (sling) Nurse Communication: Other (comment) (nausea, lethargy, pain)  Activity Tolerance: Patient limited by lethargy;Patient limited by pain;Other (comment) (nausea with minimal  emesis) Patient left: in bed;with call bell/phone within reach;with family/visitor present   Time: 1610-9604 OT Time Calculation (min): 39 min Charges:  OT General Charges $OT Visit:  1 Procedure OT Evaluation $Initial OT Evaluation Tier I: 1 Procedure OT Treatments $Self Care/Home Management : 8-22 mins G-Codes:    Pilar Grammes 11/19/14, 10:31 AM

## 2014-10-28 NOTE — Progress Notes (Signed)
     Subjective:  POD#1 L Reverse total shoulder arthroplasty. Patient reports pain as mild to moderate.  Resting comfortably in the bed this morning.  Family is at the bedside.   Objective:   VITALS:   Filed Vitals:   10/27/14 1329 10/27/14 2057 10/28/14 0024 10/28/14 0450  BP: 151/61 154/60 139/60 138/61  Pulse: 66 83 89 79  Temp: 97.6 F (36.4 C) 99.6 F (37.6 C) 99 F (37.2 C) 98.3 F (36.8 C)  TempSrc: Oral Oral Oral Axillary  Resp: SpO2: 98% 100% 94% 91%    Neurologically intact ABD soft Neurovascular intact Sensation intact distally Intact pulses distally Incision: dressing C/D/I L arm in the sling  Lab Results  Component Value Date   WBC 10.3 10/28/2014   HGB 13.2 10/28/2014   HCT 42.0 10/28/2014   MCV 80.3 10/28/2014   PLT 199 10/28/2014   BMET    Component Value Date/Time   NA 135 10/28/2014 0617   K 4.6 10/28/2014 0617   CL 101 10/28/2014 0617   CO2 25 10/28/2014 0617   GLUCOSE 168* 10/28/2014 0617   BUN 12 10/28/2014 0617   CREATININE 0.84 10/28/2014 0617   CREATININE 0.77 10/22/2014 1009   CALCIUM 8.4* 10/28/2014 0617   GFRNONAA >60 10/28/2014 0617   GFRNONAA 77 10/15/2012 1136   GFRAA >60 10/28/2014 0617   GFRAA 89 10/15/2012 1136     Assessment/Plan: 1 Day Post-Op   Principal Problem:   Proximal humerus fracture Active Problems:   Diabetes mellitus type II, uncontrolled   Uncontrolled hypertension   Diabetes mellitus with neuropathy   S/p reverse total shoulder arthroplasty   Leukocytosis   Up with therapy NWB in the LUE and in the sling, ok to remove to work ROM of elbow and hand and for bathing/dressing ASA  daily for DVT prophylaxis As long as work with PT goes well, will plan to discharge home with home health today.     Meghan Ford Meghan Ford 10/28/2014, 9:37 AM Cell [phone]

## 2014-10-28 NOTE — Progress Notes (Signed)
Informed Dr. Wandra Feinstein of radiology report reading bullet one r/t "cortical thinning...Marland Kitchen"

## 2014-10-29 ENCOUNTER — Telehealth: Payer: Self-pay | Admitting: *Deleted

## 2014-10-29 LAB — URINE CULTURE: Culture: NO GROWTH

## 2014-10-29 NOTE — Telephone Encounter (Signed)
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

## 2014-10-29 NOTE — Progress Notes (Signed)
D/C teaching completed with patient's daughter - was given english and spanish versions of D/C printout. Reviewed d/c information, management of dressings, sling, Rx's and additional information - all questions answered. Rx's given. Was assisted to [employer] exit by [employer] - patient in w/c with Daughter and Granddaughter assisting with communication and personal belongings.

## 2014-10-29 NOTE — Telephone Encounter (Signed)
[employer] interpreter [phone] made phone call to patient and verified name and date of birth.  Patient given results that her LDL is mildly elevated and the MD wants her to begin taking Lipitor.  She may pick it up at our pharmacy.  Also informed her that she has microalbuminuria and she is to remain on her Lisinopril which should help with that.  We discussed eating foods such as lean meats, green vegetables and fruits in moderation because she is diabetic. We talked about avoiding fried/greasy/fatty foods and processed foods like chips, cookies, packaged goods.  We also talked about trying to walk at least 3 times a week to start slow and build up as she gets used to doing it.  Verified her next appointment with [facility] on 11/19/14 at 1600.  Patient stated she would be at the appointment.

## 2014-11-19 ENCOUNTER — Ambulatory Visit: Payer: Self-pay | Attending: Internal Medicine | Admitting: Internal Medicine

## 2014-11-19 ENCOUNTER — Encounter: Payer: Self-pay | Admitting: Internal Medicine

## 2014-11-19 VITALS — BP 126/88 | HR 58 | Temp 98.5°F | Resp 16 | Ht 64.0 in | Wt 198.6 lb

## 2014-11-19 DIAGNOSIS — Z794 Long term (current) use of insulin: Secondary | ICD-10-CM | POA: Insufficient documentation

## 2014-11-19 DIAGNOSIS — I10 Essential (primary) hypertension: Secondary | ICD-10-CM | POA: Insufficient documentation

## 2014-11-19 DIAGNOSIS — E119 Type 2 diabetes mellitus without complications: Secondary | ICD-10-CM

## 2014-11-19 DIAGNOSIS — E1142 Type 2 diabetes mellitus with diabetic polyneuropathy: Secondary | ICD-10-CM | POA: Insufficient documentation

## 2014-11-19 DIAGNOSIS — E785 Hyperlipidemia, unspecified: Secondary | ICD-10-CM | POA: Insufficient documentation

## 2014-11-19 LAB — GLUCOSE, POCT (MANUAL RESULT ENTRY): POC Glucose: 168 mg/dl — AB (ref 70–99)

## 2014-11-19 MED ORDER — GABAPENTIN 300 MG PO CAPS: 300.0000 mg | ORAL_CAPSULE | Freq: Two times a day (BID) | ORAL | Status: DC

## 2014-11-19 MED ORDER — ATORVASTATIN CALCIUM 40 MG PO TABS: 40.0000 mg | ORAL_TABLET | Freq: Every day | ORAL | Status: DC

## 2014-11-19 MED ORDER — LISINOPRIL 40 MG PO TABS: 40.0000 mg | ORAL_TABLET | Freq: Every day | ORAL | Status: DC

## 2014-11-19 NOTE — Progress Notes (Signed)
Visual interpreter used Meghan Ford ID# [id number] Patient states she is here  for follow up on her diabetes and hypertension Patient is confused about her blood pressure medication Patient repeatedly stated she is taking  of some medication for her blood pressure Stated it was increased from 250 mg Patient does not have her medications with her or a current list Verified her medications with the pharmacy here since this is where she gets them filled

## 2014-11-19 NOTE — Progress Notes (Signed)
Patient ID: Meghan Ford, female   DOB: 1940/11/28, 74 y.o.   MRN: [id number]  CC: Follow up   HPI: Meghan Ford is a 74 y.o. female here today for a follow up visit.  Patient has past medical history of diabetes and HTN. Patient reports that she takes her blood pressure medication daily without complications. She denies symptoms of headaches, chest pain, SOB, edema, or palpitations. Patient did not bring her medications or a list of medication but states that she needs refills of a blood pressure medication that is 500 mg.   Patient has No headache, No chest pain, No abdominal pain - No Nausea, No new weakness tingling or numbness, No Cough - SOB.  No Known Allergies Past Medical History  Diagnosis Date  . Diabetes mellitus type II, uncontrolled   . Hypertension   . Peripheral vascular disease   . Leg pain     worse with prolonged standing  . Varicose veins   . Hyperlipidemia LDL goal < 100   . Diabetes mellitus without complication   . Fx humeral neck    Current Outpatient Prescriptions on File Prior to Visit  Medication Sig Dispense Refill  . aspirin 325 MG tablet Take 1 tablet (325 mg total) by mouth daily. 30 tablet 0  . atorvastatin (LIPITOR) 40 MG tablet Take 1 tablet (40 mg total) by mouth daily. 30 tablet 2  . gabapentin (NEURONTIN) 300 MG capsule Take 1 capsule (300 mg total) by mouth 2 (two) times daily. 60 capsule 2  . ibuprofen (ADVIL,MOTRIN) 600 MG tablet Take 1 tablet (600 mg total) by mouth every 6 (six) hours as needed for pain (por dolor en espalda.  Toma con comida.). 30 tablet 0  . insulin glargine (LANTUS) 100 UNIT/ML injection Inject 0.08 mLs (8 Units total) into the skin at bedtime. 10 mL 11  . lisinopril (PRINIVIL,ZESTRIL) 40 MG tablet Take 1 tablet (40 mg total) by mouth daily. 30 tablet 2  . acyclovir (ZOVIRAX) 400 MG tablet Take 2 tablets (800 mg total) by mouth 5 (five) times daily. 50 tablet 0  . docusate sodium (COLACE) 100 MG  capsule Take 1 capsule (100 mg total) by mouth 2 (two) times daily. 10 capsule 0  . glucose blood test strip Use as instructed 100 each 12  . insulin aspart (NOVOLOG) 100 unit/ml SOLN Inject 12 Units into the skin 2 (two) times daily. (Patient not taking: Reported on 11/19/2014) 1 pen 2  . ondansetron (ZOFRAN) 4 MG tablet Take 1 tablet (4 mg total) by mouth every 8 (eight) hours as needed for nausea or vomiting. (Patient not taking: Reported on 11/19/2014) 20 tablet 0  . oxyCODONE-acetaminophen (PERCOCET) 5-325 MG per tablet Take 1-2 tablets by mouth every 4 (four) hours as needed for severe pain. 90 tablet 0  . [DISCONTINUED] hydrALAZINE (APRESOLINE) 10 MG tablet Take 1 tablet (10 mg total) by mouth 3 (three) times daily. 90 tablet 3  . [DISCONTINUED] insulin NPH (HUMULIN N,NOVOLIN N) 100 UNIT/ML injection Inject 18 Units into the skin 2 (two) times daily before a meal. 1 vial 12  . [DISCONTINUED] insulin NPH-insulin regular (NOVOLIN 70/30) (70-30) 100 UNIT/ML injection Inject 20 Units into the skin 2 (two) times daily with a meal. Please provide one-month supply, provide patient syringes and needles. 10 mL 12   No current facility-administered medications on file prior to visit.   Family History  Problem Relation Age of Onset  . Cancer Brother   . Heart disease Sister  Social History   Social History  . Marital Status: Single    Spouse Name: N/A  . Number of Children: N/A  . Years of Education: N/A   Occupational History  . Not on file.   Social History Main Topics  . Smoking status: Never Smoker   . Smokeless tobacco: Not on file  . Alcohol Use: No  . Drug Use: No  . Sexual Activity: Not on file   Other Topics Concern  . Not on file   Social History Narrative   ** Merged History Encounter **       Lives with son Bobbye Riggs who comes with her to majority of visits and helps coordinate care for her. He translates for her and has signed and patient signed a release for this on  02/02/12.           Review of Systems: Other than what is stated in HPI, all other systems are negative.  Objective:   Filed Vitals:   11/19/14 1622  BP: 155/85  Pulse: 58  Temp: 98.5 F (36.9 C)  Resp: 16    Physical Exam  Constitutional: She is oriented to person, place, and time.  Cardiovascular: Normal rate, regular rhythm and normal heart sounds.   Pulses:      Dorsalis pedis pulses are 2+ on the right side.       Posterior tibial pulses are 2+ on the right side.  Pulmonary/Chest: Effort normal and breath sounds normal.  Musculoskeletal: She exhibits no edema.  Feet:  Right Foot:  Skin Integrity: Negative for skin breakdown.  Left Foot:  Skin Integrity: Negative for skin breakdown.  Neurological: She is alert and oriented to person, place, and time.  Skin: Skin is warm and dry.     Lab Results  Component Value Date   WBC 10.3 10/28/2014   HGB 13.2 10/28/2014   HCT 42.0 10/28/2014   MCV 80.3 10/28/2014   PLT 199 10/28/2014   Lab Results  Component Value Date   CREATININE 0.84 10/28/2014   BUN 12 10/28/2014   NA 135 10/28/2014   K 4.6 10/28/2014   CL 101 10/28/2014   CO2 25 10/28/2014    Lab Results  Component Value Date   HGBA1C 9.4* 10/26/2014   Lipid Panel     Component Value Date/Time   CHOL 192 10/22/2014 1009   TRIG 138 10/22/2014 1009   HDL 51 10/22/2014 1009   CHOLHDL 3.8 10/22/2014 1009   VLDL 28 10/22/2014 1009   LDLCALC 113 10/22/2014 1009       Assessment and plan:   Colena was seen today for follow-up.  Diagnoses and all orders for this visit:  Type 2 diabetes mellitus, uncontrolled -     Glucose (CBG) Patients diabetes remains uncontrolled as evidence by hemoglobin a1c >8.  Patient has been non-compliant with medication regimen. Stressed the multiple complications associated with uncontrolled diabetes.  Patient will stay on current medication dose and report back to clinic with cbg log in 2 weeks.  Essential  hypertension -     lisinopril (PRINIVIL,ZESTRIL) 40 MG tablet; Take 1 tablet (40 mg total) by mouth daily. Patient blood pressure is stable and may continue on current medication.  Education on diet, exercise, and modifiable risk factors discussed. Will obtain appropriate labs as needed. Will follow up in 3-6 months.   Diabetic polyneuropathy associated with type 2 diabetes mellitus -    Refilled gabapentin (NEURONTIN) 300 MG capsule; Take 1 capsule (300 mg total)  by mouth 2 (two) times daily. Stressed strict glycemic control to prevent further complications   HLD (hyperlipidemia) -     Refill atorvastatin (LIPITOR) 40 MG tablet; Take 1 tablet (40 mg total) by mouth daily. Last LDL was 10/2014 with a level of 113. She is not currently at goal. Education provided on proper lifestyle changes in order to lower cholesterol. Patient advised to maintain healthy weight and to keep total fat intake at 25-35% of total calories and carbohydrates 50-60% of total daily calories. Explained how high cholesterol places patient at risk for heart disease. Patient placed on appropriate medication and repeat labs in 6 months    Due to language barrier, an interpreter was present during the history-taking and subsequent discussion (and for part of the physical exam) with this patient.  Return in about 2 weeks (around 12/03/2014) for Nurse Visit-log review ad 3 mo PCP .        Ambrose Finland, NP-C [facility] [phone] 11/19/2014, 4:36 PM

## 2015-03-14 HISTORY — PX: FRACTURE SURGERY: SHX138

## 2015-04-02 ENCOUNTER — Other Ambulatory Visit: Payer: Self-pay | Admitting: Internal Medicine

## 2015-04-02 MED FILL — ?ATORVASTATIN 40MG TABLET: 40 | 30 days supply | Qty: 30 | Fill #2

## 2015-04-02 MED FILL — LISINOPRIL 40 MG TABLET: 40 | 30 days supply | Qty: 30 | Fill #1

## 2015-05-13 ENCOUNTER — Telehealth: Payer: Self-pay | Admitting: Internal Medicine

## 2015-05-13 DIAGNOSIS — E1165 Type 2 diabetes mellitus with hyperglycemia: Principal | ICD-10-CM

## 2015-05-13 DIAGNOSIS — Z794 Long term (current) use of insulin: Principal | ICD-10-CM

## 2015-05-13 DIAGNOSIS — IMO0001 Reserved for inherently not codable concepts without codable children: Secondary | ICD-10-CM

## 2015-05-13 DIAGNOSIS — G5793 Unspecified mononeuropathy of bilateral lower limbs: Secondary | ICD-10-CM

## 2015-05-13 MED FILL — LISINOPRIL 40 MG TABLET: 40 | 30 days supply | Qty: 30 | Fill #2

## 2015-05-13 MED FILL — ?ATORVASTATIN 40MG TABLET: 40 | 30 days supply | Qty: 30 | Fill #0

## 2015-05-13 NOTE — Telephone Encounter (Signed)
Patient came in requesting a medication refill for test strips. Please follow up.

## 2015-05-14 MED ORDER — GLUCOSE BLOOD VI STRP: ORAL_STRIP | Status: DC

## 2015-05-14 NOTE — Telephone Encounter (Signed)
Interpreter line used Meghan Ford ID# [id number] Returned patient phone call Patient not available Message left on machine to return our call

## 2015-06-28 MED FILL — LISINOPRIL 40 MG TABLET: 40 | 30 days supply | Qty: 30 | Fill #0

## 2015-06-28 MED FILL — ?ATORVASTATIN 40MG TABLET: 40 | 30 days supply | Qty: 30 | Fill #1

## 2015-08-11 ENCOUNTER — Telehealth: Payer: Self-pay | Admitting: Internal Medicine

## 2015-08-11 DIAGNOSIS — E785 Hyperlipidemia, unspecified: Secondary | ICD-10-CM

## 2015-08-11 MED ORDER — ATORVASTATIN CALCIUM 40 MG PO TABS: 40.0000 mg | ORAL_TABLET | Freq: Every day | ORAL | Status: DC

## 2015-08-11 MED ORDER — LISINOPRIL 40 MG PO TABS: 40.0000 mg | ORAL_TABLET | Freq: Every day | ORAL | Status: DC

## 2015-08-11 NOTE — Telephone Encounter (Signed)
Pt. Came into facility requesting a refill on the following medications:  atorvastatin (LIPITOR) 40 MG tablet  lisinopril (PRINIVIL,ZESTRIL) 40 MG tablet   Pt. Was informed that her medications were not going to be refilled b/c  She needed an OV. She scheduled an appointment for 08/25/15. Pt. Would  Like to know if she can get a refill. Please f/u with pt.

## 2015-08-11 NOTE — Telephone Encounter (Signed)
Lisinopril and atorvastatin refilled to last until office visit for 08/25/15.

## 2015-08-16 MED FILL — LISINOPRIL 40 MG TABLET: 40 | 30 days supply | Qty: 30 | Fill #0

## 2015-08-16 MED FILL — ?ATORVASTATIN 40MG TABLET: 40 | 30 days supply | Qty: 30 | Fill #0

## 2015-08-25 ENCOUNTER — Ambulatory Visit: Payer: Medicaid Other | Attending: Family Medicine | Admitting: Family Medicine

## 2015-08-25 ENCOUNTER — Encounter: Payer: Self-pay | Admitting: Family Medicine

## 2015-08-25 VITALS — BP 186/81 | HR 67 | Temp 98.1°F | Resp 16 | Ht 64.5 in | Wt 215.8 lb

## 2015-08-25 DIAGNOSIS — Z79899 Other long term (current) drug therapy: Secondary | ICD-10-CM | POA: Insufficient documentation

## 2015-08-25 DIAGNOSIS — E785 Hyperlipidemia, unspecified: Secondary | ICD-10-CM

## 2015-08-25 DIAGNOSIS — Z7982 Long term (current) use of aspirin: Secondary | ICD-10-CM | POA: Insufficient documentation

## 2015-08-25 DIAGNOSIS — Z91199 Patient's noncompliance with other medical treatment and regimen due to unspecified reason: Secondary | ICD-10-CM

## 2015-08-25 DIAGNOSIS — Z794 Long term (current) use of insulin: Secondary | ICD-10-CM

## 2015-08-25 DIAGNOSIS — I10 Essential (primary) hypertension: Secondary | ICD-10-CM

## 2015-08-25 DIAGNOSIS — Z9114 Patient's other noncompliance with medication regimen: Secondary | ICD-10-CM | POA: Insufficient documentation

## 2015-08-25 DIAGNOSIS — Z91119 Patient's noncompliance with dietary regimen due to unspecified reason: Secondary | ICD-10-CM

## 2015-08-25 DIAGNOSIS — E1140 Type 2 diabetes mellitus with diabetic neuropathy, unspecified: Secondary | ICD-10-CM

## 2015-08-25 DIAGNOSIS — Z9111 Patient's noncompliance with dietary regimen: Secondary | ICD-10-CM

## 2015-08-25 LAB — GLUCOSE, POCT (MANUAL RESULT ENTRY)
POC Glucose: 447 mg/dl — AB (ref 70–99)
POC Glucose: 385 mg/dL — AB (ref 70–99)

## 2015-08-25 LAB — POCT URINALYSIS DIPSTICK
Bilirubin, UA: NEGATIVE
Glucose, UA: 500
Ketones, UA: NEGATIVE
Leukocytes, UA: NEGATIVE
Nitrite, UA: NEGATIVE
Protein, UA: 100
Urobilinogen, UA: 1
pH, UA: 6

## 2015-08-25 LAB — POCT GLYCOSYLATED HEMOGLOBIN (HGB A1C): Hemoglobin A1C: 13.7

## 2015-08-25 MED ORDER — LISINOPRIL 40 MG PO TABS
40.0000 mg | ORAL_TABLET | Freq: Every day | ORAL | Status: DC
Start: 2015-08-25 — End: 2015-11-24

## 2015-08-25 MED ORDER — VALSARTAN-HYDROCHLOROTHIAZIDE 160-12.5 MG PO TABS
1.0000 | ORAL_TABLET | Freq: Once | ORAL | Status: AC
  Administered 2015-08-25: 1 via ORAL

## 2015-08-25 MED ORDER — TRUEPLUS LANCETS 28G MISC: 1.0000 | Freq: Three times a day (TID) | Status: DC

## 2015-08-25 MED ORDER — INSULIN ASPART 100 UNIT/ML ~~LOC~~ SOLN
15.0000 [IU] | Freq: Once | SUBCUTANEOUS | Status: AC
  Administered 2015-08-25: 15 [IU] via SUBCUTANEOUS

## 2015-08-25 MED ORDER — ATORVASTATIN CALCIUM 40 MG PO TABS: 40.0000 mg | ORAL_TABLET | Freq: Every day | ORAL | Status: DC

## 2015-08-25 MED ORDER — INSULIN GLARGINE 100 UNIT/ML ~~LOC~~ SOLN: 15.0000 [IU] | Freq: Every day | SUBCUTANEOUS | Status: DC

## 2015-08-25 MED ORDER — AMLODIPINE BESYLATE 10 MG PO TABS
10.0000 mg | ORAL_TABLET | Freq: Every day | ORAL | Status: DC
Start: 2015-08-25 — End: 2016-01-07

## 2015-08-25 MED FILL — AMLODIPINE BESYLATE 10 MG T: 10 | 30 days supply | Qty: 30 | Fill #0

## 2015-08-25 MED FILL — TRUEplus LANCETS 28G MISC: 33 days supply | Qty: 100 | Fill #0

## 2015-08-25 MED FILL — !LANTUS SOLOSTAR 100UNITS/M: 100 | 35 days supply | Qty: 6 | Fill #0

## 2015-08-25 NOTE — Patient Instructions (Signed)
La diabetes mellitus y los alimentos (Diabetes Mellitus and Food) Es importante que controle su nivel de azcar en la sangre (glucosa). El nivel de glucosa en sangre depende en gran medida de lo que usted come. Comer alimentos saludables en las cantidades adecuadas a lo largo del da, aproximadamente a la misma hora todos los das, lo ayudar a controlar su nivel de glucosa en sangre. Tambin puede ayudarlo a retrasar o evitar el empeoramiento de la diabetes mellitus. Comer de manera saludable incluso puede ayudarlo a mejorar el nivel de presin arterial y a alcanzar o mantener un peso saludable.  Entre las recomendaciones generales para alimentarse y cocinar los alimentos de forma saludable, se incluyen las siguientes:  Respetar las comidas principales y comer colaciones con regularidad. Evitar pasar largos perodos sin comer con el fin de perder peso.  Seguir una dieta que consista principalmente en alimentos de origen vegetal, como frutas, vegetales, frutos secos, legumbres y cereales integrales.  Utilizar mtodos de coccin a baja temperatura, como hornear, en lugar de mtodos de coccin a alta temperatura, como frer en abundante aceite. Trabaje con el nutricionista para aprender a usar la informacin nutricional de las etiquetas de los alimentos. CMO PUEDEN AFECTARME LOS ALIMENTOS? Carbohidratos Los carbohidratos afectan el nivel de glucosa en sangre ms que cualquier otro tipo de alimento. El nutricionista lo ayudar a determinar cuntos carbohidratos puede consumir en cada comida y ensearle a contarlos. El recuento de carbohidratos es importante para mantener la glucosa en sangre en un nivel saludable, en especial si utiliza insulina o toma determinados medicamentos para la diabetes mellitus. Alcohol El alcohol puede provocar disminuciones sbitas de la glucosa en sangre (hipoglucemia), en especial si utiliza insulina o toma determinados medicamentos para la diabetes mellitus. La  hipoglucemia es una afeccin que puede poner en peligro la vida. Los sntomas de la hipoglucemia (somnolencia, mareos y desorientacin) son similares a los sntomas de haber consumido mucho alcohol.  Si el mdico lo autoriza a beber alcohol, hgalo con moderacin y siga estas pautas:  Las mujeres no deben beber ms de un trago por da, y los hombres no deben beber ms de dos tragos por da. Un trago es igual a:  12 onzas (355 ml) de cerveza  5 onzas de vino (150 ml) de vino  1,5onzas (45ml) de bebidas espirituosas  No beba con el estmago vaco.  Mantngase hidratado. Beba agua, gaseosas dietticas o t helado sin azcar.  Las gaseosas comunes, los jugos y otros refrescos podran contener muchos carbohidratos y se deben contar. QU ALIMENTOS NO SE RECOMIENDAN? Cuando haga las elecciones de alimentos, es importante que recuerde que todos los alimentos son distintos. Algunos tienen menos nutrientes que otros por porcin, aunque podran tener la misma cantidad de caloras o carbohidratos. Es difcil darle al cuerpo lo que necesita cuando consume alimentos con menos nutrientes. Estos son algunos ejemplos de alimentos que debera evitar ya que contienen muchas caloras y carbohidratos, pero pocos nutrientes:  Grasas trans (la mayora de los alimentos procesados incluyen grasas trans en la etiqueta de Informacin nutricional).  Gaseosas comunes.  Jugos.  Caramelos.  Dulces, como tortas, pasteles, rosquillas y galletas.  Comidas fritas. QU ALIMENTOS PUEDO COMER? Consuma alimentos ricos en nutrientes, que nutrirn el cuerpo y lo mantendrn saludable. Los alimentos que debe comer tambin dependern de varios factores, como:  Las caloras que necesita.  Los medicamentos que toma.  Su peso.  El nivel de glucosa en sangre.  El nivel de presin arterial.  El nivel de colesterol.   Debe consumir una amplia variedad de alimentos, por ejemplo:  Protenas.  Cortes de carne  magros.  Protenas con bajo contenido de grasas saturadas, como pescado, clara de huevo y frijoles. Evite las carnes procesadas.  Frutas y vegetales.  Frutas y vegetales que pueden ayudar a controlar los niveles sanguneos de glucosa, como manzanas, mangos y batatas.  Productos lcteos.  Elija productos lcteos sin grasa o con bajo contenido de grasa, como leche, yogur y queso.  Cereales, panes, pastas y arroz.  Elija cereales integrales, como panes multicereales, avena en grano y arroz integral. Estos alimentos pueden ayudar a controlar la presin arterial.  Grasas.  Alimentos que contengan grasas saludables, como frutos secos, aguacate, aceite de oliva, aceite de canola y pescado. TODOS LOS QUE PADECEN DIABETES MELLITUS TIENEN EL MISMO PLAN DE COMIDAS? Dado que todas las personas que padecen diabetes mellitus son distintas, no hay un solo plan de comidas que funcione para todos. Es muy importante que se rena con un nutricionista que lo ayudar a crear un plan de comidas adecuado para usted.   Esta informacin no tiene como fin reemplazar el consejo del mdico. Asegrese de hacerle al mdico cualquier pregunta que tenga.   Document Released: 06/06/2007 Document Revised: 03/20/2014 Elsevier Interactive Patient Education 2016 [employer]vier Inc.  

## 2015-08-25 NOTE — Progress Notes (Signed)
Pt here for DM and HTN. Pt denies any pain. Pt has taken her medications today and does not need any refills on anything. Pt CBG is 447 and A1Cis 13.7. Pt last ate around 11:30am. Pt had coffee and a half piece of bread.

## 2015-08-25 NOTE — Progress Notes (Signed)
Subjective:  Patient ID: Meghan Ford, female    DOB: [birth year]  Age: 75 y.o. MRN: [id number]  CC: Diabetes and Hypertension   HPI Meghan Ford is a 75 year old female with a history of type 2 diabetes mellitus (A1c 13.7), diabetic neuropathy, hypertension, noncompliance who is here for a follow-up visit.  She is seen with the aid of a Spanish video interpreter and is accompanied by her son who endorses her noncompliance with a diabetic diet and informs me the patient drinks a lot of sodas and eats a lot of tortillas. Her blood sugar is 447 in the clinic today and she endorses taking Lantus 8 units last night but has not been taking NovoLog which was prescribed on her med list; reports that her blood sugars have been in the 127 that home but she does not have her log with her. She is not up-to-date on an annual eye exam.  Her blood pressure is also significantly elevated and she endorses compliance with her antihypertensives.  She is on Neurontin for diabetic neuropathy but informs me she has no numbness and does not need the medication at this time. Denies chest pains, headaches, blurry vision or shortness of breath and has no other complaints  Outpatient Prescriptions Prior to Visit  Medication Sig Dispense Refill  . ibuprofen (ADVIL,MOTRIN) 600 MG tablet Take 1 tablet (600 mg total) by mouth every 6 (six) hours as needed for pain (por dolor en espalda.  Toma con comida.). 30 tablet 0  . atorvastatin (LIPITOR) 40 MG tablet Take 1 tablet (40 mg total) by mouth daily. 30 tablet 0  . insulin glargine (LANTUS) 100 UNIT/ML injection Inject 0.08 mLs (8 Units total) into the skin at bedtime. 10 mL 11  . lisinopril (PRINIVIL,ZESTRIL) 40 MG tablet Take 1 tablet (40 mg total) by mouth daily. Needs office visit for more refills 30 tablet 0  . aspirin 325 MG tablet Take 1 tablet (325 mg total) by mouth daily. (Patient not taking: Reported on 08/25/2015) 30 tablet 0  .  glucose blood test strip Use as instructed (Patient not taking: Reported on 08/25/2015) 100 each 12  . insulin aspart (NOVOLOG) 100 unit/ml SOLN Inject 12 Units into the skin 2 (two) times daily. (Patient not taking: Reported on 11/19/2014) 1 pen 2  . acyclovir (ZOVIRAX) 400 MG tablet Take 2 tablets (800 mg total) by mouth 5 (five) times daily. (Patient not taking: Reported on 08/25/2015) 50 tablet 0  . docusate sodium (COLACE) 100 MG capsule Take 1 capsule (100 mg total) by mouth 2 (two) times daily. (Patient not taking: Reported on 08/25/2015) 10 capsule 0  . gabapentin (NEURONTIN) 300 MG capsule Take 1 capsule (300 mg total) by mouth 2 (two) times daily. (Patient not taking: Reported on 08/25/2015) 60 capsule 2  . ondansetron (ZOFRAN) 4 MG tablet Take 1 tablet (4 mg total) by mouth every 8 (eight) hours as needed for nausea or vomiting. (Patient not taking: Reported on 11/19/2014) 20 tablet 0  . oxyCODONE-acetaminophen (PERCOCET) 5-325 MG per tablet Take 1-2 tablets by mouth every 4 (four) hours as needed for severe pain. (Patient not taking: Reported on 08/25/2015) 90 tablet 0   No facility-administered medications prior to visit.    ROS Review of Systems  Constitutional: Negative for activity change, appetite change and fatigue.  HENT: Negative for congestion, sinus pressure and sore throat.   Eyes: Negative for visual disturbance.  Respiratory: Negative for cough, chest tightness, shortness of breath and wheezing.  Cardiovascular: Negative for chest pain and palpitations.  Gastrointestinal: Negative for abdominal pain, constipation and abdominal distention.  Endocrine: Negative for polydipsia.  Genitourinary: Negative for dysuria and frequency.  Musculoskeletal: Negative for back pain and arthralgias.  Skin: Negative for rash.  Neurological: Negative for tremors, light-headedness and numbness.  Hematological: Does not bruise/bleed easily.  Psychiatric/Behavioral: Negative for behavioral  problems and agitation.    Objective:  BP 207/79 mmHg  Pulse 67  Temp(Src) 98.1 F (36.7 C) (Oral)  Resp 16  Ht 5' 4.5" (1.638 m)  Wt 215 lb 12.8 oz (97.886 kg)  BMI 36.48 kg/m2  SpO2 96%  BP/Weight 08/25/2015 11/19/2014 10/22/2014  Systolic BP 207 126 187  Diastolic BP 79 88 86  Wt. (Lbs) 215.8 198.6 203  BMI 36.48 34.07 34.83      Physical Exam  Constitutional: She is oriented to person, place, and time. She appears well-developed and well-nourished.  Cardiovascular: Normal rate, normal heart sounds and intact distal pulses.   No murmur heard. Pulmonary/Chest: Effort normal and breath sounds normal. She has no wheezes. She has no rales. She exhibits no tenderness.  Abdominal: Soft. Bowel sounds are normal. She exhibits no distension and no mass. There is no tenderness.  Musculoskeletal: Normal range of motion.  Neurological: She is alert and oriented to person, place, and time.  Skin: Skin is warm and dry.  Psychiatric: She has a normal mood and affect.       Lab Results  Component Value Date   HGBA1C 13.7 08/25/2015    CMP Latest Ref Rng 10/28/2014 10/27/2014 10/26/2014  Glucose 65 - 99 mg/dL 161(W168(H) 960(A202(H) 540(J219(H)  BUN 6 - 20 mg/dL 12 12 12   Creatinine 0.44 - 1.00 mg/dL 8.110.84 9.140.76 7.820.68  Sodium 135 - 145 mmol/L 135 136 139  Potassium 3.5 - 5.1 mmol/L 4.6 4.1 4.3  Chloride 101 - 111 mmol/L 101 101 100(L)  CO2 22 - 32 mmol/L 25 20(L) 29  Calcium 8.9 - 10.3 mg/dL 9.5(A8.4(L) 2.1(H8.6(L) 9.5  Total Protein 6.1 - 8.1 g/dL - - -  Total Bilirubin 0.2 - 1.2 mg/dL - - -  Alkaline Phos 33 - 130 U/L - - -  AST 10 - 35 U/L - - -  ALT 6 - 29 U/L - - -     Lipid Panel     Component Value Date/Time   CHOL 192 10/22/2014 1009   TRIG 138 10/22/2014 1009   HDL 51 10/22/2014 1009   CHOLHDL 3.8 10/22/2014 1009   VLDL 28 10/22/2014 1009   LDLCALC 113 10/22/2014 1009      Assessment & Plan:   1. Type 2 diabetes mellitus with diabetic neuropathy, with long-term current use of  insulin (HCC) Uncontrolled with Hba1c of 13.7 due to dietary indiscretion and non compliance with medications- she has not been taking Novolog. Novolog 15 units administered for CBG of 447 and patient observed for 30 mins after which CBG  Repeated was 395 Incease dose of Lantus and will review blood sugar log at his next visit and will determine if Novolog is needed Discussed compliance with Diabetic diet Diabetic health care maintenance at next office visit. - insulin aspart (novoLOG) injection 15 Units; Inject 0.15 mLs (15 Units total) into the skin once. - HgB A1c - Glucose (CBG) - insulin glargine (LANTUS) 100 UNIT/ML injection; Inject 0.15 mLs (15 Units total) into the skin at bedtime.  Dispense: 10 mL; Refill: 3  2. Accelerated hypertension Severely elevated BP, no evidence of end organ  damage Single dose of valsartan/hctz administered Amlodipine added to regimen Low sodium diet. - valsartan-hydrochlorothiazide (DIOVAN-HCT) 160-12.5 MG per tablet 1 tablet; Take 1 tablet by mouth once. - lisinopril (PRINIVIL,ZESTRIL) 40 MG tablet; Take 1 tablet (40 mg total) by mouth daily. Needs office visit for more refills  Dispense: 30 tablet; Refill: 0 - amLODipine (NORVASC) 10 MG tablet; Take 1 tablet (10 mg total) by mouth daily.  Dispense: 90 tablet; Refill: 3  3. HLD (hyperlipidemia) LDL is 113 which is above  Goal of <1100 Lipid panel at next office visit. - atorvastatin (LIPITOR) 40 MG tablet; Take 1 tablet (40 mg total) by mouth daily.  Dispense: 30 tablet; Refill: 3   Meds ordered this encounter  Medications  . insulin aspart (novoLOG) injection 15 Units    Sig:   . valsartan-hydrochlorothiazide (DIOVAN-HCT) 160-12.5 MG per tablet 1 tablet    Sig:   . atorvastatin (LIPITOR) 40 MG tablet    Sig: Take 1 tablet (40 mg total) by mouth daily.    Dispense:  30 tablet    Refill:  3  . lisinopril (PRINIVIL,ZESTRIL) 40 MG tablet    Sig: Take 1 tablet (40 mg total) by mouth daily. Needs  office visit for more refills    Dispense:  30 tablet    Refill:  0  . insulin glargine (LANTUS) 100 UNIT/ML injection    Sig: Inject 0.15 mLs (15 Units total) into the skin at bedtime.    Dispense:  10 mL    Refill:  3    Discontinue previous previous dose  . amLODipine (NORVASC) 10 MG tablet    Sig: Take 1 tablet (10 mg total) by mouth daily.    Dispense:  90 tablet    Refill:  3    Follow-up: Return in about 3 weeks (around 09/15/2015) for Follow-up of diabetes and hypertension.   Jaclyn Shaggy MD

## 2015-08-26 DIAGNOSIS — Z9111 Patient's noncompliance with dietary regimen: Secondary | ICD-10-CM | POA: Insufficient documentation

## 2015-08-26 DIAGNOSIS — Z9114 Patient's other noncompliance with medication regimen: Secondary | ICD-10-CM

## 2015-08-26 DIAGNOSIS — Z91119 Patient's noncompliance with dietary regimen due to unspecified reason: Secondary | ICD-10-CM | POA: Insufficient documentation

## 2015-09-30 MED FILL — ATORVASTATIN 40 MG TABLET: 40 | 30 days supply | Qty: 30 | Fill #0

## 2015-09-30 MED FILL — ?LISINOPRIL 40 MG TABLET: 40 MG | 30 days supply | Qty: 30 | Fill #0

## 2015-09-30 MED FILL — LANTUS SOLOSTAR 100 UNITS/M: 100 | 35 days supply | Qty: 6 | Fill #1

## 2015-09-30 MED FILL — AMLODIPINE BESYLATE 10 MG T: 10 | 30 days supply | Qty: 30 | Fill #1

## 2015-11-24 ENCOUNTER — Other Ambulatory Visit: Payer: Self-pay | Admitting: Family Medicine

## 2015-11-24 DIAGNOSIS — I10 Essential (primary) hypertension: Secondary | ICD-10-CM

## 2015-11-24 MED FILL — AMLODIPINE BESYLATE 10 MG T: 10 | 30 days supply | Qty: 30 | Fill #2

## 2015-11-24 MED FILL — ?LISINOPRIL 40 MG TABLET: 40 MG | 30 days supply | Qty: 30 | Fill #0

## 2016-01-07 ENCOUNTER — Ambulatory Visit: Payer: Self-pay | Attending: Family Medicine | Admitting: Family Medicine

## 2016-01-07 ENCOUNTER — Encounter: Payer: Self-pay | Admitting: Family Medicine

## 2016-01-07 VITALS — BP 163/72 | HR 70 | Temp 98.4°F | Ht 64.5 in | Wt 219.5 lb

## 2016-01-07 DIAGNOSIS — E1100 Type 2 diabetes mellitus with hyperosmolarity without nonketotic hyperglycemic-hyperosmolar coma (NKHHC): Secondary | ICD-10-CM | POA: Insufficient documentation

## 2016-01-07 DIAGNOSIS — E1140 Type 2 diabetes mellitus with diabetic neuropathy, unspecified: Secondary | ICD-10-CM | POA: Insufficient documentation

## 2016-01-07 DIAGNOSIS — I739 Peripheral vascular disease, unspecified: Secondary | ICD-10-CM | POA: Insufficient documentation

## 2016-01-07 DIAGNOSIS — M2540 Effusion, unspecified joint: Secondary | ICD-10-CM | POA: Insufficient documentation

## 2016-01-07 DIAGNOSIS — Z794 Long term (current) use of insulin: Secondary | ICD-10-CM | POA: Insufficient documentation

## 2016-01-07 DIAGNOSIS — R059 Cough, unspecified: Secondary | ICD-10-CM

## 2016-01-07 DIAGNOSIS — I10 Essential (primary) hypertension: Secondary | ICD-10-CM | POA: Insufficient documentation

## 2016-01-07 DIAGNOSIS — Z7982 Long term (current) use of aspirin: Secondary | ICD-10-CM | POA: Insufficient documentation

## 2016-01-07 DIAGNOSIS — Z0000 Encounter for general adult medical examination without abnormal findings: Secondary | ICD-10-CM

## 2016-01-07 DIAGNOSIS — E7800 Pure hypercholesterolemia, unspecified: Secondary | ICD-10-CM | POA: Insufficient documentation

## 2016-01-07 DIAGNOSIS — Z9119 Patient's noncompliance with other medical treatment and regimen: Secondary | ICD-10-CM | POA: Insufficient documentation

## 2016-01-07 DIAGNOSIS — R05 Cough: Secondary | ICD-10-CM | POA: Insufficient documentation

## 2016-01-07 LAB — COMPLETE METABOLIC PANEL WITH GFR
ALBUMIN: 3.9 g/dL (ref 3.6–5.1)
ALT: 12 U/L (ref 6–29)
AST: 14 U/L (ref 10–35)
Alkaline Phosphatase: 82 U/L (ref 33–130)
BUN: 18 mg/dL (ref 7–25)
CALCIUM: 9.2 mg/dL (ref 8.6–10.4)
CHLORIDE: 99 mmol/L (ref 98–110)
CO2: 27 mmol/L (ref 20–31)
CREATININE: 0.71 mg/dL (ref 0.60–0.93)
GFR, Est African American: >89 mL/min (ref >=60)
GFR, Est Non African American: 85 mL/min (ref >=60)
Glucose, Bld: 284 mg/dL — ABNORMAL HIGH (ref 65–99)
Potassium: 4.4 mmol/L (ref 3.5–5.3)
Sodium: 137 mmol/L (ref 135–146)
Total Bilirubin: 0.7 mg/dL (ref 0.2–1.2)
Total Protein: 6.7 g/dL (ref 6.1–8.1)

## 2016-01-07 LAB — GLUCOSE, POCT (MANUAL RESULT ENTRY): POC GLUCOSE: 276 mg/dL — AB (ref 70–99)

## 2016-01-07 LAB — LIPID PANEL
CHOLESTEROL: 247 mg/dL — AB (ref 125–200)
HDL: 48 mg/dL (ref >=46)
LDL Cholesterol: 155 mg/dL — ABNORMAL HIGH (ref <130)
Total CHOL/HDL Ratio: 5.1 Ratio — ABNORMAL HIGH (ref <=5.0)
Triglycerides: 220 mg/dL — ABNORMAL HIGH (ref <150)
VLDL: 44 mg/dL — AB (ref <30)

## 2016-01-07 LAB — POCT GLYCOSYLATED HEMOGLOBIN (HGB A1C): Hemoglobin A1C: 12

## 2016-01-07 MED ORDER — BENZONATATE 100 MG PO CAPS: 100.0000 mg | ORAL_CAPSULE | Freq: Two times a day (BID) | ORAL | 0 refills | Status: DC | PRN

## 2016-01-07 MED ORDER — ATORVASTATIN CALCIUM 40 MG PO TABS: 40.0000 mg | ORAL_TABLET | Freq: Every day | ORAL | 3 refills | Status: DC

## 2016-01-07 MED ORDER — LOSARTAN POTASSIUM-HCTZ 100-12.5 MG PO TABS: 1.0000 | ORAL_TABLET | Freq: Every day | ORAL | 3 refills | Status: DC

## 2016-01-07 MED ORDER — AMLODIPINE BESYLATE 10 MG PO TABS: 10.0000 mg | ORAL_TABLET | Freq: Every day | ORAL | 3 refills | Status: DC

## 2016-01-07 MED ORDER — GABAPENTIN 300 MG PO CAPS: 300.0000 mg | ORAL_CAPSULE | Freq: Every day | ORAL | 3 refills | Status: DC

## 2016-01-07 MED FILL — BENZONATATE 100 MG CAPSULE: 100 | 10 days supply | Qty: 20 | Fill #0

## 2016-01-07 MED FILL — ?ATORVASTATIN 40MG TABLET: 40 | 30 days supply | Qty: 30 | Fill #0

## 2016-01-07 MED FILL — GABAPENTIN 300 MG CAPSULE: 300 | 30 days supply | Qty: 30 | Fill #0

## 2016-01-07 MED FILL — ?AMLODIPINE BESYLATE 10 MG: 10 | 30 days supply | Qty: 30 | Fill #0

## 2016-01-07 MED FILL — LOSARTAN-HCTZ 100-12.5 MG T: 100-12.5 | 30 days supply | Qty: 30 | Fill #0

## 2016-01-07 NOTE — Progress Notes (Signed)
12.0 Patient states that she is only taking one injection at night before bed Brought a insulin pen that she received from a" clinic" and wants to know if she can take it and if it's expired- she has 10 pens

## 2016-01-07 NOTE — Progress Notes (Signed)
Subjective:  Patient ID: Meghan Ford, female    DOB: 1940-07-24  Age: 75 y.o. MRN: [id number]  CC: Diabetes; Cough; Joint Swelling (right); and Hypertension   HPI Meghan Ford is a 75 year old female with a history of type 2 diabetes mellitus (A1c 12), diabetic neuropathy, hypertension, noncompliance who is here for a follow-up visit.  She is seen with the aid of a Spanish video interpreter and brings in her medications but does not have atorvastatin with her or her insulins. When asked how much insulin she takes she initially said 12 units at night but then corrected herself to 15 units when I informed her she was supposed to be on 15 units of Lantus. Her chart reveals she should also be on NovoLog 12 units twice daily however she endorses taking only one type of insulin and does not recall the name. States that fasting blood sugars have been in the 135-170 range however in the last week they have been is 200 range due to taking OTC NyQuil.  She has had a problem with compliance with regards to dietary indiscretion and medications Her blood pressure is also significantly elevated and she endorses compliance with her antihypertensives. Complains of a dry cough for the last 2 months  Past Medical History:  Diagnosis Date  . Diabetes mellitus type II, uncontrolled (HCC)   . Diabetes mellitus without complication (HCC)   . Fx humeral neck   . Hyperlipidemia LDL goal < 100   . Hypertension   . Leg pain    worse with prolonged standing  . Peripheral vascular disease (HCC)   . Varicose veins     Past Surgical History:  Procedure Laterality Date  . OTHER SURGICAL HISTORY     reports varicose vein procedure  . REVERSE SHOULDER ARTHROPLASTY Left 10/27/2014   Procedure: REVERSE SHOULDER ARTHROPLASTY;  Surgeon: Sheral Apleyimothy D Murphy, MD;  [facility];  Service: Orthopedics;  Laterality: Left;  . 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 VEIN SURGERY     left leg    No Known Allergies   Outpatient Medications Prior to Visit  Medication Sig Dispense Refill  . aspirin 325 MG tablet Take 1 tablet (325 mg total) by mouth daily. 30 tablet 0  . glucose blood test strip Use as instructed 100 each 12  . TRUEPLUS LANCETS 28G MISC 1 each by Does not apply route 3 (three) times daily before meals. 100 each 12  . amLODipine (NORVASC) 10 MG tablet Take 1 tablet (10 mg total) by mouth daily. 90 tablet 3  . lisinopril (PRINIVIL,ZESTRIL) 40 MG tablet Take 1 tablet (40 mg total) by mouth daily. 30 tablet 3  . ibuprofen (ADVIL,MOTRIN) 600 MG tablet Take 1 tablet (600 mg total) by mouth every 6 (six) hours as needed for pain (por dolor en espalda.  Toma con comida.). (Patient not taking: Reported on 01/07/2016) 30 tablet 0  . insulin aspart (NOVOLOG) 100 unit/ml SOLN Inject 12 Units into the skin 2 (two) times daily. (Patient not taking: Reported on 01/07/2016) 1 pen 2  . insulin glargine (LANTUS) 100 UNIT/ML injection Inject 0.15 mLs (15 Units total) into the skin at bedtime. 10 mL 3  . atorvastatin (LIPITOR) 40 MG tablet Take 1 tablet (40 mg total) by mouth daily. (Patient not taking: Reported on 01/07/2016) 30 tablet 3   No facility-administered medications  prior to visit.     ROS Review of Systems  Constitutional: Negative for activity change, appetite change and fatigue.  HENT: Negative for congestion, sinus pressure and sore throat.   Eyes: Negative for visual disturbance.  Respiratory: Positive for cough. Negative for chest tightness, shortness of breath and wheezing.   Cardiovascular: Negative for chest pain and palpitations.  Gastrointestinal: Negative for abdominal distention, abdominal pain and constipation.  Endocrine: Negative for polydipsia.  Genitourinary: Negative for dysuria and frequency.    Musculoskeletal: Negative for arthralgias and back pain.  Skin: Negative for rash.  Neurological: Negative for tremors, light-headedness and numbness.  Hematological: Does not bruise/bleed easily.  Psychiatric/Behavioral: Negative for agitation and behavioral problems.    Objective:  BP (!) 163/72 (BP Location: Right Arm, Patient Position: Sitting, Cuff Size: Large)   Pulse 70   Temp 98.4 F (36.9 C) (Oral)   Ht 5' 4.5" (1.638 m)   Wt 219 lb 8 oz (99.6 kg)   SpO2 100%   BMI 37.10 kg/m   BP/Weight 01/07/2016 08/25/2015 11/19/2014  Systolic BP 163 186 126  Diastolic BP 72 81 88  Wt. (Lbs) 219.5 215.8 198.6  BMI 37.1 36.48 34.07      Physical Exam  Constitutional: She is oriented to person, place, and time. She appears well-developed and well-nourished.  Neck: No JVD present.  Cardiovascular: Normal rate, normal heart sounds and intact distal pulses.   No murmur heard. Pulmonary/Chest: Effort normal and breath sounds normal. She has no wheezes. She has no rales. She exhibits no tenderness.  Abdominal: Soft. Bowel sounds are normal. She exhibits no distension and no mass. There is no tenderness.  Musculoskeletal: Normal range of motion.  Neurological: She is alert and oriented to person, place, and time.  Skin: Skin is warm and dry.  Psychiatric: She has a normal mood and affect.    Lab Results  Component Value Date   HGBA1C 12.0 01/07/2016   Lipid Panel     Component Value Date/Time   CHOL 192 10/22/2014 1009   TRIG 138 10/22/2014 1009   HDL 51 10/22/2014 1009   CHOLHDL 3.8 10/22/2014 1009   VLDL 28 10/22/2014 1009   LDLCALC 113 10/22/2014 1009    Assessment & Plan:   1. Uncontrolled type 2 diabetes mellitus with hyperosmolarity without coma, without long-term current use of insulin (HCC) Uncontrolled with A1c of 12.0 Poor control is largely due to noncompliance. She should be taking Lantus 15 units at bedtime and NovoLog 12 units twice daily There is some  discrepancy regarding how much of insulin she is taking and if she is taking Lantus or NovoLog She takes 15 units of "insulin" at bedtime and has been advised to keep doing so until her visit with the clinical pharmacist (patient to bring in all medications including insulin, syringes and needles) after which her dose will be corrected. - Glucose (CBG) - HgB A1c  2. Type 2 diabetes mellitus with diabetic neuropathy, with long-term current use of insulin (HCC) Placed on gabapentin - COMPLETE METABOLIC PANEL WITH GFR - Lipid panel - Microalbumin / creatinine urine ratio  3. Pure hypercholesterolemia Noncompliant with atorvastatin; compliance emphasized. 10 year cardiovascular risk is 44.77% If lipids elevated and we'll make no regimen changes due to noncompliance Low cholesterol diet - atorvastatin (LIPITOR) 40 MG tablet; Take 1 tablet (40 mg total) by mouth daily.  Dispense: 30 tablet; Refill: 3  4. Essential hypertension Uncontrolled Switch from lisinopril to losartan/HCTZ - amLODipine (NORVASC) 10 MG tablet;  Take 1 tablet (10 mg total) by mouth daily.  Dispense: 90 tablet; Refill: 3 - losartan-hydrochlorothiazide (HYZAAR) 100-12.5 MG tablet; Take 1 tablet by mouth daily.  Dispense: 90 tablet; Refill: 3  5. Cough Likely ACE inhibitor induced Discontinue a someplace and ARB. - benzonatate (TESSALON) 100 MG capsule; Take 1 capsule (100 mg total) by mouth 2 (two) times daily as needed for cough.  Dispense: 20 capsule; Refill: 0  6. Healthcare maintenance Declines flu shot. Mammogram and colonoscopy at next visit.   Meds ordered this encounter  Medications  . atorvastatin (LIPITOR) 40 MG tablet    Sig: Take 1 tablet (40 mg total) by mouth daily.    Dispense:  30 tablet    Refill:  3  . amLODipine (NORVASC) 10 MG tablet    Sig: Take 1 tablet (10 mg total) by mouth daily.    Dispense:  90 tablet    Refill:  3  . losartan-hydrochlorothiazide (HYZAAR) 100-12.5 MG tablet    Sig:  Take 1 tablet by mouth daily.    Dispense:  90 tablet    Refill:  3  . benzonatate (TESSALON) 100 MG capsule    Sig: Take 1 capsule (100 mg total) by mouth 2 (two) times daily as needed for cough.    Dispense:  20 capsule    Refill:  0    Follow-up: Return for follow up of Diabetes mellitus; schedule with Misty Stanley on 01/11/16.   Jaclyn Shaggy MD

## 2016-01-08 LAB — MICROALBUMIN / CREATININE URINE RATIO
CREATININE, URINE: 124 mg/dL (ref 20–320)
Microalb Creat Ratio: 374 mcg/mg creat — ABNORMAL HIGH (ref <30)
Microalb, Ur: 46.4 mg/dL

## 2016-01-11 ENCOUNTER — Telehealth: Payer: Self-pay

## 2016-01-11 ENCOUNTER — Ambulatory Visit: Payer: Self-pay | Attending: Internal Medicine | Admitting: Pharmacist

## 2016-01-11 DIAGNOSIS — E1100 Type 2 diabetes mellitus with hyperosmolarity without nonketotic hyperglycemic-hyperosmolar coma (NKHHC): Secondary | ICD-10-CM

## 2016-01-11 DIAGNOSIS — Z794 Long term (current) use of insulin: Secondary | ICD-10-CM

## 2016-01-11 DIAGNOSIS — E119 Type 2 diabetes mellitus without complications: Secondary | ICD-10-CM | POA: Insufficient documentation

## 2016-01-11 DIAGNOSIS — E1140 Type 2 diabetes mellitus with diabetic neuropathy, unspecified: Secondary | ICD-10-CM

## 2016-01-11 NOTE — Patient Instructions (Signed)
Thanks for coming to see us!  Follow up with Dr. Venetia NightAmao

## 2016-01-11 NOTE — Telephone Encounter (Signed)
Writer spoke with patient through [employer] and discussed her labe results with her.  Patient stated understanding.

## 2016-01-11 NOTE — Progress Notes (Signed)
    S:    Patient arrives in good spirits.  Presents for medication education and management at the request of Dr. Venetia NightAmao. Patient was referred on 01/07/16.  Patient was last seen by Primary Care Provider on 01/07/16. [employer] Vernona RiegerLaura #[id number] was present for the entirety of the visit.   Patient reports adherence with medications.  Current diabetes medications include: Lantus 15 units twice daily. She is not taking Novolog.  She presents with all of her medications. She reports that she stopped taking the gabapentin due to cough. She never received the Novolog and did not know that she was supposed to be on it. All other medications are taken as prescribed.   O:  Lab Results  Component Value Date   HGBA1C 12.0 01/07/2016   There were no vitals filed for this visit.  Home fasting CBG: 100s 2 hour post-prandial/random CBG: 100s  A/P: Diabetes longstanding currently UNcontrolled based on A1c of 12 and home readings. Patient reports adherence with medication that she thought she was supposed to be taking. She is only taking Lantus 15 units BID. She is not taking the Novolog and has never picked it up. Control is suboptimal due to no post-prandial insulin use. Will not adjust medications at this time but patient will continue to take her medications as she has been. She will follow up with Dr. Venetia NightAmao for diabetes management.   Next A1C anticipated January 2018.    Written patient instructions provided.  Total time in face to face counseling 30 minutes.   Follow up with Dr. Venetia NightAmao.   Patient seen with Donnajean LopesSonya Anderson, PharmD Candidate

## 2016-01-11 NOTE — Telephone Encounter (Signed)
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 Please encourage compliance with medications, low-cholesterol diabetic diet

## 2016-03-08 ENCOUNTER — Other Ambulatory Visit: Payer: Self-pay | Admitting: Family Medicine

## 2016-03-08 DIAGNOSIS — R059 Cough, unspecified: Secondary | ICD-10-CM

## 2016-03-08 DIAGNOSIS — R05 Cough: Secondary | ICD-10-CM

## 2016-03-08 MED FILL — AMLODIPINE BESYLATE 10 MG T: 10 | 30 days supply | Qty: 30 | Fill #1

## 2016-03-08 MED FILL — GABAPENTIN 300 MG CAPSULE: 300 | 30 days supply | Qty: 30 | Fill #1

## 2016-03-08 MED FILL — BENZONATATE 100 MG CAPSULE: 100 | 10 days supply | Qty: 20 | Fill #0

## 2016-03-08 MED FILL — ?ATORVASTATIN 40MG TABLET: 40 | 30 days supply | Qty: 30 | Fill #1

## 2016-03-08 MED FILL — LOSARTAN-HCTZ 100-12.5 MG T: 100-12.5 | 30 days supply | Qty: 30 | Fill #1

## 2016-03-20 ENCOUNTER — Ambulatory Visit: Payer: Self-pay | Attending: Family Medicine | Admitting: Family Medicine

## 2016-03-20 ENCOUNTER — Encounter: Payer: Self-pay | Admitting: Family Medicine

## 2016-03-20 VITALS — BP 156/80 | HR 72 | Temp 98.5°F | Ht 64.0 in | Wt 212.2 lb

## 2016-03-20 DIAGNOSIS — B373 Candidiasis of vulva and vagina: Secondary | ICD-10-CM | POA: Insufficient documentation

## 2016-03-20 DIAGNOSIS — Z9114 Patient's other noncompliance with medication regimen: Secondary | ICD-10-CM | POA: Insufficient documentation

## 2016-03-20 DIAGNOSIS — E1100 Type 2 diabetes mellitus with hyperosmolarity without nonketotic hyperglycemic-hyperosmolar coma (NKHHC): Secondary | ICD-10-CM | POA: Insufficient documentation

## 2016-03-20 DIAGNOSIS — E1151 Type 2 diabetes mellitus with diabetic peripheral angiopathy without gangrene: Secondary | ICD-10-CM | POA: Insufficient documentation

## 2016-03-20 DIAGNOSIS — Z794 Long term (current) use of insulin: Secondary | ICD-10-CM | POA: Insufficient documentation

## 2016-03-20 DIAGNOSIS — R682 Dry mouth, unspecified: Secondary | ICD-10-CM | POA: Insufficient documentation

## 2016-03-20 DIAGNOSIS — I10 Essential (primary) hypertension: Secondary | ICD-10-CM | POA: Insufficient documentation

## 2016-03-20 DIAGNOSIS — Z9119 Patient's noncompliance with other medical treatment and regimen: Secondary | ICD-10-CM | POA: Insufficient documentation

## 2016-03-20 DIAGNOSIS — Z79899 Other long term (current) drug therapy: Secondary | ICD-10-CM | POA: Insufficient documentation

## 2016-03-20 DIAGNOSIS — E7800 Pure hypercholesterolemia, unspecified: Secondary | ICD-10-CM | POA: Insufficient documentation

## 2016-03-20 DIAGNOSIS — E1140 Type 2 diabetes mellitus with diabetic neuropathy, unspecified: Secondary | ICD-10-CM | POA: Insufficient documentation

## 2016-03-20 DIAGNOSIS — B3731 Acute candidiasis of vulva and vagina: Secondary | ICD-10-CM

## 2016-03-20 DIAGNOSIS — Z7982 Long term (current) use of aspirin: Secondary | ICD-10-CM | POA: Insufficient documentation

## 2016-03-20 LAB — GLUCOSE, POCT (MANUAL RESULT ENTRY): POC GLUCOSE: 366 mg/dL — AB (ref 70–99)

## 2016-03-20 MED ORDER — AMLODIPINE BESYLATE 10 MG PO TABS: 10.0000 mg | ORAL_TABLET | Freq: Every day | ORAL | 3 refills | Status: DC

## 2016-03-20 MED ORDER — ATORVASTATIN CALCIUM 80 MG PO TABS: 80.0000 mg | ORAL_TABLET | Freq: Every day | ORAL | 3 refills | Status: DC

## 2016-03-20 MED ORDER — INSULIN GLARGINE 100 UNIT/ML ~~LOC~~ SOLN: 25.0000 [IU] | Freq: Every day | SUBCUTANEOUS | 3 refills | Status: DC

## 2016-03-20 MED ORDER — LOSARTAN POTASSIUM-HCTZ 100-25 MG PO TABS: 1.0000 | ORAL_TABLET | Freq: Every day | ORAL | 3 refills | Status: DC

## 2016-03-20 MED ORDER — GABAPENTIN 300 MG PO CAPS: 300.0000 mg | ORAL_CAPSULE | Freq: Every day | ORAL | 3 refills | Status: DC

## 2016-03-20 MED ORDER — FLUCONAZOLE 150 MG PO TABS: 150.0000 mg | ORAL_TABLET | Freq: Once | ORAL | 0 refills | Status: AC

## 2016-03-20 MED FILL — LOSARTAN-HCTZ 100-25 MG TAB: 100-25 | 30 days supply | Qty: 30 | Fill #0

## 2016-03-20 MED FILL — FLUCONAZOLE 150 MG TABLET: 150 | 1 days supply | Qty: 1 | Fill #0

## 2016-03-20 NOTE — Progress Notes (Signed)
Subjective:  Patient ID: Meghan Ford, female    DOB: 1941/02/09  Age: 76 y.o. MRN: [id number]  CC: Diabetes; dry mouth; and Vaginal Itching   HPI Meghan Ford is a 76 year old female with a history of type 2 diabetes mellitus (A1c 12), diabetic neuropathy, hypertension, Hyperlipidemia, noncompliance who is here for a follow-up visit.  She is seen with the aid of a Spanish video interpreter.   She reports that her fasting sugars have been in the upper 200s and denies hypoglycemia. She has had a problem with compliance with regards to dietary indiscretion and medications Her blood pressure is also significantly elevated and she endorses compliance with her antihypertensives.  Complains of dry mouth with her medications but does not know which of them causes it. She also complains of vaginal itching but no discharge.  Past Medical History:  Diagnosis Date  . Diabetes mellitus type II, uncontrolled (HCC)   . Diabetes mellitus without complication (HCC)   . Fx humeral neck   . Hyperlipidemia LDL goal < 100   . Hypertension   . Leg pain    worse with prolonged standing  . Peripheral vascular disease (HCC)   . Varicose veins     Past Surgical History:  Procedure Laterality Date  . OTHER SURGICAL HISTORY     reports varicose vein procedure  . 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 aspirin 325 MG tablet Take 1 tablet (325 mg total) by mouth daily. 30 tablet 0  . benzonatate (TESSALON) 100 MG capsule TAKE 1  CAPSULE BY MOUTH 2 TIMES DAILY AS NEEDED FOR COUGH. 20 capsule 0  . glucose blood test strip Use as instructed 100 each 12  . ibuprofen (ADVIL,MOTRIN) 600 MG tablet Take 1 tablet (600 mg total) by mouth every 6 (six) hours as needed for pain (por dolor en espalda.  Toma con comida.). 30 tablet 0  . TRUEPLUS LANCETS 28G MISC 1 each by Does not apply route 3 (three) times daily before meals. 100 each 12  . amLODipine (NORVASC) 10 MG tablet Take 1 tablet (10 mg total) by mouth daily. 90 tablet 3  . atorvastatin (LIPITOR) 40 MG tablet Take 1 tablet (40 mg total) by mouth daily. 30 tablet 3  . gabapentin (NEURONTIN) 300 MG capsule Take 1 capsule (300 mg total) by mouth at bedtime. 30 capsule 3  . insulin aspart (NOVOLOG) 100 unit/ml SOLN Inject 12 Units into the skin 2 (two) times daily. 1 pen 2  . insulin glargine (LANTUS) 100 UNIT/ML injection Inject 0.15 mLs (15 Units total) into the skin at bedtime. (Patient taking differently: Inject 15 Units into the skin 2 (two) times daily. ) 10 mL 3  . losartan-hydrochlorothiazide (HYZAAR) 100-12.5 MG tablet Take 1 tablet by mouth daily. 90 tablet 3   No facility-administered medications prior to visit.     ROS Review of Systems Constitutional: Negative for activity change, appetite change and fatigue.  HENT: Negative for congestion, sinus pressure and sore throat.   Eyes: Negative for visual disturbance.  Respiratory:  Negative for cough, chest tightness, shortness of breath and wheezing.   Cardiovascular: Negative for chest pain and palpitations.  Gastrointestinal: Negative for abdominal distention, abdominal pain and constipation.  Endocrine: Negative for polydipsia.  Genitourinary: Negative for dysuria and frequency.  positive for vaginal itching Musculoskeletal: Negative for arthralgias and back pain.  Skin: Negative for rash.  Neurological: Negative for tremors, light-headedness and numbness.  Hematological: Does not bruise/bleed easily.    Psychiatric/Behavioral: Negative for agitation and behavioral problems  Objective:  BP (!) 156/80 (BP Location: Right Arm, Patient Position: Sitting, Cuff Size: Large)   Pulse 72   Temp 98.5 F (36.9 C) (Oral)   Ht 5\' 4"  (1.626 m)   Wt 212 lb 3.2 oz (96.3 kg)   SpO2 97%   BMI 36.42 kg/m   BP/Weight 03/20/2016 01/07/2016 08/25/2015  Systolic BP 156 163 186  Diastolic BP 80 72 81  Wt. (Lbs) 212.2 219.5 215.8  BMI 36.42 37.1 36.48      Physical Exam Constitutional: She is oriented to person, place, and time. She appears well-developed and well-nourished.  Neck: No JVD present.  Cardiovascular: Normal rate, normal heart sounds and intact distal pulses.   No murmur heard. Pulmonary/Chest: Effort normal and breath sounds normal. She has no wheezes. She has no rales. She exhibits no tenderness.  Abdominal: Soft. Bowel sounds are normal. She exhibits no distension and no mass. There is no tenderness.  Musculoskeletal: Normal range of motion.  Neurological: She is alert and oriented to person, place, and time.  Skin: Skin is warm and dry.  Psychiatric: She has a normal mood and affect.    Lab Results  Component Value Date   HGBA1C 12.0 01/07/2016    CMP Latest Ref Rng & Units 01/07/2016 10/28/2014 10/27/2014  Glucose 65 - 99 mg/dL 562(Z) 308(M) 578(I)  BUN 7 - 25 mg/dL 18 12 12   Creatinine 0.60 - 0.93 mg/dL 6.96 2.95 2.84  Sodium 135 - 146 mmol/L 137 135 136  Potassium 3.5 - 5.3 mmol/L 4.4 4.6 4.1  Chloride 98 - 110 mmol/L 99 101 101  CO2 20 - 31 mmol/L 27 25 20(L)  Calcium 8.6 - 10.4 mg/dL 9.2 1.3(K) 4.4(W)  Total Protein 6.1 - 8.1 g/dL 6.7 - -  Total Bilirubin 0.2 - 1.2 mg/dL 0.7 - -  Alkaline Phos 33 - 130 U/L 82 - -  AST 10 - 35 U/L 14 - -  ALT 6 - 29 U/L 12 - -    Lipid Panel     Component Value Date/Time   CHOL 247 (H) 01/07/2016 1230   TRIG 220 (H) 01/07/2016 1230   HDL 48 01/07/2016 1230   CHOLHDL 5.1 (H) 01/07/2016 1230   VLDL 44 (H) 01/07/2016 1230    LDLCALC 155 (H) 01/07/2016 1230    Assessment & Plan:   1. Uncontrolled type 2 diabetes mellitus with hyperosmolarity without coma, with long-term current use of insulin (HCC) - Glucose (CBG)  2. Essential hypertension Uncontrolled Raise dose of losartan/HCTZ - losartan-hydrochlorothiazide (HYZAAR) 100-25 MG tablet; Take 1 tablet by mouth daily.  Dispense: 30 tablet; Refill: 3 - amLODipine (NORVASC) 10 MG tablet; Take 1 tablet (10 mg total) by mouth daily.  Dispense: 30 tablet; Refill: 3  3. Pure hypercholesterolemia Uncontrolled due to noncompliance Increase Lipitor from 40 mg to 80 mg - atorvastatin (LIPITOR) 80 MG tablet; Take 1 tablet (80 mg total) by mouth daily.  Dispense: 30 tablet; Refill: 3  4. Type 2 diabetes mellitus with diabetic  neuropathy, with long-term current use of insulin (HCC) Uncontrolled with A1c of 12.0 due to noncompliance and dietary indiscretion Increase dose of Lantus from 50 units to 25 units - gabapentin (NEURONTIN) 300 MG capsule; Take 1 capsule (300 mg total) by mouth at bedtime.  Dispense: 30 capsule; Refill: 3 - insulin glargine (LANTUS) 100 UNIT/ML injection; Inject 0.25 mLs (25 Units total) into the skin at bedtime.  Dispense: 3 vial; Refill: 3  5. Vaginal candidiasis Likely due to hyperglycemia - fluconazole (DIFLUCAN) 150 MG tablet; Take 1 tablet (150 mg total) by mouth once.  Dispense: 1 tablet; Refill: 0   Meds ordered this encounter  Medications  . losartan-hydrochlorothiazide (HYZAAR) 100-25 MG tablet    Sig: Take 1 tablet by mouth daily.    Dispense:  30 tablet    Refill:  3    Discontinue previous dose  . amLODipine (NORVASC) 10 MG tablet    Sig: Take 1 tablet (10 mg total) by mouth daily.    Dispense:  30 tablet    Refill:  3  . atorvastatin (LIPITOR) 80 MG tablet    Sig: Take 1 tablet (80 mg total) by mouth daily.    Dispense:  30 tablet    Refill:  3  . gabapentin (NEURONTIN) 300 MG capsule    Sig: Take 1 capsule (300 mg  total) by mouth at bedtime.    Dispense:  30 capsule    Refill:  3  . insulin glargine (LANTUS) 100 UNIT/ML injection    Sig: Inject 0.25 mLs (25 Units total) into the skin at bedtime.    Dispense:  3 vial    Refill:  3    Discontinue previous previous dose  . fluconazole (DIFLUCAN) 150 MG tablet    Sig: Take 1 tablet (150 mg total) by mouth once.    Dispense:  1 tablet    Refill:  0    Follow-up: Return in about 3 months (around 06/18/2016) for Follow-up on diabetes mellitus.   Jaclyn ShaggyEnobong Amao MD

## 2016-03-21 MED FILL — !LANTUS 100 UNITS/ML VIAL: 100 | 40 days supply | Qty: 10 | Fill #0

## 2016-06-30 ENCOUNTER — Ambulatory Visit: Payer: Self-pay | Attending: Family Medicine | Admitting: Family Medicine

## 2016-06-30 ENCOUNTER — Encounter: Payer: Self-pay | Admitting: Family Medicine

## 2016-06-30 VITALS — BP 180/90 | HR 66 | Temp 98.2°F | Ht 64.0 in | Wt 214.8 lb

## 2016-06-30 DIAGNOSIS — E1151 Type 2 diabetes mellitus with diabetic peripheral angiopathy without gangrene: Secondary | ICD-10-CM | POA: Insufficient documentation

## 2016-06-30 DIAGNOSIS — I10 Essential (primary) hypertension: Secondary | ICD-10-CM | POA: Insufficient documentation

## 2016-06-30 DIAGNOSIS — Z794 Long term (current) use of insulin: Secondary | ICD-10-CM | POA: Insufficient documentation

## 2016-06-30 DIAGNOSIS — Z9119 Patient's noncompliance with other medical treatment and regimen: Secondary | ICD-10-CM | POA: Insufficient documentation

## 2016-06-30 DIAGNOSIS — Z9111 Patient's noncompliance with dietary regimen: Secondary | ICD-10-CM | POA: Insufficient documentation

## 2016-06-30 DIAGNOSIS — Z96612 Presence of left artificial shoulder joint: Secondary | ICD-10-CM | POA: Insufficient documentation

## 2016-06-30 DIAGNOSIS — E1165 Type 2 diabetes mellitus with hyperglycemia: Secondary | ICD-10-CM | POA: Insufficient documentation

## 2016-06-30 DIAGNOSIS — Z9114 Patient's other noncompliance with medication regimen: Secondary | ICD-10-CM

## 2016-06-30 DIAGNOSIS — E7800 Pure hypercholesterolemia, unspecified: Secondary | ICD-10-CM | POA: Insufficient documentation

## 2016-06-30 DIAGNOSIS — E1140 Type 2 diabetes mellitus with diabetic neuropathy, unspecified: Secondary | ICD-10-CM | POA: Insufficient documentation

## 2016-06-30 DIAGNOSIS — Z7982 Long term (current) use of aspirin: Secondary | ICD-10-CM | POA: Insufficient documentation

## 2016-06-30 LAB — GLUCOSE, POCT (MANUAL RESULT ENTRY): POC Glucose: 245 mg/dl — AB (ref 70–99)

## 2016-06-30 LAB — POCT GLYCOSYLATED HEMOGLOBIN (HGB A1C): Hemoglobin A1C: 11.9

## 2016-06-30 MED ORDER — LOSARTAN POTASSIUM-HCTZ 100-25 MG PO TABS: 1.0000 | ORAL_TABLET | Freq: Every day | ORAL | 3 refills | Status: DC

## 2016-06-30 MED ORDER — GABAPENTIN 300 MG PO CAPS: 300.0000 mg | ORAL_CAPSULE | Freq: Every day | ORAL | 3 refills | Status: DC

## 2016-06-30 MED ORDER — ATORVASTATIN CALCIUM 80 MG PO TABS: 80.0000 mg | ORAL_TABLET | Freq: Every day | ORAL | 3 refills | Status: DC

## 2016-06-30 MED ORDER — AMLODIPINE BESYLATE 10 MG PO TABS: 10.0000 mg | ORAL_TABLET | Freq: Every day | ORAL | 3 refills | Status: DC

## 2016-06-30 MED ORDER — INSULIN GLARGINE 100 UNIT/ML ~~LOC~~ SOLN: 20.0000 [IU] | Freq: Two times a day (BID) | SUBCUTANEOUS | 3 refills | Status: DC

## 2016-06-30 MED FILL — AMLODIPINE BESYLATE 10 MG T: 10 | 30 days supply | Qty: 30 | Fill #0

## 2016-06-30 MED FILL — ATORVASTATIN 80 MG TABLET: 80 | 30 days supply | Qty: 30 | Fill #0

## 2016-06-30 MED FILL — GABAPENTIN 300 MG CAPSULE: 300 | 30 days supply | Qty: 30 | Fill #0

## 2016-06-30 MED FILL — LOSARTAN-HCTZ 100-25 MG TAB: 100-25 | 30 days supply | Qty: 30 | Fill #0

## 2016-06-30 NOTE — Progress Notes (Signed)
Medication refill- amlodipine, lipitor, hyzaar- out of all her BP meds

## 2016-06-30 NOTE — Progress Notes (Signed)
Subjective:  Patient ID: Meghan Ford, female    DOB: 10-Jun-1940  Age: 76 y.o. MRN: [id number]  CC: Diabetes and Hypertension   HPI Meghan Ford is a 76 year old female with a history of type 2 diabetes mellitus (A1c 11.9), diabetic neuropathy, hypertension, Hyperlipidemia, noncompliance who is here for a follow-up visit.  She is accompanied by her son and has no complaints today.   She reports that her fasting sugars have been in the upper 200s and denies hypoglycemia. She has had a problem with compliance with regards to dietary indiscretion and medications  Her blood pressure is also significantly elevated and she has run out of all her medications.  Denies hypoglycemia, chest pains or shortness of breath.   Past Medical History:  Diagnosis Date  . Diabetes mellitus type II, uncontrolled ([place])   . Diabetes mellitus without complication ([place])   . Fx humeral neck   . Hyperlipidemia LDL goal < 100   . Hypertension   . Leg pain    worse with prolonged standing  . Peripheral vascular disease ([place])   . Varicose veins     Past Surgical History:  Procedure Laterality Date  . OTHER SURGICAL HISTORY     reports varicose vein procedure  . REVERSE SHOULDER ARTHROPLASTY Left 10/27/2014   Procedure: REVERSE SHOULDER ARTHROPLASTY;  Surgeon: Renette Butters, MD;  Location: [facility];  Service: Orthopedics;  Laterality: Left;  . 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 aspirin 325 MG tablet Take 1 tablet (325 mg total) by mouth daily. 30 tablet 0  . glucose blood test strip Use as instructed 100 each 12  . ibuprofen (ADVIL,MOTRIN) 600 MG tablet Take 1 tablet (600 mg total) by mouth every 6 (six)  hours as needed for pain (por dolor en espalda.  Toma con comida.). 30 tablet 0  . TRUEPLUS LANCETS 28G MISC 1 each by Does not apply route 3 (three) times daily before meals. 100 each 12  . amLODipine (NORVASC) 10 MG tablet Take 1 tablet (10 mg total) by mouth daily. 30 tablet 3  . atorvastatin (LIPITOR) 80 MG tablet Take 1 tablet (80 mg total) by mouth daily. 30 tablet 3  . insulin glargine (LANTUS) 100 UNIT/ML injection Inject 0.25 mLs (25 Units total) into the skin at bedtime. 3 vial 3  . losartan-hydrochlorothiazide (HYZAAR) 100-25 MG tablet Take 1 tablet by mouth daily. 30 tablet 3  . benzonatate (TESSALON) 100 MG capsule TAKE 1 CAPSULE BY MOUTH 2 TIMES DAILY AS NEEDED FOR COUGH. (Patient not taking: Reported on 06/30/2016) 20 capsule 0  . gabapentin (NEURONTIN) 300 MG capsule Take 1 capsule (300 mg total) by mouth at bedtime. (Patient not taking: Reported on 06/30/2016) 30 capsule 3   No facility-administered medications prior to visit.     ROS Review of Systems  Constitutional: Negative for activity change, appetite change and fatigue.  HENT: Negative for congestion, sinus pressure and sore throat.   Eyes: Negative for visual disturbance.  Respiratory: Negative for cough, chest tightness, shortness of breath and wheezing.   Cardiovascular: Negative for chest pain and palpitations.  Gastrointestinal: Negative for abdominal distention, abdominal pain and constipation.  Endocrine: Negative for polydipsia.  Genitourinary: Negative  for dysuria and frequency.  Musculoskeletal: Negative for arthralgias and back pain.  Skin: Negative for rash.  Neurological: Negative for tremors, light-headedness and numbness.  Hematological: Does not bruise/bleed easily.  Psychiatric/Behavioral: Negative for agitation and behavioral problems.    Objective:  BP (!) 180/90 (BP Location: Right Arm, Cuff Size: Large)   Pulse 66   Temp 98.2 F (36.8 C) (Oral)   Ht '5\' 4"'$  (1.626 m)   Wt 214 lb 12.8 oz  (97.4 kg)   SpO2 99%   BMI 36.87 kg/m   BP/Weight 06/30/2016 03/20/2016 02/40/9735  Systolic BP 329 924 268  Diastolic BP 90 80 72  Wt. (Lbs) 214.8 212.2 219.5  BMI 36.87 36.42 37.1      Physical Exam Constitutional: She is oriented to person, place, and time. She appears well-developed and well-nourished.  Neck: No JVD present.  Cardiovascular: Normal rate, normal heart sounds and intact distal pulses.   No murmur heard. Pulmonary/Chest: Effort normal and breath sounds normal. She has no wheezes. She has no rales. She exhibits no tenderness.  Abdominal: Soft. Bowel sounds are normal. She exhibits no distension and no mass. There is no tenderness.  Musculoskeletal: Normal range of motion.  Neurological: She is alert and oriented to person, place, and time.  Skin: Skin is warm and dry.  Psychiatric: She has a normal mood and affect.    Lab Results  Component Value Date   HGBA1C 11.9 06/30/2016    CMP Latest Ref Rng & Units 01/07/2016 10/28/2014 10/27/2014  Glucose 65 - 99 mg/dL 284(H) 168(H) 202(H)  BUN 7 - 25 mg/dL '18 12 12  '$ Creatinine 0.60 - 0.93 mg/dL 0.71 0.84 0.76  Sodium 135 - 146 mmol/L 137 135 136  Potassium 3.5 - 5.3 mmol/L 4.4 4.6 4.1  Chloride 98 - 110 mmol/L 99 101 101  CO2 20 - 31 mmol/L 27 25 20(L)  Calcium 8.6 - 10.4 mg/dL 9.2 8.4(L) 8.6(L)  Total Protein 6.1 - 8.1 g/dL 6.7 - -  Total Bilirubin 0.2 - 1.2 mg/dL 0.7 - -  Alkaline Phos 33 - 130 U/L 82 - -  AST 10 - 35 U/L 14 - -  ALT 6 - 29 U/L 12 - -    Lipid Panel     Component Value Date/Time   CHOL 247 (H) 01/07/2016 1230   TRIG 220 (H) 01/07/2016 1230   HDL 48 01/07/2016 1230   CHOLHDL 5.1 (H) 01/07/2016 1230   VLDL 44 (H) 01/07/2016 1230   LDLCALC 155 (H) 01/07/2016 1230    Assessment & Plan:   1. Type 2 diabetes mellitus with diabetic neuropathy, without long-term current use of insulin ([place]) Uncontrolled due to poor compliance  2. Essential hypertension Uncontrolled, unable to give  Clonidine as she is bradycardic Low sodium - losartan-hydrochlorothiazide (HYZAAR) 100-25 MG tablet; Take 1 tablet by mouth daily.  Dispense: 30 tablet; Refill: 3 - amLODipine (NORVASC) 10 MG tablet; Take 1 tablet (10 mg total) by mouth daily.  Dispense: 30 tablet; Refill: 3 - CMP14+EGFR  3. Type 2 diabetes mellitus with diabetic neuropathy, with long-term current use of insulin ([facility]) Uncontrolled with A1c of 11.9 due to dietary indiscretion Increase lantus from 25 units qhs to 20 units bid Diabetic diet and lifestyle changes - Glucose (CBG) - HgB A1c - gabapentin (NEURONTIN) 300 MG capsule; Take 1 capsule (300 mg total) by mouth at bedtime.  Dispense: 30 capsule; Refill: 3 - insulin glargine (LANTUS) 100 UNIT/ML injection; Inject 0.2 mLs (20 Units total) into  the skin 2 (two) times daily.  Dispense: 3 vial; Refill: 3  4. Pure hypercholesterolemia Uncontrolled - atorvastatin (LIPITOR) 80 MG tablet; Take 1 tablet (80 mg total) by mouth daily.  Dispense: 30 tablet; Refill: 3  5. Noncompliance with diet and medication regimen Stressed consequences of non compliance but she is indifferent   Meds ordered this encounter  Medications  . losartan-hydrochlorothiazide (HYZAAR) 100-25 MG tablet    Sig: Take 1 tablet by mouth daily.    Dispense:  30 tablet    Refill:  3    Discontinue previous dose  . gabapentin (NEURONTIN) 300 MG capsule    Sig: Take 1 capsule (300 mg total) by mouth at bedtime.    Dispense:  30 capsule    Refill:  3  . atorvastatin (LIPITOR) 80 MG tablet    Sig: Take 1 tablet (80 mg total) by mouth daily.    Dispense:  30 tablet    Refill:  3  . amLODipine (NORVASC) 10 MG tablet    Sig: Take 1 tablet (10 mg total) by mouth daily.    Dispense:  30 tablet    Refill:  3  . insulin glargine (LANTUS) 100 UNIT/ML injection    Sig: Inject 0.2 mLs (20 Units total) into the skin 2 (two) times daily.    Dispense:  3 vial    Refill:  3    Discontinue previous previous dose      Follow-up: Return in about 3 months (around 09/29/2016) for follow up of Diabetes Mellitus.   Arnoldo Morale MD

## 2016-06-30 NOTE — Patient Instructions (Signed)
La diabetes mellitus y los alimentos (Diabetes Mellitus and Food) Es importante que controle su nivel de azcar en la sangre (glucosa). El nivel de glucosa en sangre depende en gran medida de lo que usted come. Comer alimentos saludables en las cantidades adecuadas a lo largo del da, aproximadamente a la misma hora todos los das, lo ayudar a controlar su nivel de glucosa en sangre. Tambin puede ayudarlo a retrasar o evitar el empeoramiento de la diabetes mellitus. Comer de manera saludable incluso puede ayudarlo a mejorar el nivel de presin arterial y a alcanzar o mantener un peso saludable. Entre las recomendaciones generales para alimentarse y cocinar los alimentos de forma saludable, se incluyen las siguientes:  Respetar las comidas principales y comer colaciones con regularidad. Evitar pasar largos perodos sin comer con el fin de perder peso.  Seguir una dieta que consista principalmente en alimentos de origen vegetal, como frutas, vegetales, frutos secos, legumbres y cereales integrales.  Utilizar mtodos de coccin a baja temperatura, como hornear, en lugar de mtodos de coccin a alta temperatura, como frer en abundante aceite. Trabaje con el nutricionista para aprender a usar la informacin nutricional de las etiquetas de los alimentos. CMO PUEDEN AFECTARME LOS ALIMENTOS? Carbohidratos Los carbohidratos afectan el nivel de glucosa en sangre ms que cualquier otro tipo de alimento. El nutricionista lo ayudar a determinar cuntos carbohidratos puede consumir en cada comida y ensearle a contarlos. El recuento de carbohidratos es importante para mantener la glucosa en sangre en un nivel saludable, en especial si utiliza insulina o toma determinados medicamentos para la diabetes mellitus. Alcohol El alcohol puede provocar disminuciones sbitas de la glucosa en sangre (hipoglucemia), en especial si utiliza insulina o toma determinados medicamentos para la diabetes mellitus. La  hipoglucemia es una afeccin que puede poner en peligro la vida. Los sntomas de la hipoglucemia (somnolencia, mareos y desorientacin) son similares a los sntomas de haber consumido mucho alcohol. Si el mdico lo autoriza a beber alcohol, hgalo con moderacin y siga estas pautas:  Las mujeres no deben beber ms de un trago por da, y los hombres no deben beber ms de dos tragos por da. Un trago es igual a:  12 onzas (355 ml) de cerveza  5 onzas de vino (150 ml) de vino  1,5onzas (45ml) de bebidas espirituosas  No beba con el estmago vaco.  Mantngase hidratado. Beba agua, gaseosas dietticas o t helado sin azcar.  Las gaseosas comunes, los jugos y otros refrescos podran contener muchos carbohidratos y se deben contar. QU ALIMENTOS NO SE RECOMIENDAN? Cuando haga las elecciones de alimentos, es importante que recuerde que todos los alimentos son distintos. Algunos tienen menos nutrientes que otros por porcin, aunque podran tener la misma cantidad de caloras o carbohidratos. Es difcil darle al cuerpo lo que necesita cuando consume alimentos con menos nutrientes. Estos son algunos ejemplos de alimentos que debera evitar ya que contienen muchas caloras y carbohidratos, pero pocos nutrientes:  Grasas trans (la mayora de los alimentos procesados incluyen grasas trans en la etiqueta de Informacin nutricional).  Gaseosas comunes.  Jugos.  Caramelos.  Dulces, como tortas, pasteles, rosquillas y galletas.  Comidas fritas. QU ALIMENTOS PUEDO COMER? Consuma alimentos ricos en nutrientes, que nutrirn el cuerpo y lo mantendrn saludable. Los alimentos que debe comer tambin dependern de varios factores, como:  Las caloras que necesita.  Los medicamentos que toma.  Su peso.  El nivel de glucosa en sangre.  El nivel de presin arterial.  El nivel de colesterol. Debe consumir   una amplia variedad de alimentos, por ejemplo:  Protenas.  Cortes de carne  magros.  Protenas con bajo contenido de grasas saturadas, como pescado, clara de huevo y frijoles. Evite las carnes procesadas.  Frutas y vegetales.  Frutas y vegetales que pueden ayudar a controlar los niveles sanguneos de glucosa, como manzanas, mangos y batatas.  Productos lcteos.  Elija productos lcteos sin grasa o con bajo contenido de grasa, como leche, yogur y queso.  Cereales, panes, pastas y arroz.  Elija cereales integrales, como panes multicereales, avena en grano y arroz integral. Estos alimentos pueden ayudar a controlar la presin arterial.  Grasas.  Alimentos que contengan grasas saludables, como frutos secos, aguacate, aceite de oliva, aceite de canola y pescado. TODOS LOS QUE PADECEN DIABETES MELLITUS TIENEN EL MISMO PLAN DE COMIDAS? Dado que todas las personas que padecen diabetes mellitus son distintas, no hay un solo plan de comidas que funcione para todos. Es muy importante que se rena con un nutricionista que lo ayudar a crear un plan de comidas adecuado para usted. Esta informacin no tiene como fin reemplazar el consejo del mdico. Asegrese de hacerle al mdico cualquier pregunta que tenga. Document Released: 06/06/2007 Document Revised: 03/20/2014 Document Reviewed: 01/24/2013 Elsevier Interactive Patient Education  2017 Elsevier Inc.  

## 2016-07-01 LAB — CMP14+EGFR
A/G RATIO: 1.4 (ref 1.2–2.2)
ALT: 14 IU/L (ref 0–32)
AST: 18 IU/L (ref 0–40)
Albumin: 3.8 g/dL (ref 3.5–4.8)
Alkaline Phosphatase: 97 IU/L (ref 39–117)
BUN/Creatinine Ratio: 25 (ref 12–28)
BUN: 21 mg/dL (ref 8–27)
Bilirubin Total: 0.3 mg/dL (ref 0.0–1.2)
CALCIUM: 9.4 mg/dL (ref 8.7–10.3)
CO2: 27 mmol/L (ref 18–29)
CREATININE: 0.84 mg/dL (ref 0.57–1.00)
Chloride: 97 mmol/L (ref 96–106)
GFR, EST AFRICAN AMERICAN: 80 mL/min/{1.73_m2} (ref >59)
GFR, EST NON AFRICAN AMERICAN: 69 mL/min/{1.73_m2} (ref >59)
Globulin, Total: 2.8 g/dL (ref 1.5–4.5)
Glucose: 243 mg/dL — ABNORMAL HIGH (ref 65–99)
Potassium: 4.8 mmol/L (ref 3.5–5.2)
Sodium: 137 mmol/L (ref 134–144)
TOTAL PROTEIN: 6.6 g/dL (ref 6.0–8.5)

## 2016-07-03 MED FILL — !LANTUS SOLOSTAR 100UNITS/M: 100 | 15 days supply | Qty: 6 | Fill #0

## 2016-07-12 ENCOUNTER — Telehealth: Payer: Self-pay

## 2016-07-12 NOTE — Telephone Encounter (Signed)
Through [facility] [phone] patient received her lab results and stated understanding.

## 2016-07-12 NOTE — Telephone Encounter (Signed)
-----   Message from Jaclyn Shaggy, MD sent at 07/03/2016  4:42 PM EDT ----- Labs reveal elevated glucose; please comply with new medication regimen from last office visit.

## 2016-10-04 ENCOUNTER — Encounter: Payer: Self-pay | Admitting: Family Medicine

## 2016-10-04 ENCOUNTER — Ambulatory Visit: Payer: Self-pay | Attending: Family Medicine | Admitting: Family Medicine

## 2016-10-04 VITALS — BP 189/75 | HR 75 | Temp 97.8°F | Wt 213.6 lb

## 2016-10-04 DIAGNOSIS — Z794 Long term (current) use of insulin: Secondary | ICD-10-CM

## 2016-10-04 DIAGNOSIS — E1151 Type 2 diabetes mellitus with diabetic peripheral angiopathy without gangrene: Secondary | ICD-10-CM | POA: Insufficient documentation

## 2016-10-04 DIAGNOSIS — E1100 Type 2 diabetes mellitus with hyperosmolarity without nonketotic hyperglycemic-hyperosmolar coma (NKHHC): Secondary | ICD-10-CM

## 2016-10-04 DIAGNOSIS — E7800 Pure hypercholesterolemia, unspecified: Secondary | ICD-10-CM

## 2016-10-04 DIAGNOSIS — I10 Essential (primary) hypertension: Secondary | ICD-10-CM

## 2016-10-04 DIAGNOSIS — Z79899 Other long term (current) drug therapy: Secondary | ICD-10-CM | POA: Insufficient documentation

## 2016-10-04 DIAGNOSIS — E1140 Type 2 diabetes mellitus with diabetic neuropathy, unspecified: Secondary | ICD-10-CM

## 2016-10-04 DIAGNOSIS — Z7982 Long term (current) use of aspirin: Secondary | ICD-10-CM | POA: Insufficient documentation

## 2016-10-04 LAB — POCT GLYCOSYLATED HEMOGLOBIN (HGB A1C): Hemoglobin A1C: 12.8

## 2016-10-04 LAB — GLUCOSE, POCT (MANUAL RESULT ENTRY)
POC Glucose: 368 mg/dl — AB (ref 70–99)
POC Glucose: 375 mg/dl — AB (ref 70–99)

## 2016-10-04 MED ORDER — GABAPENTIN 300 MG PO CAPS: 600.0000 mg | ORAL_CAPSULE | Freq: Every day | ORAL | 5 refills | Status: DC

## 2016-10-04 MED ORDER — INSULIN ASPART 100 UNIT/ML ~~LOC~~ SOLN
10.0000 [IU] | Freq: Once | SUBCUTANEOUS | Status: AC
  Administered 2016-10-04: 10 [IU] via SUBCUTANEOUS

## 2016-10-04 MED ORDER — ISOSORBIDE MONONITRATE ER 60 MG PO TB24: 60.0000 mg | ORAL_TABLET | Freq: Every day | ORAL | 5 refills | Status: DC

## 2016-10-04 MED ORDER — LOSARTAN POTASSIUM-HCTZ 100-25 MG PO TABS: 1.0000 | ORAL_TABLET | Freq: Every day | ORAL | 5 refills | Status: DC

## 2016-10-04 MED ORDER — AMLODIPINE BESYLATE 10 MG PO TABS: 10.0000 mg | ORAL_TABLET | Freq: Every day | ORAL | 5 refills | Status: DC

## 2016-10-04 MED ORDER — INSULIN GLARGINE 100 UNIT/ML ~~LOC~~ SOLN: 30.0000 [IU] | Freq: Two times a day (BID) | SUBCUTANEOUS | 5 refills | Status: DC

## 2016-10-04 MED ORDER — ATORVASTATIN CALCIUM 80 MG PO TABS: 80.0000 mg | ORAL_TABLET | Freq: Every day | ORAL | 5 refills | Status: DC

## 2016-10-04 MED FILL — ISOSORBIDE MN ER 60 MG TAB: 60 | 30 days supply | Qty: 30 | Fill #0

## 2016-10-04 MED FILL — GABAPENTIN 300 MG CAPSULE: 300 | 30 days supply | Qty: 60 | Fill #0

## 2016-10-04 MED FILL — ATORVASTATIN 80 MG TABLET: 80 | 30 days supply | Qty: 30 | Fill #0

## 2016-10-04 MED FILL — LOSARTAN-HCTZ 100-25 MG TAB: 100-25 | 30 days supply | Qty: 30 | Fill #0

## 2016-10-04 MED FILL — AMLODIPINE BESYLATE 10 MG T: 10 | 30 days supply | Qty: 30 | Fill #0

## 2016-10-04 NOTE — Progress Notes (Signed)
Subjective:  Patient ID: Meghan Ford, female    DOB: 13-May-1940  Age: 76 y.o. MRN: [id number]  CC: Diabetes   HPI Meghan Ford  is a 76 year old female with a history of type 2 diabetes mellitus (A1c 12.8), diabetic neuropathy, hypertension, Hyperlipidemia, noncompliance who is here for a follow-up visit.   She complains of crawling sensation on her skin, pins and needles and paresthesia in her lower extremities; she currently takes gabapentin 300 mg at night. She reports that her fasting sugars have been in the upper 200s and denies hypoglycemia. She has had a problem with compliance with regards to dietary indiscretion and medications Her blood sugar in the clinic is 375 and she endorses running out of  Lantus but prior to running out she took 25 units twice daily. NovoLog 10 units administered in the clinic and patient observed for 40 minutes prior to repeating blood sugar.  Her blood pressure is also significantly elevated and she has run out of all her medications.  Denies hypoglycemia, chest pains or shortness of breath  Past Medical History:  Diagnosis Date  . Diabetes mellitus type II, uncontrolled ([facility])   . Diabetes mellitus without complication ([facility])   . Fx humeral neck   . Hyperlipidemia LDL goal < 100   . Hypertension   . Leg pain    worse with prolonged standing  . Peripheral vascular disease ([facility])   . Varicose veins      Past Surgical History:  Procedure Laterality Date  . OTHER SURGICAL HISTORY     reports varicose vein procedure  . REVERSE SHOULDER ARTHROPLASTY Left 10/27/2014   Procedure: REVERSE SHOULDER ARTHROPLASTY;  Surgeon: Sheral Apley, MD;  Location: [facility];  Service: Orthopedics;  Laterality: Left;  . 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 aspirin 325 MG tablet Take 1 tablet (325 mg total) by mouth daily. 30 tablet 0  . glucose blood test strip Use as instructed 100 each 12  . ibuprofen (ADVIL,MOTRIN) 600 MG tablet Take 1 tablet (600 mg total) by mouth every 6 (six) hours as needed for pain (por dolor en espalda.  Toma con comida.). 30 tablet 0  . TRUEPLUS LANCETS 28G MISC 1 each by Does not apply route 3 (three) times daily before meals. 100 each 12  . amLODipine (NORVASC) 10 MG tablet Take 1 tablet (10 mg total) by mouth daily. 30 tablet 3  . atorvastatin (LIPITOR) 80 MG tablet Take 1 tablet (80 mg total) by mouth daily. 30 tablet 3  . gabapentin (NEURONTIN) 300 MG capsule Take 1 capsule (300 mg total) by mouth at bedtime. 30 capsule 3  . insulin glargine (LANTUS) 100 UNIT/ML injection Inject 0.2 mLs (20 Units total) into the skin 2 (two) times daily. 3 vial 3  . losartan-hydrochlorothiazide (HYZAAR) 100-25 MG tablet Take 1 tablet by mouth daily. 30 tablet 3   No facility-administered medications prior to visit.     ROS Review of Systems Constitutional: Negative for activity change, appetite change and fatigue.  HENT: Negative for congestion, sinus pressure and sore throat.   Eyes: Negative for visual disturbance.  Respiratory: Negative for cough, chest tightness, shortness of breath and wheezing.   Cardiovascular:  Negative for chest pain and palpitations.  Gastrointestinal: Negative for abdominal distention, abdominal pain and constipation.  Endocrine: Negative for polydipsia.  Genitourinary: Negative for dysuria and frequency.  Musculoskeletal: Negative for arthralgias and back pain.  Skin: Negative for rash.  Neurological: Negative for tremors, light-headedness and positive for numbness.  Hematological: Does not bruise/bleed easily.  Psychiatric/Behavioral: Negative for agitation and behavioral problems.  Objective:  BP (!)  189/75   Pulse 75   Temp 97.8 F (36.6 C) (Oral)   Wt 213 lb 9.6 oz (96.9 kg)   SpO2 96%   BMI 36.66 kg/m   BP/Weight 10/04/2016 06/30/2016 03/20/2016  Systolic BP 189 180 156  Diastolic BP 75 90 80  Wt. (Lbs) 213.6 214.8 212.2  BMI 36.66 36.87 36.42      Physical Exam Constitutional: She is oriented to person, place, and time. She appears well-developed and well-nourished.  Neck: No JVD present.  Cardiovascular: Normal rate, normal heart sounds and intact distal pulses.   No murmur heard. Pulmonary/Chest: Effort normal and breath sounds normal. She has no wheezes. She has no rales. She exhibits no tenderness.  Abdominal: Soft. Bowel sounds are normal. She exhibits no distension and no mass. There is no tenderness.  Musculoskeletal: Normal range of motion.  Neurological: She is alert and oriented to person, place, and time.  Skin: Skin is warm and dry.  Psychiatric: She has a normal mood and affect.    Lab Results  Component Value Date   HGBA1C 12.8 10/04/2016    CMP Latest Ref Rng & Units 06/30/2016 01/07/2016 10/28/2014  Glucose 65 - 99 mg/dL 161(W243(H) 960(A284(H) 540(J168(H)  BUN 8 - 27 mg/dL 21 18 12   Creatinine 0.57 - 1.00 mg/dL 8.110.84 9.140.71 7.820.84  Sodium 134 - 144 mmol/L 137 137 135  Potassium 3.5 - 5.2 mmol/L 4.8 4.4 4.6  Chloride 96 - 106 mmol/L 97 99 101  CO2 18 - 29 mmol/L 27 27 25   Calcium 8.7 - 10.3 mg/dL 9.4 9.2 9.5(A8.4(L)  Total Protein 6.0 - 8.5 g/dL 6.6 6.7 -  Total Bilirubin 0.0 - 1.2 mg/dL 0.3 0.7 -  Alkaline Phos 39 - 117 IU/L 97 82 -  AST 0 - 40 IU/L 18 14 -  ALT 0 - 32 IU/L 14 12 -    Lipid Panel     Component Value Date/Time   CHOL 247 (H) 01/07/2016 1230   TRIG 220 (H) 01/07/2016 1230   HDL 48 01/07/2016 1230   CHOLHDL 5.1 (H) 01/07/2016 1230   VLDL 44 (H) 01/07/2016 1230   LDLCALC 155 (H) 01/07/2016 1230     Assessment & Plan:   1. Uncontrolled type 2 diabetes mellitus with hyperosmolarity without coma, with long-term current use of insulin  ([facility]) Uncontrolled with A1c of 12.8 Increase Lantus to 30 units twice daily Keep blood sugar logs with fasting goals of 80-120 mg/dl, random of less than 213180 and in the event of sugars less than 60 mg/dl or greater than 086400 mg/dl please notify the clinic ASAP. It is recommended that you undergo annual eye exams and annual foot exams. Pneumovax is recommended every 5 years before the age of 74 and once for a lifetime at or after the age of 74. - POCT glucose (manual entry) - POCT glycosylated hemoglobin (Hb A1C) - insulin aspart (novoLOG) injection 10 Units; Inject 0.1 mLs (10 Units total) into the skin once.  2. Essential hypertension Uncontrolled due to running out of her antihypertensives Refilled medications and isosorbide added to regimen  Low-sodium diet, DASH diet - isosorbide mononitrate (IMDUR) 60 MG 24 hr tablet; Take 1 tablet (60 mg total) by mouth daily.  Dispense: 30 tablet; Refill: 5 - losartan-hydrochlorothiazide (HYZAAR) 100-25 MG tablet; Take 1 tablet by mouth daily.  Dispense: 30 tablet; Refill: 5 - amLODipine (NORVASC) 10 MG tablet; Take 1 tablet (10 mg total) by mouth daily.  Dispense: 30 tablet; Refill: 5  3. Type 2 diabetes mellitus with diabetic neuropathy, with long-term current use of insulin ([facility]) Uncontrolled Increase gabapentin from 300 mg at night to 600 mg at night - gabapentin (NEURONTIN) 300 MG capsule; Take 2 capsules (600 mg total) by mouth at bedtime.  Dispense: 60 capsule; Refill: 5 - insulin glargine (LANTUS) 100 UNIT/ML injection; Inject 0.3 mLs (30 Units total) into the skin 2 (two) times daily.  Dispense: 3 vial; Refill: 5  4. Pure hypercholesterolemia Uncontrolled Low cholesterol diet Lipid panel at next visit - atorvastatin (LIPITOR) 80 MG tablet; Take 1 tablet (80 mg total) by mouth daily.  Dispense: 30 tablet; Refill: 5   Meds ordered this encounter  Medications  . insulin aspart (novoLOG) injection 10 Units  . isosorbide mononitrate (IMDUR)  60 MG 24 hr tablet    Sig: Take 1 tablet (60 mg total) by mouth daily.    Dispense:  30 tablet    Refill:  5  . losartan-hydrochlorothiazide (HYZAAR) 100-25 MG tablet    Sig: Take 1 tablet by mouth daily.    Dispense:  30 tablet    Refill:  5    Discontinue previous dose  . amLODipine (NORVASC) 10 MG tablet    Sig: Take 1 tablet (10 mg total) by mouth daily.    Dispense:  30 tablet    Refill:  5  . gabapentin (NEURONTIN) 300 MG capsule    Sig: Take 2 capsules (600 mg total) by mouth at bedtime.    Dispense:  60 capsule    Refill:  5    Discontinue previous dose  . insulin glargine (LANTUS) 100 UNIT/ML injection    Sig: Inject 0.3 mLs (30 Units total) into the skin 2 (two) times daily.    Dispense:  3 vial    Refill:  5    Discontinue previous dose  . atorvastatin (LIPITOR) 80 MG tablet    Sig: Take 1 tablet (80 mg total) by mouth daily.    Dispense:  30 tablet    Refill:  5    Follow-up: Return in about 3 months (around 01/04/2017) for follow up of Diabetes.   This note has been created with Education officer, environmentalDragon speech recognition software and smart phrase technology. Any transcriptional errors are unintentional.     Jaclyn ShaggyEnobong Amao MD

## 2016-10-04 NOTE — Patient Instructions (Addendum)
La diabetes mellitus y los alimentos (Diabetes Mellitus and Food) Es importante que controle su nivel de azcar en la sangre (glucosa). El nivel de glucosa en sangre depende en gran medida de lo que usted come. Comer alimentos saludables en las cantidades adecuadas a lo largo del da, aproximadamente a la misma hora todos los das, lo ayudar a controlar su nivel de glucosa en sangre. Tambin puede ayudarlo a retrasar o evitar el empeoramiento de la diabetes mellitus. Comer de manera saludable incluso puede ayudarlo a mejorar el nivel de presin arterial y a alcanzar o mantener un peso saludable. Entre las recomendaciones generales para alimentarse y cocinar los alimentos de forma saludable, se incluyen las siguientes:  Respetar las comidas principales y comer colaciones con regularidad. Evitar pasar largos perodos sin comer con el fin de perder peso.  Seguir una dieta que consista principalmente en alimentos de origen vegetal, como frutas, vegetales, frutos secos, legumbres y cereales integrales.  Utilizar mtodos de coccin a baja temperatura, como hornear, en lugar de mtodos de coccin a alta temperatura, como frer en abundante aceite. Trabaje con el nutricionista para aprender a usar la informacin nutricional de las etiquetas de los alimentos. CMO PUEDEN AFECTARME LOS ALIMENTOS? Carbohidratos Los carbohidratos afectan el nivel de glucosa en sangre ms que cualquier otro tipo de alimento. El nutricionista lo ayudar a determinar cuntos carbohidratos puede consumir en cada comida y ensearle a contarlos. El recuento de carbohidratos es importante para mantener la glucosa en sangre en un nivel saludable, en especial si utiliza insulina o toma determinados medicamentos para la diabetes mellitus. Alcohol El alcohol puede provocar disminuciones sbitas de la glucosa en sangre (hipoglucemia), en especial si utiliza insulina o toma determinados medicamentos para la diabetes mellitus. La  hipoglucemia es una afeccin que puede poner en peligro la vida. Los sntomas de la hipoglucemia (somnolencia, mareos y desorientacin) son similares a los sntomas de haber consumido mucho alcohol. Si el mdico lo autoriza a beber alcohol, hgalo con moderacin y siga estas pautas:  Las mujeres no deben beber ms de un trago por da, y los hombres no deben beber ms de dos tragos por da. Un trago es igual a:  12 onzas (355 ml) de cerveza  5 onzas de vino (150 ml) de vino  1,5onzas (45ml) de bebidas espirituosas  No beba con el estmago vaco.  Mantngase hidratado. Beba agua, gaseosas dietticas o t helado sin azcar.  Las gaseosas comunes, los jugos y otros refrescos podran contener muchos carbohidratos y se deben contar. QU ALIMENTOS NO SE RECOMIENDAN? Cuando haga las elecciones de alimentos, es importante que recuerde que todos los alimentos son distintos. Algunos tienen menos nutrientes que otros por porcin, aunque podran tener la misma cantidad de caloras o carbohidratos. Es difcil darle al cuerpo lo que necesita cuando consume alimentos con menos nutrientes. Estos son algunos ejemplos de alimentos que debera evitar ya que contienen muchas caloras y carbohidratos, pero pocos nutrientes:  Grasas trans (la mayora de los alimentos procesados incluyen grasas trans en la etiqueta de Informacin nutricional).  Gaseosas comunes.  Jugos.  Caramelos.  Dulces, como tortas, pasteles, rosquillas y galletas.  Comidas fritas. QU ALIMENTOS PUEDO COMER? Consuma alimentos ricos en nutrientes, que nutrirn el cuerpo y lo mantendrn saludable. Los alimentos que debe comer tambin dependern de varios factores, como:  Las caloras que necesita.  Los medicamentos que toma.  Su peso.  El nivel de glucosa en sangre.  El nivel de presin arterial.  El nivel de colesterol. Debe consumir   una amplia variedad de alimentos, por ejemplo:  Protenas.  Cortes de carne  magros.  Protenas con bajo contenido de grasas saturadas, como pescado, clara de huevo y frijoles. Evite las carnes procesadas.  Frutas y vegetales.  Frutas y vegetales que pueden ayudar a controlar los niveles sanguneos de glucosa, como manzanas, mangos y batatas.  Productos lcteos.  Elija productos lcteos sin grasa o con bajo contenido de grasa, como leche, yogur y queso.  Cereales, panes, pastas y arroz.  Elija cereales integrales, como panes multicereales, avena en grano y arroz integral. Estos alimentos pueden ayudar a controlar la presin arterial.  Grasas.  Alimentos que contengan grasas saludables, como frutos secos, aguacate, aceite de oliva, aceite de canola y pescado. TODOS LOS QUE PADECEN DIABETES MELLITUS TIENEN EL MISMO PLAN DE COMIDAS? Dado que todas las personas que padecen diabetes mellitus son distintas, no hay un solo plan de comidas que funcione para todos. Es muy importante que se rena con un nutricionista que lo ayudar a crear un plan de comidas adecuado para usted. Esta informacin no tiene como fin reemplazar el consejo del mdico. Asegrese de hacerle al mdico cualquier pregunta que tenga. Document Released: 06/06/2007 Document Revised: 03/20/2014 Document Reviewed: 01/24/2013 Elsevier Interactive Patient Education  2017 Elsevier Inc.  

## 2016-10-05 MED FILL — LANTUS 100 UNITS/ML VIAL: 100 | 28 days supply | Qty: 20 | Fill #0

## 2016-11-06 ENCOUNTER — Encounter (HOSPITAL_COMMUNITY): Payer: Self-pay | Admitting: Emergency Medicine

## 2016-11-06 ENCOUNTER — Emergency Department (HOSPITAL_COMMUNITY): Payer: Self-pay

## 2016-11-06 DIAGNOSIS — Z7982 Long term (current) use of aspirin: Secondary | ICD-10-CM

## 2016-11-06 DIAGNOSIS — Z8249 Family history of ischemic heart disease and other diseases of the circulatory system: Secondary | ICD-10-CM

## 2016-11-06 DIAGNOSIS — R29810 Facial weakness: Secondary | ICD-10-CM | POA: Diagnosis present

## 2016-11-06 DIAGNOSIS — R202 Paresthesia of skin: Secondary | ICD-10-CM | POA: Diagnosis present

## 2016-11-06 DIAGNOSIS — I63511 Cerebral infarction due to unspecified occlusion or stenosis of right middle cerebral artery: Principal | ICD-10-CM | POA: Diagnosis present

## 2016-11-06 DIAGNOSIS — E871 Hypo-osmolality and hyponatremia: Secondary | ICD-10-CM | POA: Diagnosis present

## 2016-11-06 DIAGNOSIS — Z794 Long term (current) use of insulin: Secondary | ICD-10-CM

## 2016-11-06 DIAGNOSIS — Z9119 Patient's noncompliance with other medical treatment and regimen: Secondary | ICD-10-CM

## 2016-11-06 DIAGNOSIS — Y92009 Unspecified place in unspecified non-institutional (private) residence as the place of occurrence of the external cause: Secondary | ICD-10-CM

## 2016-11-06 DIAGNOSIS — E785 Hyperlipidemia, unspecified: Secondary | ICD-10-CM | POA: Diagnosis present

## 2016-11-06 DIAGNOSIS — E1151 Type 2 diabetes mellitus with diabetic peripheral angiopathy without gangrene: Secondary | ICD-10-CM | POA: Diagnosis present

## 2016-11-06 DIAGNOSIS — R29700 NIHSS score 0: Secondary | ICD-10-CM | POA: Diagnosis present

## 2016-11-06 DIAGNOSIS — E1143 Type 2 diabetes mellitus with diabetic autonomic (poly)neuropathy: Secondary | ICD-10-CM | POA: Diagnosis present

## 2016-11-06 DIAGNOSIS — G8194 Hemiplegia, unspecified affecting left nondominant side: Secondary | ICD-10-CM | POA: Diagnosis present

## 2016-11-06 DIAGNOSIS — E1165 Type 2 diabetes mellitus with hyperglycemia: Secondary | ICD-10-CM | POA: Diagnosis present

## 2016-11-06 DIAGNOSIS — Z96612 Presence of left artificial shoulder joint: Secondary | ICD-10-CM | POA: Diagnosis present

## 2016-11-06 DIAGNOSIS — Z9114 Patient's other noncompliance with medication regimen: Secondary | ICD-10-CM

## 2016-11-06 DIAGNOSIS — I10 Essential (primary) hypertension: Secondary | ICD-10-CM | POA: Diagnosis present

## 2016-11-06 DIAGNOSIS — K3184 Gastroparesis: Secondary | ICD-10-CM | POA: Diagnosis present

## 2016-11-06 DIAGNOSIS — Z6839 Body mass index (BMI) 39.0-39.9, adult: Secondary | ICD-10-CM

## 2016-11-06 DIAGNOSIS — T383X6A Underdosing of insulin and oral hypoglycemic [antidiabetic] drugs, initial encounter: Secondary | ICD-10-CM | POA: Diagnosis present

## 2016-11-06 LAB — COMPREHENSIVE METABOLIC PANEL
ALBUMIN: 3.6 g/dL (ref 3.5–5.0)
ALT: 16 U/L (ref 14–54)
ANION GAP: 8 (ref 5–15)
AST: 19 U/L (ref 15–41)
Alkaline Phosphatase: 104 U/L (ref 38–126)
BUN: 29 mg/dL — ABNORMAL HIGH (ref 6–20)
CALCIUM: 9 mg/dL (ref 8.9–10.3)
CHLORIDE: 97 mmol/L — AB (ref 101–111)
CO2: 27 mmol/L (ref 22–32)
Creatinine, Ser: 1.02 mg/dL — ABNORMAL HIGH (ref 0.44–1.00)
GFR calc non Af Amer: 53 mL/min — ABNORMAL LOW (ref >60)
Glucose, Bld: 452 mg/dL — ABNORMAL HIGH (ref 65–99)
POTASSIUM: 4 mmol/L (ref 3.5–5.1)
SODIUM: 132 mmol/L — AB (ref 135–145)
Total Bilirubin: 0.6 mg/dL (ref 0.3–1.2)
Total Protein: 6.8 g/dL (ref 6.5–8.1)

## 2016-11-06 LAB — I-STAT CHEM 8, ED
BUN: 32 mg/dL — ABNORMAL HIGH (ref 6–20)
CALCIUM ION: 1.13 mmol/L — AB (ref 1.15–1.40)
CHLORIDE: 96 mmol/L — AB (ref 101–111)
Creatinine, Ser: 1 mg/dL (ref 0.44–1.00)
Glucose, Bld: 452 mg/dL — ABNORMAL HIGH (ref 65–99)
HEMATOCRIT: 47 % — AB (ref 36.0–46.0)
Hemoglobin: 16 g/dL — ABNORMAL HIGH (ref 12.0–15.0)
Potassium: 4.1 mmol/L (ref 3.5–5.1)
SODIUM: 133 mmol/L — AB (ref 135–145)
TCO2: 27 mmol/L (ref 22–32)

## 2016-11-06 LAB — DIFFERENTIAL
BASOS PCT: 0 %
Basophils Absolute: 0 10*3/uL (ref 0.0–0.1)
EOS ABS: 0.1 10*3/uL (ref 0.0–0.7)
EOS PCT: 1 %
Lymphocytes Relative: 34 %
Lymphs Abs: 2.8 10*3/uL (ref 0.7–4.0)
MONO ABS: 0.4 10*3/uL (ref 0.1–1.0)
Monocytes Relative: 5 %
NEUTROS PCT: 60 %
Neutro Abs: 4.9 10*3/uL (ref 1.7–7.7)

## 2016-11-06 LAB — I-STAT TROPONIN, ED: Troponin i, poc: 0 ng/mL (ref 0.00–0.08)

## 2016-11-06 LAB — APTT: aPTT: 29 seconds (ref 24–36)

## 2016-11-06 LAB — CBC
HCT: 44.4 % (ref 36.0–46.0)
Hemoglobin: 14.3 g/dL (ref 12.0–15.0)
MCH: 25.8 pg — AB (ref 26.0–34.0)
MCHC: 32.2 g/dL (ref 30.0–36.0)
MCV: 80.1 fL (ref 78.0–100.0)
PLATELETS: 218 10*3/uL (ref 150–400)
RBC: 5.54 MIL/uL — ABNORMAL HIGH (ref 3.87–5.11)
RDW: 14.4 % (ref 11.5–15.5)
WBC: 8.2 10*3/uL (ref 4.0–10.5)

## 2016-11-06 LAB — PROTIME-INR
INR: 1
Prothrombin Time: 13.2 seconds (ref 11.4–15.2)

## 2016-11-06 NOTE — ED Triage Notes (Signed)
Pt presents with numbness and weakness to L side since 0900 today with headache and facial numbness which is resolved; pts family member here with her to translate; pt also endorsing some vision blurriness that began the same time; no hx of stroke

## 2016-11-07 ENCOUNTER — Observation Stay (HOSPITAL_BASED_OUTPATIENT_CLINIC_OR_DEPARTMENT_OTHER): Payer: Self-pay

## 2016-11-07 ENCOUNTER — Other Ambulatory Visit (HOSPITAL_COMMUNITY): Payer: Self-pay

## 2016-11-07 ENCOUNTER — Inpatient Hospital Stay (HOSPITAL_COMMUNITY)
Admission: EM | Admit: 2016-11-07 | Discharge: 2016-11-09 | DRG: 065 | Disposition: A | Discharge status: Home Health | Payer: Self-pay | Attending: Internal Medicine | Admitting: Internal Medicine

## 2016-11-07 ENCOUNTER — Observation Stay (HOSPITAL_COMMUNITY): Payer: Self-pay

## 2016-11-07 DIAGNOSIS — Z794 Long term (current) use of insulin: Secondary | ICD-10-CM

## 2016-11-07 DIAGNOSIS — E1140 Type 2 diabetes mellitus with diabetic neuropathy, unspecified: Secondary | ICD-10-CM

## 2016-11-07 DIAGNOSIS — E785 Hyperlipidemia, unspecified: Secondary | ICD-10-CM | POA: Diagnosis present

## 2016-11-07 DIAGNOSIS — IMO0002 Reserved for concepts with insufficient information to code with codable children: Secondary | ICD-10-CM

## 2016-11-07 DIAGNOSIS — I639 Cerebral infarction, unspecified: Secondary | ICD-10-CM

## 2016-11-07 DIAGNOSIS — G5793 Unspecified mononeuropathy of bilateral lower limbs: Secondary | ICD-10-CM

## 2016-11-07 DIAGNOSIS — E1165 Type 2 diabetes mellitus with hyperglycemia: Secondary | ICD-10-CM

## 2016-11-07 DIAGNOSIS — I10 Essential (primary) hypertension: Secondary | ICD-10-CM

## 2016-11-07 DIAGNOSIS — G459 Transient cerebral ischemic attack, unspecified: Secondary | ICD-10-CM | POA: Diagnosis present

## 2016-11-07 DIAGNOSIS — I6789 Other cerebrovascular disease: Secondary | ICD-10-CM

## 2016-11-07 DIAGNOSIS — I63511 Cerebral infarction due to unspecified occlusion or stenosis of right middle cerebral artery: Secondary | ICD-10-CM

## 2016-11-07 DIAGNOSIS — IMO0001 Reserved for inherently not codable concepts without codable children: Secondary | ICD-10-CM

## 2016-11-07 DIAGNOSIS — I679 Cerebrovascular disease, unspecified: Secondary | ICD-10-CM

## 2016-11-07 LAB — RAPID URINE DRUG SCREEN, HOSP PERFORMED
Amphetamines: NOT DETECTED
Barbiturates: NOT DETECTED
Benzodiazepines: NOT DETECTED
Cocaine: NOT DETECTED
OPIATES: NOT DETECTED
Tetrahydrocannabinol: NOT DETECTED

## 2016-11-07 LAB — VAS US CAROTID
LCCADDIAS: -16 cm/s
LEFT ECA DIAS: -10 cm/s
LEFT VERTEBRAL DIAS: 11 cm/s
LICADDIAS: -23 cm/s
LICADSYS: -93 cm/s
LICAPSYS: -76 cm/s
Left CCA dist sys: -106 cm/s
Left CCA prox dias: 10 cm/s
Left CCA prox sys: 101 cm/s
Left ICA prox dias: -16 cm/s
RCCADSYS: 29 cm/s
RIGHT ECA DIAS: -12 cm/s
RIGHT VERTEBRAL DIAS: 11 cm/s
Right CCA prox dias: -9 cm/s
Right CCA prox sys: -63 cm/s

## 2016-11-07 LAB — GLUCOSE, CAPILLARY
GLUCOSE-CAPILLARY: 374 mg/dL — AB (ref 65–99)
GLUCOSE-CAPILLARY: 307 mg/dL — AB (ref 65–99)
Glucose-Capillary: 271 mg/dL — ABNORMAL HIGH (ref 65–99)
Glucose-Capillary: 161 mg/dL — ABNORMAL HIGH (ref 65–99)

## 2016-11-07 LAB — LIPID PANEL
Cholesterol: 134 mg/dL (ref 0–200)
HDL: 49 mg/dL (ref >40)
LDL CALC: 56 mg/dL (ref 0–99)
TRIGLYCERIDES: 145 mg/dL (ref <150)
Total CHOL/HDL Ratio: 2.7 RATIO
VLDL: 29 mg/dL (ref 0–40)

## 2016-11-07 LAB — HEMOGLOBIN A1C
Hgb A1c MFr Bld: 13.1 % — ABNORMAL HIGH (ref 4.8–5.6)
Mean Plasma Glucose: 329.27 mg/dL

## 2016-11-07 LAB — ECHOCARDIOGRAM COMPLETE

## 2016-11-07 MED ORDER — GADOBENATE DIMEGLUMINE 529 MG/ML IV SOLN
20.0000 mL | Freq: Once | INTRAVENOUS | Status: AC
  Administered 2016-11-07: 20 mL via INTRAVENOUS

## 2016-11-07 MED ORDER — INSULIN GLARGINE 100 UNIT/ML ~~LOC~~ SOLN
30.0000 [IU] | Freq: Two times a day (BID) | SUBCUTANEOUS | Status: DC
  Administered 2016-11-07 – 2016-11-09 (×5): 30 [IU] via SUBCUTANEOUS
  Filled 2016-11-07 (×6): qty 0.3

## 2016-11-07 MED ORDER — ACETAMINOPHEN 325 MG PO TABS
650.0000 mg | ORAL_TABLET | ORAL | Status: DC | PRN
  Administered 2016-11-08: 650 mg via ORAL
  Filled 2016-11-07: qty 2

## 2016-11-07 MED ORDER — ACETAMINOPHEN 650 MG RE SUPP: 650.0000 mg | RECTAL | Status: DC | PRN

## 2016-11-07 MED ORDER — ASPIRIN 325 MG PO TABS
325.0000 mg | ORAL_TABLET | Freq: Every day | ORAL | Status: DC
  Administered 2016-11-07 – 2016-11-09 (×3): 325 mg via ORAL
  Filled 2016-11-07 (×3): qty 1

## 2016-11-07 MED ORDER — LIVING WELL WITH DIABETES BOOK - IN SPANISH
Freq: Once | Status: AC
  Administered 2016-11-07: 11:00:00
  Filled 2016-11-07: qty 1

## 2016-11-07 MED ORDER — GABAPENTIN 300 MG PO CAPS
600.0000 mg | ORAL_CAPSULE | Freq: Every day | ORAL | Status: DC
  Administered 2016-11-07 – 2016-11-08 (×2): 600 mg via ORAL
  Filled 2016-11-07 (×2): qty 2

## 2016-11-07 MED ORDER — STROKE: EARLY STAGES OF RECOVERY BOOK
Freq: Once | Status: AC
  Administered 2016-11-08
  Filled 2016-11-07: qty 1

## 2016-11-07 MED ORDER — HYDROCODONE-ACETAMINOPHEN 5-325 MG PO TABS
1.0000 | ORAL_TABLET | Freq: Four times a day (QID) | ORAL | Status: DC | PRN
Start: 2016-11-08 — End: 2016-11-08

## 2016-11-07 MED ORDER — HYDRALAZINE HCL 20 MG/ML IJ SOLN: 10.0000 mg | Freq: Four times a day (QID) | INTRAMUSCULAR | Status: DC | PRN

## 2016-11-07 MED ORDER — HYDROCODONE-ACETAMINOPHEN 5-325 MG PO TABS
2.0000 | ORAL_TABLET | Freq: Once | ORAL | Status: AC
  Administered 2016-11-07: 2 via ORAL
  Filled 2016-11-07: qty 2

## 2016-11-07 MED ORDER — INSULIN ASPART 100 UNIT/ML ~~LOC~~ SOLN
0.0000 [IU] | SUBCUTANEOUS | Status: DC
  Administered 2016-11-07: 11 [IU] via SUBCUTANEOUS
  Administered 2016-11-07: 15 [IU] via SUBCUTANEOUS
  Administered 2016-11-08 (×2): 3 [IU] via SUBCUTANEOUS
  Administered 2016-11-08: 7 [IU] via SUBCUTANEOUS
  Administered 2016-11-08: 11 [IU] via SUBCUTANEOUS
  Administered 2016-11-08: 7 [IU] via SUBCUTANEOUS
  Administered 2016-11-09: 4 [IU] via SUBCUTANEOUS
  Administered 2016-11-09 (×2): 3 [IU] via SUBCUTANEOUS

## 2016-11-07 MED ORDER — INSULIN ASPART 100 UNIT/ML ~~LOC~~ SOLN
8.0000 [IU] | Freq: Once | SUBCUTANEOUS | Status: AC
  Administered 2016-11-07: 8 [IU] via SUBCUTANEOUS
  Filled 2016-11-07: qty 1

## 2016-11-07 MED ORDER — SODIUM CHLORIDE 0.9 % IV SOLN
INTRAVENOUS | Status: DC
  Administered 2016-11-07: 13:00:00 via INTRAVENOUS

## 2016-11-07 MED ORDER — ACETAMINOPHEN 160 MG/5ML PO SOLN: 650.0000 mg | ORAL | Status: DC | PRN

## 2016-11-07 MED ORDER — ENOXAPARIN SODIUM 40 MG/0.4ML ~~LOC~~ SOLN
40.0000 mg | SUBCUTANEOUS | Status: DC
  Administered 2016-11-07 – 2016-11-08 (×2): 40 mg via SUBCUTANEOUS
  Filled 2016-11-07 (×2): qty 0.4

## 2016-11-07 MED ORDER — ATORVASTATIN CALCIUM 80 MG PO TABS
80.0000 mg | ORAL_TABLET | Freq: Every day | ORAL | Status: DC
  Administered 2016-11-07 – 2016-11-09 (×3): 80 mg via ORAL
  Filled 2016-11-07: qty 1
  Filled 2016-11-07: qty 2
  Filled 2016-11-07: qty 1

## 2016-11-07 NOTE — Progress Notes (Signed)
STROKE TEAM PROGRESS NOTE   HISTORY OF PRESENT ILLNESS (per record) Meghan Ford is an 76 y.o. Spanish-speaking female with a history of uncontrolled diabetes mellitus, hypertension, hyperlipidemia who presented with left facial droop and left sided weakness when she woke up on the morning of 11/06/2016.  She also noticed numbness and weakness of her left arm and leg that persisted for several hours which prompted her to come to the ER. However she states her symptoms have improved gradually over the day and now feels like she is back to her baseline.  She takes aspirin daily. She lives with 2 roommates and states that she takes her medications regularly.  Date last known well: 8.26.18  Time last known well: 21.00  NIHSS 0  MRS baseline 0  Patient was not administered IV t-PA secondary to arriving outside of the tPA treatment window. She was admitted to General Neurology for further evaluation and treatment.    SUBJECTIVE (INTERVAL HISTORY) No family members at the bedside.  The patient is drowsy sleepy, sitting up in the chair asking for water.  She follows all commands appropriately but with language barrier. Hgba1C 13.0.  Diabetes Coordinator following.   OBJECTIVE Temp:  [97.7 F (36.5 C)-98.9 F (37.2 C)] 97.7 F (36.5 C) (08/29 1406) Pulse Rate:  [65-82] 73 (08/29 1406) Cardiac Rhythm: Normal sinus rhythm (08/29 0700) Resp:  [16-18] 16 (08/29 1406) BP: (121-158)/(58-66) 140/65 (08/29 1406) SpO2:  [94 %-98 %] 96 % (08/29 1406)  CBC:   Recent Labs Lab 11/06/16 2050 11/06/16 2104  WBC 8.2  --   NEUTROABS 4.9  --   HGB 14.3 16.0*  HCT 44.4 47.0*  MCV 80.1  --   PLT 218  --     Basic Metabolic Panel:   Recent Labs Lab 11/06/16 2050 11/06/16 2104  NA 132* 133*  K 4.0 4.1  CL 97* 96*  CO2 27  --   GLUCOSE 452* 452*  BUN 29* 32*  CREATININE 1.02* 1.00  CALCIUM 9.0  --     Lipid Panel:     Component Value Date/Time   CHOL 123 11/08/2016 0803    TRIG 123 11/08/2016 0803   HDL 44 11/08/2016 0803   CHOLHDL 2.8 11/08/2016 0803   VLDL 25 11/08/2016 0803   LDLCALC 54 11/08/2016 0803   HgbA1c:  Lab Results  Component Value Date   HGBA1C 13.0 (H) 11/08/2016   Urine Drug Screen:     Component Value Date/Time   LABOPIA NONE DETECTED 11/07/2016 1445   COCAINSCRNUR NONE DETECTED 11/07/2016 1445   LABBENZ NONE DETECTED 11/07/2016 1445   AMPHETMU NONE DETECTED 11/07/2016 1445   THCU NONE DETECTED 11/07/2016 1445   LABBARB NONE DETECTED 11/07/2016 1445    Alcohol Level No results found for: ETH  IMAGING I have personally reviewed the radiological images below and agree with the radiology interpretations.  Ct Head Wo Contrast 11/06/2016 IMPRESSION: No CT evidence for acute intracranial abnormality.  Mr Maxine Glenn Neck W Wo Contrast Mr Laqueta Jean Wo Contrast Mr Maxine Glenn Head Wo Contrast 11/07/2016 IMPRESSION: 1. Patchy enhancing signal abnormality around the right lateral ventricle consistent with deep watershed subacute infarcts in this setting. If progressive symptoms repeat with contrast recommended. 2. High-grade right M1 and M2 segment stenoses. Advanced right proximal PCA disease with no visible right PCA flow. 3. Multifocal vertebrobasilar stenosis, mild at the left V4 segment, moderate at the proximal basilar, and advanced at the distal basilar. Right PICA stenosis. 4. No cervical carotid stenosis.  5. Right V1 and V2 segment stenoses, at least moderate at the origin.   TTE 11/07/2016 Study Conclusions - Left ventricle: The cavity size was normal. Wall thickness was increased in a pattern of mild LVH. Systolic function was normal.  The estimated ejection fraction was in the range of 60% to 65%.  Wall motion was normal; there were no regional wall motion abnormalities. Doppler parameters are consistent with abnormal left ventricular relaxation (grade 1 diastolic dysfunction). - Aortic valve: Trileaflet; mildly thickened, mildly calcified  leaflets. Impressions: - No cardiac source of emboli was indentified.  Carotid US 11/07/2016 Summary: Findings suggest 1-39% internal carotid artery stenosis bilaterally. Vertebral arteries are patent with antegrade flow.   PHYSICAL EXAM  Temp:  [97.7 F (36.5 C)-98.9 F (37.2 C)] 97.7 F (36.5 C) (08/29 1406) Pulse Rate:  [65-82] 73 (08/29 1406) Resp:  [16-18] 16 (08/29 1406) BP: (121-158)/(58-66) 140/65 (08/29 1406) SpO2:  [94 %-98 %] 96 % (08/29 1406)  General - Well nourished, well developed, drowsy sleepy.  Ophthalmologic - Fundi not visualized due to noncooperation.  Cardiovascular - Regular rate and rhythm.  Neuro - drowsy, sleepy, but able to arouse. Pt spanish-speaking, answered all orientation questions correctly. However, mild to moderate dysarthria. No significant aphasia but not cooperative on language testing. Eyes close but on forced opening, no eyes deviation, able to move all directions, PERRL, blinking to visual threat bilaterally. Left nasolabial fold flattening, tongue mid line. Moving all extremities equally and symmetrically, DTR 1+ and no babinski. Sensation, coordination and gait not tested.   ASSESSMENT/PLAN [name] is a 76 y.o. female with a history of uncontrolled diabetes mellitus, hypertension, hyperlipidemia who presented with left facial droop and left sided weakness when she woke up on the morning of 11/06/2016. She did not receive IV t-PA due to presenting outside of the treatment window.   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 No source of embolus  LDL 54  HgbA1c 13  Lovenox 40 mg sq daily for VTE prophylaxis Diet Carb Modified Fluid consistency: Thin; Room service appropriate? Yes  aspirin 325 mg daily prior to admission, now on aspirin 325 mg daily and clopidogrel 75 mg daily. Continue DAPT for 3 months and then plavix alone due to symptomatic intracranial stenosis  Patient counseled to be compliant with her antithrombotic medications  Ongoing aggressive stroke risk factor management  Therapy recommendations:  pending  Disposition:  Pending  Multifocal intracranial stenosis  MRA head: high-grade R M1 and R M2 stenosis, left VA and right PCA and BA stenosis  Risk factor including HTN, uncontrolled DM, HLD on lipitor and obesity  On DAPT, continue for 3 months and then plavix alone  BP goal 130-150  Hypertension  Stable  Permissive hypertension (OK if < 220/120) but gradually normalize in 5-7 days  Long-term BP goal 130-150 due to multifocal intracranial stenosis  Hyperlipidemia  Home meds: atorvastatin 80mg  PO daily, resumed in hospital  LDL 54, goal < 70  Continue  statin at discharge  Diabetes  HgbA1c 13, goal < 7.0  Uncontrolled  Diabetes Coordinator following  SSI  On lantus  Close PCP follow up  Other Stroke Risk Factors  Advanced age  Borderline morbid obesity, Body mass index is 39.31 kg/m., recommend weight loss, diet and exercise as appropriate   PVD  Other Active Problems  Spanish speaking  Mild hyponatremia  Hospital day # 0  Neurology will sign off. Please call with questions. Pt will follow up with Darrol Angel, NP, at [facility] in about 6 weeks. Thanks for the consult.  Marvel Plan, MD PhD Stroke Neurology 11/08/2016 10:55 PM    To contact Stroke Continuity provider, please refer to [email or web address]. After hours,  contact General Neurology

## 2016-11-07 NOTE — ED Provider Notes (Signed)
[facility]-EMERGENCY DEPT Provider Note   CSN: [id number] Arrival date & time: 11/06/16  2002     History   Chief Complaint Chief Complaint  Patient presents with  . Weakness  . Numbness    HPI Meghan Ford is a 76 y.o. female.  Patient reportedly had an episode earlier this morning where the left side of her face, left arm, left leg became numb, tingly. She had weakness of the left extremities and drooping of her face. This lasted only several minutes and then slowly improved. At time of evaluation in the ER, symptoms are completely resolved.      Past Medical History:  Diagnosis Date  . Diabetes mellitus type II, uncontrolled (HCC)   . Diabetes mellitus without complication (HCC)   . Fx humeral neck   . Hyperlipidemia LDL goal < 100   . Hypertension   . Leg pain    worse with prolonged standing  . Peripheral vascular disease (HCC)   . Varicose veins     Patient Active Problem List   Diagnosis Date Noted  . Noncompliance with diet and medication regimen 08/26/2015  . S/p reverse total shoulder arthroplasty   . Leukocytosis   . Proximal humerus fracture 10/26/2014  . Hyperlipidemia 10/23/2014  . Hypokalemia 10/09/2014  . Essential hypertension 10/09/2014  . Protein-calorie malnutrition, severe (HCC) 09/29/2014  . Sepsis (HCC) 09/28/2014  . Fever 09/28/2014  . Headache 09/28/2014  . Diabetes mellitus with neuropathy (HCC)   . Pyrexia   . Uncontrolled hypertension 10/15/2012  . Neuropathic pain of both legs 10/15/2012  . Palpitations 05/21/2012  . Dyslipidemia 05/15/2012  . Hypertension   . Hyperlipidemia LDL goal < 100   . Diabetes mellitus type II, uncontrolled (HCC)   . Varicose veins of lower extremities with other complications 11/07/2011    Past Surgical History:  Procedure Laterality Date  . OTHER SURGICAL HISTORY     reports varicose vein procedure  . REVERSE SHOULDER ARTHROPLASTY Left 10/27/2014   Procedure: REVERSE SHOULDER  ARTHROPLASTY;  Surgeon: Sheral Apley, MD;  Location: [facility] OR;  Service: Orthopedics;  Laterality: Left;  . TOTAL SHOULDER ARTHROPLASTY Left 10/27/2014   Procedure: TOTAL SHOULDER ARTHROPLASTY;  Surgeon: Sheral Apley, MD;  Location: [facility] OR;  Service: Orthopedics;  Laterality: Left;  Marland Kitchen VEIN SURGERY     left leg    OB History    Gravida Para Term Preterm AB Living   0 0 0 0 0     SAB TAB Ectopic Multiple Live Births   0 0 0           Home Medications    Prior to Admission medications   Medication Sig Start Date End Date Taking? Authorizing Provider  amLODipine (NORVASC) 10 MG tablet Take 1 tablet (10 mg total) by mouth daily. 10/04/16   Jaclyn Shaggy, MD  aspirin 325 MG tablet Take 1 tablet (325 mg total) by mouth daily. 10/27/14   Janalee Dane, PA-C  atorvastatin (LIPITOR) 80 MG tablet Take 1 tablet (80 mg total) by mouth daily. 10/04/16   Jaclyn Shaggy, MD  gabapentin (NEURONTIN) 300 MG capsule Take 2 capsules (600 mg total) by mouth at bedtime. 10/04/16   Jaclyn Shaggy, MD  glucose blood test strip Use as instructed 05/14/15   Ambrose Finland, NP  ibuprofen (ADVIL,MOTRIN) 600 MG tablet Take 1 tablet (600 mg total) by mouth every 6 (six) hours as needed for pain (por dolor en espalda.  Toma con comida.). 10/15/12   Hyman Hopes,  Olugbemiga E, MD  insulin glargine (LANTUS) 100 UNIT/ML injection Inject 0.3 mLs (30 Units total) into the skin 2 (two) times daily. 10/04/16   Jaclyn Shaggy, MD  isosorbide mononitrate (IMDUR) 60 MG 24 hr tablet Take 1 tablet (60 mg total) by mouth daily. 10/04/16   Jaclyn Shaggy, MD  losartan-hydrochlorothiazide (HYZAAR) 100-25 MG tablet Take 1 tablet by mouth daily. 10/04/16   Jaclyn Shaggy, MD  TRUEPLUS LANCETS 28G MISC 1 each by Does not apply route 3 (three) times daily before meals. 08/25/15   Jaclyn Shaggy, MD    Family History Family History  Problem Relation Age of Onset  . Cancer Brother   . Heart disease Sister     Social History Social History    Substance Use Topics  . Smoking status: Never Smoker  . Smokeless tobacco: Never Used  . Alcohol use No     Allergies   Patient has no known allergies.   Review of Systems Review of Systems  Neurological: Positive for weakness, numbness and headaches.  All other systems reviewed and are negative.    Physical Exam Updated Vital Signs BP (!) 131/59   Pulse 69   Temp 98.7 F (37.1 C) (Oral)   Resp 18   Ht 5\' 4"  (1.626 m)   Wt 103.9 kg (229 lb)   SpO2 100%   BMI 39.31 kg/m   Physical Exam  Constitutional: She is oriented to person, place, and time. She appears well-developed and well-nourished. No distress.  HENT:  Head: Normocephalic and atraumatic.  Right Ear: Hearing normal.  Left Ear: Hearing normal.  Nose: Nose normal.  Mouth/Throat: Oropharynx is clear and moist and mucous membranes are normal.  Eyes: Pupils are equal, round, and reactive to light. Conjunctivae and EOM are normal.  Neck: Normal range of motion. Neck supple.  Cardiovascular: Regular rhythm, S1 normal and S2 normal.  Exam reveals no gallop and no friction rub.   No murmur heard. Pulmonary/Chest: Effort normal and breath sounds normal. No respiratory distress. She exhibits no tenderness.  Abdominal: Soft. Normal appearance and bowel sounds are normal. There is no hepatosplenomegaly. There is no tenderness. There is no rebound, no guarding, no tenderness at McBurney's point and negative Murphy's sign. No hernia.  Musculoskeletal: Normal range of motion.  Neurological: She is alert and oriented to person, place, and time. She has normal strength. No cranial nerve deficit or sensory deficit. Coordination normal. GCS eye subscore is 4. GCS verbal subscore is 5. GCS motor subscore is 6.  Skin: Skin is warm, dry and intact. No rash noted. No cyanosis.  Psychiatric: She has a normal mood and affect. Her speech is normal and behavior is normal. Thought content normal.  Nursing note and vitals  reviewed.    ED Treatments / Results  Labs (all labs ordered are listed, but only abnormal results are displayed) Labs Reviewed  CBC - Abnormal; Notable for the following:       Result Value   RBC 5.54 (*)    MCH 25.8 (*)    All other components within normal limits  COMPREHENSIVE METABOLIC PANEL - Abnormal; Notable for the following:    Sodium 132 (*)    Chloride 97 (*)    Glucose, Bld 452 (*)    BUN 29 (*)    Creatinine, Ser 1.02 (*)    GFR calc non Af Amer 53 (*)    All other components within normal limits  I-STAT CHEM 8, ED - Abnormal; Notable for the following:  Sodium 133 (*)    Chloride 96 (*)    BUN 32 (*)    Glucose, Bld 452 (*)    Calcium, Ion 1.13 (*)    Hemoglobin 16.0 (*)    HCT 47.0 (*)    All other components within normal limits  PROTIME-INR  APTT  DIFFERENTIAL  I-STAT TROPONIN, ED  CBG MONITORING, ED    EKG  EKG Interpretation None       Radiology Ct Head Wo Contrast  Result Date: 11/06/2016 CLINICAL DATA:  Numbness and weakness to the left side EXAM: CT HEAD WITHOUT CONTRAST TECHNIQUE: Contiguous axial images were obtained from the base of the skull through the vertex without intravenous contrast. COMPARISON:  None. FINDINGS: Brain: No evidence of acute infarction, hemorrhage, hydrocephalus, extra-axial collection or mass lesion/mass effect. Moderate atrophy. 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 Vascular: No hyperdense vessels. Scattered calcifications at the carotid siphons. Skull: Normal. Negative for fracture or focal lesion. Sinuses/Orbits: Mild mucosal thickening in the ethmoid sinuses. No acute orbital abnormality Other: None IMPRESSION: No CT evidence for acute intracranial abnormality. Electronically Signed   By: Jasmine Pang M.D.   On: 11/06/2016 22:14    Procedures Procedures (including critical care time)  Medications Ordered in ED Medications - No data to display   Initial Impression / Assessment and  Plan / ED Course  I have reviewed the triage vital signs and the nursing notes.  Pertinent labs & imaging results that were available during my care of the patient were reviewed by me and considered in my medical decision making (see chart for details).     Patient presents with episode earlier today where the left side of her face, left arm, left leg became numb, tingly and weak. Symptoms spontaneously resolved and have not recurred. She does have multiple risk factors for cardiovascular/cerebrovascular disease, but no known history.  Patient's symptoms resolved and she has a completely normal, nonfocal neurologic exam currently. I recommended hospitalization for TIA workup. Initially patient declined. I did have Dr. Laurence Slate, neurology see the patient. He was able to convince her to stay in the hospital for further evaluation.  Final Clinical Impressions(s) / ED Diagnoses   Final diagnoses:  Transient cerebral ischemia, unspecified type    New Prescriptions New Prescriptions   No medications on file     Gilda Crease, MD 11/07/16 [phone]

## 2016-11-07 NOTE — Progress Notes (Signed)
Inpatient Diabetes Program Recommendations  AACE/ADA: New Consensus Statement on Inpatient Glycemic Control (2015)  Target Ranges:  Prepandial:   less than 140 mg/dL      Peak postprandial:   less than 180 mg/dL (1-2 hours)      Critically ill patients:  140 - 180 mg/dL  Results for Meghan Ford, Meghan Ford (MRN [id number]) as of 11/07/2016 13:57  Ref. Range 11/07/2016 05:31 11/07/2016 11:41  Glucose-Capillary Latest Ref Range: 65 - 99 mg/dL 332 (H) 951 (H)   Results for Meghan Ford, Meghan Ford (MRN [id number]) as of 11/07/2016 13:57  Ref. Range 11/07/2016 07:15  Hemoglobin A1C Latest Ref Range: 4.8 - 5.6 % 13.1 (H)   Review of Glycemic Control  Diabetes history: DM2 Outpatient Diabetes medications: Lantus 30 units BID Current orders for Inpatient glycemic control: Lantus 30 units BID, Novolog 0-20 units Q4H  Inpatient Diabetes Program Recommendations: HgbA1C: A1C 13.1% on 11/07/16 indicating an average glucose of 329 mg/dl over the past 2-3 months.  NOTE: Will follow glucose trends on current orders to determine if insulin adjustments need to be made. Spoke with patient about diabetes and home regimen for diabetes control using [place] (Spanish interpreter). Patient is lying in bed and closes eyes from time to time as she reported that she is tired. Patient reports that she is followed by [facility] for diabetes management and initially she reported that she takes Lantus (vial) 15 units QAM and Lantus QPM (for a total of 35 units per day) as an outpatient for diabetes control. Inquired about why she wasn't taking prescribed Lantus 30 units BID (as noted from clinic visit on 10/04/16) and she then stated that she was taking Lantus 30 units BID.   Patient reports that she is taking insulin as prescribed; however she reports that she last took Lantus on Saturday because she "ran out of needles". Patient states that she usually checks her glucose daily but she  reports that she is out of lancets so she has not checked her glucose in the past 2 days. Inquired about what glucose and A1C goals were and patient stated "I don't know." Patient then became tearful and stated that she was tired and that she has had so many tests today and she needs to rest. Explained that I needed to talk with her more about DM and importance of control but patient remained tearful with eyes closed. Provided patient with Living Well With Diabetes booklet (in Spanish) and asked patient to read book before tomorrow for further discussion about DM and importance of DM control. Informed patient that Diabetes Coordinator would come back tomorrow to talk with her again.  Patient verbalized understanding of information discussed. Diabetes Coordinator will attempt to talk with patient again tomorrow with translator.  At time of discharge, patient will need lancets and insulin syringes.  Thanks, Orlando Penner, RN, MSN, CDE Diabetes Coordinator Inpatient Diabetes Program [phone] (Team Pager)

## 2016-11-07 NOTE — Progress Notes (Signed)
Patient arrived to unit.  Alert, verbally pleasant.  No noted distress or discomfort.  Resting in room chair with call light in reach.  Family at chair side, supportive.

## 2016-11-07 NOTE — ED Notes (Signed)
To MRI with transporter

## 2016-11-07 NOTE — Evaluation (Signed)
Physical Therapy Evaluation Patient Details Name: Meghan Ford MRN: [id number] DOB: [birth year] Today's Date: 11/07/2016   History of Present Illness  76 y.o. female past medical history of uncontrolled diabetes mellitus, hypertension, hyperlipidemia who presents with left facial droop and left sided weakness when she woke up; neuro work up underway  Clinical Impression  Orders received for PT evaluation. Patient demonstrates deficits in functional mobility as indicated below. Will benefit from continued skilled PT to address deficits and maximize function. Will see as indicated and progress as tolerated.  Recommending HHPT for mobility and safety evaluation in the home.     Follow Up Recommendations Home health PT;Supervision for mobility/OOB (HHPT for in home safety evaluation)    Equipment Recommendations  None recommended by PT    Recommendations for Other Services       Precautions / Restrictions Precautions Precautions: Fall      Mobility  Bed Mobility Overal bed mobility: Modified Independent                Transfers Overall transfer level: Needs assistance Equipment used: None Transfers: Sit to/from Stand Sit to Stand: Supervision         General transfer comment: no physical assist, supervision for safety  Ambulation/Gait Ambulation/Gait assistance: Supervision Ambulation Distance (Feet): 180 Feet Assistive device: None Gait Pattern/deviations: Step-through pattern;Drifts right/left;Narrow base of support Gait velocity: decreased Gait velocity interpretation: Below normal speed for age/gender General Gait Details: modest instbaility noted, increased LOB when attempting head turns with mobility tasks  Stairs            Wheelchair Mobility    Modified Rankin (Stroke Patients Only)       Balance Overall balance assessment: Needs assistance                           High level balance activites: Direction  changes;Turns;Head turns High Level Balance Comments: min assist for stability with head turns             Pertinent Vitals/Pain      Home Living Family/patient expects to be discharged to:: Private residence Living Arrangements: Alone Available Help at Discharge: Family;Available PRN/intermittently Type of Home: House Home Access: Stairs to enter Entrance Stairs-Rails: Can reach both Entrance Stairs-Number of Steps: 4   Home Equipment:  (walking stick)      Prior Function Level of Independence: Independent               Hand Dominance   Dominant Hand: Right    Extremity/Trunk Assessment   Upper Extremity Assessment Upper Extremity Assessment: Defer to OT evaluation    Lower Extremity Assessment Lower Extremity Assessment: Overall WFL for tasks assessed       Communication   Communication: Prefers language other than English  Cognition Arousal/Alertness: Awake/alert Behavior During Therapy: WFL for tasks assessed/performed Overall Cognitive Status: Difficult to assess                                        General Comments      Exercises     Assessment/Plan    PT Assessment Patient needs continued PT services  PT Problem List Decreased strength;Decreased activity tolerance;Decreased balance;Decreased mobility       PT Treatment Interventions DME instruction;Gait training;Stair training;Functional mobility training;Therapeutic activities;Therapeutic exercise;Balance training;Patient/family education    PT Goals (Current goals can be found in  the Care Plan section)  Acute Rehab PT Goals Patient Stated Goal: to go home PT Goal Formulation: With family Time For Goal Achievement: 11/21/16 Potential to Achieve Goals: Good    Frequency Min 3X/week   Barriers to discharge Decreased caregiver support      Co-evaluation               AM-PAC PT "6 Clicks" Daily Activity  Outcome Measure Difficulty turning over in bed  (including adjusting bedclothes, sheets and blankets)?: A Little Difficulty moving from lying on back to sitting on the side of the bed? : A Little Difficulty sitting down on and standing up from a chair with arms (e.g., wheelchair, bedside commode, etc,.)?: A Little Help needed moving to and from a bed to chair (including a wheelchair)?: A Little Help needed walking in hospital room?: A Little Help needed climbing 3-5 steps with a railing? : A Little 6 Click Score: 18    End of Session Equipment Utilized During Treatment: Gait belt Activity Tolerance: Patient tolerated treatment well Patient left: in bed;with call bell/phone within reach;with family/visitor present Nurse Communication: Mobility status PT Visit Diagnosis: Unsteadiness on feet (R26.81)    Time: 8891-6945 PT Time Calculation (min) (ACUTE ONLY): 21 min   Charges:   PT Evaluation $PT Eval Moderate Complexity: 1 Mod     PT G Codes:        Charlotte Crumb, PT DPT  Board Certified Neurologic Specialist [phone]   Fabio Asa 11/07/2016, 5:05 PM

## 2016-11-07 NOTE — ED Notes (Signed)
Pt ambulatory to restroom

## 2016-11-07 NOTE — Progress Notes (Signed)
Patient informed this nurse she had just taken her personal medication from handbag.  Patient educated on personal use of medication while in hospital.  Medications to be sent home with son.

## 2016-11-07 NOTE — Progress Notes (Signed)
  Echocardiogram 2D Echocardiogram has been performed.  Meghan Ford 11/07/2016, 11:30 AM

## 2016-11-07 NOTE — Consult Note (Signed)
Requesting Physician: Dr. Blinda Leatherwood    Chief Complaint: Left side weakness  History obtained from:  Patient via interpreter and her children who speak english   HPI:                                                                                                                                       Meghan Ford is an 76 y.o. female past medical history of uncontrolled diabetes mellitus, hypertension, hyperlipidemia who presents with left facial droop and left sided weakness when she woke up this morning.  Her last known normal was 9 PM last night when she went to bed and upon waking up she noticed that left side of her face was drooping. She also noticed numbness and weakness of her left arm and leg that persisted for several hours which prompted her to come to the ER. However she states her symptoms have improved gradually over the day and now feels like she is back to her baseline.  She takes aspirin daily. She lives with 2 roommates and states that she takes her medications regularly  Date last known well: 8.26.18  Time last known well: 21.00  tPA Given: No outside of window  NIHSS 0  MRS baseline 0  Past Medical History:  Diagnosis Date  . Diabetes mellitus type II, uncontrolled (HCC)   . Diabetes mellitus without complication (HCC)   . Fx humeral neck   . Hyperlipidemia LDL goal < 100   . Hypertension   . Leg pain    worse with prolonged standing  . Peripheral vascular disease (HCC)   . Varicose veins     Past Surgical History:  Procedure Laterality Date  . OTHER SURGICAL HISTORY     reports varicose vein procedure  . REVERSE SHOULDER ARTHROPLASTY Left 10/27/2014   Procedure: REVERSE SHOULDER ARTHROPLASTY;  Surgeon: Sheral Apley, MD;  Location: [facility] OR;  Service: Orthopedics;  Laterality: Left;  . 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 Cancer Brother   . Heart disease Sister    Social History:  reports that she has never smoked. She has never used smokeless tobacco. She reports that she does not drink alcohol or use drugs.  Allergies: No Known Allergies  Medications:  I reviewed  the chart for her home medications   ROS:                                                                                                                                       14 point review of systems negative   Examination:                                                                                                      General: Appears well-developed and well-nourished.  Psych: Affect appropriate to situation Eyes: No scleral injection HENT: No OP obstrucion Head: Normocephalic.  Cardiovascular: Normal rate and regular rhythm.  Respiratory: Effort normal and breath sounds normal to anterior ascultation GI: Soft.  No distension. There is no tenderness.  Skin: WDI   Neurological Examination Mental Status: Alert, oriented, thought content appropriate.  Speech fluent without evidence of aphasia.  Able to follow 3 step commands without difficulty. Cranial Nerves: II: Discs flat bilaterally; Visual fields grossly normal,  III,IV, VI: ptosis not present, extra-ocular motions intact bilaterally, pupils equal, round, reactive to light and accommodation V,VII: smile symmetric, facial light touch sensation normal bilaterally VIII: hearing normal bilaterally IX,X: uvula rises symmetrically XI: bilateral shoulder shrug XII: midline tongue extension Motor: Right : Upper extremity   5/5    Left:     Upper extremity   5/5  Lower extremity   5/5     Lower extremity   5/5 Tone and bulk:normal tone throughout; no atrophy noted Sensory: Pinprick and light  touch intact throughout, bilaterally Deep Tendon Reflexes: 2+ and symmetric throughout Plantars: Right: downgoing   Left: downgoing Cerebellar: normal finger-to-nose, normal rapid alternating movements and normal heel-to-shin test Gait: normal gait and station     Lab Results: Basic Metabolic Panel:  Recent Labs Lab 11/06/16 2050 11/06/16 2104  NA 132* 133*  K 4.0 4.1  CL 97* 96*  CO2 27  --   GLUCOSE 452* 452*  BUN 29* 32*  CREATININE 1.02* 1.00  CALCIUM 9.0  --     CBC:  Recent Labs Lab 11/06/16 2050 11/06/16 2104  WBC 8.2  --   NEUTROABS 4.9  --   HGB 14.3 16.0*  HCT 44.4 47.0*  MCV 80.1  --   PLT 218  --     Coagulation Studies:  Recent Labs  11/06/16 2050  LABPROT 13.2  INR 1.00    Imaging: Ct Head Wo Contrast  Result Date: 11/06/2016 CLINICAL DATA:  Numbness  and weakness to the left side EXAM: CT HEAD WITHOUT CONTRAST TECHNIQUE: Contiguous axial images were obtained from the base of the skull through the vertex without intravenous contrast. COMPARISON:  None. FINDINGS: Brain: No evidence of acute infarction, hemorrhage, hydrocephalus, extra-axial collection or mass lesion/mass effect. Moderate atrophy. 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 Vascular: No hyperdense vessels. Scattered calcifications at the carotid siphons. Skull: Normal. Negative for fracture or focal lesion. Sinuses/Orbits: Mild mucosal thickening in the ethmoid sinuses. No acute orbital abnormality Other: None IMPRESSION: No CT evidence for acute intracranial abnormality. Electronically Signed   By: Jasmine Pang M.D.   On: 11/06/2016 22:14     ASSESSMENT AND PLAN   76 year old female with multiple vascular risk factors presents with left-sided facial droop, arm and leg weakness that gradually improved. Likely patient has a small lacunar infarct versus TIA. She has a high ABCD 2 score and risk of another she make event and highly recommended admission for  workup for risk factors.  Acute Ischemic Stroke   Risk factors: obesity, HTN, HLD, DM Etiology: needs to evaluation, most likely lacunar  Recommend # MRI of the brain without contrast #MRA Head and neck  #Transthoracic Echo  # Continue ASA, will defer to stroke team who may consider switching to Plavix  #Start or continue Atorvastatin 80 mg/other high intensity statin # BP goal: permissive HTN upto 210 systolic, PRNs above 21 # HBAIC and Lipid profile # Telemetry monitoring # Frequent neuro checks # NPO until passes stroke swallow screen  Please page stroke NP  Or  PA  Or MD from 8am -4 pm  as this patient from this time will be  followed by the stroke.   You can look them up on [email or web address]  Password [facility]    Georgiana Spinner Zakiyyah Savannah MD [facility] [phone]  If 7pm to 7am, please call on call as listed on AMION.

## 2016-11-07 NOTE — ED Notes (Signed)
Patient transported to MRI 

## 2016-11-07 NOTE — ED Notes (Signed)
CBG [id number] 

## 2016-11-07 NOTE — Discharge Instructions (Signed)
Schedule follow-up with your primary doctor in one of the neurology groups listed above as soon as possible. Return to the ER immediately if you have any recurrent symptoms.  Programe un seguimiento con su mdico de cabecera en uno de los grupos de neurologa mencionados anteriormente tan pronto como sea posible. Regrese a la sala de urgencias inmediatamente si tiene algn sntoma recurrente.

## 2016-11-07 NOTE — H&P (Signed)
History and Physical    Meghan Ford ZOX:[id number] DOB: 03/12/1941 DOA: 11/07/2016   PCP: Jaclyn Shaggy, MD   Attending physician: Konrad Dolores  Patient coming from/Resides with: Private residence  Chief Complaint: Left-sided TIA symptoms  HPI: Meghan Ford is a 76 y.o. female with medical history significant for noncompliance with medical therapies, obesity, hypertension, dyslipidemia, and diabetes on insulin. She presented to the ER yesterday evening with complaints of left-sided weakness and numbness associated with headache and left facial numbness that began at 9:00 on day morning but had resolved by the time she arrived to the ER. She has been evaluated by neurology who recommends a TIA evaluation.   Family bedside assisting with translation. History further clarified as follows: Symptoms began at 9 AM, facial drooping and numbness resolved within one hour. Extremity symptoms persisted for most of the day with residual fingertip numbness present around 4 PM yesterday with no further recurrence of symptoms since that time.  ED Course:  Vital Signs: BP (!) 153/75   Pulse 67   Temp 98.7 F (37.1 C) (Oral)   Resp 16   Ht 5\' 4"  (1.626 m)   Wt 103.9 kg (229 lb)   SpO2 98%   BMI 39.31 kg/m  CT head w/o contrast: No acute intracranial abnormality Lab data: Sodium 132, potassium 4.0, chloride 97, CO2 27, glucose 452, anion gap 8, BUN 29, creatinine 1.02, LFTs normal, poc troponin 0.00, white count 8200 with normal differential, hemoglobin 14.3, platelets 218,000, coags normal. Medications and treatments: None  Review of Systems:  In addition to the HPI above,  No Fever-chills, myalgias or other constitutional symptoms No changes with Vision or hearing, dizziness, dysarthria or word finding difficulty, gait disturbance or imbalance, tremors or seizure activity No problems swallowing food or Liquids, indigestion/reflux, choking or coughing while eating, abdominal  pain with or after eating No Chest pain, Cough or Shortness of Breath, palpitations, orthopnea or DOE No Abdominal pain, N/V, melena,hematochezia, dark tarry stools, constipation No dysuria, malodorous urine, hematuria or flank pain No new skin rashes, lesions, masses or bruises, No new joint pains, aches, swelling or redness No recent unintentional weight gain or loss No polyuria, polydypsia or polyphagia   Past Medical History:  Diagnosis Date  . Diabetes mellitus type II, uncontrolled (HCC)   . Diabetes mellitus without complication (HCC)   . Fx humeral neck   . Hyperlipidemia LDL goal < 100   . Hypertension   . Leg pain    worse with prolonged standing  . Peripheral vascular disease (HCC)   . Varicose veins     Past Surgical History:  Procedure Laterality Date  . OTHER SURGICAL HISTORY     reports varicose vein procedure  . 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 Marital status: Single    Spouse name: N/A  . Number of children: N/A  . Years of education: N/A   Occupational History  . Not on file.   Social History Main Topics  . Smoking status: Never Smoker  . Smokeless tobacco: Never Used  . Alcohol use No  .  Drug use: No  . Sexual activity: Not on file   Other Topics Concern  . Not on file   Social History Narrative   ** Merged History Encounter **       Lives with son Bobbye Riggs who comes with her to majority of visits and helps coordinate care for her. He translates for her and has signed and patient signed a release for this on 02/02/12.           Mobility:  Walking stick Work history: Not obtained   No Known Allergies  Family History  Problem Relation Age of Onset  . Cancer Brother   . Heart disease Sister      Prior to Admission medications   Medication Sig Start Date End Date Taking? Authorizing Provider  aspirin 325 MG tablet Take 1 tablet (325 mg total) by mouth daily. 10/27/14  Yes Tresa Endo, Brittney, PA-C  atorvastatin (LIPITOR) 80 MG tablet Take 1 tablet (80 mg total) by mouth daily. 10/04/16  Yes Jaclyn Shaggy, MD  gabapentin (NEURONTIN) 300 MG capsule Take 2 capsules (600 mg total) by mouth at bedtime. 10/04/16  Yes Jaclyn Shaggy, MD  insulin glargine (LANTUS) 100 UNIT/ML injection Inject 0.3 mLs (30 Units total) into the skin 2 (two) times daily. Patient taking differently: Inject 35 Units into the skin 2 (two) times daily.  10/04/16  Yes Jaclyn Shaggy, MD  isosorbide mononitrate (IMDUR) 60 MG 24 hr tablet Take 1 tablet (60 mg total) by mouth daily. 10/04/16  Yes Jaclyn Shaggy, MD  losartan-hydrochlorothiazide (HYZAAR) 100-25 MG tablet Take 1 tablet by mouth daily. 10/04/16  Yes Jaclyn Shaggy, MD  amLODipine (NORVASC) 10 MG tablet Take 1 tablet (10 mg total) by mouth daily. Patient not taking: Reported on 11/07/2016 10/04/16   Jaclyn Shaggy, MD  glucose blood test strip Use as instructed 05/14/15   Ambrose Finland, NP  ibuprofen (ADVIL,MOTRIN) 600 MG tablet Take 1 tablet (600 mg total) by mouth every 6 (six) hours as needed for pain (por dolor en espalda.  Toma con comida.). Patient not taking: Reported on 11/07/2016 10/15/12   Quentin Angst, MD  TRUEPLUS LANCETS 28G MISC 1 each by Does not apply route 3 (three) times daily before meals. 08/25/15   Jaclyn Shaggy, MD    Physical Exam: Vitals:   11/07/16 0400 11/07/16 0445 11/07/16 0500 11/07/16 0515  BP: (!) 165/76 (!) 159/75 (!) 156/68 (!) 153/75  Pulse: 69 69 68 67  Resp: 17   16  Temp:      TempSrc:      SpO2: 99% 98% 99% 98%  Weight:      Height:          Constitutional:  NAD, calm, comfortable Eyes: PERRL, lids and conjunctivae normal,Bilateral scleral injection ENMT: Mucous membranes are moist. Posterior pharynx clear of any exudate or lesions.Normal dentition.  Neck: normal, supple, no masses, no thyromegaly Respiratory: clear to auscultation bilaterally, no wheezing, no crackles. Normal respiratory effort. No accessory muscle use.  Cardiovascular: Regular rate and rhythm, no murmurs / rubs / gallops. No extremity edema. 2+ pedal pulses. No carotid bruits.  Abdomen: no tenderness, no masses palpated. No hepatosplenomegaly. Bowel sounds positive.  Musculoskeletal: no clubbing / cyanosis. No joint deformity upper and lower extremities. Good ROM, no contractures. Normal muscle tone.  Skin: no rashes, lesions, ulcers. No induration Neurologic: CN 2-12 grossly intact. Sensation intact, DTR normal. Strength 5/5 x all 4 extremities.  Psychiatric: Normal judgment and insight. Alert and oriented x 3.  Normal mood. Exam assisted by family who was translating.   Labs on Admission: I have personally reviewed following labs and imaging studies  CBC:  Recent Labs Lab 11/06/16 2050 11/06/16 2104  WBC 8.2  --   NEUTROABS 4.9  --   HGB 14.3 16.0*  HCT 44.4 47.0*  MCV 80.1  --   PLT 218  --    Basic Metabolic Panel:  Recent Labs Lab 11/06/16 2050 11/06/16 2104  NA 132* 133*  K 4.0 4.1  CL 97* 96*  CO2 27  --   GLUCOSE 452* 452*  BUN 29* 32*  CREATININE 1.02* 1.00  CALCIUM 9.0  --    GFR: Estimated Creatinine Clearance: 58 mL/min (by C-G formula based on SCr of 1 mg/dL). Liver Function Tests:  Recent Labs Lab 11/06/16 2050  AST 19  ALT 16  ALKPHOS 104  BILITOT 0.6  PROT 6.8  ALBUMIN 3.6   No results for input(s): LIPASE, AMYLASE in the last 168 hours. No results for input(s): AMMONIA in the last 168 hours. Coagulation Profile:  Recent Labs Lab 11/06/16 2050  INR 1.00   Cardiac Enzymes: No results for input(s): CKTOTAL, CKMB,  CKMBINDEX, TROPONINI in the last 168 hours. BNP (last 3 results) No results for input(s): PROBNP in the last 8760 hours. HbA1C: No results for input(s): HGBA1C in the last 72 hours. CBG: No results for input(s): GLUCAP in the last 168 hours. Lipid Profile: No results for input(s): CHOL, HDL, LDLCALC, TRIG, CHOLHDL, LDLDIRECT in the last 72 hours. Thyroid Function Tests: No results for input(s): TSH, T4TOTAL, FREET4, T3FREE, THYROIDAB in the last 72 hours. Anemia Panel: No results for input(s): VITAMINB12, FOLATE, FERRITIN, TIBC, IRON, RETICCTPCT in the last 72 hours. Urine analysis:    Component Value Date/Time   COLORURINE YELLOW 10/27/2014 2321   APPEARANCEUR CLEAR 10/27/2014 2321   LABSPEC 1.012 10/27/2014 2321   PHURINE 7.0 10/27/2014 2321   GLUCOSEU NEGATIVE 10/27/2014 2321   HGBUR NEGATIVE 10/27/2014 2321   BILIRUBINUR negative 08/25/2015 1515   KETONESUR NEGATIVE 10/27/2014 2321   PROTEINUR 100 08/25/2015 1515   PROTEINUR NEGATIVE 10/27/2014 2321   UROBILINOGEN 1.0 08/25/2015 1515   UROBILINOGEN 0.2 10/27/2014 2321   NITRITE negative 08/25/2015 1515   NITRITE NEGATIVE 10/27/2014 2321   LEUKOCYTESUR Negative 08/25/2015 1515   Sepsis Labs: @LABRCNTIP (procalcitonin:4,lacticidven:4) )No results found for this or any previous visit (from the past 240 hour(s)).   Radiological Exams on Admission: Ct Head Wo Contrast  Result Date: 11/06/2016 CLINICAL DATA:  Numbness and weakness to the left side EXAM: CT HEAD WITHOUT CONTRAST TECHNIQUE: Contiguous axial images were obtained from the base of the skull through the vertex without intravenous contrast. COMPARISON:  None. FINDINGS: Brain: No evidence of acute infarction, hemorrhage, hydrocephalus, extra-axial collection or mass lesion/mass effect. Moderate atrophy. 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 Vascular: No hyperdense vessels. Scattered calcifications at the carotid siphons. Skull: Normal. Negative  for fracture or focal lesion. Sinuses/Orbits: Mild mucosal thickening in the ethmoid sinuses. No acute orbital abnormality Other: None IMPRESSION: No CT evidence for acute intracranial abnormality. Electronically Signed   By: Jasmine Pang M.D.   On: 11/06/2016 22:14    EKG: (Independently reviewed) sinus rhythm with ventricular rate 70 bpm, QTC 423 ms, low-voltage R waves and inferiorly with elevated J-point, poor R-wave rotation, with subtle elevation in ST segments versus J-point elevation in lateral leads as well. This is unchanged from previous EKG from 2016.  Assessment/Plan Principal Problem:  TIA (transient ischemic attack) -Patient presented to the ER with recent left-sided facial numbness/drooping associated with left side weakness and numbness that had persisted for several hours but had resolved by the time she presented -Appreciate neurology evaluation -NPO until passes bedside swallow evaluation and/or cleared by SLP -MR/MRA head and neck -Echocardiogram -HgbA1c/FLP -SLP/PT/OT evaluation -Frequent neurological checks -Continue preadmission for less aspirin  Active Problems:   Insulin dependent type 2 diabetes mellitus, uncontrolled  -CBGs consistently greater than 400 since arrival with normal anion gap -Associated pseudohyponatremia-begin normal saline at 50/hr -Follow up on A1c; HgbA1c in July elevated at 12.8 -Patient explains that when her sugars are too tightly controlled that she feels "weak and bad" and therefore doesn't always take her insulin. Had discussion via translator with patient regarding need for better control and that it is not unexpected when her sugars have been very high for quite a period of time to feel poorly as these become better adjusted. Discussed end organ diseases such as CAD, kidney failure, worsening peripheral vascular disease, stroke etc. -Diabetes coordinator consultation/nutrition consultation -Resume home Lantus -Resistant SSI -Review of  outpatient PCP visits document patient with recurrent noncompliance with medical therapies and typical fasting glucoses are greater than 200    HTN (hypertension) -Neurology recommends permissive hypertension and only treat systolic blood pressure greater than 210 or diastolic blood pressure greater than 110 -Holding home antihypertensive -Hydralazine IV prn -Echocardiogram from 2016 with preserved LV function without LVH or evidence of diastolic dysfunction    HLD (hyperlipidemia)  -Continue statin    DVT prophylaxis: Lovenox  Code Status: Full  Family Communication: Family at bedside Disposition Plan: Home Consults called: Neurology/[place]    Russella Dar ANP-BC [facility] Pager [phone]   If 7PM-7AM, please contact night-coverage [email or web address] Password [facility]  11/07/2016, 7:24 AM

## 2016-11-07 NOTE — Progress Notes (Signed)
*  PRELIMINARY RESULTS* Vascular Ultrasound Carotid Duplex (Doppler) has been completed.   Findings suggest 1-39% internal carotid artery stenosis bilaterally. Vertebral arteries are patent with antegrade flow.  11/07/2016 12:19 PM Gertie Fey, BS, RVT, RDCS, RDMS

## 2016-11-08 DIAGNOSIS — I63511 Cerebral infarction due to unspecified occlusion or stenosis of right middle cerebral artery: Principal | ICD-10-CM

## 2016-11-08 DIAGNOSIS — I679 Cerebrovascular disease, unspecified: Secondary | ICD-10-CM

## 2016-11-08 LAB — HEMOGLOBIN A1C
HEMOGLOBIN A1C: 13 % — AB (ref 4.8–5.6)
MEAN PLASMA GLUCOSE: 326.4 mg/dL

## 2016-11-08 LAB — GLUCOSE, CAPILLARY
GLUCOSE-CAPILLARY: 136 mg/dL — AB (ref 65–99)
GLUCOSE-CAPILLARY: 136 mg/dL — AB (ref 65–99)
GLUCOSE-CAPILLARY: 208 mg/dL — AB (ref 65–99)
GLUCOSE-CAPILLARY: 209 mg/dL — AB (ref 65–99)
GLUCOSE-CAPILLARY: 284 mg/dL — AB (ref 65–99)
Glucose-Capillary: 280 mg/dL — ABNORMAL HIGH (ref 65–99)
Glucose-Capillary: 238 mg/dL — ABNORMAL HIGH (ref 65–99)

## 2016-11-08 LAB — LIPID PANEL
Cholesterol: 123 mg/dL (ref 0–200)
HDL: 44 mg/dL (ref >40)
LDL CALC: 54 mg/dL (ref 0–99)
Total CHOL/HDL Ratio: 2.8 RATIO
Triglycerides: 123 mg/dL (ref <150)
VLDL: 25 mg/dL (ref 0–40)

## 2016-11-08 MED ORDER — ONDANSETRON HCL 4 MG/2ML IJ SOLN
4.0000 mg | Freq: Four times a day (QID) | INTRAMUSCULAR | Status: DC | PRN
  Administered 2016-11-08: 4 mg via INTRAVENOUS
  Filled 2016-11-08: qty 2

## 2016-11-08 MED ORDER — INSULIN ASPART 100 UNIT/ML ~~LOC~~ SOLN
5.0000 [IU] | Freq: Three times a day (TID) | SUBCUTANEOUS | Status: DC
  Administered 2016-11-08 – 2016-11-09 (×4): 5 [IU] via SUBCUTANEOUS

## 2016-11-08 MED ORDER — METOCLOPRAMIDE HCL 5 MG/ML IJ SOLN
5.0000 mg | Freq: Three times a day (TID) | INTRAMUSCULAR | Status: DC
  Administered 2016-11-08 – 2016-11-09 (×3): 5 mg via INTRAVENOUS
  Filled 2016-11-08 (×3): qty 2

## 2016-11-08 MED ORDER — CLOPIDOGREL BISULFATE 75 MG PO TABS
75.0000 mg | ORAL_TABLET | Freq: Every day | ORAL | Status: DC
  Administered 2016-11-08 – 2016-11-09 (×2): 75 mg via ORAL
  Filled 2016-11-08 (×2): qty 1

## 2016-11-08 MED ORDER — HYDROCODONE-ACETAMINOPHEN 5-325 MG PO TABS: 1.0000 | ORAL_TABLET | Freq: Four times a day (QID) | ORAL | Status: DC | PRN

## 2016-11-08 NOTE — Progress Notes (Signed)
Inpatient Diabetes Program Recommendations  AACE/ADA: New Consensus Statement on Inpatient Glycemic Control (2015)  Target Ranges:  Prepandial:   less than 140 mg/dL      Peak postprandial:   less than 180 mg/dL (1-2 hours)      Critically ill patients:  140 - 180 mg/dL   Results for Meghan Ford, Meghan Ford (MRN [id number]) as of 11/08/2016 11:43  Ref. Range 11/08/2016 00:13 11/08/2016 05:15 11/08/2016 06:12 11/08/2016 08:15 11/08/2016 11:38  Glucose-Capillary Latest Ref Range: 65 - 99 mg/dL 208 (H) 284 (H) 280 (H) 238 (H) 209 (H)   Results for Meghan Ford, Meghan Ford (MRN [id number]) as of 11/08/2016 11:43  Ref. Range 06/30/2016 15:16 10/04/2016 15:40 11/07/2016 07:15  Hemoglobin A1C Latest Ref Range: 4.8 - 5.6 % 11.9 12.8 13.1 (H)    Admit with: CVA  History: DM  Home DM Meds: Lantus 30 units BID  Current Insulin Orders: Lantus 30 units BID      Novolog Resistant Correction Scale/ SSI (0-20 units) Q4 hours        Novolog 5 units TID with meals      Met again with patient today with Meghan Ford, Meghan Ford.  Pt's son at bedside as well.  Prior to entering room, I overheard patient on the phone speaking Spanish with another person.  She sounded awake and appropriate.  Upon entering her room, Meghan Ford introduced me to patient as the Diabetes Nurse.  Patient kept her eyes closed the entire time I was in the room and told me (through the Ford) that she felt "bad" and didn't want to answer any questions.  She stated to Meghan Ford that she doesn't know what's wrong with her and just "feels bad".  Did not want to converse with me at all or answer any of my questions regarding her diabetes.  I asked pt for permission to speak with her son and she stated "I don't know" in [place].  Son told me she was very independent prior to admission.  Gets medications and CBG meter supplies at the [facility].  I asked son if pt knows what her CBGs should be, how often she  checks, if she knows how to take the insulin.  Pt's son told me that she has been taking her insulin until she ran out of needles (syringes) about 1 week prior to admission.  Does not check CBGs at home (son speculated it's b/c she doesn't feel like checking them).  Needs lancets for her CBG meter.  Son stated that he plans to go the [facility] to get pt's Rxs refilled.  I encouraged son to encourage his mom (the patient) to check her CBGs and to take her insulin as directed.  Will have Meghan Ford and the Diabetes Coordinator attempt to meet with patient again tomorrow (9am Thursday, 08/30) to discuss her diabetes care at home.     --Will follow patient during hospitalization--  Meghan Quaker RN, MSN, CDE Diabetes Coordinator Inpatient Glycemic Control Team Team Pager: [phone] (8a-5p)

## 2016-11-08 NOTE — Progress Notes (Signed)
PROGRESS NOTE    Meghan Ford  ZOX:[id number] DOB: 1940-07-27 DOA: 11/07/2016 PCP: Jaclyn ShaggyAmao, Enobong, MD    Brief Narrative:  Meghan Ford is a 76 y.o. female with medical history significant for noncompliance with medical therapies, obesity, hypertension, dyslipidemia, and diabetes on insulin, presented to ED with complaints of left sided weakness and numbness. Admitted for TIA work up, was found to have watershed infarcts on the MRI brain. Neurology consulted for recommendations.   Assessment & Plan:   Principal Problem:   TIA (transient ischemic attack) Active Problems:   Insulin dependent type 2 diabetes mellitus, uncontrolled (HCC)   HLD (hyperlipidemia)   HTN (hypertension)   Multiple sub acute water shed infarcts coming in for left sided weakness, admitted for evaluation of CVA: Pt on 325 mg of aspirin at home, but non compliant to meds. plavix added this admission. On lipitor 80 mg daily. Neurology consulted and stroke work up in progress LDL is 54 and HDL is 44,  [id number] is 13/  UDS is negative.   Uncontrolled DM:  CBG (last 3)   Recent Labs  11/08/16 0515 11/08/16 0612 11/08/16 0815  GLUCAP 284* 280* 238*    On lantus 30 units BID.  Add 5 units TIDAC.  Continue with SSI.   Nausea, vomiting earlier this am: suspect gastroparesis:  Started on IV reglan.  Monitor.    DVT prophylaxis: (Lovenox/) Code Status: full code.  Family Communication: none at bedside.  Disposition Plan: pending therapy evals.    Consultants:   Neurology.    Procedures: MRI Brain MRA HEAD and neck.   Echocardiogram - No cardiac source of emboli was indentified.   Antimicrobials: none.    Subjective: Barely woke to answer  Few questions.   Objective: Vitals:   11/07/16 2100 11/08/16 0516 11/08/16 0618 11/08/16 0830  BP: 121/66 (!) 129/58 (!) 158/61 135/60  Pulse: 82 73 72 65  Resp:  18 18 18   Temp: 98.9 F (37.2 C) 98.9 F (37.2 C)  98.5 F  (36.9 C)  TempSrc: Oral Oral  Oral  SpO2: 98% 94% 94% 95%  Weight:      Height:        Intake/Output Summary (Last 24 hours) at 11/08/16 0928 Last data filed at 11/08/16 0324  Gross per 24 hour  Intake           738.33 ml  Output                0 ml  Net           738.33 ml   Filed Weights   11/06/16 2044  Weight: 103.9 kg (229 lb)    Examination:  General exam: lethargic, comfortable.  Respiratory system: Clear to auscultation. Respiratory effort normal. Cardiovascular system: S1 & S2 heard, RRR. No JVD, murmurs, rubs, gallops or clicks. No pedal edema. Gastrointestinal system: Abdomen is nondistended, soft and nontender. No organomegaly or masses felt. Normal bowel sounds heard. Central nervous system: lethargic, able to move all extremities and was able to ambulate from the bed to the bathroom.  Extremities: Symmetric 5 x 5 power. Varicose veins.  Skin: No rashes, lesions or ulcers Psychiatry:  Mood & affect appropriate.     Data Reviewed: I have personally reviewed following labs and imaging studies  CBC:  Recent Labs Lab 11/06/16 2050 11/06/16 2104  WBC 8.2  --   NEUTROABS 4.9  --   HGB 14.3 16.0*  HCT 44.4 47.0*  MCV 80.1  --  PLT 218  --    Basic Metabolic Panel:  Recent Labs Lab 11/06/16 2050 11/06/16 2104  NA 132* 133*  K 4.0 4.1  CL 97* 96*  CO2 27  --   GLUCOSE 452* 452*  BUN 29* 32*  CREATININE 1.02* 1.00  CALCIUM 9.0  --    GFR: Estimated Creatinine Clearance: 58 mL/min (by C-G formula based on SCr of 1 mg/dL). Liver Function Tests:  Recent Labs Lab 11/06/16 2050  AST 19  ALT 16  ALKPHOS 104  BILITOT 0.6  PROT 6.8  ALBUMIN 3.6   No results for input(s): LIPASE, AMYLASE in the last 168 hours. No results for input(s): AMMONIA in the last 168 hours. Coagulation Profile:  Recent Labs Lab 11/06/16 2050  INR 1.00   Cardiac Enzymes: No results for input(s): CKTOTAL, CKMB, CKMBINDEX, TROPONINI in the last 168 hours. BNP  (last 3 results) No results for input(s): PROBNP in the last 8760 hours. HbA1C:  Recent Labs  11/07/16 0715  HGBA1C 13.1*   CBG:  Recent Labs Lab 11/07/16 2115 11/08/16 0013 11/08/16 0515 11/08/16 0612 11/08/16 0815  GLUCAP 161* 208* 284* 280* 238*   Lipid Profile:  Recent Labs  11/07/16 0912 11/08/16 0803  CHOL 134 123  HDL 49 44  LDLCALC 56 54  TRIG 145 123  CHOLHDL 2.7 2.8   Thyroid Function Tests: No results for input(s): TSH, T4TOTAL, FREET4, T3FREE, THYROIDAB in the last 72 hours. Anemia Panel: No results for input(s): VITAMINB12, FOLATE, FERRITIN, TIBC, IRON, RETICCTPCT in the last 72 hours. Sepsis Labs: No results for input(s): PROCALCITON, LATICACIDVEN in the last 168 hours.  No results found for this or any previous visit (from the past 240 hour(s)).       Radiology Studies: Ct Head Wo Contrast  Result Date: 11/06/2016 CLINICAL DATA:  Numbness and weakness to the left side EXAM: CT HEAD WITHOUT CONTRAST TECHNIQUE: Contiguous axial images were obtained from the base of the skull through the vertex without intravenous contrast. COMPARISON:  None. FINDINGS: Brain: No evidence of acute infarction, hemorrhage, hydrocephalus, extra-axial collection or mass lesion/mass effect. Moderate atrophy. 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 Vascular: No hyperdense vessels. Scattered calcifications at the carotid siphons. Skull: Normal. Negative for fracture or focal lesion. Sinuses/Orbits: Mild mucosal thickening in the ethmoid sinuses. No acute orbital abnormality Other: None IMPRESSION: No CT evidence for acute intracranial abnormality. Electronically Signed   By: Jasmine Pang M.D.   On: 11/06/2016 22:14   Mr Maxine Glenn Neck W Wo Contrast  Result Date: 11/07/2016 CLINICAL DATA:  TIA, initial exam. Numbness and weakness in the left side. EXAM: MRI HEAD WITHOUT AND WITH CONTRAST MRA HEAD WITHOUT CONTRAST MRA NECK WITHOUT AND WITH CONTRAST TECHNIQUE:  Multiplanar, multiecho pulse sequences of the brain and surrounding structures were obtained without and with intravenous contrast. Angiographic images of the Circle of Willis were obtained using MRA technique without intravenous contrast. Angiographic images of the neck were obtained using MRA technique without and with intravenous contrast. Carotid stenosis measurements (when applicable) are obtained utilizing NASCET criteria, using the distal internal carotid diameter as the denominator. CONTRAST:  20mL MULTIHANCE GADOBENATE DIMEGLUMINE 529 MG/ML IV SOLN COMPARISON:  Head CT from yesterday FINDINGS: MRI HEAD FINDINGS Brain: Clustered area of mildly restricted diffusion along the posterior aspect of the right lateral ventricle, measuring up to 1 cm in size. These areas homogeneously enhance. Patient's clinical history is rapid onset of left-sided weakness and numbness has subsequently improved. Suspect these are  ischemic in the deep watershed. Mass, including lymphoma can have a similar appearance and distribution, but not likely in this case. Small remote right occipital infarct. 6 mm right sellar cyst, without septation or nodularity to prompt follow-up. No hemorrhage, hydrocephalus, or shift. Vascular: Patent venous sinuses.  Arterial findings below Skull and upper cervical spine: Negative for marrow lesion. Sinuses/Orbits: Negative MRA HEAD FINDINGS Tortuosity of the distal cervical ICAs, especially on the left. There is mild narrowing of the left supraclinoid ICA attributed to atherosclerosis. High-grade right M1 and M2 segment narrowing. Mild left M2 branch stenosis. No major branch occlusion is seen. No generalized vessel beading. Negative for aneurysm. Mildly dominant right vertebral artery. Dominant right PICA with proximal high-grade stenosis. The vertebral arteries are patent with mild left V4 segment stenosis. There is a moderate proximal and high-grade distal basilar stenosis. No visible flow within  the right PCA circulation. Fetal type left PCA with robust flow. Negative for aneurysm. MRA NECK FINDINGS Time-of-flight shows antegrade flow in both carotid and vertebral arteries. On postcontrast imaging the aortic arch is normal diameter where seen. Two vessel branching pattern. Both cervical carotids are tortuous without stenosis or visible ulceration. There is at least moderate narrowing of the right vertebral artery at its origin. Intermittent mild moderate right V2 segment narrowings, likely atheromatous. Otherwise vertebral arteries are smooth and widely patent to the dura. IMPRESSION: 1. Patchy enhancing signal abnormality around the right lateral ventricle consistent with deep watershed subacute infarcts in this setting. If progressive symptoms repeat with contrast recommended. 2. High-grade right M1 and M2 segment stenoses. Advanced right proximal PCA disease with no visible right PCA flow. 3. Multifocal vertebrobasilar stenosis, mild at the left V4 segment, moderate at the proximal basilar, and advanced at the distal basilar. Right PICA stenosis. 4. No cervical carotid stenosis. 5. Right V1 and V2 segment stenoses, at least moderate at the origin. Electronically Signed   By: Marnee Spring M.D.   On: 11/07/2016 09:38   Mr Laqueta Jean ZO Contrast  Result Date: 11/07/2016 CLINICAL DATA:  TIA, initial exam. Numbness and weakness in the left side. EXAM: MRI HEAD WITHOUT AND WITH CONTRAST MRA HEAD WITHOUT CONTRAST MRA NECK WITHOUT AND WITH CONTRAST TECHNIQUE: Multiplanar, multiecho pulse sequences of the brain and surrounding structures were obtained without and with intravenous contrast. Angiographic images of the Circle of Willis were obtained using MRA technique without intravenous contrast. Angiographic images of the neck were obtained using MRA technique without and with intravenous contrast. Carotid stenosis measurements (when applicable) are obtained utilizing NASCET criteria, using the distal  internal carotid diameter as the denominator. CONTRAST:  20mL MULTIHANCE GADOBENATE DIMEGLUMINE 529 MG/ML IV SOLN COMPARISON:  Head CT from yesterday FINDINGS: MRI HEAD FINDINGS Brain: Clustered area of mildly restricted diffusion along the posterior aspect of the right lateral ventricle, measuring up to 1 cm in size. These areas homogeneously enhance. Patient's clinical history is rapid onset of left-sided weakness and numbness has subsequently improved. Suspect these are ischemic in the deep watershed. Mass, including lymphoma can have a similar appearance and distribution, but not likely in this case. Small remote right occipital infarct. 6 mm right sellar cyst, without septation or nodularity to prompt follow-up. No hemorrhage, hydrocephalus, or shift. Vascular: Patent venous sinuses.  Arterial findings below Skull and upper cervical spine: Negative for marrow lesion. Sinuses/Orbits: Negative MRA HEAD FINDINGS Tortuosity of the distal cervical ICAs, especially on the left. There is mild narrowing of the left supraclinoid ICA attributed to atherosclerosis. High-grade right  M1 and M2 segment narrowing. Mild left M2 branch stenosis. No major branch occlusion is seen. No generalized vessel beading. Negative for aneurysm. Mildly dominant right vertebral artery. Dominant right PICA with proximal high-grade stenosis. The vertebral arteries are patent with mild left V4 segment stenosis. There is a moderate proximal and high-grade distal basilar stenosis. No visible flow within the right PCA circulation. Fetal type left PCA with robust flow. Negative for aneurysm. MRA NECK FINDINGS Time-of-flight shows antegrade flow in both carotid and vertebral arteries. On postcontrast imaging the aortic arch is normal diameter where seen. Two vessel branching pattern. Both cervical carotids are tortuous without stenosis or visible ulceration. There is at least moderate narrowing of the right vertebral artery at its origin.  Intermittent mild moderate right V2 segment narrowings, likely atheromatous. Otherwise vertebral arteries are smooth and widely patent to the dura. IMPRESSION: 1. Patchy enhancing signal abnormality around the right lateral ventricle consistent with deep watershed subacute infarcts in this setting. If progressive symptoms repeat with contrast recommended. 2. High-grade right M1 and M2 segment stenoses. Advanced right proximal PCA disease with no visible right PCA flow. 3. Multifocal vertebrobasilar stenosis, mild at the left V4 segment, moderate at the proximal basilar, and advanced at the distal basilar. Right PICA stenosis. 4. No cervical carotid stenosis. 5. Right V1 and V2 segment stenoses, at least moderate at the origin. Electronically Signed   By: Marnee Spring M.D.   On: 11/07/2016 09:38   Mr Shirlee Latch ZO Contrast  Result Date: 11/07/2016 CLINICAL DATA:  TIA, initial exam. Numbness and weakness in the left side. EXAM: MRI HEAD WITHOUT AND WITH CONTRAST MRA HEAD WITHOUT CONTRAST MRA NECK WITHOUT AND WITH CONTRAST TECHNIQUE: Multiplanar, multiecho pulse sequences of the brain and surrounding structures were obtained without and with intravenous contrast. Angiographic images of the Circle of Willis were obtained using MRA technique without intravenous contrast. Angiographic images of the neck were obtained using MRA technique without and with intravenous contrast. Carotid stenosis measurements (when applicable) are obtained utilizing NASCET criteria, using the distal internal carotid diameter as the denominator. CONTRAST:  20mL MULTIHANCE GADOBENATE DIMEGLUMINE 529 MG/ML IV SOLN COMPARISON:  Head CT from yesterday FINDINGS: MRI HEAD FINDINGS Brain: Clustered area of mildly restricted diffusion along the posterior aspect of the right lateral ventricle, measuring up to 1 cm in size. These areas homogeneously enhance. Patient's clinical history is rapid onset of left-sided weakness and numbness has  subsequently improved. Suspect these are ischemic in the deep watershed. Mass, including lymphoma can have a similar appearance and distribution, but not likely in this case. Small remote right occipital infarct. 6 mm right sellar cyst, without septation or nodularity to prompt follow-up. No hemorrhage, hydrocephalus, or shift. Vascular: Patent venous sinuses.  Arterial findings below Skull and upper cervical spine: Negative for marrow lesion. Sinuses/Orbits: Negative MRA HEAD FINDINGS Tortuosity of the distal cervical ICAs, especially on the left. There is mild narrowing of the left supraclinoid ICA attributed to atherosclerosis. High-grade right M1 and M2 segment narrowing. Mild left M2 branch stenosis. No major branch occlusion is seen. No generalized vessel beading. Negative for aneurysm. Mildly dominant right vertebral artery. Dominant right PICA with proximal high-grade stenosis. The vertebral arteries are patent with mild left V4 segment stenosis. There is a moderate proximal and high-grade distal basilar stenosis. No visible flow within the right PCA circulation. Fetal type left PCA with robust flow. Negative for aneurysm. MRA NECK FINDINGS Time-of-flight shows antegrade flow in both carotid and vertebral arteries. On postcontrast  imaging the aortic arch is normal diameter where seen. Two vessel branching pattern. Both cervical carotids are tortuous without stenosis or visible ulceration. There is at least moderate narrowing of the right vertebral artery at its origin. Intermittent mild moderate right V2 segment narrowings, likely atheromatous. Otherwise vertebral arteries are smooth and widely patent to the dura. IMPRESSION: 1. Patchy enhancing signal abnormality around the right lateral ventricle consistent with deep watershed subacute infarcts in this setting. If progressive symptoms repeat with contrast recommended. 2. High-grade right M1 and M2 segment stenoses. Advanced right proximal PCA disease with  no visible right PCA flow. 3. Multifocal vertebrobasilar stenosis, mild at the left V4 segment, moderate at the proximal basilar, and advanced at the distal basilar. Right PICA stenosis. 4. No cervical carotid stenosis. 5. Right V1 and V2 segment stenoses, at least moderate at the origin. Electronically Signed   By: Marnee Spring M.D.   On: 11/07/2016 09:38        Scheduled Meds: . aspirin  325 mg Oral Daily  . atorvastatin  80 mg Oral Daily  . clopidogrel  75 mg Oral Daily  . enoxaparin (LOVENOX) injection  40 mg Subcutaneous Q24H  . gabapentin  600 mg Oral QHS  . insulin aspart  0-20 Units Subcutaneous Q4H  . insulin glargine  30 Units Subcutaneous BID   Continuous Infusions: . sodium chloride 50 mL/hr at 11/07/16 1238     LOS: 0 days    Time spent: 35 minutes.     Kathlen Mody, MD [facility] Pager [phone]  If 7PM-7AM, please contact night-coverage [email or web address] Password [facility] 11/08/2016, 9:28 AM

## 2016-11-08 NOTE — Progress Notes (Signed)
Pt c/o of nausea while using the bathroom, vomited a little clear fluid and observed to be weak, family at bedside said she vomits almost every morning for the past few days, pt assisted back to bed, BP read 158/91, CBG read 280, NP Cloyde ReamsKatherine Schoor paged and notified for nausea medication, iv Zofran 4mg  given at 0631, pt and family reassured, will cntinue to monitor. Ob[place]-Asidi, Mccoy Testa Efe

## 2016-11-08 NOTE — Progress Notes (Signed)
Nutrition Consult/Brief Note  RD consulted for nutrition education regarding diabetes.  Ermalinda Barriosalled [name], Spanish interpreter x 1, however, no answer.  Lab Results  Component Value Date   HGBA1C 13.0 (H) 11/08/2016   Pt sleeping upon RD visit. Provided "Carbohydrate Counting for People with Diabetes" handout (in Spanish) from the Academy of [employer]etics to pt's son.  Body mass index is 39.31 kg/m. Pt meets criteria for Obesity Class II based on current BMI.  Current diet order is Carbohydrate Modified. Labs and medications reviewed. CBG's 280-238-209.  If additional nutrition issues arise, please re-consult RD.  Maureen ChattersKatie Isador Castille, RD, LDN Pager #: [phone] After-Hours Pager #: [phone]

## 2016-11-08 NOTE — Progress Notes (Signed)
Student nurse reported IV removed from left forearm during positioning. Attempted to restart. IV team order to be placed.

## 2016-11-08 NOTE — Evaluation (Signed)
Speech Language Pathology Evaluation Patient Details Name: Meghan Ford MRN: [id number] DOB: 03-Jun-1940 Today's Date: 11/08/2016 Time: 1040-1050 SLP Time Calculation (min) (ACUTE ONLY): 10 min  Problem List:  Patient Active Problem List   Diagnosis Date Noted  . TIA (transient ischemic attack) 11/07/2016  . Insulin dependent type 2 diabetes mellitus, uncontrolled (HCC) 11/07/2016  . HLD (hyperlipidemia) 11/07/2016  . HTN (hypertension) 11/07/2016  . Ischemic stroke (HCC)   . Noncompliance with diet and medication regimen 08/26/2015  . S/p reverse total shoulder arthroplasty   . Leukocytosis   . Proximal humerus fracture 10/26/2014  . Hyperlipidemia 10/23/2014  . Hypokalemia 10/09/2014  . Essential hypertension 10/09/2014  . Protein-calorie malnutrition, severe (HCC) 09/29/2014  . Sepsis (HCC) 09/28/2014  . Fever 09/28/2014  . Headache 09/28/2014  . Diabetes mellitus with neuropathy (HCC)   . Pyrexia   . Uncontrolled hypertension 10/15/2012  . Neuropathic pain of both legs 10/15/2012  . Palpitations 05/21/2012  . Dyslipidemia 05/15/2012  . Hypertension   . Hyperlipidemia LDL goal < 100   . Diabetes mellitus type II, uncontrolled (HCC)   . Varicose veins of lower extremities with other complications 11/07/2011   Past Medical History:  Past Medical History:  Diagnosis Date  . Diabetes mellitus type II, uncontrolled (HCC)   . Diabetes mellitus without complication (HCC)   . Fx humeral neck   . Hyperlipidemia LDL goal < 100   . Hypertension   . Leg pain    worse with prolonged standing  . Peripheral vascular disease (HCC)   . Varicose veins    Past Surgical History:  Past Surgical History:  Procedure Laterality Date  . OTHER SURGICAL HISTORY     reports varicose vein procedure  . REVERSE SHOULDER ARTHROPLASTY Left 10/27/2014   Procedure: REVERSE SHOULDER ARTHROPLASTY;  Surgeon: Sheral Apley, MD;  Location: [facility] OR;  Service: Orthopedics;  Laterality:  Left;  . TOTAL SHOULDER ARTHROPLASTY Left 10/27/2014   Procedure: TOTAL SHOULDER ARTHROPLASTY;  Surgeon: Sheral Apley, MD;  Location: [facility] OR;  Service: Orthopedics;  Laterality: Left;  Marland Kitchen VEIN SURGERY     left leg   HPI:  76 y.o. female past medical history of uncontrolled diabetes mellitus, hypertension, hyperlipidemia who presents with left facial droop and left sided weakness when she woke up. MRI shows Clustered area of mildly restricted diffusion along the posterior aspect of the right lateral ventricle, measuring up to 1 cm in size.    Assessment / Plan / Recommendation Clinical Impression  Pt demosntrates adequate cognitive lingusitic function consistent with baseline. Memory is adequate. Pt is slightly poorly participatory due to her complaint of "feeling bad" and wantinng to keep her eyes closed and general distress given her situation, ("I cant walk!"). When talking to her son on the phone she slurred her words as if she were drowsy, though otherwise articulation was WNL for an edentulous woman. No SLP f/u needed.     SLP Assessment  SLP Recommendation/Assessment: Patient does not need any further Speech Lanaguage Pathology Services    Follow Up Recommendations  None    Frequency and Duration           SLP Evaluation Cognition  Overall Cognitive Status: Within Functional Limits for tasks assessed       Comprehension  Auditory Comprehension Overall Auditory Comprehension: Appears within functional limits for tasks assessed    Expression Verbal Expression Overall Verbal Expression: Appears within functional limits for tasks assessed   Oral / Motor  Oral Motor/Sensory Function  Overall Oral Motor/Sensory Function: Mild impairment Facial ROM: Within Functional Limits Facial Symmetry: Abnormal symmetry left;Suspected CN VII (facial) dysfunction Facial Strength: Within Functional Limits Facial Sensation: Within Functional Limits Lingual ROM: Within Functional  Limits Lingual Symmetry: Within Functional Limits Lingual Strength: Within Functional Limits Lingual Sensation: Within Functional Limits Velum: Within Functional Limits Mandible: Within Functional Limits Motor Speech Overall Motor Speech: Appears within functional limits for tasks assessed   GO          Functional Assessment Tool Used: clinical judgement Functional Limitations: Attention Attention Current Status (V7846): 0 percent impaired, limited or restricted Attention Goal Status (N6295): 0 percent impaired, limited or restricted Attention Discharge Status (M8413): 0 percent impaired, limited or restricted        [facility], MA CCC-SLP [phone]  Meghan Ford 11/08/2016, 11:08 AM

## 2016-11-08 NOTE — Progress Notes (Signed)
Occupational Therapy Evaluation Patient Details Name: Meghan Ford MRN: [id number] DOB: 01-29-1941 Today's Date: 11/08/2016    History of Present Illness 76 y.o. female past medical history of uncontrolled diabetes mellitus, hypertension, hyperlipidemia who presents with left facial droop and left sided weakness when she woke up; neuro work up underway   Clinical Impression   PTA, pt independent with ADL and mobility. Pt currently requires min guard A with mobility and ADL @ RW level. Daughter prestn and states family can provide assistance at DC. Will follow acutely to facilitate safe DC home.     Follow Up Recommendations  Supervision/Assistance - 24 hour    Equipment Recommendations  3 in 1 bedside commode    Recommendations for Other Services       Precautions / Restrictions Precautions Precautions: Fall Restrictions Weight Bearing Restrictions: No      Mobility Bed Mobility Overal bed mobility: Modified Independent                Transfers Overall transfer level: Needs assistance Equipment used: None Transfers: Sit to/from Stand Sit to Stand: Supervision              Balance Overall balance assessment: Needs assistance   Sitting balance-Leahy Scale: Good       Standing balance-Leahy Scale: Fair Standing balance comment: Pt reaching for walls/support when walking                           ADL either performed or assessed with clinical judgement   ADL Overall ADL's : Needs assistance/impaired     Grooming: Set up;Standing;Min guard;Wash/dry hands;Wash/dry face;Oral care   Upper Body Bathing: Set up;Supervision/ safety;Standing   Lower Body Bathing: Min guard;Sit to/from stand   Upper Body Dressing : Set up;Sitting   Lower Body Dressing: Min guard;Sit to/from stand   Toilet Transfer: Minimal assistance;Ambulation Toilet Transfer Details (indicate cue type and reason): A to rise from low toilet Toileting- Clothing  Manipulation and Hygiene: Supervision/safety;Sit to/from stand       Functional mobility during ADLs: Min guard;Rolling walker General ADL Comments: Daughter states she can assist as needed     Vision   Additional Comments: Will further assess. [name]     Perception     Praxis Praxis Praxis tested?: Within functional limits    Pertinent Vitals/Pain Pain Assessment: Faces Faces Pain Scale: Hurts a little bit Pain Location: general discomfort Pain Descriptors / Indicators: Grimacing Pain Intervention(s): Limited activity within patient's tolerance     Hand Dominance Left   Extremity/Trunk Assessment Upper Extremity Assessment Upper Extremity Assessment: Overall WFL for tasks assessed (pt reports mild weakness)   Lower Extremity Assessment Lower Extremity Assessment: Defer to PT evaluation   Cervical / Trunk Assessment Cervical / Trunk Assessment: Normal   Communication Communication Communication: Prefers language other than English   Cognition Arousal/Alertness: Awake/alert Behavior During Therapy: WFL for tasks assessed/performed Overall Cognitive Status: Difficult to assess                                 General Comments: per daughter  - at baseline   General Comments       Exercises     Shoulder Instructions      Home Living Family/patient expects to be discharged to:: Private residence Living Arrangements: Alone Available Help at Discharge: Family;Available 24 hours/day Type  of Home: House (rents a room) Home Access: Stairs to enter Secretary/administratorntrance Stairs-Number of Steps: 4 Entrance Stairs-Rails: Can reach both Home Layout: Able to live on main level with bedroom/bathroom     Bathroom Shower/Tub: Chief Strategy OfficerTub/shower unit   Bathroom Toilet: Standard Bathroom Accessibility: Yes   Home Equipment: None (walking stick)      Lives With:  (rents a room)    Prior Functioning/Environment Level of Independence:  Independent                 OT Problem List: Decreased activity tolerance;Impaired balance (sitting and/or standing);Decreased knowledge of use of DME or AE;Obesity      OT Treatment/Interventions: Self-care/ADL training;Therapeutic exercise;Neuromuscular education;DME and/or AE instruction;Therapeutic activities;Patient/family education;Balance training    OT Goals(Current goals can be found in the care plan section) Acute Rehab OT Goals Patient Stated Goal: to go home OT Goal Formulation: With patient/family Time For Goal Achievement: 11/22/16 Potential to Achieve Goals: Good  OT Frequency: Min 2X/week   Barriers to D/C:            Co-evaluation              AM-PAC PT "6 Clicks" Daily Activity     Outcome Measure Help from another person eating meals?: None Help from another person taking care of personal grooming?: None Help from another person toileting, which includes using toliet, bedpan, or urinal?: A Little Help from another person bathing (including washing, rinsing, drying)?: A Little Help from another person to put on and taking off regular upper body clothing?: None Help from another person to put on and taking off regular lower body clothing?: A Little 6 Click Score: 21   End of Session Equipment Utilized During Treatment: Rolling walker Nurse Communication: Mobility status  Activity Tolerance: Patient tolerated treatment well Patient left: in chair;with call bell/phone within reach;with family/visitor present  OT Visit Diagnosis: Unsteadiness on feet (R26.81);Muscle weakness (generalized) (M62.81)                Time: [id number] OT Time Calculation (min): 14 min Charges:  OT General Charges $OT Visit: 1 Visit OT Evaluation $OT Eval Low Complexity: 1 Low G-Codes: OT G-codes **NOT FOR INPATIENT CLASS** Functional Assessment Tool Used: Clinical judgement Functional Limitation: Self care Self Care Current Status ([id number]): At least 1 percent but less  than 20 percent impaired, limited or restricted Self Care Goal Status (J1914(G8988): At least 1 percent but less than 20 percent impaired, limited or restricted   [facility] LLCilary Melbert Botelho, OT/L  [phone] 11/08/2016  Cystal Shannahan,HILLARY 11/08/2016, 4:25 PM

## 2016-11-09 LAB — GLUCOSE, CAPILLARY
Glucose-Capillary: 129 mg/dL — ABNORMAL HIGH (ref 65–99)
Glucose-Capillary: 83 mg/dL (ref 65–99)
Glucose-Capillary: 138 mg/dL — ABNORMAL HIGH (ref 65–99)
Glucose-Capillary: 151 mg/dL — ABNORMAL HIGH (ref 65–99)

## 2016-11-09 MED ORDER — INSULIN GLARGINE 100 UNIT/ML ~~LOC~~ SOLN: 30.0000 [IU] | Freq: Two times a day (BID) | SUBCUTANEOUS | 1 refills | Status: DC

## 2016-11-09 MED ORDER — GABAPENTIN 300 MG PO CAPS: 600.0000 mg | ORAL_CAPSULE | Freq: Every day | ORAL | 1 refills | Status: DC

## 2016-11-09 MED ORDER — TRUEPLUS LANCETS 28G MISC: 1.0000 | Freq: Three times a day (TID) | 12 refills | Status: DC

## 2016-11-09 MED ORDER — CLOPIDOGREL BISULFATE 75 MG PO TABS: 75.0000 mg | ORAL_TABLET | Freq: Every day | ORAL | 0 refills | Status: DC

## 2016-11-09 MED ORDER — GLUCOSE BLOOD VI STRP: ORAL_STRIP | 12 refills | Status: AC

## 2016-11-09 MED ORDER — ASPIRIN 325 MG PO TABS: 325.0000 mg | ORAL_TABLET | Freq: Every day | ORAL | 2 refills | Status: DC

## 2016-11-09 MED ORDER — BLOOD GLUCOSE MONITOR KIT: PACK | 0 refills | Status: AC

## 2016-11-09 MED FILL — GABAPENTIN 300 MG CAPSULE: 300 | 30 days supply | Qty: 60 | Fill #1

## 2016-11-09 MED FILL — !TRUE METRIX BLOOD GLUCOSE: 30 days supply | Qty: 1 | Fill #0

## 2016-11-09 MED FILL — ?CLOPIDOGREL 75MG TAB: 75 | 30 days supply | Qty: 30 | Fill #0

## 2016-11-09 MED FILL — TRUE METRIX TEST STRIP: 30 days supply | Qty: 100 | Fill #0

## 2016-11-09 MED FILL — TRUEplus LANCETS 28G MISC: 30 days supply | Qty: 100 | Fill #0

## 2016-11-09 NOTE — Progress Notes (Signed)
Pt discharged at this time with son taking all personal belongings. IV discontinued, dry dressing applied. Discharge instructions provided with prescriptions with verbal understanding. Pt will follow up with Md per dc summary. No noted distress.

## 2016-11-09 NOTE — Discharge Summary (Addendum)
Physician Discharge Summary  Meghan Ford ZWC:[id number] DOB: Oct 29, 1940 DOA: 11/07/2016  PCP: Arnoldo Morale, MD  Admit date: 11/07/2016 Discharge date: 11/09/2016  Admitted From: Home.  Disposition:  Home.   Recommendations for Outpatient Follow-up:  1. Follow up with PCP in 1-2 weeks 2. Please obtain BMP/CBC in one week 3. Please follow up with neurology as recommended.   Home Health:yes   Discharge Condition:stable.  CODE STATUS: full code.  Diet recommendation: Heart Healthy   Brief/Interim Summary: Meghan Ford a 76 y.o.femalewith medical history significant for noncompliance with medical therapies, obesity, hypertension, dyslipidemia, and diabetes on insulin, presented to ED with complaints of left sided weakness and numbness. Admitted for TIA work up, was found to have watershed infarcts on the MRI brain. Neurology consulted for recommendations.    Discharge Diagnoses:  Principal Problem:   TIA (transient ischemic attack) Active Problems:   Insulin dependent type 2 diabetes mellitus, uncontrolled (HCC)   HLD (hyperlipidemia)   HTN (hypertension)   Intracranial vascular stenosis  Multiple sub acute water shed infarcts coming in for left sided weakness, admitted for evaluation of CVA: Pt on 325 mg of aspirin at home, but non compliant to meds. plavix added this admission. On lipitor 80 mg daily. Neurology consulted and stroke work up done.  LDL is 54 and HDL is 44,  hgba1c is 13. Risk factor modification .  UDS is negative.   Uncontrolled DM with hyperglycemia:  It appears pt has been non compliant to her medications. DM education and co ordinator came and spoke to the patient and family at bedside and educated about the use of insulin and checking  CBG'S.      Nausea, vomiting earlier this am: suspect gastroparesis:  Started on IV reglan.  Symptoms resolved.   Hypertension: permissive hypertension.      Discharge  Instructions  Discharge Instructions    Ambulatory referral to Neurology    Complete by:  As directed    Follow up with stroke clinic Cecille Rubin preferred, if not available, then consider Caesar Chestnut, [facility] or Jaynee Eagles whoever is available) at [facility] in about 6-8 weeks. Thanks.   Diet - low sodium heart healthy    Complete by:  As directed    Diet Carb Modified    Complete by:  As directed    Discharge instructions    Complete by:  As directed    Please follow up with outpatient neurology office and DM co ordinator as outpatient.     Allergies as of 11/09/2016   No Known Allergies     Medication List    STOP taking these medications   amLODipine 10 MG tablet Commonly known as:  NORVASC   ibuprofen 600 MG tablet Commonly known as:  ADVIL,MOTRIN     TAKE these medications   aspirin 325 MG tablet Take 1 tablet (325 mg total) by mouth daily.   atorvastatin 80 MG tablet Commonly known as:  LIPITOR Take 1 tablet (80 mg total) by mouth daily.   blood glucose meter kit and supplies Kit Dispense based on patient and insurance preference. Use up to four times daily as directed. (FOR ICD-9 250.00, 250.01).   clopidogrel 75 MG tablet Commonly known as:  PLAVIX Take 1 tablet (75 mg total) by mouth daily.   gabapentin 300 MG capsule Commonly known as:  NEURONTIN Take 2 capsules (600 mg total) by mouth at bedtime.   glucose blood test strip Use as instructed   insulin glargine 100 UNIT/ML injection  Commonly known as:  LANTUS Inject 0.3 mLs (30 Units total) into the skin 2 (two) times daily. What changed:  how much to take   isosorbide mononitrate 60 MG 24 hr tablet Commonly known as:  IMDUR Take 1 tablet (60 mg total) by mouth daily.   losartan-hydrochlorothiazide 100-25 MG tablet Commonly known as:  HYZAAR Take 1 tablet by mouth daily.   TRUEPLUS LANCETS 28G Misc 1 each by Does not apply route 3 (three) times daily before meals.            Durable Medical  Equipment        Start     Ordered   11/09/16 1024  DME 3-in-1  Once     11/09/16 1024       Discharge Care Instructions        Start     Ordered   11/10/16 0000  clopidogrel (PLAVIX) 75 MG tablet  Daily     11/09/16 1024   11/09/16 0000  aspirin 325 MG tablet  Daily     11/09/16 1024   11/09/16 0000  gabapentin (NEURONTIN) 300 MG capsule  Daily at bedtime    Comments:  Discontinue previous dose   11/09/16 1024   11/09/16 0000  glucose blood test strip     11/09/16 1024   11/09/16 0000  insulin glargine (LANTUS) 100 UNIT/ML injection  2 times daily    Comments:  Discontinue previous dose   11/09/16 1024   11/09/16 0000  TRUEPLUS LANCETS 28G MISC  3 times daily before meals     11/09/16 1024   11/09/16 0000  blood glucose meter kit and supplies KIT    Question Answer Comment  Number of strips 100   Number of lancets 100      11/09/16 1024   11/09/16 0000  Diet - low sodium heart healthy     11/09/16 1024   11/09/16 0000  Diet Carb Modified     11/09/16 1024   11/09/16 0000  Discharge instructions    Comments:  Please follow up with outpatient neurology office and DM co ordinator as outpatient.   11/09/16 1024   11/08/16 0000  Ambulatory referral to Neurology    Comments:  Follow up with stroke clinic Cecille Rubin preferred, if not available, then consider Caesar Chestnut, [facility] or Jaynee Eagles whoever is available) at [facility] in about 6-8 weeks. Thanks.   11/08/16 2258     Follow-up Information    Arnoldo Morale, MD Follow up.   Specialty:  Family Medicine Contact information: [place] [address] [phone]        Dennie Bible, NP. Schedule an appointment as soon as possible for a visit in 6 week(s).   Specialty:  Family Medicine Why:  stroke follow up Contact information: [address] [place] [address] [phone]          No Known Allergies  Consultations: Neurology.   Procedures/Studies: Ct Head Wo  Contrast  Result Date: 11/06/2016 CLINICAL DATA:  Numbness and weakness to the left side EXAM: CT HEAD WITHOUT CONTRAST TECHNIQUE: Contiguous axial images were obtained from the base of the skull through the vertex without intravenous contrast. COMPARISON:  None. FINDINGS: Brain: No evidence of acute infarction, hemorrhage, hydrocephalus, extra-axial collection or mass lesion/mass effect. Moderate atrophy. 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 Vascular: No hyperdense vessels. Scattered calcifications at the carotid siphons. Skull: Normal. Negative for fracture or focal lesion. Sinuses/Orbits: Mild  mucosal thickening in the ethmoid sinuses. No acute orbital abnormality Other: None IMPRESSION: No CT evidence for acute intracranial abnormality. Electronically Signed   By: Donavan Foil M.D.   On: 11/06/2016 22:14   Mr Jodene Nam Neck W Wo Contrast  Result Date: 11/07/2016 CLINICAL DATA:  TIA, initial exam. Numbness and weakness in the left side. EXAM: MRI HEAD WITHOUT AND WITH CONTRAST MRA HEAD WITHOUT CONTRAST MRA NECK WITHOUT AND WITH CONTRAST TECHNIQUE: Multiplanar, multiecho pulse sequences of the brain and surrounding structures were obtained without and with intravenous contrast. Angiographic images of the Circle of Willis were obtained using MRA technique without intravenous contrast. Angiographic images of the neck were obtained using MRA technique without and with intravenous contrast. Carotid stenosis measurements (when applicable) are obtained utilizing NASCET criteria, using the distal internal carotid diameter as the denominator. CONTRAST:  32m MULTIHANCE GADOBENATE DIMEGLUMINE 529 MG/ML IV SOLN COMPARISON:  Head CT from yesterday FINDINGS: MRI HEAD FINDINGS Brain: Clustered area of mildly restricted diffusion along the posterior aspect of the right lateral ventricle, measuring up to 1 cm in size. These areas homogeneously enhance. Patient's clinical history is rapid onset of  left-sided weakness and numbness has subsequently improved. Suspect these are ischemic in the deep watershed. Mass, including lymphoma can have a similar appearance and distribution, but not likely in this case. Small remote right occipital infarct. 6 mm right sellar cyst, without septation or nodularity to prompt follow-up. No hemorrhage, hydrocephalus, or shift. Vascular: Patent venous sinuses.  Arterial findings below Skull and upper cervical spine: Negative for marrow lesion. Sinuses/Orbits: Negative MRA HEAD FINDINGS Tortuosity of the distal cervical ICAs, especially on the left. There is mild narrowing of the left supraclinoid ICA attributed to atherosclerosis. High-grade right M1 and M2 segment narrowing. Mild left M2 branch stenosis. No major branch occlusion is seen. No generalized vessel beading. Negative for aneurysm. Mildly dominant right vertebral artery. Dominant right PICA with proximal high-grade stenosis. The vertebral arteries are patent with mild left V4 segment stenosis. There is a moderate proximal and high-grade distal basilar stenosis. No visible flow within the right PCA circulation. Fetal type left PCA with robust flow. Negative for aneurysm. MRA NECK FINDINGS Time-of-flight shows antegrade flow in both carotid and vertebral arteries. On postcontrast imaging the aortic arch is normal diameter where seen. Two vessel branching pattern. Both cervical carotids are tortuous without stenosis or visible ulceration. There is at least moderate narrowing of the right vertebral artery at its origin. Intermittent mild moderate right V2 segment narrowings, likely atheromatous. Otherwise vertebral arteries are smooth and widely patent to the dura. IMPRESSION: 1. Patchy enhancing signal abnormality around the right lateral ventricle consistent with deep watershed subacute infarcts in this setting. If progressive symptoms repeat with contrast recommended. 2. High-grade right M1 and M2 segment stenoses.  Advanced right proximal PCA disease with no visible right PCA flow. 3. Multifocal vertebrobasilar stenosis, mild at the left V4 segment, moderate at the proximal basilar, and advanced at the distal basilar. Right PICA stenosis. 4. No cervical carotid stenosis. 5. Right V1 and V2 segment stenoses, at least moderate at the origin. Electronically Signed   By: JMonte FantasiaM.D.   On: 11/07/2016 09:38   Mr BJeri CosWBTContrast  Result Date: 11/07/2016 CLINICAL DATA:  TIA, initial exam. Numbness and weakness in the left side. EXAM: MRI HEAD WITHOUT AND WITH CONTRAST MRA HEAD WITHOUT CONTRAST MRA NECK WITHOUT AND WITH CONTRAST TECHNIQUE: Multiplanar, multiecho pulse sequences of the brain and surrounding structures were obtained  without and with intravenous contrast. Angiographic images of the Circle of Willis were obtained using MRA technique without intravenous contrast. Angiographic images of the neck were obtained using MRA technique without and with intravenous contrast. Carotid stenosis measurements (when applicable) are obtained utilizing NASCET criteria, using the distal internal carotid diameter as the denominator. CONTRAST:  30m MULTIHANCE GADOBENATE DIMEGLUMINE 529 MG/ML IV SOLN COMPARISON:  Head CT from yesterday FINDINGS: MRI HEAD FINDINGS Brain: Clustered area of mildly restricted diffusion along the posterior aspect of the right lateral ventricle, measuring up to 1 cm in size. These areas homogeneously enhance. Patient's clinical history is rapid onset of left-sided weakness and numbness has subsequently improved. Suspect these are ischemic in the deep watershed. Mass, including lymphoma can have a similar appearance and distribution, but not likely in this case. Small remote right occipital infarct. 6 mm right sellar cyst, without septation or nodularity to prompt follow-up. No hemorrhage, hydrocephalus, or shift. Vascular: Patent venous sinuses.  Arterial findings below Skull and upper cervical  spine: Negative for marrow lesion. Sinuses/Orbits: Negative MRA HEAD FINDINGS Tortuosity of the distal cervical ICAs, especially on the left. There is mild narrowing of the left supraclinoid ICA attributed to atherosclerosis. High-grade right M1 and M2 segment narrowing. Mild left M2 branch stenosis. No major branch occlusion is seen. No generalized vessel beading. Negative for aneurysm. Mildly dominant right vertebral artery. Dominant right PICA with proximal high-grade stenosis. The vertebral arteries are patent with mild left V4 segment stenosis. There is a moderate proximal and high-grade distal basilar stenosis. No visible flow within the right PCA circulation. Fetal type left PCA with robust flow. Negative for aneurysm. MRA NECK FINDINGS Time-of-flight shows antegrade flow in both carotid and vertebral arteries. On postcontrast imaging the aortic arch is normal diameter where seen. Two vessel branching pattern. Both cervical carotids are tortuous without stenosis or visible ulceration. There is at least moderate narrowing of the right vertebral artery at its origin. Intermittent mild moderate right V2 segment narrowings, likely atheromatous. Otherwise vertebral arteries are smooth and widely patent to the dura. IMPRESSION: 1. Patchy enhancing signal abnormality around the right lateral ventricle consistent with deep watershed subacute infarcts in this setting. If progressive symptoms repeat with contrast recommended. 2. High-grade right M1 and M2 segment stenoses. Advanced right proximal PCA disease with no visible right PCA flow. 3. Multifocal vertebrobasilar stenosis, mild at the left V4 segment, moderate at the proximal basilar, and advanced at the distal basilar. Right PICA stenosis. 4. No cervical carotid stenosis. 5. Right V1 and V2 segment stenoses, at least moderate at the origin. Electronically Signed   By: JMonte FantasiaM.D.   On: 11/07/2016 09:38   Mr MVirgel PalingWDPContrast  Result Date:  11/07/2016 CLINICAL DATA:  TIA, initial exam. Numbness and weakness in the left side. EXAM: MRI HEAD WITHOUT AND WITH CONTRAST MRA HEAD WITHOUT CONTRAST MRA NECK WITHOUT AND WITH CONTRAST TECHNIQUE: Multiplanar, multiecho pulse sequences of the brain and surrounding structures were obtained without and with intravenous contrast. Angiographic images of the Circle of Willis were obtained using MRA technique without intravenous contrast. Angiographic images of the neck were obtained using MRA technique without and with intravenous contrast. Carotid stenosis measurements (when applicable) are obtained utilizing NASCET criteria, using the distal internal carotid diameter as the denominator. CONTRAST:  270mMULTIHANCE GADOBENATE DIMEGLUMINE 529 MG/ML IV SOLN COMPARISON:  Head CT from yesterday FINDINGS: MRI HEAD FINDINGS Brain: Clustered area of mildly restricted diffusion along the posterior aspect of the right lateral  ventricle, measuring up to 1 cm in size. These areas homogeneously enhance. Patient's clinical history is rapid onset of left-sided weakness and numbness has subsequently improved. Suspect these are ischemic in the deep watershed. Mass, including lymphoma can have a similar appearance and distribution, but not likely in this case. Small remote right occipital infarct. 6 mm right sellar cyst, without septation or nodularity to prompt follow-up. No hemorrhage, hydrocephalus, or shift. Vascular: Patent venous sinuses.  Arterial findings below Skull and upper cervical spine: Negative for marrow lesion. Sinuses/Orbits: Negative MRA HEAD FINDINGS Tortuosity of the distal cervical ICAs, especially on the left. There is mild narrowing of the left supraclinoid ICA attributed to atherosclerosis. High-grade right M1 and M2 segment narrowing. Mild left M2 branch stenosis. No major branch occlusion is seen. No generalized vessel beading. Negative for aneurysm. Mildly dominant right vertebral artery. Dominant right PICA  with proximal high-grade stenosis. The vertebral arteries are patent with mild left V4 segment stenosis. There is a moderate proximal and high-grade distal basilar stenosis. No visible flow within the right PCA circulation. Fetal type left PCA with robust flow. Negative for aneurysm. MRA NECK FINDINGS Time-of-flight shows antegrade flow in both carotid and vertebral arteries. On postcontrast imaging the aortic arch is normal diameter where seen. Two vessel branching pattern. Both cervical carotids are tortuous without stenosis or visible ulceration. There is at least moderate narrowing of the right vertebral artery at its origin. Intermittent mild moderate right V2 segment narrowings, likely atheromatous. Otherwise vertebral arteries are smooth and widely patent to the dura. IMPRESSION: 1. Patchy enhancing signal abnormality around the right lateral ventricle consistent with deep watershed subacute infarcts in this setting. If progressive symptoms repeat with contrast recommended. 2. High-grade right M1 and M2 segment stenoses. Advanced right proximal PCA disease with no visible right PCA flow. 3. Multifocal vertebrobasilar stenosis, mild at the left V4 segment, moderate at the proximal basilar, and advanced at the distal basilar. Right PICA stenosis. 4. No cervical carotid stenosis. 5. Right V1 and V2 segment stenoses, at least moderate at the origin. Electronically Signed   By: Monte Fantasia M.D.   On: 11/07/2016 09:38       Subjective: No new complaints.   Discharge Exam: Vitals:   11/09/16 0003 11/09/16 0534  BP: (!) 143/60 (!) 120/59  Pulse: 68 77  Resp: 17 18  Temp: 99 F (37.2 C) 99 F (37.2 C)  SpO2: 94% 93%   Vitals:   11/08/16 1755 11/08/16 2112 11/09/16 0003 11/09/16 0534  BP: (!) 127/54 (!) 134/52 (!) 143/60 (!) 120/59  Pulse: 64 65 68 77  Resp: '17 17 17 18  '$ Temp: 98 F (36.7 C) 98 F (36.7 C) 99 F (37.2 C) 99 F (37.2 C)  TempSrc: Oral Oral Oral Oral  SpO2: 95% 95% 94%  93%  Weight:      Height:        General: Pt is alert, awake, not in acute distress Cardiovascular: RRR, S1/S2 +, no rubs, no gallops Respiratory: CTA bilaterally, no wheezing, no rhonchi Abdominal: Soft, NT, ND, bowel sounds + Extremities: no edema, no cyanosis    The results of significant diagnostics from this hospitalization (including imaging, microbiology, ancillary and laboratory) are listed below for reference.     Microbiology: No results found for this or any previous visit (from the past 240 hour(s)).   Labs: BNP (last 3 results) No results for input(s): BNP in the last 8760 hours. Basic Metabolic Panel:  Recent Labs Lab 11/06/16 2050  11/06/16 2104  NA 132* 133*  K 4.0 4.1  CL 97* 96*  CO2 27  --   GLUCOSE 452* 452*  BUN 29* 32*  CREATININE 1.02* 1.00  CALCIUM 9.0  --    Liver Function Tests:  Recent Labs Lab 11/06/16 2050  AST 19  ALT 16  ALKPHOS 104  BILITOT 0.6  PROT 6.8  ALBUMIN 3.6   No results for input(s): LIPASE, AMYLASE in the last 168 hours. No results for input(s): AMMONIA in the last 168 hours. CBC:  Recent Labs Lab 11/06/16 2050 11/06/16 2104  WBC 8.2  --   NEUTROABS 4.9  --   HGB 14.3 16.0*  HCT 44.4 47.0*  MCV 80.1  --   PLT 218  --    Cardiac Enzymes: No results for input(s): CKTOTAL, CKMB, CKMBINDEX, TROPONINI in the last 168 hours. BNP: Invalid input(s): POCBNP CBG:  Recent Labs Lab 11/08/16 1633 11/08/16 1943 11/09/16 0213 11/09/16 0454 11/09/16 0913  GLUCAP 136* 136* 129* 83 138*   D-Dimer No results for input(s): DDIMER in the last 72 hours. Hgb A1c  Recent Labs  11/07/16 0715 11/08/16 0803  HGBA1C 13.1* 13.0*   Lipid Profile  Recent Labs  11/07/16 0912 11/08/16 0803  CHOL 134 123  HDL 49 44  LDLCALC 56 54  TRIG 145 123  CHOLHDL 2.7 2.8   Thyroid function studies No results for input(s): TSH, T4TOTAL, T3FREE, THYROIDAB in the last 72 hours.  Invalid input(s): FREET3 Anemia work  up No results for input(s): VITAMINB12, FOLATE, FERRITIN, TIBC, IRON, RETICCTPCT in the last 72 hours. Urinalysis    Component Value Date/Time   COLORURINE YELLOW 10/27/2014 2321   APPEARANCEUR CLEAR 10/27/2014 2321   LABSPEC 1.012 10/27/2014 2321   PHURINE 7.0 10/27/2014 2321   GLUCOSEU NEGATIVE 10/27/2014 2321   HGBUR NEGATIVE 10/27/2014 2321   BILIRUBINUR negative 08/25/2015 1515   KETONESUR NEGATIVE 10/27/2014 2321   PROTEINUR 100 08/25/2015 1515   PROTEINUR NEGATIVE 10/27/2014 2321   UROBILINOGEN 1.0 08/25/2015 1515   UROBILINOGEN 0.2 10/27/2014 2321   NITRITE negative 08/25/2015 1515   NITRITE NEGATIVE 10/27/2014 2321   LEUKOCYTESUR Negative 08/25/2015 1515   Sepsis Labs Invalid input(s): PROCALCITONIN,  WBC,  LACTICIDVEN Microbiology No results found for this or any previous visit (from the past 240 hour(s)).   Time coordinating discharge: Over 30 minutes  SIGNED:   Hosie Poisson, MD  [facility] 11/09/2016, 10:25 AM Pager   If 7PM-7AM, please contact night-coverage [email or web address] Password [email or web address]

## 2016-11-09 NOTE — Progress Notes (Signed)
Interpreter Wyvonnia DuskyGraciela Namihira for Diabetes educator

## 2016-11-09 NOTE — Progress Notes (Signed)
Occupational Therapy Treatment Patient Details Name: Meghan Ford MRN: [id number] DOB: 1940-11-10 Today's Date: 11/09/2016    History of present illness 76 y.o. female past medical history of uncontrolled diabetes mellitus, hypertension, hyperlipidemia who presents with left facial droop and left sided weakness when she woke up; neuro work up underway   OT comments  Pt progressing towards acute OT goals. Focus of session was tub transfer. Son present and involved in education. Pt does endorse blurriness in left eye, denies diplopia. D/c plan remains appropriate.   Follow Up Recommendations  Supervision/Assistance - 24 hour    Equipment Recommendations  3 in 1 bedside commode    Recommendations for Other Services      Precautions / Restrictions Precautions Precautions: Fall Restrictions Weight Bearing Restrictions: No       Mobility Bed Mobility Overal bed mobility: Modified Independent                Transfers Overall transfer level: Needs assistance Equipment used: None Transfers: Sit to/from Stand Sit to Stand: Supervision         General transfer comment: no physical assist, supervision for safety    Balance Overall balance assessment: Needs assistance   Sitting balance-Leahy Scale: Good       Standing balance-Leahy Scale: Fair Standing balance comment: Pt reaching for walls/support when walking                           ADL either performed or assessed with clinical judgement   ADL Overall ADL's : Needs assistance/impaired                         Toilet Transfer: Min guard;Ambulation       Tub/ Shower Transfer: Minimal assistance;Tub transfer;Ambulation Tub/Shower Transfer Details (indicate cue type and reason): min A to steady. simulated with walk-in shower. Advised pt and son to have someone standing next to pt when she attempts tub transfer at home. Son reports they plan to install grab bars.   General ADL  Comments: Tub and toilet transfer, in-room functional mobility. Also discussed fall risk with blurred vision in L eye.      Vision   Additional Comments: Pt reports blurriness in L eye since stroke. Advised pt and son for her to f/u with eye doctor or PCP is this does not resolve in the next few days.    Perception     Praxis      Cognition Arousal/Alertness: Awake/alert Behavior During Therapy: WFL for tasks assessed/performed Overall Cognitive Status: Difficult to assess                                 General Comments: per daughter  - at baseline        Exercises     Shoulder Instructions       General Comments      Pertinent Vitals/ Pain       Pain Assessment: Faces Faces Pain Scale: Hurts a little bit Pain Location: general discomfort Pain Descriptors / Indicators: Grimacing Pain Intervention(s): Monitored during session;Repositioned  Home Living                                          Prior Functioning/Environment  Frequency  Min 2X/week        Progress Toward Goals  OT Goals(current goals can now be found in the care plan section)  Progress towards OT goals: Progressing toward goals  Acute Rehab OT Goals Patient Stated Goal: to go home OT Goal Formulation: With patient/family Time For Goal Achievement: 11/22/16 Potential to Achieve Goals: Good ADL Goals Pt Will Perform Tub/Shower Transfer: with supervision;ambulating;3 in 1;with caregiver independent in assisting Additional ADL Goal #1: pt/family will independently verbalize 3 strategies to reduce risk of falls.   Plan Discharge plan remains appropriate    Co-evaluation                 AM-PAC PT "6 Clicks" Daily Activity     Outcome Measure   Help from another person eating meals?: None Help from another person taking care of personal grooming?: None Help from another person toileting, which includes using toliet, bedpan, or urinal?:  A Little Help from another person bathing (including washing, rinsing, drying)?: A Little Help from another person to put on and taking off regular upper body clothing?: None Help from another person to put on and taking off regular lower body clothing?: A Little 6 Click Score: 21    End of Session    OT Visit Diagnosis: Unsteadiness on feet (R26.81);Muscle weakness (generalized) (M62.81)   Activity Tolerance Patient tolerated treatment well   Patient Left in chair;with call bell/phone within reach;with family/visitor present   Nurse Communication Mobility status        Time: 1118-1130 OT Time Calculation (min): 12 min  Charges: OT General Charges $OT Visit: 1 Visit OT Treatments $Self Care/Home Management : 8-22 mins     Pilar GrammesMathews, Xsavier Seeley H 11/09/2016, 11:47 AM

## 2016-11-09 NOTE — Care Management Note (Signed)
Case Management Note  Patient Details  Name: Meghan Ford MRN: [id number] Date of Birth: [birth year]  Subjective/Objective:                    Action/Plan: [facility] Pt has no [facility]. [facility] notified to see if patient qualifies for charity services.  Pt also ordered a 3 in 1. [name] notified and it will be delivered to her room.  Patients family to provided transportation home.   Expected Discharge Date:  11/09/16               Expected Discharge Plan:  Home w Home Health Services  In-House Referral:     Discharge planning Services  CM Consult  Post Acute Care Choice:  Home Health Choice offered to:     DME Arranged:  3-N-1 DME Agency:  [facility] (charity)  HH Arranged:  PT HH Agency:  [facility] (charity)  Status of Service:  Completed, signed off  If discussed at Long Length of Stay Meetings, dates discussed:    Additional Comments:  Kermit BaloKelli F Ladarius Seubert, RN 11/09/2016, 11:49 AM

## 2016-11-09 NOTE — Progress Notes (Signed)
Inpatient Diabetes Program Recommendations  AACE/ADA: New Consensus Statement on Inpatient Glycemic Control (2015)  Target Ranges:  Prepandial:   less than 140 mg/dL      Peak postprandial:   less than 180 mg/dL (1-2 hours)      Critically ill patients:  140 - 180 mg/dL   Lab Results  Component Value Date   GLUCAP 138 (H) 11/09/2016   HGBA1C 13.0 (H) 11/08/2016    Review of Glycemic ControlResults for Meghan Ford, Latesha (MRN [id number]) as of 11/09/2016 11:05  Ref. Range 11/08/2016 16:33 11/08/2016 19:43 11/09/2016 02:13 11/09/2016 04:54 11/09/2016 09:13  Glucose-Capillary Latest Ref Range: 65 - 99 mg/dL 409136 (H) 811136 (H) 914129 (H) 83 138 (H)   Admit with: CVA  History: DM  Home DM Meds: Lantus 30 units BID  Current Insulin Orders: Lantus 30 units BID                                       Novolog Resistant Correction Scale/ SSI (0-20 units) Q4 hours                                       Novolog 5 units TID with meals   Spoke with patient and her son using interpreter.  Patient was sitting up and eating breakfast.  She was very engaged in conversation.  She states that she was taking her insulin at home and states it was "Lantus 30 units 2 times a day".  She states that she does rotate sites on abdomen.  She had been checking blood sugars yet ran out of lancets and therefore stopped.  We talked about basic survival skills of diabetes including hypoglycemia, signs and symptoms and how to treat.  Patient was able to teach back this to me through the interpreter.  We then discussed hyperglycemia signs and symptoms- patient states she does not know these symptoms b/c she does not feel different when her blood sugars are high.  However when we reviewed the symptoms of tired, thirsty and frequent urination, patient stated that she has all of these symptoms.  She state that when she checks her blood sugars they are never less than 200.  I encouraged her to check her blood sugars at least 3 times  a day, take her insulin and follow up with doctor.  I also encouraged them to call the MD if blood sugars consistently greater than 200 or less than 70 mg/dL.  We briefly discussed diet.  I encouraged her to eliminate sugar from beverages and discussed foods that would raise her blood sugars.  I encouraged her to eat 3 meals a day and snacks as well.  Patient lives alone.    **Patient is currently ordered her home dose of Lantus and blood sugars are much better.  This makes me wonder if she was actually taking her insulin as it was ordered.  Would benefit from home health to assist with medication management and to determine if patient is properly taking her medications at home.   Thanks, Beryl MeagerJenny Zanden Colver, RN, BC-ADM Inpatient Diabetes Coordinator Pager [phone] (8a-5p)

## 2016-12-05 MED FILL — !LANTUS 100 UNITS/ML VIAL: 100 | 30 days supply | Qty: 20 | Fill #0

## 2016-12-11 MED FILL — LOSARTAN-HCTZ 100-25 MG TAB: 100-25 | 30 days supply | Qty: 30 | Fill #1

## 2016-12-11 MED FILL — TRUEplus LANCETS 28G MISC: 30 days supply | Qty: 100 | Fill #1

## 2016-12-11 MED FILL — TRUE METRIX TEST STRIP: 30 days supply | Qty: 100 | Fill #1

## 2016-12-11 MED FILL — ATORVASTATIN 80 MG TABLET: 80 | 30 days supply | Qty: 30 | Fill #1

## 2016-12-11 MED FILL — AMLODIPINE BESYLATE 10 MG T: 10 | 30 days supply | Qty: 30 | Fill #1

## 2016-12-26 MED FILL — GABAPENTIN 300 MG CAPSULE: 300 | 30 days supply | Qty: 60 | Fill #0

## 2017-01-10 NOTE — Progress Notes (Deleted)
[facility]  PATIENT: Meghan Ford DOB: 10/17/1940   REASON FOR VISIT: Hospital follow-up for stroke HISTORY FROM:    HISTORY OF PRESENT ILLNESS:HISTORY OF PRESENT ILLNESS (per record) Meghan Ford an 76 y.o.Spanish-speaking female with a history of uncontrolled diabetes mellitus, hypertension, hyperlipidemia who presented with left facial droop and left sided weakness when she woke up on the morning of 11/06/2016.  She also noticed numbness and weakness of her left arm and leg that persisted for several hours which prompted her to come to the ER. However she states her symptoms have improved gradually over the day and now feels like she is back to her baseline.  She takes aspirin daily. She lives with 2 roommates and states that she takes her medications regularly.  Date last known well: 8.26.18  Time last known well: 21.00  NIHSS0  MRS baseline 0  Patient was not administered IV t-PA secondary to arriving outside of the tPA treatment window. She was admitted to General Neurology for further evaluation and treatment.    SUBJECTIVE (INTERVAL HISTORY) No family members at the bedside.  The patient is drowsy sleepy, sitting up in the chair asking for water.  She follows all commands appropriately but with language barrier. Hgba1C 13.0.  Diabetes Coordinator following.  ***  REVIEW OF SYSTEMS: Full 14 system review of systems performed and notable only for those listed, all others are neg:  Constitutional: neg  Cardiovascular: neg Ear/Nose/Throat: neg  Skin: neg Eyes: neg Respiratory: neg Gastroitestinal: neg  Hematology/Lymphatic: neg  Endocrine: neg Musculoskeletal:neg Allergy/Immunology: neg Neurological: neg Psychiatric: neg Sleep : neg   ALLERGIES: No Known Allergies  HOME MEDICATIONS: Outpatient Medications Prior to Visit  Medication Sig Dispense Refill  . aspirin 325 MG tablet Take 1 tablet (325 mg total)  by mouth daily. 30 tablet 2  . atorvastatin (LIPITOR) 80 MG tablet Take 1 tablet (80 mg total) by mouth daily. 30 tablet 5  . blood glucose meter kit and supplies KIT Dispense based on patient and insurance preference. Use up to four times daily as directed. (FOR ICD-9 250.00, 250.01). 1 each 0  . clopidogrel (PLAVIX) 75 MG tablet Take 1 tablet (75 mg total) by mouth daily. 30 tablet 0  . gabapentin (NEURONTIN) 300 MG capsule Take 2 capsules (600 mg total) by mouth at bedtime. 60 capsule 1  . glucose blood test strip Use as instructed 100 each 12  . insulin glargine (LANTUS) 100 UNIT/ML injection Inject 0.3 mLs (30 Units total) into the skin 2 (two) times daily. 3 vial 1  . isosorbide mononitrate (IMDUR) 60 MG 24 hr tablet Take 1 tablet (60 mg total) by mouth daily. 30 tablet 5  . losartan-hydrochlorothiazide (HYZAAR) 100-25 MG tablet Take 1 tablet by mouth daily. 30 tablet 5  . TRUEPLUS LANCETS 28G MISC 1 each by Does not apply route 3 (three) times daily before meals. 100 each 12   No facility-administered medications prior to visit.     PAST MEDICAL HISTORY: Past Medical History:  Diagnosis Date  . Diabetes mellitus type II, uncontrolled ([place])   . Diabetes mellitus without complication ([place])   . Fx humeral neck   . Hyperlipidemia LDL goal < 100   . Hypertension   . Leg pain    worse with prolonged standing  . Peripheral vascular disease ([place])   . Varicose veins     PAST SURGICAL HISTORY: Past Surgical History:  Procedure Laterality Date  . OTHER SURGICAL HISTORY  reports varicose vein procedure  . REVERSE SHOULDER ARTHROPLASTY Left 10/27/2014   Procedure: REVERSE SHOULDER ARTHROPLASTY;  Surgeon: Renette Butters, MD;  Location: [facility];  Service: Orthopedics;  Laterality: Left;  . 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 Cancer Brother   . Heart disease Sister     SOCIAL HISTORY: Social History   Social History  . Marital status: Single    Spouse name: N/A  . Number of children: N/A  . Years of education: N/A   Occupational History  . Not on file.   Social History Main Topics  . Smoking status: Never Smoker  . Smokeless tobacco: Never Used  . Alcohol use No  . Drug use: No  . Sexual activity: Not on file   Other Topics Concern  . Not on file   Social History Narrative   ** Merged History Encounter **       Lives with son [name] who comes with her to majority of visits and helps coordinate care for her. He translates for her and has signed and patient signed a release for this on 02/02/12.            PHYSICAL EXAM  There were no vitals filed for this visit. There is no height or weight on file to calculate BMI.  Generalized: Well developed, in no acute distress  Head: normocephalic and atraumatic,. Oropharynx benign  Neck: Supple, no carotid bruits  Cardiac: Regular rate rhythm, no murmur  Musculoskeletal: No deformity   Neurological examination   Mentation: Alert oriented to time, place, history taking. Attention span and concentration appropriate. Recent and remote memory intact.  Follows all commands speech and language fluent.   Cranial nerve II-XII: Fundoscopic exam reveals sharp disc margins.Pupils were equal round reactive to light extraocular movements were full, visual field were full on confrontational test. Facial sensation and strength were normal. hearing was intact to finger rubbing bilaterally. Uvula tongue midline. head turning and shoulder shrug were normal and symmetric.Tongue protrusion into cheek strength was normal. Motor: normal bulk and tone, full strength in the BUE, BLE, fine finger movements normal, no pronator drift. No focal weakness Sensory: normal and symmetric to  light touch, pinprick, and  Vibration, proprioception  Coordination: finger-nose-finger, heel-to-shin bilaterally, no dysmetria Reflexes: Brachioradialis 2/2, biceps 2/2, triceps 2/2, patellar 2/2, Achilles 2/2, plantar responses were flexor bilaterally. Gait and Station: Rising up from seated position without assistance, normal stance,  moderate stride, good arm swing, smooth turning, able to perform tiptoe, and heel walking without difficulty. Tandem gait is steady  DIAGNOSTIC DATA (LABS, IMAGING, TESTING) - I reviewed patient records, labs, notes, testing and imaging myself where available.  Lab Results  Component Value Date   WBC 8.2 11/06/2016   HGB 16.0 (H) 11/06/2016   HCT 47.0 (H) 11/06/2016   MCV 80.1 11/06/2016   PLT 218 11/06/2016      Component Value Date/Time   NA 133 (L) 11/06/2016 2104   NA 137 06/30/2016 1557   K 4.1 11/06/2016 2104   CL 96 (L) 11/06/2016 2104   CO2 27 11/06/2016 2050   GLUCOSE 452 (H) 11/06/2016 2104   BUN 32 (H) 11/06/2016 2104   BUN 21 06/30/2016 1557   CREATININE  1.00 11/06/2016 2104   CREATININE 0.71 01/07/2016 1230   CALCIUM 9.0 11/06/2016 2050   PROT 6.8 11/06/2016 2050   PROT 6.6 06/30/2016 1557   ALBUMIN 3.6 11/06/2016 2050   ALBUMIN 3.8 06/30/2016 1557   AST 19 11/06/2016 2050   ALT 16 11/06/2016 2050   ALKPHOS 104 11/06/2016 2050   BILITOT 0.6 11/06/2016 2050   BILITOT 0.3 06/30/2016 1557   GFRNONAA 53 (L) 11/06/2016 2050   GFRNONAA 85 01/07/2016 1230   GFRAA >60 11/06/2016 2050   GFRAA >89 01/07/2016 1230   Lab Results  Component Value Date   CHOL 123 11/08/2016   HDL 44 11/08/2016   LDLCALC 54 11/08/2016   TRIG 123 11/08/2016   CHOLHDL 2.8 11/08/2016   Lab Results  Component Value Date   HGBA1C 13.0 (H) 11/08/2016   No results found for: VITAMINB12 Lab Results  Component Value Date   TSH 1.832 04/29/2012    ***  ASSESSMENT AND PLAN  76 y.o. year old female  has a past medical history of Diabetes mellitus  type II, uncontrolled ([place]); Diabetes mellitus without complication ([place]); Fx humeral neck; Hyperlipidemia LDL goal < 100; Hypertension; Leg pain; Peripheral vascular disease ([place]); and Varicose veins. here with ***    Rayburn Ma, [facility], APRN  [facility] [address] [place], [place] [address] [phone]

## 2017-01-11 ENCOUNTER — Ambulatory Visit: Payer: Self-pay | Admitting: Nurse Practitioner

## 2017-01-12 ENCOUNTER — Encounter: Payer: Self-pay | Admitting: Nurse Practitioner

## 2017-01-23 MED FILL — AMLODIPINE BESYLATE 10 MG T: 10 | 30 days supply | Qty: 30 | Fill #2

## 2017-01-23 MED FILL — GABAPENTIN 300 MG CAPSULE: 300 | 30 days supply | Qty: 60 | Fill #1

## 2017-01-23 MED FILL — LOSARTAN-HCTZ 100-25 MG TAB: 100-25 | 30 days supply | Qty: 30 | Fill #2

## 2017-02-16 MED FILL — LOSARTAN-HCTZ 100-25 MG TAB: 100-25 | 30 days supply | Qty: 30 | Fill #3

## 2017-02-16 MED FILL — GABAPENTIN 300 MG CAPSULE: 300 | 30 days supply | Qty: 30 | Fill #1

## 2017-02-16 MED FILL — AMLODIPINE BESYLATE 10 MG T: 10 | 30 days supply | Qty: 30 | Fill #1

## 2017-04-17 MED FILL — TRUE METRIX TEST STRIP: 30 days supply | Qty: 100 | Fill #2

## 2017-04-17 MED FILL — ATORVASTATIN 80 MG TABLET: 80 | 30 days supply | Qty: 30 | Fill #2

## 2017-04-17 MED FILL — LOSARTAN-HCTZ 100-25 MG TAB: 100-25 | 30 days supply | Qty: 30 | Fill #4

## 2017-04-17 MED FILL — GABAPENTIN 300 MG CAPSULE: 300 | 30 days supply | Qty: 30 | Fill #2

## 2017-04-17 MED FILL — !LANTUS 100 UNITS/ML VIAL: 100 | 33 days supply | Qty: 20 | Fill #1

## 2017-04-17 MED FILL — AMLODIPINE BESYLATE 10 MG T: 10 | 30 days supply | Qty: 30 | Fill #2

## 2017-04-17 MED FILL — TRUEplus LANCETS 28G MISC: 30 days supply | Qty: 100 | Fill #2

## 2017-05-07 ENCOUNTER — Emergency Department (HOSPITAL_COMMUNITY): Payer: Medicaid Other

## 2017-05-07 ENCOUNTER — Encounter (HOSPITAL_COMMUNITY): Payer: Self-pay

## 2017-05-07 ENCOUNTER — Other Ambulatory Visit: Payer: Self-pay

## 2017-05-07 ENCOUNTER — Inpatient Hospital Stay (HOSPITAL_COMMUNITY)
Admission: EM | Admit: 2017-05-07 | Discharge: 2017-05-09 | DRG: 041 | Disposition: A | Discharge status: Home Health | Payer: Medicaid Other | Attending: Internal Medicine | Admitting: Internal Medicine

## 2017-05-07 DIAGNOSIS — Z7982 Long term (current) use of aspirin: Secondary | ICD-10-CM

## 2017-05-07 DIAGNOSIS — E785 Hyperlipidemia, unspecified: Secondary | ICD-10-CM | POA: Diagnosis present

## 2017-05-07 DIAGNOSIS — IMO0002 Reserved for concepts with insufficient information to code with codable children: Secondary | ICD-10-CM

## 2017-05-07 DIAGNOSIS — Z96612 Presence of left artificial shoulder joint: Secondary | ICD-10-CM | POA: Diagnosis present

## 2017-05-07 DIAGNOSIS — R29810 Facial weakness: Secondary | ICD-10-CM | POA: Diagnosis present

## 2017-05-07 DIAGNOSIS — Z794 Long term (current) use of insulin: Secondary | ICD-10-CM

## 2017-05-07 DIAGNOSIS — R630 Anorexia: Secondary | ICD-10-CM | POA: Diagnosis present

## 2017-05-07 DIAGNOSIS — R402362 Coma scale, best motor response, obeys commands, at arrival to emergency department: Secondary | ICD-10-CM | POA: Diagnosis present

## 2017-05-07 DIAGNOSIS — I129 Hypertensive chronic kidney disease with stage 1 through stage 4 chronic kidney disease, or unspecified chronic kidney disease: Secondary | ICD-10-CM | POA: Diagnosis present

## 2017-05-07 DIAGNOSIS — E1122 Type 2 diabetes mellitus with diabetic chronic kidney disease: Secondary | ICD-10-CM | POA: Diagnosis present

## 2017-05-07 DIAGNOSIS — Z6839 Body mass index (BMI) 39.0-39.9, adult: Secondary | ICD-10-CM

## 2017-05-07 DIAGNOSIS — R402142 Coma scale, eyes open, spontaneous, at arrival to emergency department: Secondary | ICD-10-CM | POA: Diagnosis present

## 2017-05-07 DIAGNOSIS — Z8673 Personal history of transient ischemic attack (TIA), and cerebral infarction without residual deficits: Secondary | ICD-10-CM

## 2017-05-07 DIAGNOSIS — R29701 NIHSS score 1: Secondary | ICD-10-CM | POA: Diagnosis present

## 2017-05-07 DIAGNOSIS — R402252 Coma scale, best verbal response, oriented, at arrival to emergency department: Secondary | ICD-10-CM | POA: Diagnosis present

## 2017-05-07 DIAGNOSIS — Z7901 Long term (current) use of anticoagulants: Secondary | ICD-10-CM

## 2017-05-07 DIAGNOSIS — Z8249 Family history of ischemic heart disease and other diseases of the circulatory system: Secondary | ICD-10-CM

## 2017-05-07 DIAGNOSIS — I63531 Cerebral infarction due to unspecified occlusion or stenosis of right posterior cerebral artery: Principal | ICD-10-CM | POA: Diagnosis present

## 2017-05-07 DIAGNOSIS — I10 Essential (primary) hypertension: Secondary | ICD-10-CM | POA: Diagnosis present

## 2017-05-07 DIAGNOSIS — E1151 Type 2 diabetes mellitus with diabetic peripheral angiopathy without gangrene: Secondary | ICD-10-CM | POA: Diagnosis present

## 2017-05-07 DIAGNOSIS — E669 Obesity, unspecified: Secondary | ICD-10-CM | POA: Diagnosis present

## 2017-05-07 DIAGNOSIS — I639 Cerebral infarction, unspecified: Secondary | ICD-10-CM | POA: Diagnosis present

## 2017-05-07 DIAGNOSIS — N179 Acute kidney failure, unspecified: Secondary | ICD-10-CM | POA: Diagnosis present

## 2017-05-07 DIAGNOSIS — E1165 Type 2 diabetes mellitus with hyperglycemia: Secondary | ICD-10-CM

## 2017-05-07 DIAGNOSIS — E1140 Type 2 diabetes mellitus with diabetic neuropathy, unspecified: Secondary | ICD-10-CM

## 2017-05-07 DIAGNOSIS — Z79899 Other long term (current) drug therapy: Secondary | ICD-10-CM

## 2017-05-07 HISTORY — DX: Cerebral infarction, unspecified: I63.9

## 2017-05-07 LAB — COMPREHENSIVE METABOLIC PANEL
ALK PHOS: 92 U/L (ref 38–126)
ALT: 18 U/L (ref 14–54)
ANION GAP: 16 — AB (ref 5–15)
AST: 21 U/L (ref 15–41)
Albumin: 3 g/dL — ABNORMAL LOW (ref 3.5–5.0)
BILIRUBIN TOTAL: 0.6 mg/dL (ref 0.3–1.2)
BUN: 51 mg/dL — ABNORMAL HIGH (ref 6–20)
CALCIUM: 8.9 mg/dL (ref 8.9–10.3)
CO2: 22 mmol/L (ref 22–32)
Chloride: 96 mmol/L — ABNORMAL LOW (ref 101–111)
Creatinine, Ser: 2.12 mg/dL — ABNORMAL HIGH (ref 0.44–1.00)
GFR calc non Af Amer: 22 mL/min — ABNORMAL LOW (ref >60)
GFR, EST AFRICAN AMERICAN: 25 mL/min — AB (ref >60)
GLUCOSE: 376 mg/dL — AB (ref 65–99)
Potassium: 4.2 mmol/L (ref 3.5–5.1)
Sodium: 134 mmol/L — ABNORMAL LOW (ref 135–145)
TOTAL PROTEIN: 7.2 g/dL (ref 6.5–8.1)

## 2017-05-07 LAB — DIFFERENTIAL
Basophils Absolute: 0 10*3/uL (ref 0.0–0.1)
Basophils Relative: 0 %
EOS ABS: 0 10*3/uL (ref 0.0–0.7)
EOS PCT: 0 %
LYMPHS ABS: 2.1 10*3/uL (ref 0.7–4.0)
LYMPHS PCT: 21 %
MONO ABS: 0.6 10*3/uL (ref 0.1–1.0)
Monocytes Relative: 6 %
Neutro Abs: 7.5 10*3/uL (ref 1.7–7.7)
Neutrophils Relative %: 73 %

## 2017-05-07 LAB — APTT: aPTT: 31 seconds (ref 24–36)

## 2017-05-07 LAB — I-STAT CHEM 8, ED
BUN: 44 mg/dL — AB (ref 6–20)
CALCIUM ION: 1.09 mmol/L — AB (ref 1.15–1.40)
Chloride: 97 mmol/L — ABNORMAL LOW (ref 101–111)
Creatinine, Ser: 1.9 mg/dL — ABNORMAL HIGH (ref 0.44–1.00)
Glucose, Bld: 374 mg/dL — ABNORMAL HIGH (ref 65–99)
HCT: 44 % (ref 36.0–46.0)
HEMOGLOBIN: 15 g/dL (ref 12.0–15.0)
Potassium: 4.2 mmol/L (ref 3.5–5.1)
SODIUM: 135 mmol/L (ref 135–145)
TCO2: 23 mmol/L (ref 22–32)

## 2017-05-07 LAB — I-STAT TROPONIN, ED: TROPONIN I, POC: 0.07 ng/mL (ref 0.00–0.08)

## 2017-05-07 LAB — CBC
HCT: 41.9 % (ref 36.0–46.0)
HEMOGLOBIN: 13.4 g/dL (ref 12.0–15.0)
MCH: 25.7 pg — AB (ref 26.0–34.0)
MCHC: 32 g/dL (ref 30.0–36.0)
MCV: 80.3 fL (ref 78.0–100.0)
Platelets: 272 10*3/uL (ref 150–400)
RBC: 5.22 MIL/uL — AB (ref 3.87–5.11)
RDW: 13.6 % (ref 11.5–15.5)
WBC: 10.2 10*3/uL (ref 4.0–10.5)

## 2017-05-07 LAB — PROTIME-INR
INR: 1.02
PROTHROMBIN TIME: 13.3 s (ref 11.4–15.2)

## 2017-05-07 MED ORDER — HEPARIN SODIUM (PORCINE) 5000 UNIT/ML IJ SOLN
5000.0000 [IU] | Freq: Three times a day (TID) | INTRAMUSCULAR | Status: DC
  Administered 2017-05-08 – 2017-05-09 (×3): 5000 [IU] via SUBCUTANEOUS
  Filled 2017-05-07 (×3): qty 1

## 2017-05-07 MED ORDER — ACETAMINOPHEN 650 MG RE SUPP: 650.0000 mg | RECTAL | Status: DC | PRN

## 2017-05-07 MED ORDER — ASPIRIN 300 MG RE SUPP: 300.0000 mg | Freq: Every day | RECTAL | Status: DC

## 2017-05-07 MED ORDER — SODIUM CHLORIDE 0.9 % IV SOLN
INTRAVENOUS | Status: AC
  Administered 2017-05-08: 03:00:00 via INTRAVENOUS

## 2017-05-07 MED ORDER — ASPIRIN 325 MG PO TABS
325.0000 mg | ORAL_TABLET | Freq: Every day | ORAL | Status: DC
  Administered 2017-05-08: 325 mg via ORAL
  Filled 2017-05-07: qty 1

## 2017-05-07 MED ORDER — SENNOSIDES-DOCUSATE SODIUM 8.6-50 MG PO TABS: 1.0000 | ORAL_TABLET | Freq: Every evening | ORAL | Status: DC | PRN

## 2017-05-07 MED ORDER — INSULIN ASPART 100 UNIT/ML ~~LOC~~ SOLN
0.0000 [IU] | SUBCUTANEOUS | Status: DC
  Administered 2017-05-08: 5 [IU] via SUBCUTANEOUS
  Administered 2017-05-08: 2 [IU] via SUBCUTANEOUS
  Administered 2017-05-08 (×2): 3 [IU] via SUBCUTANEOUS
  Administered 2017-05-08: 5 [IU] via SUBCUTANEOUS
  Administered 2017-05-09 (×2): 1 [IU] via SUBCUTANEOUS
  Administered 2017-05-09 (×2): 2 [IU] via SUBCUTANEOUS
  Filled 2017-05-07 (×3): qty 1

## 2017-05-07 MED ORDER — ISOSORBIDE MONONITRATE ER 60 MG PO TB24
60.0000 mg | ORAL_TABLET | Freq: Every day | ORAL | Status: DC
Start: 2017-05-08 — End: 2017-05-09
  Administered 2017-05-08: 60 mg via ORAL
  Filled 2017-05-07: qty 1

## 2017-05-07 MED ORDER — STROKE: EARLY STAGES OF RECOVERY BOOK
Freq: Once | Status: DC
  Filled 2017-05-07: qty 1

## 2017-05-07 MED ORDER — CLOPIDOGREL BISULFATE 75 MG PO TABS
75.0000 mg | ORAL_TABLET | Freq: Every day | ORAL | Status: DC
  Administered 2017-05-08: 75 mg via ORAL
  Filled 2017-05-07: qty 1

## 2017-05-07 MED ORDER — ACETAMINOPHEN 160 MG/5ML PO SOLN: 650.0000 mg | ORAL | Status: DC | PRN

## 2017-05-07 MED ORDER — INSULIN GLARGINE 100 UNIT/ML ~~LOC~~ SOLN
15.0000 [IU] | Freq: Two times a day (BID) | SUBCUTANEOUS | Status: DC
  Administered 2017-05-08 (×3): 15 [IU] via SUBCUTANEOUS
  Filled 2017-05-07 (×7): qty 0.15

## 2017-05-07 MED ORDER — ATORVASTATIN CALCIUM 80 MG PO TABS
80.0000 mg | ORAL_TABLET | Freq: Every day | ORAL | Status: DC
  Administered 2017-05-08 – 2017-05-09 (×2): 80 mg via ORAL
  Filled 2017-05-07 (×3): qty 1

## 2017-05-07 MED ORDER — GABAPENTIN 300 MG PO CAPS
600.0000 mg | ORAL_CAPSULE | Freq: Every day | ORAL | Status: DC
  Administered 2017-05-08: 600 mg via ORAL
  Filled 2017-05-07: qty 2

## 2017-05-07 MED ORDER — ACETAMINOPHEN 325 MG PO TABS
650.0000 mg | ORAL_TABLET | ORAL | Status: DC | PRN
  Filled 2017-05-07: qty 2

## 2017-05-07 NOTE — ED Provider Notes (Signed)
[facility] EMERGENCY DEPARTMENT Provider Note   CSN: [id number] Arrival date & time: 05/07/17  1410     History   Chief Complaint Chief Complaint  Patient presents with  . Cerebrovascular Accident    HPI Meghan Ford is a 76 y.o. female.  Patient brought to the ER by family for evaluation of left-sided weakness.  Symptoms began 2 or 3 days ago.  Family report that she fell 3 days ago but there was no injury.  The next day the family noticed that the left side of her face was drooping and her speech was slurred.  Patient reports that in addition to this, her tongue and left side of face are numb, left arm and leg are numb and the left arm is very weak.  She does not have a headache.  She has not had any recent illness.      Past Medical History:  Diagnosis Date  . Diabetes mellitus type II, uncontrolled (HCC)   . Diabetes mellitus without complication (HCC)   . Fx humeral neck   . Hyperlipidemia LDL goal < 100   . Hypertension   . Leg pain    worse with prolonged standing  . Peripheral vascular disease (HCC)   . Varicose veins     Patient Active Problem List   Diagnosis Date Noted  . History of CVA (cerebrovascular accident) 05/07/2017  . Stroke (HCC) 05/07/2017  . Intracranial vascular stenosis   . TIA (transient ischemic attack) 11/07/2016  . Insulin dependent type 2 diabetes mellitus, uncontrolled (HCC) 11/07/2016  . HLD (hyperlipidemia) 11/07/2016  . HTN (hypertension) 11/07/2016  . 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 Noncompliance with diet and medication regimen 08/26/2015  . S/p reverse total shoulder arthroplasty   . Leukocytosis   . Proximal humerus fracture 10/26/2014  . Hyperlipidemia 10/23/2014  . Hypokalemia 10/09/2014  . Essential hypertension 10/09/2014  . Protein-calorie malnutrition, severe (HCC) 09/29/2014  . Sepsis (HCC) 09/28/2014  . Fever 09/28/2014  .  Headache 09/28/2014  . Diabetes mellitus with neuropathy (HCC)   . Pyrexia   . Uncontrolled hypertension 10/15/2012  . Neuropathic pain of both legs 10/15/2012  . Palpitations 05/21/2012  . Dyslipidemia 05/15/2012  . Hypertension   . Hyperlipidemia LDL goal < 100   . Diabetes mellitus type II, uncontrolled (HCC)   . Varicose veins of lower extremities with other complications 11/07/2011    Past Surgical History:  Procedure Laterality Date  . OTHER SURGICAL HISTORY     reports varicose vein procedure  . REVERSE SHOULDER ARTHROPLASTY Left 10/27/2014   Procedure: REVERSE SHOULDER ARTHROPLASTY;  Surgeon: Timothy D Murphy, MD;  Location: [facility] OR;  Service: Orthopedics;  Laterality: Left;  . TOTAL SHOULDER ARTHROPLASTY Left 10/27/2014   Procedure: TOTAL SHOULDER ARTHROPLASTY;  Surgeon: Timothy D Murphy, MD;  Location: [facility] OR;  Service: Orthopedics;  Laterality: Left;  . VEIN SURGERY     left leg    OB History    Gravida Para Term Preterm AB Living   0 0 0 0 0     SAB TAB Ectopic Multiple Live Births   0 0 0           Home Medications    Prior to Admission medications   Medication Sig Start Date End Date Taking? Authorizing Provider  aspirin 325 MG tablet Take 1 tablet (325 mg total) by mouth daily. 11/09/16   Akula, Vijaya, MD  atorvastatin (LIPITOR)   80 MG tablet Take 1 tablet (80 mg total) by mouth daily. 10/04/16   Newlin, Enobong, MD  blood glucose meter kit and supplies KIT Dispense based on patient and insurance preference. Use up to four times daily as directed. (FOR ICD-9 250.00, 250.01). 11/09/16   Akula, Vijaya, MD  clopidogrel (PLAVIX) 75 MG tablet Take 1 tablet (75 mg total) by mouth daily. 11/10/16   Akula, Vijaya, MD  gabapentin (NEURONTIN) 300 MG capsule Take 2 capsules (600 mg total) by mouth at bedtime. 11/09/16   Akula, Vijaya, MD  glucose blood test strip Use as instructed 11/09/16   Akula, Vijaya, MD  insulin glargine (LANTUS) 100 UNIT/ML injection Inject 0.3 mLs (30  Units total) into the skin 2 (two) times daily. 11/09/16   Akula, Vijaya, MD  isosorbide mononitrate (IMDUR) 60 MG 24 hr tablet Take 1 tablet (60 mg total) by mouth daily. 10/04/16   Newlin, Enobong, MD  losartan-hydrochlorothiazide (HYZAAR) 100-25 MG tablet Take 1 tablet by mouth daily. 10/04/16   Newlin, Enobong, MD  TRUEPLUS LANCETS 28G MISC 1 each by Does not apply route 3 (three) times daily before meals. 11/09/16   Akula, Vijaya, MD  hydrALAZINE (APRESOLINE) 10 MG tablet Take 1 tablet (10 mg total) by mouth 3 (three) times daily. 02/22/12 05/20/12  Dhungel, Nishant, MD  insulin NPH (HUMULIN N,NOVOLIN N) 100 UNIT/ML injection Inject 18 Units into the skin 2 (two) times daily before a meal. 02/22/12 04/29/12  Dhungel, Nishant, MD  insulin NPH-insulin regular (NOVOLIN 70/30) (70-30) 100 UNIT/ML injection Inject 20 Units into the skin 2 (two) times daily with a meal. Please provide one-month supply, provide patient syringes and needles. 05/15/12 05/20/12  Singh, Prashant K, MD    Family History Family History  Problem Relation Age of Onset  . Cancer Brother   . Heart disease Sister     Social History Social History   Tobacco Use  . Smoking status: Never Smoker  . Smokeless tobacco: Never Used  Substance Use Topics  . Alcohol use: No  . Drug use: No     Allergies   Patient has no known allergies.   Review of Systems Review of Systems  Neurological: Positive for speech difficulty, weakness and numbness.  All other systems reviewed and are negative.    Physical Exam Updated Vital Signs BP 140/61   Pulse 76   Temp 98.8 F (37.1 C)   Resp (!) 27   Wt 103.9 kg (229 lb)   SpO2 96%   BMI 39.31 kg/m   Physical Exam  Constitutional: She is oriented to person, place, and time. She appears well-developed and well-nourished. No distress.  HENT:  Head: Normocephalic and atraumatic.  Right Ear: Hearing normal.  Left Ear: Hearing normal.  Nose: Nose normal.  Mouth/Throat:  Oropharynx is clear and moist and mucous membranes are normal.  Eyes: Conjunctivae and EOM are normal. Pupils are equal, round, and reactive to light.  Neck: Normal range of motion. Neck supple.  Cardiovascular: Regular rhythm, S1 normal and S2 normal. Exam reveals no gallop and no friction rub.  No murmur heard. Pulmonary/Chest: Effort normal and breath sounds normal. No respiratory distress. She exhibits no tenderness.  Abdominal: Soft. Normal appearance and bowel sounds are normal. There is no hepatosplenomegaly. There is no tenderness. There is no rebound, no guarding, no tenderness at McBurney's point and negative Murphy's sign. No hernia.  Musculoskeletal: Normal range of motion.  Neurological: She is alert and oriented to person, place, and time. A cranial   nerve deficit (left facial droop) is present. No sensory deficit (left hemiparesis). Coordination normal. GCS eye subscore is 4. GCS verbal subscore is 5. GCS motor subscore is 6.  Left upper extremity strength 3+ out of 5 Right upper extremity strength 3+ out of 5  Left lower extremity strength 4 out of 5 Right lower extremity strength 4 out of 5  Decreased sensation to light touch and pinprick left upper and left lower extremity  Skin: Skin is warm, dry and intact. No rash noted. No cyanosis.  Psychiatric: She has a normal mood and affect. Her speech is normal and behavior is normal. Thought content normal.  Nursing note and vitals reviewed.    ED Treatments / Results  Labs (all labs ordered are listed, but only abnormal results are displayed) Labs Reviewed  CBC - Abnormal; Notable for the following components:      Result Value   RBC 5.22 (*)    MCH 25.7 (*)    All other components within normal limits  COMPREHENSIVE METABOLIC PANEL - Abnormal; Notable for the following components:   Sodium 134 (*)    Chloride 96 (*)    Glucose, Bld 376 (*)    BUN 51 (*)    Creatinine, Ser 2.12 (*)    Albumin 3.0 (*)    GFR calc non  Af Amer 22 (*)    GFR calc Af Amer 25 (*)    Anion gap 16 (*)    All other components within normal limits  I-STAT CHEM 8, ED - Abnormal; Notable for the following components:   Chloride 97 (*)    BUN 44 (*)    Creatinine, Ser 1.90 (*)    Glucose, Bld 374 (*)    Calcium, Ion 1.09 (*)    All other components within normal limits  PROTIME-INR  APTT  DIFFERENTIAL  HEMOGLOBIN A1C  LIPID PANEL  SODIUM, URINE, RANDOM  UREA NITROGEN, URINE  CREATININE, URINE, RANDOM  I-STAT TROPONIN, ED  CBG MONITORING, ED    EKG  EKG Interpretation  Date/Time:  Monday May 07 2017 23:29:01 EST Ventricular Rate:  78 PR Interval:    QRS Duration: 97 QT Interval:  391 QTC Calculation: 446 R Axis:   34 Text Interpretation:  Sinus rhythm Inferior infarct, old No significant change since last tracing Confirmed by ,  J ([id number]) on 05/08/2017 12:10:52 AM       Radiology Ct Head Wo Contrast  Result Date: 05/07/2017 CLINICAL DATA:  Left facial droop with left sided weakness and numbness and decreased vision in the left eye for 3 days. Last seen normal 05/02/2017. Transient aphasia. EXAM: CT HEAD WITHOUT CONTRAST TECHNIQUE: Contiguous axial images were obtained from the base of the skull through the vertex without intravenous contrast. COMPARISON:  MRI brain 11/07/2016.  CT 11/06/2016. FINDINGS: Brain: Interval development of extensive encephalomalacia in the posterior right parietal and occipital lobes consistent with a late subacute cortical infarct. Patchy low-density in the posterior right periventricular white matter corresponds with the areas of clustered restricted diffusion on previous MRI. No definite signs of acute stroke. There is no evidence of acute intracranial hemorrhage, mass effect, midline shift or hydrocephalus. Vascular: Intracranial vascular calcifications. No hyperdense vessel identified. Skull: Negative for fracture or focal lesion. Sinuses/Orbits: The visualized  paranasal sinuses and mastoid air cells are clear. No orbital abnormalities are seen. Other: None. IMPRESSION: 1. Since previous studies of 6 months ago, the patient has developed extensive cortical infarcts in the posterior right parietal and occipital   lobes consistent with late subacute PCA distribution infarcts. Watershed infarcts are also present in the deep periventricular white matter. 2. No definite signs of acute stroke or hemorrhage. Electronically Signed   By: William  Veazey M.D.   On: 05/07/2017 18:27    Procedures Procedures (including critical care time)  Medications Ordered in ED Medications  atorvastatin (LIPITOR) tablet 80 mg (not administered)  clopidogrel (PLAVIX) tablet 75 mg (not administered)  gabapentin (NEURONTIN) capsule 600 mg (not administered)  isosorbide mononitrate (IMDUR) 24 hr tablet 60 mg (not administered)   stroke: mapping our early stages of recovery book (not administered)  0.9 %  sodium chloride infusion (not administered)  acetaminophen (TYLENOL) tablet 650 mg (not administered)    Or  acetaminophen (TYLENOL) solution 650 mg (not administered)    Or  acetaminophen (TYLENOL) suppository 650 mg (not administered)  senna-docusate (Senokot-S) tablet 1 tablet (not administered)  insulin glargine (LANTUS) injection 15 Units (not administered)  insulin aspart (novoLOG) injection 0-9 Units (not administered)  heparin injection 5,000 Units (not administered)  aspirin suppository 300 mg (not administered)    Or  aspirin tablet 325 mg (not administered)     Initial Impression / Assessment and Plan / ED Course  I have reviewed the triage vital signs and the nursing notes.  Pertinent labs & imaging results that were available during my care of the patient were reviewed by me and considered in my medical decision making (see chart for details).     Patient presents to the emergency department for evaluation of stroke symptoms.  Symptoms were first noticed  2 days ago by family.  Stroke workup was initiated in triage.  CT scan shows evidence of extensive cortical infarcts in the posterior right parietal and occipital lobes consistent with late subacute PCA distribution infarcts and watershed infarcts present in the deep periventricular white matter.  Patient will require hospitalization for further workup.  Discussed with Dr. Lindzen, neurology.  He will see the patient in consultation.  Will admit to hospitalist service.  Final Clinical Impressions(s) / ED Diagnoses   Final diagnoses:  Cerebrovascular accident (CVA), unspecified mechanism (HCC)    ED Discharge Orders    None       ,  J, MD 05/08/17 0011  

## 2017-05-07 NOTE — ED Triage Notes (Signed)
Pt presents to the ed with complaints of stroke like symptoms x 3 days. Last seen normal 05/02/2017.  Symptoms are left sided facial droop, left sided weakness, left sided numbness and decreased vision in left eye. Pt also had aphasia which has subsided.

## 2017-05-07 NOTE — H&P (Signed)
History and Physical    Meghan Ford LZJ:[id number] DOB: 09-25-1940 DOA: 05/07/2017  PCP: Charlott Rakes, MD   Patient coming from: Home  Chief Complaint: Left-sided weakness, left facial droop, speech difficulty   HPI: Meghan Ford is a 77 y.o. female with medical history significant for insulin-dependent diabetes mellitus, hypertension, and history of CVA, now presenting to the emergency department for evaluation of left-sided weakness and numbness, left facial droop, and speech difficulty for the past 3 days.  Patient reportedly had a fall at home on 05/04/2017 without striking her head or losing consciousness.  Following morning, family noted that she had a facial droop on the left and was weak throughout her left side.  She also had difficulty with her speech and reported a vision change involving the left eye.  She denies recent fevers or chills, and denies chest pain or palpitations.  Speech difficulty has essentially resolved per the report of family, but focal weakness remains.  ED Course: Upon arrival to the ED, patient is found to be afebrile, saturating well on room air, and with vitals otherwise normal.  EKG features a sinus rhythm with nonspecific ST-T abnormality.  Noncontrast head CT reveals extensive cortical infarcts in the posterior right parietal and occipital lobes concerning for late subacute PCA distribution watershed infarcts.  Chemistry panel is notable for a creatinine of 2.2, up from 1.00 last summer.  Serum glucose is elevated to 372.  CBC is unremarkable and troponin is within the normal limits.  Neurology was consulted by the ED physician and recommended medical admission for further evaluation and management of new stroke.  Review of Systems:  All other systems reviewed and apart from HPI, are negative.  Past Medical History:  Diagnosis Date  . Diabetes mellitus type II, uncontrolled ([place])   . Diabetes mellitus without complication ([place])     . Fx humeral neck   . Hyperlipidemia LDL goal < 100   . Hypertension   . Leg pain    worse with prolonged standing  . Peripheral vascular disease ([place])   . Varicose veins     Past Surgical History:  Procedure Laterality Date  . OTHER SURGICAL HISTORY     reports varicose vein procedure  . REVERSE SHOULDER ARTHROPLASTY Left 10/27/2014   Procedure: REVERSE SHOULDER ARTHROPLASTY;  Surgeon: Renette Butters, MD;  Location: [place];  Service: Orthopedics;  Laterality: Left;  . TOTAL SHOULDER ARTHROPLASTY Left 10/27/2014   Procedure: TOTAL SHOULDER ARTHROPLASTY;  Surgeon: Renette Butters, MD;  Location: [place];  Service: Orthopedics;  Laterality: Left;  Marland Kitchen VEIN SURGERY     left leg     reports that  has never smoked. she has never used smokeless tobacco. She reports that she does not drink alcohol or use drugs.  No Known Allergies  Family History  Problem Relation Age of Onset  . Cancer Brother   . Heart disease Sister      Prior to Admission medications   Medication Sig Start Date End Date Taking? Authorizing Provider  aspirin 325 MG tablet Take 1 tablet (325 mg total) by mouth daily. 11/09/16   Hosie Poisson, MD  atorvastatin (LIPITOR) 80 MG tablet Take 1 tablet (80 mg total) by mouth daily. 10/04/16   Charlott Rakes, MD  blood glucose meter kit and supplies KIT Dispense based on patient and insurance preference. Use up to four times daily as directed. (FOR ICD-9 250.00, 250.01). 11/09/16   Hosie Poisson, MD  clopidogrel (PLAVIX) 75 MG tablet  Take 1 tablet (75 mg total) by mouth daily. 11/10/16   Hosie Poisson, MD  gabapentin (NEURONTIN) 300 MG capsule Take 2 capsules (600 mg total) by mouth at bedtime. 11/09/16   Hosie Poisson, MD  glucose blood test strip Use as instructed 11/09/16   Hosie Poisson, MD  insulin glargine (LANTUS) 100 UNIT/ML injection Inject 0.3 mLs (30 Units total) into the skin 2 (two) times daily. 11/09/16   Hosie Poisson, MD  isosorbide mononitrate (IMDUR) 60 MG 24 hr  tablet Take 1 tablet (60 mg total) by mouth daily. 10/04/16   Charlott Rakes, MD  losartan-hydrochlorothiazide (HYZAAR) 100-25 MG tablet Take 1 tablet by mouth daily. 10/04/16   Charlott Rakes, MD  TRUEPLUS LANCETS 28G MISC 1 each by Does not apply route 3 (three) times daily before meals. 11/09/16   Hosie Poisson, MD  hydrALAZINE (APRESOLINE) 10 MG tablet Take 1 tablet (10 mg total) by mouth 3 (three) times daily. 02/22/12 05/20/12  Dhungel, Flonnie Overman, MD  insulin NPH (HUMULIN N,NOVOLIN N) 100 UNIT/ML injection Inject 18 Units into the skin 2 (two) times daily before a meal. 02/22/12 04/29/12  Dhungel, Nishant, MD  insulin NPH-insulin regular (NOVOLIN 70/30) (70-30) 100 UNIT/ML injection Inject 20 Units into the skin 2 (two) times daily with a meal. Please provide one-month supply, provide patient syringes and needles. 05/15/12 05/20/12  Thurnell Lose, MD    Physical Exam: Vitals:   05/07/17 1529 05/07/17 2050 05/07/17 2315 05/07/17 2347  BP:  138/79 140/61   Pulse:  79 76   Resp:  18 (!) 27   Temp:    98.8 F (37.1 C)  TempSrc:      SpO2:  99% 96%   Weight: 103.9 kg (229 lb)         Constitutional: NAD, calm, obese Eyes: PERTLA, lids and conjunctivae normal ENMT: Mucous membranes are moist. Posterior pharynx clear of any exudate or lesions.   Neck: normal, supple, no masses, no thyromegaly Respiratory: clear to auscultation bilaterally, no wheezing, no crackles. Normal respiratory effort. No accessory muscle use.  Cardiovascular: S1 & S2 heard, regular rate and rhythm. No significant JVD. Abdomen: No distension, no tenderness, no masses palpated. Bowel sounds normal.  Musculoskeletal: no clubbing / cyanosis. No joint deformity upper and lower extremities. Normal muscle tone.  Skin: no significant rashes, lesions, ulcers. Warm, dry, well-perfused. Neurologic: PERRL, EOMI. Sensation to light touch diminished on left. RUE and RLE strength 5/5. LUE and LLE strength 3-4/5. Psychiatric:  Alert and oriented x 3. Calm, cooperative.     Labs on Admission: I have personally reviewed following labs and imaging studies  CBC: Recent Labs  Lab 05/07/17 1545 05/07/17 1618  WBC 10.2  --   NEUTROABS 7.5  --   HGB 13.4 15.0  HCT 41.9 44.0  MCV 80.3  --   PLT 272  --    Basic Metabolic Panel: Recent Labs  Lab 05/07/17 1545 05/07/17 1618  NA 134* 135  K 4.2 4.2  CL 96* 97*  CO2 22  --   GLUCOSE 376* 374*  BUN 51* 44*  CREATININE 2.12* 1.90*  CALCIUM 8.9  --    GFR: CrCl cannot be calculated (Unknown ideal weight.). Liver Function Tests: Recent Labs  Lab 05/07/17 1545  AST 21  ALT 18  ALKPHOS 92  BILITOT 0.6  PROT 7.2  ALBUMIN 3.0*   No results for input(s): LIPASE, AMYLASE in the last 168 hours. No results for input(s): AMMONIA in the last 168 hours. Coagulation  Profile: Recent Labs  Lab 05/07/17 1545  INR 1.02   Cardiac Enzymes: No results for input(s): CKTOTAL, CKMB, CKMBINDEX, TROPONINI in the last 168 hours. BNP (last 3 results) No results for input(s): PROBNP in the last 8760 hours. HbA1C: No results for input(s): HGBA1C in the last 72 hours. CBG: No results for input(s): GLUCAP in the last 168 hours. Lipid Profile: No results for input(s): CHOL, HDL, LDLCALC, TRIG, CHOLHDL, LDLDIRECT in the last 72 hours. Thyroid Function Tests: No results for input(s): TSH, T4TOTAL, FREET4, T3FREE, THYROIDAB in the last 72 hours. Anemia Panel: No results for input(s): VITAMINB12, FOLATE, FERRITIN, TIBC, IRON, RETICCTPCT in the last 72 hours. Urine analysis:    Component Value Date/Time   COLORURINE YELLOW 10/27/2014 2321   APPEARANCEUR CLEAR 10/27/2014 2321   LABSPEC 1.012 10/27/2014 2321   PHURINE 7.0 10/27/2014 2321   GLUCOSEU NEGATIVE 10/27/2014 2321   HGBUR NEGATIVE 10/27/2014 2321   BILIRUBINUR negative 08/25/2015 1515   KETONESUR NEGATIVE 10/27/2014 2321   PROTEINUR 100 08/25/2015 1515   PROTEINUR NEGATIVE 10/27/2014 2321   UROBILINOGEN  1.0 08/25/2015 1515   UROBILINOGEN 0.2 10/27/2014 2321   NITRITE negative 08/25/2015 1515   NITRITE NEGATIVE 10/27/2014 2321   LEUKOCYTESUR Negative 08/25/2015 1515   Sepsis Labs: '@LABRCNTIP'$ (procalcitonin:4,lacticidven:4) )No results found for this or any previous visit (from the past 240 hour(s)).   Radiological Exams on Admission: Ct Head Wo Contrast  Result Date: 05/07/2017 CLINICAL DATA:  Left facial droop with left sided weakness and numbness and decreased vision in the left eye for 3 days. Last seen normal 05/02/2017. Transient aphasia. EXAM: CT HEAD WITHOUT CONTRAST TECHNIQUE: Contiguous axial images were obtained from the base of the skull through the vertex without intravenous contrast. COMPARISON:  MRI brain 11/07/2016.  CT 11/06/2016. FINDINGS: Brain: Interval development of extensive encephalomalacia in the posterior right parietal and occipital lobes consistent with a late subacute cortical infarct. Patchy low-density in the posterior right periventricular white matter corresponds with the areas of clustered restricted diffusion on previous MRI. No definite signs of acute stroke. There is no evidence of acute intracranial hemorrhage, mass effect, midline shift or hydrocephalus. Vascular: Intracranial vascular calcifications. No hyperdense vessel identified. Skull: Negative for fracture or focal lesion. Sinuses/Orbits: The visualized paranasal sinuses and mastoid air cells are clear. No orbital abnormalities are seen. Other: None. IMPRESSION: 1. 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 Watershed infarcts are also present in the deep periventricular white matter. 2. No definite signs of acute stroke or hemorrhage. Electronically Signed   By: Richardean Sale M.D.   On: 05/07/2017 18:27    EKG: Independently reviewed. Sinus rhythm,  non-specific ST-T abnormality.   Assessment/Plan   1. Subacute CVA; hx of CVA  - Presents with 3 days of left-sided weakness, left facial droop, and speech difficulty  - CT head with extensive infarcts involving posterior right parietal and occipital lobes concerning for subacute watershed infarction  - Neurology is consulting and much appreciated  - Check MRI brain, MRA head, carotid dopplers, echo, fasting lipid panel, and A1c  - Continue cardiac monitoring, frequent neuro checks, PT/OT/SLP evals    2. Uncontrolled type II DM  - A1c was 13.0% in August 2018  - Managed at home with Lantus 30 units BID  - Check CBG's, continue Lantus with 15 units BID, start SSI with Novolog    3. Hypertension  -  BP at goal  - Losartan-HCTZ and hydralazine held in light of suspected watershed infarcts    4. Acute kidney injury  - SCr is 2.12 on admission, up from 1.00 in August 2018  - Likely prerenal in setting of recent poor appetite/not eating or drinking  - Check urine studies, hydrate with NS, hold losartan-HCTZ    DVT prophylaxis: sq heparin  Code Status: Full  Family Communication: Son and daughter updated at bedside Disposition Plan: Observe on telemetry Consults called: Neurology  Admission status: Observation     Vianne Bulls, MD [facility] Pager [phone]  If 7PM-7AM, please contact night-coverage [email or web address] Password [facility]  05/07/2017, 11:58 PM

## 2017-05-07 NOTE — Consult Note (Signed)
Referring Physician: Dr. Betsey Holiday    Chief Complaint: Left sided weakness  HPI: Meghan Ford is an 77 y.o. female presenting with a 2-3 day history of new onset left sided weakness.   Dr. Rosalene Billings note was reviewed: "Patient brought to the ER by family for evaluation of left-sided weakness. Symptoms began 2 or 3 days ago.  Family report that she fell 3 days ago but there was no injury.  The next day the family noticed that the left side of her face was drooping and her speech was slurred.  Patient reports that in addition to this, her tongue and left side of face are numb, left arm and leg are numb and the left arm is very weak.  She does not have a headache.  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 Watershed infarcts are also present in the deep periventricular white matter.  Home medications include ASA, Plavix and atorvastatin.   Past Medical History:  Diagnosis Date  . Diabetes mellitus type II, uncontrolled ([place])   . Diabetes mellitus without complication ([place])   . Fx humeral neck   . Hyperlipidemia LDL goal < 100   . Hypertension   . Leg pain    worse with prolonged standing  . Peripheral vascular disease ([place])   . Varicose veins     Past Surgical History:  Procedure Laterality Date  . OTHER SURGICAL HISTORY     reports varicose vein procedure  . REVERSE SHOULDER ARTHROPLASTY Left 10/27/2014   Procedure: REVERSE SHOULDER ARTHROPLASTY;  Surgeon: Renette Butters, MD;  Location: [facility];  Service: Orthopedics;  Laterality: Left;  . TOTAL SHOULDER ARTHROPLASTY Left 10/27/2014   Procedure: TOTAL SHOULDER ARTHROPLASTY;  Surgeon: Renette Butters, MD;  Location: [facility];  Service: Orthopedics;  Laterality: Left;  Marland Kitchen VEIN SURGERY     left leg    Family History    Problem Relation Age of Onset  . Cancer Brother   . Heart disease Sister    Social History:  reports that  has never smoked. she has never used smokeless tobacco. She reports that she does not drink alcohol or use drugs.  Allergies: No Known Allergies  Home Medications:    ROS: As per HPI.   Physical Examination: Blood pressure 138/79, pulse 79, temperature 98.8 F (37.1 C), temperature source Oral, resp. rate 18, weight 103.9 kg (229 lb), SpO2 99 %.  HEENT: [place]/AT Lungs: Respirations unlabored Ext: Trophic changes below knees bilaterally  Neurologic Examination: Mental Status: Drowsy in the context of early AM evaluation. Able to answer simple questions and follow basic motor commands in English as well as in [place]. Spanish is her primary language.  Cranial Nerves: II:  Left visual field cut noted. PERRL.  III,IV, VI: Unable to cross more than 2 mm past midline with conjugate gaze to left. No nystagmus.  V,VII: No definite left facial droop. States temp sensation equal bilaterally VIII: hearing intact to voice IX,X: Mild hypophonia XI: Symmetric XII: midline tongue extension without atrophy or fasciculations Motor: RUE 5/5 LUE subtle deficit rated at 4+/5 RLE: 5/5 LLE: 4/5 Sensory: Reacts to FT bilateral upper and lower extremities. Subjectively equal sensation to cold bilateral proximal lower and upper extremities Deep Tendon Reflexes:  2+ biceps, brachioradialis and patellae bilaterally 0 achilles bilaterally Plantars: Right: Equivocal  Left: Upgoing Cerebellar: Slow FNF on right without ataxia. Has difficulty performing on left.  Gait: Deferred due to falls risk concerns  Results for orders placed or performed during the hospital encounter of 05/07/17 (from the past 48 hour(s))  Protime-INR     Status: None   Collection Time: 05/07/17  3:45 PM  Result Value Ref Range   Prothrombin Time 13.3 11.4 - 15.2 seconds   INR 1.02     Comment: Performed at [facility], [place] [address] [place], [place] [address]  APTT     Status: None   Collection Time: 05/07/17  3:45 PM  Result Value Ref Range   aPTT 31 24 - 36 seconds    Comment: Performed at [facility] [address], [place], [place] [address]  CBC     Status: Abnormal   Collection Time: 05/07/17  3:45 PM  Result Value Ref Range   WBC 10.2 4.0 - 10.5 K/uL   RBC 5.22 (H) 3.87 - 5.11 MIL/uL   Hemoglobin 13.4 12.0 - 15.0 g/dL   HCT 41.9 36.0 - 46.0 %   MCV 80.3 78.0 - 100.0 fL   MCH 25.7 (L) 26.0 - 34.0 pg   MCHC 32.0 30.0 - 36.0 g/dL   RDW 13.6 11.5 - 15.5 %   Platelets 272 150 - 400 K/uL    Comment: Performed at [facility] [address], [place], [place] [address]  Differential     Status: None   Collection Time: 05/07/17  3:45 PM  Result Value Ref Range   Neutrophils Relative % 73 %   Neutro Abs 7.5 1.7 - 7.7 K/uL   Lymphocytes Relative 21 %   Lymphs Abs 2.1 0.7 - 4.0 K/uL   Monocytes Relative 6 %   Monocytes Absolute 0.6 0.1 - 1.0 K/uL   Eosinophils Relative 0 %   Eosinophils Absolute 0.0 0.0 - 0.7 K/uL   Basophils Relative 0 %   Basophils Absolute 0.0 0.0 - 0.1 K/uL    Comment: Performed at [facility] [address] [place], [place] [address]  Comprehensive metabolic panel     Status: Abnormal   Collection Time: 05/07/17  3:45 PM  Result Value Ref Range   Sodium 134 (L) 135 - 145 mmol/L   Potassium 4.2 3.5 - 5.1 mmol/L   Chloride 96 (L) 101 - 111 mmol/L   CO2 22 22 - 32 mmol/L   Glucose, Bld 376 (H) 65 - 99 mg/dL   BUN 51 (H) 6 - 20 mg/dL   Creatinine, Ser 2.12 (H) 0.44 - 1.00 mg/dL   Calcium 8.9 8.9 - 10.3 mg/dL   Total Protein 7.2 6.5 - 8.1 g/dL   Albumin 3.0 (L) 3.5 - 5.0 g/dL   AST 21 15 - 41 U/L   ALT 18 14 - 54 U/L   Alkaline Phosphatase 92 38 - 126 U/L   Total Bilirubin 0.6 0.3 - 1.2 mg/dL   GFR calc non Af Amer 22 (L) >60 mL/min   GFR calc Af Amer 25 (L) >60 mL/min    Comment: (NOTE) The eGFR has been calculated using the CKD  EPI equation. This calculation has not been validated in all clinical situations. eGFR's persistently <60 mL/min signify possible Chronic Kidney Disease.    Anion gap 16 (H) 5 - 15    Comment: Performed at [facility], [place] [address], [place], [place] [address]  I-stat troponin, ED  Status: None   Collection Time: 05/07/17  4:16 PM  Result Value Ref Range   Troponin i, poc 0.07 0.00 - 0.08 ng/mL   Comment 3            Comment: Due to the release kinetics of cTnI, a negative result within the first hours of the onset of symptoms does not rule out myocardial infarction with certainty. If myocardial infarction is still suspected, repeat the test at appropriate intervals.   I-Stat Chem 8, ED     Status: Abnormal   Collection Time: 05/07/17  4:18 PM  Result Value Ref Range   Sodium 135 135 - 145 mmol/L   Potassium 4.2 3.5 - 5.1 mmol/L   Chloride 97 (L) 101 - 111 mmol/L   BUN 44 (H) 6 - 20 mg/dL   Creatinine, Ser 1.90 (H) 0.44 - 1.00 mg/dL   Glucose, Bld 374 (H) 65 - 99 mg/dL   Calcium, Ion 1.09 (L) 1.15 - 1.40 mmol/L   TCO2 23 22 - 32 mmol/L   Hemoglobin 15.0 12.0 - 15.0 g/dL   HCT 44.0 36.0 - 46.0 %   Ct Head Wo Contrast  Result Date: 05/07/2017 CLINICAL DATA:  Left facial droop with left sided weakness and numbness and decreased vision in the left eye for 3 days. Last seen normal 05/02/2017. Transient aphasia. EXAM: CT HEAD WITHOUT CONTRAST TECHNIQUE: Contiguous axial images were obtained from the base of the skull through the vertex without intravenous contrast. COMPARISON:  MRI brain 11/07/2016.  CT 11/06/2016. FINDINGS: Brain: Interval development of extensive encephalomalacia in the posterior right parietal and occipital lobes consistent with a late subacute cortical infarct. Patchy low-density in the posterior right periventricular white matter corresponds with the areas of clustered restricted diffusion on previous MRI. No definite signs of acute stroke. There is no  evidence of acute intracranial hemorrhage, mass effect, midline shift or hydrocephalus. Vascular: Intracranial vascular calcifications. No hyperdense vessel identified. Skull: Negative for fracture or focal lesion. Sinuses/Orbits: The visualized paranasal sinuses and mastoid air cells are clear. No orbital abnormalities are seen. Other: None. IMPRESSION: 1. 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 Watershed infarcts are also present in the deep periventricular white matter. 2. No definite signs of acute stroke or hemorrhage. Electronically Signed   By: Richardean Sale M.D.   On: 05/07/2017 18:27   MRA brain:  1. Right proximal PCA occlusion with poor right PCA distribution collateralization. 2. Intracranial atherosclerosis with multiple segments of mild-to-moderate stenosis in the anterior and posterior circulation.  MRI brain: Early subacute infarction involving the right PCA distribution and right posterior hemisphere watershed. Minimal petechial hemorrhage in right occipital lobe. Minimal local mass effect.  Assessment: 77 y.o. female with 2-3 day history of left sided weakness 1. Exam reveals left visual field cut, right gaze preference and mild weakness of LUE and LLE.  2. 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 Watershed infarcts are also present in the deep periventricular white matter. 3. MRI/MRA brain reveals right proximal PCA occlusion with poor right PCA distribution collateralization, in the context of an early subacute infarction involving the right PCA distribution and right posterior hemisphere watershed. Minimal petechial hemorrhage in right occipital lobe  with minimal local mass effect.  4. Also noted on MRA is intracranial atherosclerosis with multiple segments of mild-to-moderate stenosis in the anterior and posterior circulation. 5. Stroke Risk Factors - DM, HLD, HTN and PVD  Plan: 1. HgbA1c, fasting lipid panel 2. Frequent neuro checks 3. PT consult, OT consult, Speech consult 4. Echocardiogram 5. Carotid dopplers 6. Prophylactic therapy- Continue ASA, Plavix and atorvastatin.  7. Risk factor modification 8. Telemetry monitoring 9. Out of permissive HTN time window  _0  signed: Dr. Kerney Elbe 05/07/2017, 11:21 PM

## 2017-05-08 ENCOUNTER — Observation Stay (HOSPITAL_BASED_OUTPATIENT_CLINIC_OR_DEPARTMENT_OTHER): Payer: Medicaid Other

## 2017-05-08 ENCOUNTER — Observation Stay (HOSPITAL_COMMUNITY): Payer: Medicaid Other

## 2017-05-08 ENCOUNTER — Encounter (HOSPITAL_COMMUNITY): Payer: Self-pay | Admitting: General Practice

## 2017-05-08 DIAGNOSIS — I639 Cerebral infarction, unspecified: Secondary | ICD-10-CM | POA: Diagnosis present

## 2017-05-08 DIAGNOSIS — Z794 Long term (current) use of insulin: Secondary | ICD-10-CM | POA: Diagnosis not present

## 2017-05-08 DIAGNOSIS — Z7982 Long term (current) use of aspirin: Secondary | ICD-10-CM | POA: Diagnosis not present

## 2017-05-08 DIAGNOSIS — Z96612 Presence of left artificial shoulder joint: Secondary | ICD-10-CM | POA: Diagnosis present

## 2017-05-08 DIAGNOSIS — N179 Acute kidney failure, unspecified: Secondary | ICD-10-CM

## 2017-05-08 DIAGNOSIS — E669 Obesity, unspecified: Secondary | ICD-10-CM | POA: Diagnosis present

## 2017-05-08 DIAGNOSIS — R29701 NIHSS score 1: Secondary | ICD-10-CM | POA: Diagnosis present

## 2017-05-08 DIAGNOSIS — E1122 Type 2 diabetes mellitus with diabetic chronic kidney disease: Secondary | ICD-10-CM | POA: Diagnosis present

## 2017-05-08 DIAGNOSIS — E785 Hyperlipidemia, unspecified: Secondary | ICD-10-CM | POA: Diagnosis present

## 2017-05-08 DIAGNOSIS — I63531 Cerebral infarction due to unspecified occlusion or stenosis of right posterior cerebral artery: Secondary | ICD-10-CM | POA: Diagnosis present

## 2017-05-08 DIAGNOSIS — R630 Anorexia: Secondary | ICD-10-CM | POA: Diagnosis present

## 2017-05-08 DIAGNOSIS — R29810 Facial weakness: Secondary | ICD-10-CM | POA: Diagnosis present

## 2017-05-08 DIAGNOSIS — Z6839 Body mass index (BMI) 39.0-39.9, adult: Secondary | ICD-10-CM | POA: Diagnosis not present

## 2017-05-08 DIAGNOSIS — I6340 Cerebral infarction due to embolism of unspecified cerebral artery: Secondary | ICD-10-CM

## 2017-05-08 DIAGNOSIS — R402142 Coma scale, eyes open, spontaneous, at arrival to emergency department: Secondary | ICD-10-CM | POA: Diagnosis present

## 2017-05-08 DIAGNOSIS — Z79899 Other long term (current) drug therapy: Secondary | ICD-10-CM | POA: Diagnosis not present

## 2017-05-08 DIAGNOSIS — E1151 Type 2 diabetes mellitus with diabetic peripheral angiopathy without gangrene: Secondary | ICD-10-CM | POA: Diagnosis present

## 2017-05-08 DIAGNOSIS — I5030 Unspecified diastolic (congestive) heart failure: Secondary | ICD-10-CM

## 2017-05-08 DIAGNOSIS — I129 Hypertensive chronic kidney disease with stage 1 through stage 4 chronic kidney disease, or unspecified chronic kidney disease: Secondary | ICD-10-CM | POA: Diagnosis present

## 2017-05-08 DIAGNOSIS — Z7901 Long term (current) use of anticoagulants: Secondary | ICD-10-CM | POA: Diagnosis not present

## 2017-05-08 DIAGNOSIS — R402252 Coma scale, best verbal response, oriented, at arrival to emergency department: Secondary | ICD-10-CM | POA: Diagnosis present

## 2017-05-08 DIAGNOSIS — Z8249 Family history of ischemic heart disease and other diseases of the circulatory system: Secondary | ICD-10-CM | POA: Diagnosis not present

## 2017-05-08 DIAGNOSIS — R402362 Coma scale, best motor response, obeys commands, at arrival to emergency department: Secondary | ICD-10-CM | POA: Diagnosis present

## 2017-05-08 LAB — LIPID PANEL
Cholesterol: 122 mg/dL (ref 0–200)
HDL: 28 mg/dL — AB (ref >40)
LDL CALC: 58 mg/dL (ref 0–99)
Total CHOL/HDL Ratio: 4.4 RATIO
Triglycerides: 179 mg/dL — ABNORMAL HIGH (ref <150)
VLDL: 36 mg/dL (ref 0–40)

## 2017-05-08 LAB — BASIC METABOLIC PANEL
Anion gap: 15 (ref 5–15)
BUN: 45 mg/dL — ABNORMAL HIGH (ref 6–20)
CALCIUM: 9 mg/dL (ref 8.9–10.3)
CO2: 22 mmol/L (ref 22–32)
CREATININE: 1.75 mg/dL — AB (ref 0.44–1.00)
Chloride: 100 mmol/L — ABNORMAL LOW (ref 101–111)
GFR calc non Af Amer: 28 mL/min — ABNORMAL LOW (ref >60)
GFR, EST AFRICAN AMERICAN: 32 mL/min — AB (ref >60)
Glucose, Bld: 229 mg/dL — ABNORMAL HIGH (ref 65–99)
Potassium: 4.2 mmol/L (ref 3.5–5.1)
Sodium: 137 mmol/L (ref 135–145)

## 2017-05-08 LAB — CBG MONITORING, ED
GLUCOSE-CAPILLARY: 276 mg/dL — AB (ref 65–99)
Glucose-Capillary: 277 mg/dL — ABNORMAL HIGH (ref 65–99)
Glucose-Capillary: 244 mg/dL — ABNORMAL HIGH (ref 65–99)
Glucose-Capillary: 209 mg/dL — ABNORMAL HIGH (ref 65–99)

## 2017-05-08 LAB — RAPID URINE DRUG SCREEN, HOSP PERFORMED
AMPHETAMINES: NOT DETECTED
BARBITURATES: NOT DETECTED
Benzodiazepines: NOT DETECTED
Cocaine: NOT DETECTED
Opiates: NOT DETECTED
Tetrahydrocannabinol: NOT DETECTED

## 2017-05-08 LAB — SODIUM, URINE, RANDOM: SODIUM UR: 64 mmol/L

## 2017-05-08 LAB — ECHOCARDIOGRAM COMPLETE: WEIGHTICAEL: 3664 [oz_av]

## 2017-05-08 LAB — GLUCOSE, CAPILLARY
GLUCOSE-CAPILLARY: 246 mg/dL — AB (ref 65–99)
Glucose-Capillary: 227 mg/dL — ABNORMAL HIGH (ref 65–99)

## 2017-05-08 LAB — CREATININE, URINE, RANDOM: Creatinine, Urine: 62.38 mg/dL

## 2017-05-08 MED ORDER — ONDANSETRON HCL 4 MG/2ML IJ SOLN
4.0000 mg | Freq: Four times a day (QID) | INTRAMUSCULAR | Status: DC | PRN
  Administered 2017-05-08: 4 mg via INTRAVENOUS
  Filled 2017-05-08: qty 2

## 2017-05-08 MED ORDER — SODIUM CHLORIDE 0.9 % IV SOLN: INTRAVENOUS | Status: DC

## 2017-05-08 MED ORDER — IOPAMIDOL (ISOVUE-370) INJECTION 76%
INTRAVENOUS | Status: AC
  Administered 2017-05-08: 50 mL
  Filled 2017-05-08: qty 50

## 2017-05-08 MED ORDER — INSULIN ASPART 100 UNIT/ML ~~LOC~~ SOLN
3.0000 [IU] | Freq: Three times a day (TID) | SUBCUTANEOUS | Status: DC
  Administered 2017-05-08 – 2017-05-09 (×3): 3 [IU] via SUBCUTANEOUS

## 2017-05-08 MED ORDER — PNEUMOCOCCAL VAC POLYVALENT 25 MCG/0.5ML IJ INJ: 0.5000 mL | INJECTION | INTRAMUSCULAR | Status: DC

## 2017-05-08 MED ORDER — AMLODIPINE BESYLATE 10 MG PO TABS
10.0000 mg | ORAL_TABLET | Freq: Every day | ORAL | Status: DC
  Administered 2017-05-08: 10 mg via ORAL
  Filled 2017-05-08: qty 1

## 2017-05-08 NOTE — ED Notes (Signed)
Pt c/o nausea, no PRNs noted for nausea. Dr. Antionette Charpyd paged via text

## 2017-05-08 NOTE — Progress Notes (Signed)
  Echocardiogram 2D Echocardiogram has been performed.  Meghan Ford 05/08/2017, 4:55 PM

## 2017-05-08 NOTE — ED Notes (Signed)
Hospitalist at bedside 

## 2017-05-08 NOTE — ED Notes (Signed)
Pt is sitting at bedside in recliner.

## 2017-05-08 NOTE — Anesthesia Preprocedure Evaluation (Addendum)
Anesthesia Evaluation  Patient identified by MRN, date of birth, ID band Patient awake    Reviewed: Allergy & Precautions, NPO status , Patient's Chart, lab work & pertinent test results  Airway Mallampati: II  TM Distance: >3 FB Neck ROM: Full    Dental   Pulmonary neg pulmonary ROS,    breath sounds clear to auscultation       Cardiovascular hypertension, Pt. on medications + Peripheral Vascular Disease   Rhythm:Regular Rate:Normal     Neuro/Psych  Headaches, CVA, Residual Symptoms    GI/Hepatic negative GI ROS, Neg liver ROS,   Endo/Other  diabetes, Type 2, Insulin DependentMorbid obesity  Renal/GU Renal disease     Musculoskeletal   Abdominal   Peds  Hematology negative hematology ROS (+)   Anesthesia Other Findings   Reproductive/Obstetrics                            Lab Results  Component Value Date   WBC 10.2 05/07/2017   HGB 15.0 05/07/2017   HCT 44.0 05/07/2017   MCV 80.3 05/07/2017   PLT 272 05/07/2017   Lab Results  Component Value Date   CREATININE 1.75 (H) 05/08/2017   BUN 45 (H) 05/08/2017   NA 137 05/08/2017   K 4.2 05/08/2017   CL 100 (L) 05/08/2017   CO2 22 05/08/2017    Anesthesia Physical Anesthesia Plan  ASA: III  Anesthesia Plan: MAC   Post-op Pain Management:    Induction: Intravenous  PONV Risk Score and Plan: 2 and Ondansetron, Propofol infusion and Treatment may vary due to age or medical condition  Airway Management Planned: Natural Airway and Nasal Cannula  Additional Equipment:   Intra-op Plan:   Post-operative Plan:   Informed Consent: I have reviewed the patients History and Physical, chart, labs and discussed the procedure including the risks, benefits and alternatives for the proposed anesthesia with the patient or authorized representative who has indicated his/her understanding and acceptance.     Plan Discussed with:  CRNA  Anesthesia Plan Comments:        Anesthesia Quick Evaluation

## 2017-05-08 NOTE — Evaluation (Signed)
Occupational Therapy Evaluation Patient Details Name: Meghan Ford MRN: [id number] DOB: 1940-12-21 Today's Date: 05/08/2017    History of Present Illness Pt is a 77 yo female admitted for early subacute infart of R PCA distribution and R posterior hemisphere watershed infarct.  Pt also with R occipital petechial hemmorage with little mass effect. Pt presented to ER with L side weakness/numbness, L facial droop, speech difficulty for 3 days.  Pt did have fall on 2/22 in which she "did not hurt herself" per family.  Pt also found to have mild to moderate stenosis in anterior/posterior circulation on MRA.   Clinical Impression   Pt admitted for above diagnosis and has the deficits listed below. Pt would benefit from further OT to increase independence with basic adl transfers and basic adls back to her baseline. Pt was receiving assist with most adls prior to admission so family may be willing to take her home.  Pt will definitely need 24/7 assist at home for all transfers and adls. Pt his Spanish speaking only.  Nurse tech in room was fluent and did all translating.  Will continue to follow.    Follow Up Recommendations  Home health OT;Supervision/Assistance - 24 hour    Equipment Recommendations  None recommended by OT    Recommendations for Other Services       Precautions / Restrictions Precautions Precautions: Fall Precaution Comments: Pt with previous falls Restrictions Weight Bearing Restrictions: No      Mobility Bed Mobility Overal bed mobility: Needs Assistance Bed Mobility: Rolling;Sidelying to Sit Rolling: Min assist Sidelying to sit: Mod assist;HOB elevated       General bed mobility comments: Pt wanting to hold to therapist but when cued to use bedrails did well.  Transfers Overall transfer level: Needs assistance Equipment used: 1 person hand held assist Transfers: Sit to/from [employer] Sit to Stand: Mod assist Stand pivot  transfers: Mod assist       General transfer comment: Pt with L neglect of environment and body as well as L weakness during transfer making pt a fall risk.    Balance Overall balance assessment: Needs assistance Sitting-balance support: Feet supported Sitting balance-Leahy Scale: Poor Sitting balance - Comments: Pt with L lean Postural control: Left lateral lean Standing balance support: Bilateral upper extremity supported;During functional activity Standing balance-Leahy Scale: Poor Standing balance comment: pt required outside support to remain in standing.                           ADL either performed or assessed with clinical judgement   ADL Overall ADL's : Needs assistance/impaired Eating/Feeding: Moderate assistance;Sitting Eating/Feeding Details (indicate cue type and reason): pt is left handed.  Difficulty using L hand.  Did better with L arm propped on pillow but pt appears to have L field cut vs L neglect.  Difficult to fully assess as pt was very lethergic and wanted to keep eyes closed. Grooming: Minimal assistance;Oral care;Wash/dry face;Sitting Grooming Details (indicate cue type and reason): Pt with coordination deficits in LUE.  Difficult to assess if pt falls asleep during grooming and drops items or if it is because of LUE weakness/coordination deficits. Upper Body Bathing: Moderate assistance;Sitting   Lower Body Bathing: Maximal assistance;Sit to/from stand   Upper Body Dressing : Moderate assistance;Sitting   Lower Body Dressing: Maximal assistance;Sit to/from stand   Toilet Transfer: Moderate assistance;BSC;Stand-pivot Toilet Transfer Details (indicate cue type and reason): Pivot with assist of therapist. Toileting-  Clothing Manipulation and Hygiene: Total assistance;Sit to/from stand Toileting - Clothing Manipulation Details (indicate cue type and reason): Pt wearing depends. States she "pees all the time." Pt wears depends at home as well but  always has someone with her when she walks to and uses the bathroom.     Functional mobility during ADLs: Moderate assistance General ADL Comments: Pt required much assist prior to this CVA for adls and continues to require mod to max assist with most adls.  New vision deficits are a concern for pt's safety     Vision Patient Visual Report: Blurring of vision;Peripheral vision impairment Vision Assessment?: Yes Eye Alignment: Impaired (comment) Ocular Range of Motion: Restricted on the left;Impaired-to be further tested in functional context Alignment/Gaze Preference: Gaze right Tracking/Visual Pursuits: Impaired - to be further tested in functional context Convergence: Impaired - to be further tested in functional context Visual Fields: Left visual field deficit;Impaired-to be further tested in functional context Additional Comments: Difficult to assess as pts wanted to keep eyes closed. Pt had R gaze preference.  Difficult to assess of she had a L visual field cut.     Perception     Praxis Praxis Praxis tested?: Not tested    Pertinent Vitals/Pain Pain Assessment: Faces Faces Pain Scale: Hurts little more Pain Location: bottoms of feet Pain Descriptors / Indicators: Tingling;Pins and needles Pain Intervention(s): Monitored during session;Repositioned     Hand Dominance Left   Extremity/Trunk Assessment Upper Extremity Assessment Upper Extremity Assessment: LUE deficits/detail LUE Deficits / Details: Shoulder 3+/5, biceps 4/5, triceps 4/5, grip 4/5.  LUE Sensation: decreased light touch;decreased proprioception LUE Coordination: decreased fine motor   Lower Extremity Assessment Lower Extremity Assessment: Defer to PT evaluation   Cervical / Trunk Assessment Cervical / Trunk Assessment: Normal   Communication Communication Communication: Prefers language other than English   Cognition Arousal/Alertness: Lethargic Behavior During Therapy: WFL for tasks  assessed/performed Overall Cognitive Status: No family/caregiver present to determine baseline cognitive functioning                                 General Comments: Difficult to assess cognition. Pt unaware of date but did know the year.  Pt very tired and cold.  Kept eyes closed during session unless asked to open them   General Comments  pt very lethargic but moves all 4 extremities.  Will need 24 hour assist at home.      Exercises     Shoulder Instructions      Home Living Family/patient expects to be discharged to:: Private residence Living Arrangements: Children Available Help at Discharge: Family;Available 24 hours/day Type of Home: House Home Access: Stairs to enter [employer] of Steps: 4 Entrance Stairs-Rails: Can reach both Home Layout: Able to live on main level with bedroom/bathroom     Bathroom Shower/Tub: Chief Strategy Officer: Standard Bathroom Accessibility: Yes How Accessible: Accessible via walker Home Equipment: Bedside commode;Cane - single point;Shower seat   Additional Comments: Difficulty to assess if pt has someone with her all the time but pt does have help with all activity at this point so assume 24/7 assist available.      Prior Functioning/Environment Level of Independence: Needs assistance  Gait / Transfers Assistance Needed: walks with cane.  Has someone wiith her most times. She does walk some in living room by herself. ADL's / Homemaking Assistance Needed: Pt has assist bathing, dressing, toileting, and  with all home skills. Communication / Swallowing Assistance Needed: Pt is spanish speaking only.  Translator stated pt speaks slowly but is understandable. Comments: Nurse tech was translating.        OT Problem List: Decreased strength;Decreased range of motion;Decreased activity tolerance;Impaired balance (sitting and/or standing);Impaired vision/perception;Decreased coordination;Decreased knowledge  of use of DME or AE;Decreased safety awareness;Decreased knowledge of precautions;Impaired sensation;Obesity;Impaired UE functional use;Pain;Decreased cognition      OT Treatment/Interventions: Self-care/ADL training;DME and/or AE instruction;Therapeutic activities;Visual/perceptual remediation/compensation    OT Goals(Current goals can be found in the care plan section) Acute Rehab OT Goals Patient Stated Goal: none stated OT Goal Formulation: With patient Time For Goal Achievement: 05/22/17 Potential to Achieve Goals: Fair ADL Goals Pt Will Perform Eating: with set-up;sitting Pt Will Perform Grooming: with set-up;sitting Pt Will Transfer to Toilet: with min assist;ambulating;bedside commode;regular height toilet Pt Will Perform Tub/Shower Transfer: with min assist;Tub transfer;ambulating;shower seat;rolling walker  OT Frequency: Min 3X/week   Barriers to D/C: Decreased caregiver support  unsure if pt has 24/7 assist but assume she does       Co-evaluation              AM-PAC PT "6 Clicks" Daily Activity     Outcome Measure Help from another person eating meals?: A Little Help from another person taking care of personal grooming?: A Little Help from another person toileting, which includes using toliet, bedpan, or urinal?: A Lot Help from another person bathing (including washing, rinsing, drying)?: A Lot Help from another person to put on and taking off regular upper body clothing?: A Lot Help from another person to put on and taking off regular lower body clothing?: A Lot 6 Click Score: 14   End of Session Nurse Communication: Mobility status  Activity Tolerance: Patient limited by lethargy Patient left: in chair;with call bell/phone within reach;with chair alarm set  OT Visit Diagnosis: Unsteadiness on feet (R26.81);Other abnormalities of gait and mobility (R26.89);Repeated falls (R29.6);Muscle weakness (generalized) (M62.81);History of falling (Z91.81);Low vision,  both eyes (H54.2);Feeding difficulties (R63.3);Other symptoms and signs involving the nervous system (R29.898);Hemiplegia and hemiparesis;Pain Hemiplegia - Right/Left: Left Hemiplegia - caused by: Cerebral infarction Pain - Right/Left: Left Pain - part of body: (bottom of feet)                Time: [id number] OT Time Calculation (min): 33 min Charges:  OT General Charges $OT Visit: 1 Visit OT Evaluation $OT Eval Moderate Complexity: 1 Mod OT Treatments $Self Care/Home Management : 8-22 mins G-Codes:     Tory EmeraldHolly Maeleigh Buschman, OTR/L [phone]  Hope BuddsJones, Tricia Pledger Anne 05/08/2017, 12:02 PM

## 2017-05-08 NOTE — ED Notes (Signed)
Spoke with Eunice BlaseDebbie in MRI and advised her to take patient to 5C-9. Patient's personal belongings were given to daughter (includes sweater, shirt, purse, keys, shoes) for safe keeping.

## 2017-05-08 NOTE — Progress Notes (Signed)
Pt brought up to unit room 3W17. Pt and family oriented to room and educated on room phone and call button. Vitals taken upon arrival.

## 2017-05-08 NOTE — H&P (View-Only) (Signed)
 NEUROHOSPITALISTS STROKE TEAM - DAILY PROGRESS NOTE   ADMISSION HISTORY:  Meghan Ford is an 76 y.o. female presenting with a 2-3 day history of new onset left sided weakness.   Dr. Pollina's note was reviewed: "Patient brought to the ER by family for evaluation of left-sided weakness. Symptoms began 2 or 3 days ago.  Family report that she fell 3 days ago but there was no injury.  The next day the family noticed that the left side of her face was drooping and her speech was slurred.  Patient reports that in addition to this, her tongue and left side of face are numb, left arm and leg are numb and the left arm is very weak.  She does not have a headache.  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 Watershed infarcts are also present in the deep periventricular white matter.  SUBJECTIVE (INTERVAL HISTORY)  Son is at the bedside. Patient is found laying in bed in NAD. Overall she feels her condition is unchanged. Voices no new complaints. No new events reported overnight.   OBJECTIVE Lab Results: CBC:  Recent Labs  Lab 05/07/17 1545 05/07/17 1618  WBC 10.2  --   HGB 13.4 15.0  HCT 41.9 44.0  MCV 80.3  --   PLT 272  --    BMP: Recent Labs  Lab 05/07/17 1545 05/07/17 1618 05/08/17 0503  NA 134* 135 137  K 4.2 4.2 4.2  CL 96* 97* 100*  CO2 22  --  22  GLUCOSE 376* 374* 229*  BUN 51* 44* 45*  CREATININE 2.12* 1.90* 1.75*  CALCIUM 8.9  --  9.0   Liver Function Tests:  Recent Labs  Lab 05/07/17 1545  AST 21  ALT 18  ALKPHOS 92  BILITOT 0.6  PROT 7.2  ALBUMIN 3.0*   Coagulation Studies:  Recent Labs    05/07/17 1545  APTT 31  INR 1.02   Urine Drug Screen:     Component Value Date/Time   LABOPIA  NONE DETECTED 11/07/2016 1445   COCAINSCRNUR NONE DETECTED 11/07/2016 1445   LABBENZ NONE DETECTED 11/07/2016 1445   AMPHETMU NONE DETECTED 11/07/2016 1445   THCU NONE DETECTED 11/07/2016 1445   LABBARB NONE DETECTED 11/07/2016 1445    Alcohol Level: No results for input(s): ETH in the last 168 hours.  PHYSICAL EXAM Temp:  [98.6 F (37 C)-98.8 F (37.1 C)] 98.6 F (37 C) (02/26 0149) Pulse Rate:  [74-81] 74 (02/26 1215) Resp:  [18-27] 19 (02/26 1215) BP: (126-171)/(57-79) 126/58 (02/26 1215) SpO2:  [92 %-99 %] 95 % (02/26 1215) General - Well nourished, well developed, in no apparent distress HEENT-  Normocephalic,  Cardiovascular - Regular rate and rhythm  Respiratory - Lungs clear bilaterally. No wheezing. Abdomen - soft and non-tender, BS normal Extremities- no edema or cyanosis Mental Status: Remains drowsy. Able to answer simple questions and follow basic motor commands in English as well as in Spanish. Spanish is her primary language.  Cranial Nerves: II:  Left visual field cut noted. PERRL.  III,IV, VI: Unable to cross more than 2 mm past midline with conjugate gaze to left. No nystagmus.  V,VII: No definite left facial droop. States temp sensation equal bilaterally VIII: hearing intact to voice IX,X: Mild hypophonia XI: Symmetric XII: midline tongue extension without   atrophy or fasciculations Motor: RUE 5/5 LUE subtle deficit rated at 4+/5  RLE: 5/5 LLE: 4/5 Sensory: Reacts to FT bilateral upper and lower extremities. Subjectively equal sensation to cold bilateral proximal lower and upper extremities Deep Tendon Reflexes:  2+ biceps, brachioradialis and patellae bilaterally 0 achilles bilaterally Plantars: Right: Equivocal  Left: Upgoing Cerebellar: Slow FNF on right without ataxia. Has difficulty performing on left.  Gait: Deferred due to falls risk concerns  IMAGING: I have personally reviewed the radiological images below and agree with the radiology  interpretations. Ct Angio Head W Or Wo Contrast  Result Date: 05/08/2017 CLINICAL DATA:  76-year-old female with right PCA and scattered posterior right MCA territory acute infarcts discovered following presentation of decreased left side vision and left side weakness for several days. Proximal right PCA occlusion on intracranial MRA. EXAM: CT ANGIOGRAPHY HEAD AND NECK TECHNIQUE: Multidetector CT imaging of the head and neck was performed using the standard protocol during bolus administration of intravenous contrast. Multiplanar CT image reconstructions and MIPs were obtained to evaluate the vascular anatomy. Carotid stenosis measurements (when applicable) are obtained utilizing NASCET criteria, using the distal internal carotid diameter as the denominator. CONTRAST:  50mL ISOVUE-370 IOPAMIDOL (ISOVUE-370) INJECTION 76% COMPARISON:  Brain MRI and intracranial MRA 0051 hours today. Head CT without contrast 05/07/2017. brain MRI and intracranial MRA 11/07/2016 FINDINGS: CTA NECK Skeleton: Absent dentition. No acute osseous abnormality identified. Upper chest: Negative lung apices. No superior mediastinal lymphadenopathy. Other neck: Negative aside from a partially retropharyngeal course of both carotid arteries (normal variant). No neck mass or lymphadenopathy. Aortic arch: 3 vessel arch configuration. Moderate calcified plaque in the arch but primarily distal to the great vessel origins. Right carotid system: Tortuous brachiocephalic artery with mild soft plaque. No associated stenosis. Tortuous proximal right CCA. No right CCA stenosis. Retropharyngeal right carotid bifurcation appears normal aside from mild tortuosity. No cervical right ICA stenosis. Left carotid system: No left CCA origin stenosis. Mildly tortuous left CCA. Minor soft plaque in the left CCA proximal to the bifurcation without stenosis. Widely patent left carotid bifurcation. Partially retropharyngeal right ECA. Tortuous but otherwise negative  cervical left ICA. Vertebral arteries: Tortuous right subclavian artery origin with a kinked appearance and mild calcified plaque but no significant stenosis. However, there is moderate to severe stenosis at the right vertebral artery origin related to calcified and soft plaque, best seen on series 9, image 82. The right vertebral otherwise is normal to the skull base. Tortuous proximal left subclavian artery with soft plaque and a kinked appearance but no significant stenosis. The left vertebral artery origin appears only mildly irregular without significant stenosis on series 8, image 148. Tortuous left V1 segment. The left vertebral artery is mildly non dominant but patent to the skull base without stenosis. CTA HEAD Posterior circulation: The distal right vertebral artery is mildly dominant and irregular throughout the V4 segment. No significant stenosis. The right PICA origin is patent. There is soft plaque or thrombus in the left vertebral artery V4 segment resulting in radiographic string sign stenosis or short segment occlusion of the vessel which is proximal to that which was visible on the intracranial MRA earlier today, but was partially demonstrated on the 2018 MRA. The appearance since that time has mildly progressed. See series 8, image 131. There is prominent calcified plaque at the distal aspect of this stenosis or occlusion. There is reconstituted flow in the distal left V4 segment, which appears to than functionally terminates in the left PICA origin which   remains patent. The basilar artery is patent but diminutive and irregular, and appears stable since 2018. The SCA origins are patent. There is a fetal type left PCA origin with only a diminutive left P1 segment identified, stable since 2018. The proximal right PCA is chronically abnormal since August 2018, and it is unclear whether there was originally fetal type right PCA origin. Chronically there is only is a faint thread-like right P1 segment.  Only diminutive right posterior communicating artery enhancement is identified (such as on series 9, image 97. There is mild to moderate reconstituted flow in the right PCA P2 and distal branches. Overall the appearance of the proximal right PCA and P com has not significantly changed since 11/07/2016. The contralateral left PCA branches are patent with mild to moderate P3 segment irregularity and stenosis. Anterior circulation: Both ICA siphons are patent. There is mild bilateral calcified siphon plaque without stenosis. The bilateral ophthalmic artery origins and the left posterior communicating artery origin are normal. Patent carotid termini. There is mild stenosis at the left ACA origin. The right A1 is normal. The anterior communicating artery and bilateral proximal ACA branches are normal. There is mild irregularity in the distal ACA branches. The left MCA origin and M1 segment are patent without stenosis. The left MCA bifurcation is patent. There is only mild left MCA M3 branch irregularity. The right MCA M1 origin is normal. The mid right M1 segment is mildly irregular and stenotic as seen on series 10, image 20, and this is stable since 2018. The distal right M1 has a normal caliber. A right MCA trifurcation is patent. There is no right MCA branch occlusion identified. The dominant posterior M2 branch demonstrates mild to moderate irregularity and stenosis as seen on series 12, image 16 which is stable since 2018. Venous sinuses: Patent on the delayed images. Anatomic variants: Mildly dominant right vertebral artery, and the distal left vertebral artery is diminutive beyond the left PICA. Fetal type left PCA origin. Questionable fetal type right PCA origin. Delayed phase: Right parietal and occipital lobe cytotoxic edema appears stable since yesterday. Small areas of cytotoxic edema in the posterior right frontal lobe also appears stable (series 13, image 20). Mild post ischemic enhancement in the more  confluent areas of infarct. No acute intracranial hemorrhage identified. No intracranial mass effect. Stable gray-white matter differentiation elsewhere. Review of the MIP images confirms the above findings IMPRESSION: 1. Severely diseased proximal Right PCA not significantly changed in appearance since an MRA in August 2018. It is unclear whether the vessel was originally a fetal type right PCA, but there is poor enhancement of diminutive Right Pcomm and Right P1 segments. There is mild to moderate reconstituted enhancement in the distal right PCA. 2. Superimposed chronic Right MCA M1 and dominant posterior Right M2 branch irregularity and stenosis, which appears atherosclerosis related and is also stable since the August 2018 MRA. 3. 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 4. 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 But there is reconstituted flow distally and the Left PICA origin remains patent. 5. Chronically diminutive and irregular Basilar Artery, stable since 2018. 6. No significant carotid artery stenosis, with mild mostly intra carotid atherosclerosis. 7. Stable CT appearance of the brain since yesterday. Electronically Signed   By: H  Hall M.D.   On: 05/08/2017 11:48   Ct Head Wo Contrast  Result   Date: 05/07/2017 CLINICAL DATA:  Left facial droop with left sided weakness and numbness and decreased vision in the left eye for 3 days. Last seen normal 05/02/2017. Transient aphasia. EXAM: CT HEAD WITHOUT CONTRAST TECHNIQUE: Contiguous axial images were obtained from the base of the skull through the vertex without intravenous contrast. COMPARISON:  MRI brain 11/07/2016.  CT 11/06/2016. FINDINGS: Brain: Interval development of extensive encephalomalacia in the posterior right parietal and occipital lobes consistent with a late  subacute cortical infarct. Patchy low-density in the posterior right periventricular white matter corresponds with the areas of clustered restricted diffusion on previous MRI. No definite signs of acute stroke. There is no evidence of acute intracranial hemorrhage, mass effect, midline shift or hydrocephalus. Vascular: Intracranial vascular calcifications. No hyperdense vessel identified. Skull: Negative for fracture or focal lesion. Sinuses/Orbits: The visualized paranasal sinuses and mastoid air cells are clear. No orbital abnormalities are seen. Other: None. IMPRESSION: 1. 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 Watershed infarcts are also present in the deep periventricular white matter. 2. No definite signs of acute stroke or hemorrhage. Electronically Signed   By: William  Veazey M.D.   On: 05/07/2017 18:27   Ct Angio Neck W Or Wo Contrast  Result Date: 05/08/2017 CLINICAL DATA:  76-year-old female with right PCA and scattered posterior right MCA territory acute infarcts discovered following presentation of decreased left side vision and left side weakness for several days. Proximal right PCA occlusion on intracranial MRA. EXAM: CT ANGIOGRAPHY HEAD AND NECK TECHNIQUE: Multidetector CT imaging of the head and neck was performed using the standard protocol during bolus administration of intravenous contrast. Multiplanar CT image reconstructions and MIPs were obtained to evaluate the vascular anatomy. Carotid stenosis measurements (when applicable) are obtained utilizing NASCET criteria, using the distal internal carotid diameter as the denominator. CONTRAST:  50mL ISOVUE-370 IOPAMIDOL (ISOVUE-370) INJECTION 76% COMPARISON:  Brain MRI and intracranial MRA 0051 hours today. Head CT without contrast 05/07/2017. brain MRI and intracranial MRA  11/07/2016 FINDINGS: CTA NECK Skeleton: Absent dentition. No acute osseous abnormality identified. Upper chest: Negative lung apices. No superior mediastinal lymphadenopathy. Other neck: Negative aside from a partially retropharyngeal course of both carotid arteries (normal variant). No neck mass or lymphadenopathy. Aortic arch: 3 vessel arch configuration. Moderate calcified plaque in the arch but primarily distal to the great vessel origins. Right carotid system: Tortuous brachiocephalic artery with mild soft plaque. No associated stenosis. Tortuous proximal right CCA. No right CCA stenosis. Retropharyngeal right carotid bifurcation appears normal aside from mild tortuosity. No cervical right ICA stenosis. Left carotid system: No left CCA origin stenosis. Mildly tortuous left CCA. Minor soft plaque in the left CCA proximal to the bifurcation without stenosis. Widely patent left carotid bifurcation. Partially retropharyngeal right ECA. Tortuous but otherwise negative cervical left ICA. Vertebral arteries: Tortuous right subclavian artery origin with a kinked appearance and mild calcified plaque but no significant stenosis. However, there is moderate to severe stenosis at the right vertebral artery origin related to calcified and soft plaque, best seen on series 9, image 82. The right vertebral otherwise is normal to the skull base. Tortuous proximal left subclavian artery with soft plaque and a kinked appearance but no significant stenosis. The left vertebral artery origin appears only mildly irregular without significant stenosis on series 8, image 148. Tortuous left V1 segment. The left vertebral artery is mildly non dominant but   patent to the skull base without stenosis. CTA HEAD Posterior circulation: The distal right vertebral artery is mildly dominant and irregular throughout the V4 segment. No significant stenosis. The right PICA origin is patent. There is soft plaque or thrombus in the left vertebral artery  V4 segment resulting in radiographic string sign stenosis or short segment occlusion of the vessel which is proximal to that which was visible on the intracranial MRA earlier today, but was partially demonstrated on the 2018 MRA. The appearance since that time has mildly progressed. See series 8, image 131. There is prominent calcified plaque at the distal aspect of this stenosis or occlusion. There is reconstituted flow in the distal left V4 segment, which appears to than functionally terminates in the left PICA origin which remains patent. The basilar artery is patent but diminutive and irregular, and appears stable since 2018. The SCA origins are patent. There is a fetal type left PCA origin with only a diminutive left P1 segment identified, stable since 2018. The proximal right PCA is chronically abnormal since August 2018, and it is unclear whether there was originally fetal type right PCA origin. Chronically there is only is a faint thread-like right P1 segment. Only diminutive right posterior communicating artery enhancement is identified (such as on series 9, image 97. There is mild to moderate reconstituted flow in the right PCA P2 and distal branches. Overall the appearance of the proximal right PCA and P com has not significantly changed since 11/07/2016. The contralateral left PCA branches are patent with mild to moderate P3 segment irregularity and stenosis. Anterior circulation: Both ICA siphons are patent. There is mild bilateral calcified siphon plaque without stenosis. The bilateral ophthalmic artery origins and the left posterior communicating artery origin are normal. Patent carotid termini. There is mild stenosis at the left ACA origin. The right A1 is normal. The anterior communicating artery and bilateral proximal ACA branches are normal. There is mild irregularity in the distal ACA branches. The left MCA origin and M1 segment are patent without stenosis. The left MCA bifurcation is patent.  There is only mild left MCA M3 branch irregularity. The right MCA M1 origin is normal. The mid right M1 segment is mildly irregular and stenotic as seen on series 10, image 20, and this is stable since 2018. The distal right M1 has a normal caliber. A right MCA trifurcation is patent. There is no right MCA branch occlusion identified. The dominant posterior M2 branch demonstrates mild to moderate irregularity and stenosis as seen on series 12, image 16 which is stable since 2018. Venous sinuses: Patent on the delayed images. Anatomic variants: Mildly dominant right vertebral artery, and the distal left vertebral artery is diminutive beyond the left PICA. Fetal type left PCA origin. Questionable fetal type right PCA origin. Delayed phase: Right parietal and occipital lobe cytotoxic edema appears stable since yesterday. Small areas of cytotoxic edema in the posterior right frontal lobe also appears stable (series 13, image 20). Mild post ischemic enhancement in the more confluent areas of infarct. No acute intracranial hemorrhage identified. No intracranial mass effect. Stable gray-white matter differentiation elsewhere. Review of the MIP images confirms the above findings IMPRESSION: 1. Severely diseased proximal Right PCA not significantly changed in appearance since an MRA in August 2018. It is unclear whether the vessel was originally a fetal type right PCA, but there is poor enhancement of diminutive Right Pcomm and Right P1 segments. There is mild to moderate reconstituted enhancement in the distal right PCA. 2. Superimposed   chronic Right MCA M1 and dominant posterior Right M2 branch irregularity and stenosis, which appears atherosclerosis related and is also stable since the August 2018 MRA. 3. 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 4. 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 But there is reconstituted flow distally and the Left PICA origin remains patent. 5. Chronically diminutive and irregular Basilar Artery, stable since 2018. 6. No significant carotid artery stenosis, with mild mostly intra carotid atherosclerosis. 7. Stable CT appearance of the brain since yesterday. Electronically Signed   By: H  Hall M.D.   On: 05/08/2017 11:48   Mr Brain Wo Contrast  Result Date: 05/08/2017 CLINICAL DATA:  76 y/o F; 3 days of left-sided weakness, numbness, left facial droop, and speech difficulty. EXAM: MRI HEAD WITHOUT CONTRAST MRA HEAD WITHOUT CONTRAST TECHNIQUE: Multiplanar, multiecho pulse sequences of the brain and surrounding structures were obtained without intravenous contrast. Angiographic images of the head were obtained using MRA technique without contrast. COMPARISON:  11/07/2016 MRI head and MRA head.  05/07/2016 CT head. FINDINGS: MRI HEAD FINDINGS Brain: Reduced diffusion within the right posterior parietal and occipital lobes with scattered small foci of reduced diffusion throughout the right superior parietal lobe and right posterior frontal lobe inclusive of precentral gyrus. Areas of infarction are associated with reduced diffusion and mild local mass effect compatible with early subacute infarction. Speckled foci of susceptibility hypointensity in right occipital lobe probably represent minimal petechial hemorrhage. Background of mild chronic microvascular ischemic changes and parenchymal volume loss of the brain. No hydrocephalus, extra-axial collection, effacement of basilar cisterns, or additional focus of mass effect. Vascular: As below. Skull and upper cervical spine: Normal marrow signal. Sinuses/Orbits: Mild ethmoid and frontal sinus antrum mucosal thickening. Otherwise negative. Other: None. MRA HEAD FINDINGS Internal carotid arteries:  Patent. Anterior cerebral arteries:  Patent. Middle cerebral arteries: Patent. Mild  right M1 stenosis and A1 stenosis. Multiple segments of mild stenosis and bilateral M2 distributions. Anterior communicating artery: Patent. Posterior communicating arteries:  Patent.  Fetal left PCA. Posterior cerebral arteries: Right proximal PCA occlusion with poor distal PCA collateralization. Patent left proximal PCA and downstream circulation. Basilar artery: Small caliber basilar artery likely due to left fetal and prominent right PCA circulation. Mild irregularity of the basilar artery with mild-to-moderate distal stenosis. Vertebral arteries: Patent.) Right proximal PICA moderate stenosis. No aneurysm identified. IMPRESSION: MRI head: Early subacute infarction involving the right PCA distribution and right posterior hemisphere watershed. Minimal petechial hemorrhage in right occipital lobe. Minimal local mass effect. MRA head: 1. Right proximal PCA occlusion with poor right PCA distribution collateralization. 2. Intracranial atherosclerosis with multiple segments of mild-to-moderate stenosis in the anterior and posterior circulation. These results will be called to the ordering clinician or representative by the Radiologist Assistant, and communication documented in the PACS or zVision Dashboard. Electronically Signed   By: Lance  Furusawa-Stratton M.D.   On: 05/08/2017 01:32   Mr Mra Head Wo Contrast  Result Date: 05/08/2017 CLINICAL DATA:  76 y/o F; 3 days of left-sided weakness, numbness, left facial droop, and speech difficulty. EXAM: MRI HEAD WITHOUT CONTRAST MRA HEAD WITHOUT CONTRAST TECHNIQUE: Multiplanar, multiecho pulse sequences of the brain and surrounding structures were obtained without intravenous contrast. Angiographic images of the head were obtained using MRA technique without contrast. COMPARISON:  11/07/2016 MRI head and MRA head.  05/07/2016 CT head. FINDINGS: MRI HEAD FINDINGS   Brain: Reduced diffusion within the right posterior parietal and occipital lobes with scattered small foci  of reduced diffusion throughout the right superior parietal lobe and right posterior frontal lobe inclusive of precentral gyrus. Areas of infarction are associated with reduced diffusion and mild local mass effect compatible with early subacute infarction. Speckled foci of susceptibility hypointensity in right occipital lobe probably represent minimal petechial hemorrhage. Background of mild chronic microvascular ischemic changes and parenchymal volume loss of the brain. No hydrocephalus, extra-axial collection, effacement of basilar cisterns, or additional focus of mass effect. Vascular: As below. Skull and upper cervical spine: Normal marrow signal. Sinuses/Orbits: Mild ethmoid and frontal sinus antrum mucosal thickening. Otherwise negative. Other: None. MRA HEAD FINDINGS Internal carotid arteries:  Patent. Anterior cerebral arteries:  Patent. Middle cerebral arteries: Patent. Mild right M1 stenosis and A1 stenosis. Multiple segments of mild stenosis and bilateral M2 distributions. Anterior communicating artery: Patent. Posterior communicating arteries:  Patent.  Fetal left PCA. Posterior cerebral arteries: Right proximal PCA occlusion with poor distal PCA collateralization. Patent left proximal PCA and downstream circulation. Basilar artery: Small caliber basilar artery likely due to left fetal and prominent right PCA circulation. Mild irregularity of the basilar artery with mild-to-moderate distal stenosis. Vertebral arteries: Patent.) Right proximal PICA moderate stenosis. No aneurysm identified. IMPRESSION: MRI head: Early subacute infarction involving the right PCA distribution and right posterior hemisphere watershed. Minimal petechial hemorrhage in right occipital lobe. Minimal local mass effect. MRA head: 1. Right proximal PCA occlusion with poor right PCA distribution collateralization. 2. Intracranial atherosclerosis with multiple segments of mild-to-moderate stenosis in the anterior and posterior  circulation. These results will be called to the ordering clinician or representative by the Radiologist Assistant, and communication documented in the PACS or zVision Dashboard. Electronically Signed   By: Lance  Furusawa-Stratton M.D.   On: 05/08/2017 01:32   Echocardiogram:                                              PENDING  B/L Carotid Duplex        11/07/2016 Findings suggest 1-39% internal carotid artery stenosis bilaterally. Vertebral arteries are patent with antegrade flow.    IMPRESSION: Meghan Ford is a 76 y.o. female with PMH of HTN, HLD, DM, and PVD with 2-3 day history of left sided weakness. MRI reveals: 1. Exam reveals left visual field cut, right gaze preference and mild weakness of LUE and LLE.   Early/Subacute infarct Right PCA Infarct and Right posterior hemisphere watershed:  Suspected Etiology: small vessel disease Resultant Symptoms:  left visual field cut, right gaze preference and mild weakness of LUE and LLE.  Stroke Risk Factors: diabetes mellitus, hyperlipidemia and hypertension Other Stroke Risk Factors: Advanced age, Obesity, Body mass index is 39.31 kg/m. , CKD  Outstanding Stroke Work-up Studies:     Echocardiogram:                                                    PENDING  PLAN  05/08/2017: Continue Aspirin/ Plavix/ Statin Frequent neuro checks Telemetry monitoring PT/OT/SLP Consult PM & Rehab Consult Case Management /MSW  TEE and Loop Recorder Placement in AM Ongoing aggressive stroke risk factor management Patient counseled to be compliant   with her antithrombotic medications Patient counseled on Lifestyle modifications including, Diet, Exercise, and Stress Follow up with [facility] in 6 weeks  R/O AFIB: TEE and Loop Recorder Placement  HYPERTENSION: Stable, some elevated B/P's overnight Permissive hypertension (OK if <220/120) for 24-48 hours post stroke and then gradually normalized within 5-7 days. Long  term BP goal normotensive. May slowly restart home B/P medications after 48 hours Home Meds: Norvasc, Hyzaar  HYPERLIPIDEMIA:    Component Value Date/Time   CHOL 122 05/08/2017 0503   TRIG 179 (H) 05/08/2017 0503   HDL 28 (L) 05/08/2017 0503   CHOLHDL 4.4 05/08/2017 0503   VLDL 36 05/08/2017 0503   LDLCALC 58 05/08/2017 0503  Home Meds:  Lipitor 80 mg LDL  goal < 70 Continued on Lipitor to 80 mg daily Continue statin at discharge  DIABETES: Lab Results  Component Value Date   HGBA1C 13.0 (H) 11/08/2016  HgbA1c goal < 7.0 Currently on: Novolog Continue CBG monitoring and SSI to maintain glucose 140-180 mg/dl DM education   OBESITY Obesity, Body mass index is 39.31 kg/m. Greater than/equal to 30  Other Active Problems: Principal Problem:   Stroke (HCC) Active Problems:   Essential hypertension   Insulin dependent type 2 diabetes mellitus, uncontrolled (HCC)   History of CVA (cerebrovascular accident)    Hospital day # 0 VTE prophylaxis: Heparin Diet : Fall precautions Diet heart healthy/carb modified Room service appropriate? Yes; Fluid consistency: Thin Diet NPO time specified   FAMILY UPDATES: family at bedside  TEAM UPDATES: Ezenduka, Nkeiruka J, MD    Prior Home Stroke Medications:  aspirin 325 mg daily and clopidogrel 75 mg daily  Discharge Stroke Meds:  Please discharge patient on aspirin 325 mg daily and clopidogrel 75 mg daily   Disposition: 06-Home-Health Care Svc Therapy Recs:               PENDING Follow Up:  Follow-up Information    Martin, Nancy Carolyn, NP. Schedule an appointment as soon as possible for a visit in 6 week(s).   Specialty:  Family Medicine Contact information: [address] [place] [address] [phone]          Newlin, Enobong, MD -PCP Follow up in 1-2 weeks      Assessment & plan discussed with with attending physician and they are in agreement.    Akyah Lagrange A Ruhani Umland, ANP-C Stroke Neurology  Team 05/08/2017 4:02 PM   05/08/2017 ATTENDING ASSESSMENT:   I reviewed above note and agree with the assessment and plan. I have made any additions or clarifications directly to the above note. Pt was seen and examined.   76-year-old female with history of diabetes, hypertension, hyperlipidemia, peripheral vascular disease, on aspirin Plavix and statin at home admitted for left-sided weakness for 2-3 days, left facial droop, slurred speech and suffered a fall.  CT head showed right PCA subacute infarcts.  MRI showed right inferior MCA, MCA/PCA, MCA/ACA infarct as well as right CR infarcts.  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  CTA head neck showed stable right PCA severe stenosis, right M1 and M2 stenosis from 10/2016 and progressed right VA origin and severe left V4 stenosis from 10/2016.  No carotid stenosis.  EF 65-70%.  LDL 58 and A1c pending.  UDS pending.  Patient stroke embolic-looking, concerning for cardioembolic source.  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  Recommend TEE/loop recorder for   cardio embolic workup.  Continue aspirin Plavix and statin.  Jindong Xu, MD PhD Stroke Neurology 05/08/2017 5:55 PM    To contact Stroke Continuity provider, please refer to [email or web address]. After hours, contact General Neurology    

## 2017-05-08 NOTE — ED Notes (Signed)
Text page sent to MD Opyd regarding diet

## 2017-05-08 NOTE — Progress Notes (Addendum)
NEUROHOSPITALISTS STROKE TEAM - DAILY PROGRESS NOTE   ADMISSION HISTORY:  Meghan Ford is an 77 y.o. female presenting with a 2-3 day history of new onset left sided weakness.   Dr. Bing Plume note was reviewed: "Patient brought to the ER by family for evaluation of left-sided weakness. Symptoms began 2 or 3 days ago.  Family report that she fell 3 days ago but there was no injury.  The next day the family noticed that the left side of her face was drooping and her speech was slurred.  Patient reports that in addition to this, her tongue and left side of face are numb, left arm and leg are numb and the left arm is very weak.  She does not have a headache.  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 Watershed infarcts are also present in the deep periventricular white matter.  SUBJECTIVE (INTERVAL HISTORY)  Son is at the bedside. Patient is found laying in bed in NAD. Overall she feels her condition is unchanged. Voices no new complaints. No new events reported overnight.   OBJECTIVE Lab Results: CBC:  Recent Labs  Lab 05/07/17 1545 05/07/17 1618  WBC 10.2  --   HGB 13.4 15.0  HCT 41.9 44.0  MCV 80.3  --   PLT 272  --    BMP: Recent Labs  Lab 05/07/17 1545 05/07/17 1618 05/08/17 0503  NA 134* 135 137  K 4.2 4.2 4.2  CL 96* 97* 100*  CO2 22  --  22  GLUCOSE 376* 374* 229*  BUN 51* 44* 45*  CREATININE 2.12* 1.90* 1.75*  CALCIUM 8.9  --  9.0   Liver Function Tests:  Recent Labs  Lab 05/07/17 1545  AST 21  ALT 18  ALKPHOS 92  BILITOT 0.6  PROT 7.2  ALBUMIN 3.0*   Coagulation Studies:  Recent Labs    05/07/17 1545  APTT 31  INR 1.02   Urine Drug Screen:     Component Value Date/Time   LABOPIA  NONE DETECTED 11/07/2016 1445   COCAINSCRNUR NONE DETECTED 11/07/2016 1445   LABBENZ NONE DETECTED 11/07/2016 1445   AMPHETMU NONE DETECTED 11/07/2016 1445   THCU NONE DETECTED 11/07/2016 1445   LABBARB NONE DETECTED 11/07/2016 1445    Alcohol Level: No results for input(s): ETH in the last 168 hours.  PHYSICAL EXAM Temp:  [98.6 F (37 C)-98.8 F (37.1 C)] 98.6 F (37 C) (02/26 0149) Pulse Rate:  [74-81] 74 (02/26 1215) Resp:  [18-27] 19 (02/26 1215) BP: (126-171)/(57-79) 126/58 (02/26 1215) SpO2:  [92 %-99 %] 95 % (02/26 1215) General - Well nourished, well developed, in no apparent distress HEENT-  Normocephalic,  Cardiovascular - Regular rate and rhythm  Respiratory - Lungs clear bilaterally. No wheezing. Abdomen - soft and non-tender, BS normal Extremities- no edema or cyanosis Mental Status: Remains drowsy. Able to answer simple questions and follow basic motor commands in English as well as in [place]. Spanish is her primary language.  Cranial Nerves: II:  Left visual field cut noted. PERRL.  III,IV, VI: Unable to cross more than 2 mm past midline with conjugate gaze to left. No nystagmus.  V,VII: No definite left facial droop. States temp sensation equal bilaterally VIII: hearing intact to voice IX,X: Mild hypophonia XI: Symmetric XII: midline tongue extension without  atrophy or fasciculations Motor: RUE 5/5 LUE subtle deficit rated at 4+/5  RLE: 5/5 LLE: 4/5 Sensory: Reacts to FT bilateral upper and lower extremities. Subjectively equal sensation to cold bilateral proximal lower and upper extremities Deep Tendon Reflexes:  2+ biceps, brachioradialis and patellae bilaterally 0 achilles bilaterally Plantars: Right: Equivocal  Left: Upgoing Cerebellar: Slow FNF on right without ataxia. Has difficulty performing on left.  Gait: Deferred due to falls risk concerns  IMAGING: I have personally reviewed the radiological images below and agree with the radiology  interpretations. Ct Angio Head W Or Wo Contrast  Result Date: 05/08/2017 CLINICAL DATA:  77 year old female with right PCA and scattered posterior right MCA territory acute infarcts discovered following presentation of decreased left side vision and left side weakness for several days. Proximal right PCA occlusion on intracranial MRA. EXAM: CT ANGIOGRAPHY HEAD AND NECK TECHNIQUE: Multidetector CT imaging of the head and neck was performed using the standard protocol during bolus administration of intravenous contrast. Multiplanar CT image reconstructions and MIPs were obtained to evaluate the vascular anatomy. Carotid stenosis measurements (when applicable) are obtained utilizing NASCET criteria, using the distal internal carotid diameter as the denominator. CONTRAST:  50mL ISOVUE-370 IOPAMIDOL (ISOVUE-370) INJECTION 76% COMPARISON:  Brain MRI and intracranial MRA 0051 hours today. Head CT without contrast 05/07/2017. brain MRI and intracranial MRA 11/07/2016 FINDINGS: CTA NECK Skeleton: Absent dentition. No acute osseous abnormality identified. Upper chest: Negative lung apices. No superior mediastinal lymphadenopathy. Other neck: Negative aside from a partially retropharyngeal course of both carotid arteries (normal variant). No neck mass or lymphadenopathy. Aortic arch: 3 vessel arch configuration. Moderate calcified plaque in the arch but primarily distal to the great vessel origins. Right carotid system: Tortuous brachiocephalic artery with mild soft plaque. No associated stenosis. Tortuous proximal right CCA. No right CCA stenosis. Retropharyngeal right carotid bifurcation appears normal aside from mild tortuosity. No cervical right ICA stenosis. Left carotid system: No left CCA origin stenosis. Mildly tortuous left CCA. Minor soft plaque in the left CCA proximal to the bifurcation without stenosis. Widely patent left carotid bifurcation. Partially retropharyngeal right ECA. Tortuous but otherwise negative  cervical left ICA. Vertebral arteries: Tortuous right subclavian artery origin with a kinked appearance and mild calcified plaque but no significant stenosis. However, there is moderate to severe stenosis at the right vertebral artery origin related to calcified and soft plaque, best seen on series 9, image 82. The right vertebral otherwise is normal to the skull base. Tortuous proximal left subclavian artery with soft plaque and a kinked appearance but no significant stenosis. The left vertebral artery origin appears only mildly irregular without significant stenosis on series 8, image 148. Tortuous left V1 segment. The left vertebral artery is mildly non dominant but patent to the skull base without stenosis. CTA HEAD Posterior circulation: The distal right vertebral artery is mildly dominant and irregular throughout the V4 segment. No significant stenosis. The right PICA origin is patent. There is soft plaque or thrombus in the left vertebral artery V4 segment resulting in radiographic string sign stenosis or short segment occlusion of the vessel which is proximal to that which was visible on the intracranial MRA earlier today, but was partially demonstrated on the 2018 MRA. The appearance since that time has mildly progressed. See series 8, image 131. There is prominent calcified plaque at the distal aspect of this stenosis or occlusion. There is reconstituted flow in the distal left V4 segment, which appears to than functionally terminates in the left PICA origin which  remains patent. The basilar artery is patent but diminutive and irregular, and appears stable since 2018. The SCA origins are patent. There is a fetal type left PCA origin with only a diminutive left P1 segment identified, stable since 2018. The proximal right PCA is chronically abnormal since August 2018, and it is unclear whether there was originally fetal type right PCA origin. Chronically there is only is a faint thread-like right P1 segment.  Only diminutive right posterior communicating artery enhancement is identified (such as on series 9, image 97. There is mild to moderate reconstituted flow in the right PCA P2 and distal branches. Overall the appearance of the proximal right PCA and P com has not significantly changed since 11/07/2016. The contralateral left PCA branches are patent with mild to moderate P3 segment irregularity and stenosis. Anterior circulation: Both ICA siphons are patent. There is mild bilateral calcified siphon plaque without stenosis. The bilateral ophthalmic artery origins and the left posterior communicating artery origin are normal. Patent carotid termini. There is mild stenosis at the left ACA origin. The right A1 is normal. The anterior communicating artery and bilateral proximal ACA branches are normal. There is mild irregularity in the distal ACA branches. The left MCA origin and M1 segment are patent without stenosis. The left MCA bifurcation is patent. There is only mild left MCA M3 branch irregularity. The right MCA M1 origin is normal. The mid right M1 segment is mildly irregular and stenotic as seen on series 10, image 20, and this is stable since 2018. The distal right M1 has a normal caliber. A right MCA trifurcation is patent. There is no right MCA branch occlusion identified. The dominant posterior M2 branch demonstrates mild to moderate irregularity and stenosis as seen on series 12, image 16 which is stable since 2018. Venous sinuses: Patent on the delayed images. Anatomic variants: Mildly dominant right vertebral artery, and the distal left vertebral artery is diminutive beyond the left PICA. Fetal type left PCA origin. Questionable fetal type right PCA origin. Delayed phase: Right parietal and occipital lobe cytotoxic edema appears stable since yesterday. Small areas of cytotoxic edema in the posterior right frontal lobe also appears stable (series 13, image 20). Mild post ischemic enhancement in the more  confluent areas of infarct. No acute intracranial hemorrhage identified. No intracranial mass effect. Stable gray-white matter differentiation elsewhere. Review of the MIP images confirms the above findings IMPRESSION: 1. Severely diseased proximal Right PCA not significantly changed in appearance since an MRA in August 2018. It is unclear whether the vessel was originally a fetal type right PCA, but there is poor enhancement of diminutive Right Pcomm and Right P1 segments. There is mild to moderate reconstituted enhancement in the distal right PCA. 2. Superimposed chronic Right MCA M1 and dominant posterior Right M2 branch irregularity and stenosis, which appears atherosclerosis related and is also stable since the August 2018 MRA. 3. 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 4. 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 But there is reconstituted flow distally and the Left PICA origin remains patent. 5. Chronically diminutive and irregular Basilar Artery, stable since 2018. 6. No significant carotid artery stenosis, with mild mostly intra carotid atherosclerosis. 7. Stable CT appearance of the brain since yesterday. Electronically Signed   By: Odessa FlemingH  Hall M.D.   On: 05/08/2017 11:48   Ct Head Wo Contrast  Result  Date: 05/07/2017 CLINICAL DATA:  Left facial droop with left sided weakness and numbness and decreased vision in the left eye for 3 days. Last seen normal 05/02/2017. Transient aphasia. EXAM: CT HEAD WITHOUT CONTRAST TECHNIQUE: Contiguous axial images were obtained from the base of the skull through the vertex without intravenous contrast. COMPARISON:  MRI brain 11/07/2016.  CT 11/06/2016. FINDINGS: Brain: Interval development of extensive encephalomalacia in the posterior right parietal and occipital lobes consistent with a late  subacute cortical infarct. Patchy low-density in the posterior right periventricular white matter corresponds with the areas of clustered restricted diffusion on previous MRI. No definite signs of acute stroke. There is no evidence of acute intracranial hemorrhage, mass effect, midline shift or hydrocephalus. Vascular: Intracranial vascular calcifications. No hyperdense vessel identified. Skull: Negative for fracture or focal lesion. Sinuses/Orbits: The visualized paranasal sinuses and mastoid air cells are clear. No orbital abnormalities are seen. Other: None. IMPRESSION: 1. 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 Watershed infarcts are also present in the deep periventricular white matter. 2. No definite signs of acute stroke or hemorrhage. Electronically Signed   By: Carey Bullocks M.D.   On: 05/07/2017 18:27   Ct Angio Neck W Or Wo Contrast  Result Date: 05/08/2017 CLINICAL DATA:  77 year old female with right PCA and scattered posterior right MCA territory acute infarcts discovered following presentation of decreased left side vision and left side weakness for several days. Proximal right PCA occlusion on intracranial MRA. EXAM: CT ANGIOGRAPHY HEAD AND NECK TECHNIQUE: Multidetector CT imaging of the head and neck was performed using the standard protocol during bolus administration of intravenous contrast. Multiplanar CT image reconstructions and MIPs were obtained to evaluate the vascular anatomy. Carotid stenosis measurements (when applicable) are obtained utilizing NASCET criteria, using the distal internal carotid diameter as the denominator. CONTRAST:  50mL ISOVUE-370 IOPAMIDOL (ISOVUE-370) INJECTION 76% COMPARISON:  Brain MRI and intracranial MRA 0051 hours today. Head CT without contrast 05/07/2017. brain MRI and intracranial MRA  11/07/2016 FINDINGS: CTA NECK Skeleton: Absent dentition. No acute osseous abnormality identified. Upper chest: Negative lung apices. No superior mediastinal lymphadenopathy. Other neck: Negative aside from a partially retropharyngeal course of both carotid arteries (normal variant). No neck mass or lymphadenopathy. Aortic arch: 3 vessel arch configuration. Moderate calcified plaque in the arch but primarily distal to the great vessel origins. Right carotid system: Tortuous brachiocephalic artery with mild soft plaque. No associated stenosis. Tortuous proximal right CCA. No right CCA stenosis. Retropharyngeal right carotid bifurcation appears normal aside from mild tortuosity. No cervical right ICA stenosis. Left carotid system: No left CCA origin stenosis. Mildly tortuous left CCA. Minor soft plaque in the left CCA proximal to the bifurcation without stenosis. Widely patent left carotid bifurcation. Partially retropharyngeal right ECA. Tortuous but otherwise negative cervical left ICA. Vertebral arteries: Tortuous right subclavian artery origin with a kinked appearance and mild calcified plaque but no significant stenosis. However, there is moderate to severe stenosis at the right vertebral artery origin related to calcified and soft plaque, best seen on series 9, image 82. The right vertebral otherwise is normal to the skull base. Tortuous proximal left subclavian artery with soft plaque and a kinked appearance but no significant stenosis. The left vertebral artery origin appears only mildly irregular without significant stenosis on series 8, image 148. Tortuous left V1 segment. The left vertebral artery is mildly non dominant but  patent to the skull base without stenosis. CTA HEAD Posterior circulation: The distal right vertebral artery is mildly dominant and irregular throughout the V4 segment. No significant stenosis. The right PICA origin is patent. There is soft plaque or thrombus in the left vertebral artery  V4 segment resulting in radiographic string sign stenosis or short segment occlusion of the vessel which is proximal to that which was visible on the intracranial MRA earlier today, but was partially demonstrated on the 2018 MRA. The appearance since that time has mildly progressed. See series 8, image 131. There is prominent calcified plaque at the distal aspect of this stenosis or occlusion. There is reconstituted flow in the distal left V4 segment, which appears to than functionally terminates in the left PICA origin which remains patent. The basilar artery is patent but diminutive and irregular, and appears stable since 2018. The SCA origins are patent. There is a fetal type left PCA origin with only a diminutive left P1 segment identified, stable since 2018. The proximal right PCA is chronically abnormal since August 2018, and it is unclear whether there was originally fetal type right PCA origin. Chronically there is only is a faint thread-like right P1 segment. Only diminutive right posterior communicating artery enhancement is identified (such as on series 9, image 97. There is mild to moderate reconstituted flow in the right PCA P2 and distal branches. Overall the appearance of the proximal right PCA and P com has not significantly changed since 11/07/2016. The contralateral left PCA branches are patent with mild to moderate P3 segment irregularity and stenosis. Anterior circulation: Both ICA siphons are patent. There is mild bilateral calcified siphon plaque without stenosis. The bilateral ophthalmic artery origins and the left posterior communicating artery origin are normal. Patent carotid termini. There is mild stenosis at the left ACA origin. The right A1 is normal. The anterior communicating artery and bilateral proximal ACA branches are normal. There is mild irregularity in the distal ACA branches. The left MCA origin and M1 segment are patent without stenosis. The left MCA bifurcation is patent.  There is only mild left MCA M3 branch irregularity. The right MCA M1 origin is normal. The mid right M1 segment is mildly irregular and stenotic as seen on series 10, image 20, and this is stable since 2018. The distal right M1 has a normal caliber. A right MCA trifurcation is patent. There is no right MCA branch occlusion identified. The dominant posterior M2 branch demonstrates mild to moderate irregularity and stenosis as seen on series 12, image 16 which is stable since 2018. Venous sinuses: Patent on the delayed images. Anatomic variants: Mildly dominant right vertebral artery, and the distal left vertebral artery is diminutive beyond the left PICA. Fetal type left PCA origin. Questionable fetal type right PCA origin. Delayed phase: Right parietal and occipital lobe cytotoxic edema appears stable since yesterday. Small areas of cytotoxic edema in the posterior right frontal lobe also appears stable (series 13, image 20). Mild post ischemic enhancement in the more confluent areas of infarct. No acute intracranial hemorrhage identified. No intracranial mass effect. Stable gray-white matter differentiation elsewhere. Review of the MIP images confirms the above findings IMPRESSION: 1. Severely diseased proximal Right PCA not significantly changed in appearance since an MRA in August 2018. It is unclear whether the vessel was originally a fetal type right PCA, but there is poor enhancement of diminutive Right Pcomm and Right P1 segments. There is mild to moderate reconstituted enhancement in the distal right PCA. 2. Superimposed  chronic Right MCA M1 and dominant posterior Right M2 branch irregularity and stenosis, which appears atherosclerosis related and is also stable since the August 2018 MRA. 3. 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 4. 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 But there is reconstituted flow distally and the Left PICA origin remains patent. 5. Chronically diminutive and irregular Basilar Artery, stable since 2018. 6. No significant carotid artery stenosis, with mild mostly intra carotid atherosclerosis. 7. Stable CT appearance of the brain since yesterday. Electronically Signed   By: Odessa Fleming M.D.   On: 05/08/2017 11:48   Mr Brain Wo Contrast  Result Date: 05/08/2017 CLINICAL DATA:  77 y/o F; 3 days of left-sided weakness, numbness, left facial droop, and speech difficulty. EXAM: MRI HEAD WITHOUT CONTRAST MRA HEAD WITHOUT CONTRAST TECHNIQUE: Multiplanar, multiecho pulse sequences of the brain and surrounding structures were obtained without intravenous contrast. Angiographic images of the head were obtained using MRA technique without contrast. COMPARISON:  11/07/2016 MRI head and MRA head.  05/07/2016 CT head. FINDINGS: MRI HEAD FINDINGS Brain: Reduced diffusion within the right posterior parietal and occipital lobes with scattered small foci of reduced diffusion throughout the right superior parietal lobe and right posterior frontal lobe inclusive of precentral gyrus. Areas of infarction are associated with reduced diffusion and mild local mass effect compatible with early subacute infarction. Speckled foci of susceptibility hypointensity in right occipital lobe probably represent minimal petechial hemorrhage. Background of mild chronic microvascular ischemic changes and parenchymal volume loss of the brain. No hydrocephalus, extra-axial collection, effacement of basilar cisterns, or additional focus of mass effect. Vascular: As below. Skull and upper cervical spine: Normal marrow signal. Sinuses/Orbits: Mild ethmoid and frontal sinus antrum mucosal thickening. Otherwise negative. Other: None. MRA HEAD FINDINGS Internal carotid arteries:  Patent. Anterior cerebral arteries:  Patent. Middle cerebral arteries: Patent. Mild  right M1 stenosis and A1 stenosis. Multiple segments of mild stenosis and bilateral M2 distributions. Anterior communicating artery: Patent. Posterior communicating arteries:  Patent.  Fetal left PCA. Posterior cerebral arteries: Right proximal PCA occlusion with poor distal PCA collateralization. Patent left proximal PCA and downstream circulation. Basilar artery: Small caliber basilar artery likely due to left fetal and prominent right PCA circulation. Mild irregularity of the basilar artery with mild-to-moderate distal stenosis. Vertebral arteries: Patent.) Right proximal PICA moderate stenosis. No aneurysm identified. IMPRESSION: MRI head: Early subacute infarction involving the right PCA distribution and right posterior hemisphere watershed. Minimal petechial hemorrhage in right occipital lobe. Minimal local mass effect. MRA head: 1. Right proximal PCA occlusion with poor right PCA distribution collateralization. 2. Intracranial atherosclerosis with multiple segments of mild-to-moderate stenosis in the anterior and posterior circulation. These results will be called to the ordering clinician or representative by the Radiologist Assistant, and communication documented in the PACS or zVision Dashboard. Electronically Signed   By: Mitzi Hansen M.D.   On: 05/08/2017 01:32   Mr Maxine Glenn Head Wo Contrast  Result Date: 05/08/2017 CLINICAL DATA:  77 y/o F; 3 days of left-sided weakness, numbness, left facial droop, and speech difficulty. EXAM: MRI HEAD WITHOUT CONTRAST MRA HEAD WITHOUT CONTRAST TECHNIQUE: Multiplanar, multiecho pulse sequences of the brain and surrounding structures were obtained without intravenous contrast. Angiographic images of the head were obtained using MRA technique without contrast. COMPARISON:  11/07/2016 MRI head and MRA head.  05/07/2016 CT head. FINDINGS: MRI HEAD FINDINGS  Brain: Reduced diffusion within the right posterior parietal and occipital lobes with scattered small foci  of reduced diffusion throughout the right superior parietal lobe and right posterior frontal lobe inclusive of precentral gyrus. Areas of infarction are associated with reduced diffusion and mild local mass effect compatible with early subacute infarction. Speckled foci of susceptibility hypointensity in right occipital lobe probably represent minimal petechial hemorrhage. Background of mild chronic microvascular ischemic changes and parenchymal volume loss of the brain. No hydrocephalus, extra-axial collection, effacement of basilar cisterns, or additional focus of mass effect. Vascular: As below. Skull and upper cervical spine: Normal marrow signal. Sinuses/Orbits: Mild ethmoid and frontal sinus antrum mucosal thickening. Otherwise negative. Other: None. MRA HEAD FINDINGS Internal carotid arteries:  Patent. Anterior cerebral arteries:  Patent. Middle cerebral arteries: Patent. Mild right M1 stenosis and A1 stenosis. Multiple segments of mild stenosis and bilateral M2 distributions. Anterior communicating artery: Patent. Posterior communicating arteries:  Patent.  Fetal left PCA. Posterior cerebral arteries: Right proximal PCA occlusion with poor distal PCA collateralization. Patent left proximal PCA and downstream circulation. Basilar artery: Small caliber basilar artery likely due to left fetal and prominent right PCA circulation. Mild irregularity of the basilar artery with mild-to-moderate distal stenosis. Vertebral arteries: Patent.) Right proximal PICA moderate stenosis. No aneurysm identified. IMPRESSION: MRI head: Early subacute infarction involving the right PCA distribution and right posterior hemisphere watershed. Minimal petechial hemorrhage in right occipital lobe. Minimal local mass effect. MRA head: 1. Right proximal PCA occlusion with poor right PCA distribution collateralization. 2. Intracranial atherosclerosis with multiple segments of mild-to-moderate stenosis in the anterior and posterior  circulation. These results will be called to the ordering clinician or representative by the Radiologist Assistant, and communication documented in the PACS or zVision Dashboard. Electronically Signed   By: Mitzi Hansen M.D.   On: 05/08/2017 01:32   Echocardiogram:                                              PENDING  B/L Carotid Duplex        11/07/2016 Findings suggest 1-39% internal carotid artery stenosis bilaterally. Vertebral arteries are patent with antegrade flow.    IMPRESSION: [name] is a 77 y.o. female with PMH of HTN, HLD, DM, and PVD with 2-3 day history of left sided weakness. MRI reveals: 1. Exam reveals left visual field cut, right gaze preference and mild weakness of LUE and LLE.   Early/Subacute infarct Right PCA Infarct and Right posterior hemisphere watershed:  Suspected Etiology: small vessel disease Resultant Symptoms:  left visual field cut, right gaze preference and mild weakness of LUE and LLE.  Stroke Risk Factors: diabetes mellitus, hyperlipidemia and hypertension Other Stroke Risk Factors: Advanced age, Obesity, Body mass index is 39.31 kg/m. , CKD  Outstanding Stroke Work-up Studies:     Echocardiogram:                                                    PENDING  PLAN  05/08/2017: Continue Aspirin/ Plavix/ Statin Frequent neuro checks Telemetry monitoring PT/OT/SLP Consult PM & Rehab Consult Case Management /MSW  TEE and Loop Recorder Placement in AM Ongoing aggressive stroke risk factor management Patient counseled to be compliant  with her antithrombotic medications Patient counseled on Lifestyle modifications including, Diet, Exercise, and Stress Follow up with [facility] in 6 weeks  R/O AFIB: TEE and Loop Recorder Placement  HYPERTENSION: Stable, some elevated B/P's overnight Permissive hypertension (OK if <220/120) for 24-48 hours post stroke and then gradually normalized within 5-7 days. Long  term BP goal normotensive. May slowly restart home B/P medications after 48 hours Home Meds: Norvasc, Hyzaar  HYPERLIPIDEMIA:    Component Value Date/Time   CHOL 122 05/08/2017 0503   TRIG 179 (H) 05/08/2017 0503   HDL 28 (L) 05/08/2017 0503   CHOLHDL 4.4 05/08/2017 0503   VLDL 36 05/08/2017 0503   LDLCALC 58 05/08/2017 0503  Home Meds:  Lipitor 80 mg LDL  goal < 70 Continued on Lipitor to 80 mg daily Continue statin at discharge  DIABETES: Lab Results  Component Value Date   HGBA1C 13.0 (H) 11/08/2016  HgbA1c goal < 7.0 Currently on: Novolog Continue CBG monitoring and SSI to maintain glucose 140-180 mg/dl DM education   OBESITY Obesity, Body mass index is 39.31 kg/m. Greater than/equal to 30  Other Active Problems: Principal Problem:   Stroke [facility]) Active Problems:   Essential hypertension   Insulin dependent type 2 diabetes mellitus, uncontrolled (HCC)   History of CVA (cerebrovascular accident)    Hospital day # 0 VTE prophylaxis: Heparin Diet : Fall precautions Diet heart healthy/carb modified Room service appropriate? Yes; Fluid consistency: Thin Diet NPO time specified   FAMILY UPDATES: family at bedside  TEAM UPDATES: Briant Cedar, MD    Prior Home Stroke Medications:  aspirin 325 mg daily and clopidogrel 75 mg daily  Discharge Stroke Meds:  Please discharge patient on aspirin 325 mg daily and clopidogrel 75 mg daily   Disposition: 06-Home-Health Care Svc Therapy Recs:               PENDING Follow Up:  Follow-up Information    Nilda Riggs, NP. Schedule an appointment as soon as possible for a visit in 6 week(s).   Specialty:  Family Medicine Contact information: [address] [place] [address] [phone]          Hoy Register, MD -PCP Follow up in 1-2 weeks      Assessment & plan discussed with with attending physician and they are in agreement.    Beryl Meager, ANP-C Stroke Neurology  Team 05/08/2017 4:02 PM   05/08/2017 ATTENDING ASSESSMENT:   I reviewed above note and agree with the assessment and plan. I have made any additions or clarifications directly to the above note. Pt was seen and examined.   77 year old female with history of diabetes, hypertension, hyperlipidemia, peripheral vascular disease, on aspirin Plavix and statin at home admitted for left-sided weakness for 2-3 days, left facial droop, slurred speech and suffered a fall.  CT head showed right PCA subacute infarcts.  MRI showed right inferior MCA, MCA/PCA, MCA/ACA infarct as well as right CR infarcts.  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  CTA head neck showed stable right PCA severe stenosis, right M1 and M2 stenosis from 10/2016 and progressed right VA origin and severe left V4 stenosis from 10/2016.  No carotid stenosis.  EF 65-70%.  LDL 58 and A1c pending.  UDS pending.  Patient stroke embolic-looking, concerning for cardioembolic source.  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  Recommend TEE/loop recorder for  cardio embolic workup.  Continue aspirin Plavix and statin.  Marvel Plan, MD PhD Stroke Neurology 05/08/2017 5:55 PM    To contact Stroke Continuity provider, please refer to [email or web address]. After hours, contact General Neurology

## 2017-05-08 NOTE — Progress Notes (Signed)
PT Cancellation Note  Patient Details Name: Meghan Ford MRN: [id number] DOB: [birth year]   Cancelled Treatment:    Reason Eval/Treat Not Completed: Fatigue/lethargy limiting ability to participate Pt very sleepy and reporting she is feeling dizzy and did not want to participate with PT today. NT, [name], assisted with interpreting. Will follow up as schedule allows.   [name], PT, DPT  Acute Rehabilitation Services  Pager: [phone]   [name] 05/08/2017, 12:40 PM

## 2017-05-08 NOTE — Progress Notes (Signed)
PROGRESS NOTE  Meghan Ford RUE:[id number] DOB: [birth year] DOA: 05/07/2017 PCP: Hoy Register, MD  HPI/Recap of past 24 hours: Meghan Ford is a 77 y.o. female with medical history significant for DM, HTN, history of CVA, presented to the ED for evaluation of left-sided weakness and numbness, left facial droop, and speech difficulty for the past 3 days. Patient reportedly had a fall at home on 05/04/2017 without striking her head or losing consciousness. Following morning, family noted that she had a facial droop on the left and was weak throughout her left side.  She also had difficulty with her speech and reported a vision change involving the left eye. Speech difficulty has essentially resolved per the report of family, but focal weakness remains on presentation. CT head concerning for subacute infarcts. Neurology consulted and pt admitted for further management.  Pt is spanish speaking and an interpreter was used. Pt reports some numbness in LLE and pain in RLE, LLE still weak, denies any chest pain, SOB, N/V, abdomina pain.   Assessment/Plan: Principal Problem:   Stroke [facility]) Active Problems:   Essential hypertension   Insulin dependent type 2 diabetes mellitus, uncontrolled (HCC)   History of CVA (cerebrovascular accident)  Early subacute infarction involving the right PCA distribution CT head with extensive infarcts involving posterior right parietal and occipital lobes concerning for subacute watershed infarction MRI brain: Early subacute infarction involving the R PCA distribution and R posterior hemisphere watershed. Minimal petechial hemorrhage in right occipital lobe. Minimal local mass effect MRA head: Right proximal PCA occlusion with poor right PCA distribution collateralization CT angio head/neck: Severely diseased proximal Right PCA unchanged from previous. No significant carotid artery stenosis, with mild mostly intra carotid atherosclerosis    Neurology on boad: rec ASA, plavix, statin Echo pending LDL 58, repeat A1c pending  Cardiac monitoring, frequent neuro checks, PT/OT/SLP evals   Acute kidney injury Improving  SCr is 2.12 on admission, up from 1.00 in August 2018  IVF Start PO Amlodipine, continue imdur Hold home losartan-HCTZ    Type 2 DM with hyperglycemia Uncontrolled A1c was 13.0% in August 2018, repeat pending Start SSI, lantus 15U BID Home regimen with lantus 30 units BID   Hypertension No need for permissive HTN as pt had symptoms for days Started PO Amlodipine, held losartan-HCTZ due to AKI     Code Status: Full  Family Communication: None at beside  Disposition Plan: TBD   Consultants:  Neurology   Procedures:  None  Antimicrobials:  None  DVT prophylaxis:  Heparin   Objective: Vitals:   05/08/17 0400 05/08/17 0600 05/08/17 0700 05/08/17 0900  BP: (!) 157/62 (!) 171/59 (!) 159/57 (!) 158/67  Pulse: 75 78 80 81  Resp: (!) 27 (!) 24 (!) 21 (!) 22  Temp:      TempSrc:      SpO2: 93% 94% 92% 95%  Weight:       No intake or output data in the 24 hours ending 05/08/17 1141 Filed Weights   05/07/17 1529  Weight: 103.9 kg (229 lb)    Exam:   General:  NAD, oriented  Cardiovascular: S1, S2 present  Respiratory: CTA  Abdomen: Soft, obese, NT, ND, BS+  Musculoskeletal: No pedal edema bilaterally   Skin: Normal  Psychiatry: Normal  Neuro: LUE and LLE 4/5, RUE and RLE 5/5   Data Reviewed: CBC: Recent Labs  Lab 05/07/17 1545 05/07/17 1618  WBC 10.2  --   NEUTROABS 7.5  --   HGB 13.4  15.0  HCT 41.9 44.0  MCV 80.3  --   PLT 272  --    Basic Metabolic Panel: Recent Labs  Lab 05/07/17 1545 05/07/17 1618 05/08/17 0503  NA 134* 135 137  K 4.2 4.2 4.2  CL 96* 97* 100*  CO2 22  --  22  GLUCOSE 376* 374* 229*  BUN 51* 44* 45*  CREATININE 2.12* 1.90* 1.75*  CALCIUM 8.9  --  9.0   GFR: CrCl cannot be calculated (Unknown ideal weight.). Liver Function  Tests: Recent Labs  Lab 05/07/17 1545  AST 21  ALT 18  ALKPHOS 92  BILITOT 0.6  PROT 7.2  ALBUMIN 3.0*   No results for input(s): LIPASE, AMYLASE in the last 168 hours. No results for input(s): AMMONIA in the last 168 hours. Coagulation Profile: Recent Labs  Lab 05/07/17 1545  INR 1.02   Cardiac Enzymes: No results for input(s): CKTOTAL, CKMB, CKMBINDEX, TROPONINI in the last 168 hours. BNP (last 3 results) No results for input(s): PROBNP in the last 8760 hours. HbA1C: No results for input(s): HGBA1C in the last 72 hours. CBG: Recent Labs  Lab 05/08/17 0201 05/08/17 0412 05/08/17 0638  GLUCAP 277* 244* 209*   Lipid Profile: Recent Labs    05/08/17 0503  CHOL 122  HDL 28*  LDLCALC 58  TRIG 960*  CHOLHDL 4.4   Thyroid Function Tests: No results for input(s): TSH, T4TOTAL, FREET4, T3FREE, THYROIDAB in the last 72 hours. Anemia Panel: No results for input(s): VITAMINB12, FOLATE, FERRITIN, TIBC, IRON, RETICCTPCT in the last 72 hours. Urine analysis:    Component Value Date/Time   COLORURINE YELLOW 10/27/2014 2321   APPEARANCEUR CLEAR 10/27/2014 2321   LABSPEC 1.012 10/27/2014 2321   PHURINE 7.0 10/27/2014 2321   GLUCOSEU NEGATIVE 10/27/2014 2321   HGBUR NEGATIVE 10/27/2014 2321   BILIRUBINUR negative 08/25/2015 1515   KETONESUR NEGATIVE 10/27/2014 2321   PROTEINUR 100 08/25/2015 1515   PROTEINUR NEGATIVE 10/27/2014 2321   UROBILINOGEN 1.0 08/25/2015 1515   UROBILINOGEN 0.2 10/27/2014 2321   NITRITE negative 08/25/2015 1515   NITRITE NEGATIVE 10/27/2014 2321   LEUKOCYTESUR Negative 08/25/2015 1515   Sepsis Labs: @LABRCNTIP (procalcitonin:4,lacticidven:4)  )No results found for this or any previous visit (from the past 240 hour(s)).    Studies: Ct Head Wo Contrast  Result Date: 05/07/2017 CLINICAL DATA:  Left facial droop with left sided weakness and numbness and decreased vision in the left eye for 3 days. Last seen normal 05/02/2017. Transient  aphasia. EXAM: CT HEAD WITHOUT CONTRAST TECHNIQUE: Contiguous axial images were obtained from the base of the skull through the vertex without intravenous contrast. COMPARISON:  MRI brain 11/07/2016.  CT 11/06/2016. FINDINGS: Brain: Interval development of extensive encephalomalacia in the posterior right parietal and occipital lobes consistent with a late subacute cortical infarct. Patchy low-density in the posterior right periventricular white matter corresponds with the areas of clustered restricted diffusion on previous MRI. No definite signs of acute stroke. There is no evidence of acute intracranial hemorrhage, mass effect, midline shift or hydrocephalus. Vascular: Intracranial vascular calcifications. No hyperdense vessel identified. Skull: Negative for fracture or focal lesion. Sinuses/Orbits: The visualized paranasal sinuses and mastoid air cells are clear. No orbital abnormalities are seen. Other: None. IMPRESSION: 1. 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 Watershed infarcts are also present in the deep periventricular white matter. 2. No definite signs of  acute stroke or hemorrhage. Electronically Signed   By: Carey BullocksWilliam  Veazey M.D.   On: 05/07/2017 18:27   Mr Brain Wo Contrast  Result Date: 05/08/2017 CLINICAL DATA:  77 y/o F; 3 days of left-sided weakness, numbness, left facial droop, and speech difficulty. EXAM: MRI HEAD WITHOUT CONTRAST MRA HEAD WITHOUT CONTRAST TECHNIQUE: Multiplanar, multiecho pulse sequences of the brain and surrounding structures were obtained without intravenous contrast. Angiographic images of the head were obtained using MRA technique without contrast. COMPARISON:  11/07/2016 MRI head and MRA head.  05/07/2016 CT head. FINDINGS: MRI HEAD FINDINGS Brain: Reduced diffusion within the right posterior parietal and  occipital lobes with scattered small foci of reduced diffusion throughout the right superior parietal lobe and right posterior frontal lobe inclusive of precentral gyrus. Areas of infarction are associated with reduced diffusion and mild local mass effect compatible with early subacute infarction. Speckled foci of susceptibility hypointensity in right occipital lobe probably represent minimal petechial hemorrhage. Background of mild chronic microvascular ischemic changes and parenchymal volume loss of the brain. No hydrocephalus, extra-axial collection, effacement of basilar cisterns, or additional focus of mass effect. Vascular: As below. Skull and upper cervical spine: Normal marrow signal. Sinuses/Orbits: Mild ethmoid and frontal sinus antrum mucosal thickening. Otherwise negative. Other: None. MRA HEAD FINDINGS Internal carotid arteries:  Patent. Anterior cerebral arteries:  Patent. Middle cerebral arteries: Patent. Mild right M1 stenosis and A1 stenosis. Multiple segments of mild stenosis and bilateral M2 distributions. Anterior communicating artery: Patent. Posterior communicating arteries:  Patent.  Fetal left PCA. Posterior cerebral arteries: Right proximal PCA occlusion with poor distal PCA collateralization. Patent left proximal PCA and downstream circulation. Basilar artery: Small caliber basilar artery likely due to left fetal and prominent right PCA circulation. Mild irregularity of the basilar artery with mild-to-moderate distal stenosis. Vertebral arteries: Patent.) Right proximal PICA moderate stenosis. No aneurysm identified. IMPRESSION: MRI head: Early subacute infarction involving the right PCA distribution and right posterior hemisphere watershed. Minimal petechial hemorrhage in right occipital lobe. Minimal local mass effect. MRA head: 1. Right proximal PCA occlusion with poor right PCA distribution collateralization. 2. Intracranial atherosclerosis with multiple segments of mild-to-moderate  stenosis in the anterior and posterior circulation. These results will be called to the ordering clinician or representative by the Radiologist Assistant, and communication documented in the PACS or zVision Dashboard. Electronically Signed   By: Mitzi HansenLance  Furusawa-Stratton M.D.   On: 05/08/2017 01:32   Mr Maxine GlennMra Head Wo Contrast  Result Date: 05/08/2017 CLINICAL DATA:  77 y/o F; 3 days of left-sided weakness, numbness, left facial droop, and speech difficulty. EXAM: MRI HEAD WITHOUT CONTRAST MRA HEAD WITHOUT CONTRAST TECHNIQUE: Multiplanar, multiecho pulse sequences of the brain and surrounding structures were obtained without intravenous contrast. Angiographic images of the head were obtained using MRA technique without contrast. COMPARISON:  11/07/2016 MRI head and MRA head.  05/07/2016 CT head. FINDINGS: MRI HEAD FINDINGS Brain: Reduced diffusion within the right posterior parietal and occipital lobes with scattered small foci of reduced diffusion throughout the right superior parietal lobe and right posterior frontal lobe inclusive of precentral gyrus. Areas of infarction are associated with reduced diffusion and mild local mass effect compatible with early subacute infarction. Speckled foci of susceptibility hypointensity in right occipital lobe probably represent minimal petechial hemorrhage. Background of mild chronic microvascular ischemic changes and parenchymal volume loss of the brain. No hydrocephalus, extra-axial collection, effacement of basilar cisterns, or additional focus of mass effect. Vascular: As below. Skull and upper cervical spine: Normal marrow signal. Sinuses/Orbits:  Mild ethmoid and frontal sinus antrum mucosal thickening. Otherwise negative. Other: None. MRA HEAD FINDINGS Internal carotid arteries:  Patent. Anterior cerebral arteries:  Patent. Middle cerebral arteries: Patent. Mild right M1 stenosis and A1 stenosis. Multiple segments of mild stenosis and bilateral M2 distributions. Anterior  communicating artery: Patent. Posterior communicating arteries:  Patent.  Fetal left PCA. Posterior cerebral arteries: Right proximal PCA occlusion with poor distal PCA collateralization. Patent left proximal PCA and downstream circulation. Basilar artery: Small caliber basilar artery likely due to left fetal and prominent right PCA circulation. Mild irregularity of the basilar artery with mild-to-moderate distal stenosis. Vertebral arteries: Patent.) Right proximal PICA moderate stenosis. No aneurysm identified. IMPRESSION: MRI head: Early subacute infarction involving the right PCA distribution and right posterior hemisphere watershed. Minimal petechial hemorrhage in right occipital lobe. Minimal local mass effect. MRA head: 1. Right proximal PCA occlusion with poor right PCA distribution collateralization. 2. Intracranial atherosclerosis with multiple segments of mild-to-moderate stenosis in the anterior and posterior circulation. These results will be called to the ordering clinician or representative by the Radiologist Assistant, and communication documented in the PACS or zVision Dashboard. Electronically Signed   By: Mitzi Hansen M.D.   On: 05/08/2017 01:32    Scheduled Meds: .  stroke: mapping our early stages of recovery book   Does not apply Once  . amLODipine  10 mg Oral Daily  . aspirin  300 mg Rectal Daily   Or  . aspirin  325 mg Oral Daily  . atorvastatin  80 mg Oral q1800  . clopidogrel  75 mg Oral Daily  . gabapentin  600 mg Oral QHS  . heparin  5,000 Units Subcutaneous Q8H  . insulin aspart  0-9 Units Subcutaneous Q4H  . insulin glargine  15 Units Subcutaneous BID  . isosorbide mononitrate  60 mg Oral Daily    Continuous Infusions: . sodium chloride 100 mL/hr at 05/08/17 0235     LOS: 0 days     Briant Cedar, MD [facility]  If 7PM-7AM, please contact night-coverage [email or web address] Password [facility] 05/08/2017, 11:41 AM

## 2017-05-08 NOTE — ED Notes (Signed)
Pt arrives to [facility] at this time from MRI, family at bedside intending to leave for the night.

## 2017-05-08 NOTE — ED Notes (Signed)
Pt transported to CT in bed with transport team.

## 2017-05-08 NOTE — Progress Notes (Signed)
    [facility] has been requested to perform a transesophageal echocardiogram w/ MAC on Meghan Ford for CVA.  After careful review of history and examination, the risks and benefits of transesophageal echocardiogram have been explained including risks of esophageal damage, perforation (1:10,000 risk), bleeding, pharyngeal hematoma as well as other potential complications associated with conscious sedation including aspiration, arrhythmia, respiratory failure and death. Alternatives to treatment were discussed with pt and family. Questions were answered. Patient and family are willing to proceed. NPO at midnight.   Lyda Jester, PA-C  05/08/2017 3:22 PM

## 2017-05-08 NOTE — ED Notes (Signed)
Echo tech at bedside.

## 2017-05-08 NOTE — ED Notes (Signed)
Pt's Cane came up to [facility] and is currently with Pt.

## 2017-05-09 ENCOUNTER — Inpatient Hospital Stay (HOSPITAL_COMMUNITY)
Admission: EM | Disposition: A | Discharge status: Home Health | Payer: Self-pay | Source: Home / Self Care | Attending: Internal Medicine

## 2017-05-09 ENCOUNTER — Inpatient Hospital Stay (HOSPITAL_COMMUNITY): Payer: Medicaid Other | Admitting: Certified Registered Nurse Anesthetist

## 2017-05-09 ENCOUNTER — Inpatient Hospital Stay (HOSPITAL_COMMUNITY): Payer: Medicaid Other

## 2017-05-09 ENCOUNTER — Encounter (HOSPITAL_COMMUNITY)
Admission: EM | Disposition: A | Discharge status: Home Health | Payer: Self-pay | Source: Home / Self Care | Attending: Internal Medicine

## 2017-05-09 DIAGNOSIS — I639 Cerebral infarction, unspecified: Secondary | ICD-10-CM

## 2017-05-09 DIAGNOSIS — I6389 Other cerebral infarction: Secondary | ICD-10-CM

## 2017-05-09 DIAGNOSIS — E1140 Type 2 diabetes mellitus with diabetic neuropathy, unspecified: Secondary | ICD-10-CM

## 2017-05-09 HISTORY — PX: LOOP RECORDER INSERTION: EP1214

## 2017-05-09 HISTORY — PX: TEE WITHOUT CARDIOVERSION: SHX5443

## 2017-05-09 LAB — CBC WITH DIFFERENTIAL/PLATELET
BASOS PCT: 0 %
Basophils Absolute: 0 10*3/uL (ref 0.0–0.1)
EOS ABS: 0.1 10*3/uL (ref 0.0–0.7)
EOS PCT: 1 %
HCT: 38.9 % (ref 36.0–46.0)
HEMOGLOBIN: 12.1 g/dL (ref 12.0–15.0)
LYMPHS ABS: 2.8 10*3/uL (ref 0.7–4.0)
Lymphocytes Relative: 28 %
MCH: 24.9 pg — AB (ref 26.0–34.0)
MCHC: 31.1 g/dL (ref 30.0–36.0)
MCV: 80.2 fL (ref 78.0–100.0)
MONO ABS: 0.8 10*3/uL (ref 0.1–1.0)
MONOS PCT: 8 %
NEUTROS PCT: 63 %
Neutro Abs: 6.3 10*3/uL (ref 1.7–7.7)
PLATELETS: 253 10*3/uL (ref 150–400)
RBC: 4.85 MIL/uL (ref 3.87–5.11)
RDW: 13.4 % (ref 11.5–15.5)
WBC: 10.1 10*3/uL (ref 4.0–10.5)

## 2017-05-09 LAB — GLUCOSE, CAPILLARY
GLUCOSE-CAPILLARY: 177 mg/dL — AB (ref 65–99)
Glucose-Capillary: 121 mg/dL — ABNORMAL HIGH (ref 65–99)
Glucose-Capillary: 144 mg/dL — ABNORMAL HIGH (ref 65–99)
Glucose-Capillary: 147 mg/dL — ABNORMAL HIGH (ref 65–99)
Glucose-Capillary: 169 mg/dL — ABNORMAL HIGH (ref 65–99)

## 2017-05-09 LAB — BASIC METABOLIC PANEL
Anion gap: 11 (ref 5–15)
BUN: 34 mg/dL — AB (ref 6–20)
CALCIUM: 8.6 mg/dL — AB (ref 8.9–10.3)
CO2: 25 mmol/L (ref 22–32)
CREATININE: 1.51 mg/dL — AB (ref 0.44–1.00)
Chloride: 104 mmol/L (ref 101–111)
GFR calc non Af Amer: 33 mL/min — ABNORMAL LOW (ref >60)
GFR, EST AFRICAN AMERICAN: 38 mL/min — AB (ref >60)
Glucose, Bld: 131 mg/dL — ABNORMAL HIGH (ref 65–99)
Potassium: 4 mmol/L (ref 3.5–5.1)
SODIUM: 140 mmol/L (ref 135–145)

## 2017-05-09 LAB — HEMOGLOBIN A1C
HEMOGLOBIN A1C: 12.7 % — AB (ref 4.8–5.6)
Mean Plasma Glucose: 318 mg/dL

## 2017-05-09 LAB — PROTIME-INR
INR: 1.04
PROTHROMBIN TIME: 13.6 s (ref 11.4–15.2)

## 2017-05-09 LAB — UREA NITROGEN, URINE: UREA NITROGEN UR: 488 mg/dL

## 2017-05-09 SURGERY — ECHOCARDIOGRAM, TRANSESOPHAGEAL
Anesthesia: Monitor Anesthesia Care

## 2017-05-09 SURGERY — LOOP RECORDER INSERTION

## 2017-05-09 MED ORDER — ISOSORBIDE MONONITRATE ER 60 MG PO TB24
60.0000 mg | ORAL_TABLET | Freq: Every day | ORAL | 0 refills | Status: DC
Start: 2017-05-09 — End: 2017-05-14

## 2017-05-09 MED ORDER — BUPIVACAINE HCL (PF) 0.25 % IJ SOLN
INTRAMUSCULAR | Status: DC | PRN
  Administered 2017-05-09: 20 mL

## 2017-05-09 MED ORDER — GABAPENTIN 300 MG PO CAPS: 300.0000 mg | ORAL_CAPSULE | Freq: Every day | ORAL | Status: DC

## 2017-05-09 MED ORDER — BUTAMBEN-TETRACAINE-BENZOCAINE 2-2-14 % EX AERO
INHALATION_SPRAY | CUTANEOUS | Status: DC | PRN
  Administered 2017-05-09: 2 via TOPICAL

## 2017-05-09 MED ORDER — PROPOFOL 500 MG/50ML IV EMUL
INTRAVENOUS | Status: DC | PRN
  Administered 2017-05-09: 50 ug/kg/min via INTRAVENOUS

## 2017-05-09 MED ORDER — LACTATED RINGERS IV SOLN
INTRAVENOUS | Status: DC | PRN
  Administered 2017-05-09: 09:00:00 via INTRAVENOUS

## 2017-05-09 MED ORDER — INSULIN GLARGINE 100 UNIT/ML ~~LOC~~ SOLN: 35.0000 [IU] | Freq: Two times a day (BID) | SUBCUTANEOUS | Status: DC

## 2017-05-09 MED ORDER — LIDOCAINE-EPINEPHRINE 1 %-1:100000 IJ SOLN
INTRAMUSCULAR | Status: AC
Start: 2017-05-09 — End: ?
  Filled 2017-05-09: qty 1

## 2017-05-09 MED ORDER — SODIUM CHLORIDE BACTERIOSTATIC 0.9 % IJ SOLN
INTRAMUSCULAR | Status: DC | PRN
  Administered 2017-05-09: 9 mL via INTRAVENOUS

## 2017-05-09 MED ORDER — PROPOFOL 10 MG/ML IV BOLUS
INTRAVENOUS | Status: DC | PRN
  Administered 2017-05-09: 10 mg via INTRAVENOUS
  Administered 2017-05-09: 20 mg via INTRAVENOUS
  Administered 2017-05-09: 15 mg via INTRAVENOUS

## 2017-05-09 MED ORDER — SENNOSIDES-DOCUSATE SODIUM 8.6-50 MG PO TABS: 1.0000 | ORAL_TABLET | Freq: Every evening | ORAL | 0 refills | Status: AC | PRN

## 2017-05-09 MED ORDER — CLOPIDOGREL BISULFATE 75 MG PO TABS: 75.0000 mg | ORAL_TABLET | Freq: Every day | ORAL | 0 refills | Status: DC

## 2017-05-09 SURGICAL SUPPLY — 2 items
LOOP REVEAL LINQSYS (Prosthesis & Implant Heart) ×2 IMPLANT
PACK LOOP INSERTION (CUSTOM PROCEDURE TRAY) ×3 IMPLANT

## 2017-05-09 NOTE — Consult Note (Addendum)
ELECTROPHYSIOLOGY CONSULT NOTE  Patient ID: Meghan Ford MRN: [id number], DOB/AGE: 1940/10/31   Admit date: 05/07/2017 Date of Consult: 05/09/2017  Primary Physician: Charlott Rakes, MD Primary Cardiologist: new to [facility] Reason for Consultation: Cryptogenic stroke; recommendations regarding Implantable Loop Recorder  History of Present Illness EP has been asked to evaluate Rhett Bannister for placement of an implantable loop recorder to monitor for atrial fibrillation by Dr Erlinda Hong.  The patient was admitted on 05/07/2017 with a 2-3 day history of left sided weakness.  Imaging demonstrated early/subacute right PCA infarct and right posterior hemisphere watershed.  she has undergone workup for stroke including echocardiogram and carotid dopplers.  The patient has been monitored on telemetry which has demonstrated sinus rhythm with no arrhythmias. TEE was unrevealing.   Echocardiogram this admission demonstrated EF 65-70%, no RWMA, LA 35.  Lab work is reviewed.  Prior to admission, the patient denies chest pain, shortness of breath, dizziness, palpitations, or syncope.  They are recovering from their stroke with plans to return home at discharge.  Past Medical History:  Diagnosis Date  . Diabetes mellitus type II, uncontrolled ([place])   . Diabetes mellitus without complication ([place])   . Fx humeral neck   . Hyperlipidemia LDL goal < 100   . Hypertension   . Leg pain    worse with prolonged standing  . Peripheral vascular disease ([place])   . Stroke [facility]) 2018; 05/08/2017   denies residual from 2018 stroke on 05/08/2017; weakness on left; speech issues from today's stroke (05/08/2017)  . Varicose veins      Surgical History:  Past Surgical History:  Procedure Laterality Date  . FRACTURE SURGERY    . REVERSE SHOULDER ARTHROPLASTY Left 10/27/2014   Procedure: REVERSE SHOULDER ARTHROPLASTY;  Surgeon: Renette Butters, MD;  Location: [place];  Service: Orthopedics;   Laterality: Left;  . TOTAL SHOULDER ARTHROPLASTY Left 10/27/2014   Procedure: TOTAL SHOULDER ARTHROPLASTY;  Surgeon: Renette Butters, MD;  Location: [place];  Service: Orthopedics;  Laterality: Left;  Marland Kitchen VEIN SURGERY Left    left leg     Medications Prior to Admission  Medication Sig Dispense Refill Last Dose  . amLODipine (NORVASC) 10 MG tablet Take 10 mg by mouth daily.   05/07/2017 at Unknown time  . aspirin 325 MG tablet Take 1 tablet (325 mg total) by mouth daily. 30 tablet 2 05/07/2017 at Unknown time  . atorvastatin (LIPITOR) 80 MG tablet Take 1 tablet (80 mg total) by mouth daily. 30 tablet 5 05/07/2017 at Unknown time  . blood glucose meter kit and supplies KIT Dispense based on patient and insurance preference. Use up to four times daily as directed. (FOR ICD-9 250.00, 250.01). 1 each 0 Past Week at Unknown time  . glucose blood test strip Use as instructed 100 each 12 Past Week at Unknown time  . losartan-hydrochlorothiazide (HYZAAR) 100-25 MG tablet Take 1 tablet by mouth daily. 30 tablet 5 05/07/2017 at Unknown time  . TRUEPLUS LANCETS 28G MISC 1 each by Does not apply route 3 (three) times daily before meals. 100 each 12 Past Week at Unknown time    Inpatient Medications:  .  stroke: mapping our early stages of recovery book   Does not apply Once  . amLODipine  10 mg Oral Daily  . aspirin  300 mg Rectal Daily   Or  . aspirin  325 mg Oral Daily  . atorvastatin  80 mg Oral q1800  . clopidogrel  75 mg  Oral Daily  . gabapentin  600 mg Oral QHS  . heparin  5,000 Units Subcutaneous Q8H  . insulin aspart  0-9 Units Subcutaneous Q4H  . insulin aspart  3 Units Subcutaneous TID WC  . insulin glargine  15 Units Subcutaneous BID  . isosorbide mononitrate  60 mg Oral Daily  . pneumococcal 23 valent vaccine  0.5 mL Intramuscular Tomorrow-1000    Allergies: No Known Allergies  Social History   Socioeconomic History  . Marital status: Single    Spouse name: Not on file  . Number of  children: Not on file  . Years of education: Not on file  . Highest education level: Not on file  Social Needs  . Financial resource strain: Not on file  . Food insecurity - worry: Not on file  . Food insecurity - inability: Not on file  . Transportation needs - medical: Not on file  . Transportation needs - non-medical: Not on file  Occupational History  . Not on file  Tobacco Use  . Smoking status: Never Smoker  . Smokeless tobacco: Never Used  Substance and Sexual Activity  . Alcohol use: No  . Drug use: No  . Sexual activity: Not on file  Other Topics Concern  . Not on file  Social History Narrative   ** Merged History Encounter **       Lives with son [name] who comes with her to majority of visits and helps coordinate care for her. He translates for her and has signed and patient signed a release for this on 02/02/12.            Family History  Problem Relation Age of Onset  . Cancer Brother   . Heart disease Sister       Review of Systems: All other systems reviewed and are otherwise negative except as noted above.  Physical Exam: Vitals:   05/09/17 0940 05/09/17 0950 05/09/17 1000 05/09/17 1205  BP: 123/60 (!) 136/58 (!) 128/50 125/61  Pulse: 71 71 70 70  Resp: (!) 25 (!) '22 17 18  '$ Temp:    98.6 F (37 C)  TempSrc:    Oral  SpO2: 96% 94% 95% 97%  Weight:        GEN- The patient is elderly and obese appearing, alert and oriented x 3 today.   Head- normocephalic, atraumatic Eyes-  Sclera clear, conjunctiva pink Ears- hearing intact Oropharynx- clear Neck- supple Lungs- Clear to ausculation bilaterally, normal work of breathing Heart- Regular rate and rhythm  GI- soft, NT, ND, + BS Extremities- no clubbing, cyanosis, or edema MS- no significant deformity or atrophy Skin- no rash or lesion Psych- euthymic mood, full affect   Labs:   Lab Results  Component Value Date   WBC 10.1 05/09/2017   HGB 12.1 05/09/2017   HCT 38.9 05/09/2017    MCV 80.2 05/09/2017   PLT 253 05/09/2017    Recent Labs  Lab 05/07/17 1545  05/09/17 0744  NA 134*   < > 140  K 4.2   < > 4.0  CL 96*   < > 104  CO2 22   < > 25  BUN 51*   < > 34*  CREATININE 2.12*   < > 1.51*  CALCIUM 8.9   < > 8.6*  PROT 7.2  --   --   BILITOT 0.6  --   --   ALKPHOS 92  --   --   ALT 18  --   --  AST 21  --   --   GLUCOSE 376*   < > 131*   < > = values in this interval not displayed.     Radiology/Studies: Ct Angio Head W Or Wo Contrast  Result Date: 05/08/2017 CLINICAL DATA:  77 year old female with right PCA and scattered posterior right MCA territory acute infarcts discovered following presentation of decreased left side vision and left side weakness for several days. Proximal right PCA occlusion on intracranial MRA. EXAM: CT ANGIOGRAPHY HEAD AND NECK TECHNIQUE: Multidetector CT imaging of the head and neck was performed using the standard protocol during bolus administration of intravenous contrast. Multiplanar CT image reconstructions and MIPs were obtained to evaluate the vascular anatomy. Carotid stenosis measurements (when applicable) are obtained utilizing NASCET criteria, using the distal internal carotid diameter as the denominator. CONTRAST:  17m ISOVUE-370 IOPAMIDOL (ISOVUE-370) INJECTION 76% COMPARISON:  Brain MRI and intracranial MRA 0051 hours today. Head CT without contrast 05/07/2017. brain MRI and intracranial MRA 11/07/2016 FINDINGS: CTA NECK Skeleton: Absent dentition. No acute osseous abnormality identified. Upper chest: Negative lung apices. No superior mediastinal lymphadenopathy. Other neck: Negative aside from a partially retropharyngeal course of both carotid arteries (normal variant). No neck mass or lymphadenopathy. Aortic arch: 3 vessel arch configuration. Moderate calcified plaque in the arch but primarily distal to the great vessel origins. Right carotid system: Tortuous brachiocephalic artery with mild soft plaque. No associated stenosis.  Tortuous proximal right CCA. No right CCA stenosis. Retropharyngeal right carotid bifurcation appears normal aside from mild tortuosity. No cervical right ICA stenosis. Left carotid system: No left CCA origin stenosis. Mildly tortuous left CCA. Minor soft plaque in the left CCA proximal to the bifurcation without stenosis. Widely patent left carotid bifurcation. Partially retropharyngeal right ECA. Tortuous but otherwise negative cervical left ICA. Vertebral arteries: Tortuous right subclavian artery origin with a kinked appearance and mild calcified plaque but no significant stenosis. However, there is moderate to severe stenosis at the right vertebral artery origin related to calcified and soft plaque, best seen on series 9, image 82. The right vertebral otherwise is normal to the skull base. Tortuous proximal left subclavian artery with soft plaque and a kinked appearance but no significant stenosis. The left vertebral artery origin appears only mildly irregular without significant stenosis on series 8, image 148. Tortuous left V1 segment. The left vertebral artery is mildly non dominant but patent to the skull base without stenosis. CTA HEAD Posterior circulation: The distal right vertebral artery is mildly dominant and irregular throughout the V4 segment. No significant stenosis. The right PICA origin is patent. There is soft plaque or thrombus in the left vertebral artery V4 segment resulting in radiographic string sign stenosis or short segment occlusion of the vessel which is proximal to that which was visible on the intracranial MRA earlier today, but was partially demonstrated on the 2018 MRA. The appearance since that time has mildly progressed. See series 8, image 131. There is prominent calcified plaque at the distal aspect of this stenosis or occlusion. There is reconstituted flow in the distal left V4 segment, which appears to than functionally terminates in the left PICA origin which remains patent.  The basilar artery is patent but diminutive and irregular, and appears stable since 2018. The SCA origins are patent. There is a fetal type left PCA origin with only a diminutive left P1 segment identified, stable since 2018. The proximal right PCA is chronically abnormal since August 2018, and it is unclear whether there was originally fetal type  right PCA origin. Chronically there is only is a faint thread-like right P1 segment. Only diminutive right posterior communicating artery enhancement is identified (such as on series 9, image 97. There is mild to moderate reconstituted flow in the right PCA P2 and distal branches. Overall the appearance of the proximal right PCA and P com has not significantly changed since 11/07/2016. The contralateral left PCA branches are patent with mild to moderate P3 segment irregularity and stenosis. Anterior circulation: Both ICA siphons are patent. There is mild bilateral calcified siphon plaque without stenosis. The bilateral ophthalmic artery origins and the left posterior communicating artery origin are normal. Patent carotid termini. There is mild stenosis at the left ACA origin. The right A1 is normal. The anterior communicating artery and bilateral proximal ACA branches are normal. There is mild irregularity in the distal ACA branches. The left MCA origin and M1 segment are patent without stenosis. The left MCA bifurcation is patent. There is only mild left MCA M3 branch irregularity. The right MCA M1 origin is normal. The mid right M1 segment is mildly irregular and stenotic as seen on series 10, image 20, and this is stable since 2018. The distal right M1 has a normal caliber. A right MCA trifurcation is patent. There is no right MCA branch occlusion identified. The dominant posterior M2 branch demonstrates mild to moderate irregularity and stenosis as seen on series 12, image 16 which is stable since 2018. Venous sinuses: Patent on the delayed images. Anatomic variants:  Mildly dominant right vertebral artery, and the distal left vertebral artery is diminutive beyond the left PICA. Fetal type left PCA origin. Questionable fetal type right PCA origin. Delayed phase: Right parietal and occipital lobe cytotoxic edema appears stable since yesterday. Small areas of cytotoxic edema in the posterior right frontal lobe also appears stable (series 13, image 20). Mild post ischemic enhancement in the more confluent areas of infarct. No acute intracranial hemorrhage identified. No intracranial mass effect. Stable gray-white matter differentiation elsewhere. Review of the MIP images confirms the above findings IMPRESSION: 1. Severely diseased proximal Right PCA not significantly changed in appearance since an MRA in August 2018. It is unclear whether the vessel was originally a fetal type right PCA, but there is poor enhancement of diminutive Right Pcomm and Right P1 segments. There is mild to moderate reconstituted enhancement in the distal right PCA. 2. Superimposed chronic Right MCA M1 and dominant posterior Right M2 branch irregularity and stenosis, which appears atherosclerosis related and is also stable since the August 2018 MRA. 3. 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 4. 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 But there is reconstituted flow distally and the Left PICA origin remains patent. 5. Chronically diminutive and irregular Basilar Artery, stable since 2018. 6. No significant carotid artery stenosis, with mild mostly intra carotid atherosclerosis. 7. Stable CT appearance of the brain since yesterday. Electronically Signed   By: Genevie Ann M.D.   On: 05/08/2017 11:48   Ct Head Wo Contrast  Result Date: 05/07/2017 CLINICAL DATA:  Left facial droop with left sided weakness and numbness and decreased vision  in the left eye for 3 days. Last seen normal 05/02/2017. Transient aphasia. EXAM: CT HEAD WITHOUT CONTRAST TECHNIQUE: Contiguous axial images were obtained from the base of the skull through the vertex without intravenous contrast. COMPARISON:  MRI brain 11/07/2016.  CT  11/06/2016. FINDINGS: Brain: Interval development of extensive encephalomalacia in the posterior right parietal and occipital lobes consistent with a late subacute cortical infarct. Patchy low-density in the posterior right periventricular white matter corresponds with the areas of clustered restricted diffusion on previous MRI. No definite signs of acute stroke. There is no evidence of acute intracranial hemorrhage, mass effect, midline shift or hydrocephalus. Vascular: Intracranial vascular calcifications. No hyperdense vessel identified. Skull: Negative for fracture or focal lesion. Sinuses/Orbits: The visualized paranasal sinuses and mastoid air cells are clear. No orbital abnormalities are seen. Other: None. IMPRESSION: 1. 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 Watershed infarcts are also present in the deep periventricular white matter. 2. No definite signs of acute stroke or hemorrhage. Electronically Signed   By: Richardean Sale M.D.   On: 05/07/2017 18:27   Ct Angio Neck W Or Wo Contrast  Result Date: 05/08/2017 CLINICAL DATA:  77 year old female with right PCA and scattered posterior right MCA territory acute infarcts discovered following presentation of decreased left side vision and left side weakness for several days. Proximal right PCA occlusion on intracranial MRA. EXAM: CT ANGIOGRAPHY HEAD AND NECK TECHNIQUE: Multidetector CT imaging of the head and neck was performed using the standard protocol during bolus administration of intravenous contrast. Multiplanar CT image  reconstructions and MIPs were obtained to evaluate the vascular anatomy. Carotid stenosis measurements (when applicable) are obtained utilizing NASCET criteria, using the distal internal carotid diameter as the denominator. CONTRAST:  37m ISOVUE-370 IOPAMIDOL (ISOVUE-370) INJECTION 76% COMPARISON:  Brain MRI and intracranial MRA 0051 hours today. Head CT without contrast 05/07/2017. brain MRI and intracranial MRA 11/07/2016 FINDINGS: CTA NECK Skeleton: Absent dentition. No acute osseous abnormality identified. Upper chest: Negative lung apices. No superior mediastinal lymphadenopathy. Other neck: Negative aside from a partially retropharyngeal course of both carotid arteries (normal variant). No neck mass or lymphadenopathy. Aortic arch: 3 vessel arch configuration. Moderate calcified plaque in the arch but primarily distal to the great vessel origins. Right carotid system: Tortuous brachiocephalic artery with mild soft plaque. No associated stenosis. Tortuous proximal right CCA. No right CCA stenosis. Retropharyngeal right carotid bifurcation appears normal aside from mild tortuosity. No cervical right ICA stenosis. Left carotid system: No left CCA origin stenosis. Mildly tortuous left CCA. Minor soft plaque in the left CCA proximal to the bifurcation without stenosis. Widely patent left carotid bifurcation. Partially retropharyngeal right ECA. Tortuous but otherwise negative cervical left ICA. Vertebral arteries: Tortuous right subclavian artery origin with a kinked appearance and mild calcified plaque but no significant stenosis. However, there is moderate to severe stenosis at the right vertebral artery origin related to calcified and soft plaque, best seen on series 9, image 82. The right vertebral otherwise is normal to the skull base. Tortuous proximal left subclavian artery with soft plaque and a kinked appearance but no significant stenosis. The left vertebral artery origin appears only mildly irregular  without significant stenosis on series 8, image 148. Tortuous left V1 segment. The left vertebral artery is mildly non dominant but patent to the skull base without stenosis. CTA HEAD Posterior circulation: The distal right vertebral artery is mildly dominant and irregular throughout the V4 segment. No significant stenosis. The right PICA origin is patent. There is soft plaque or thrombus in the left vertebral artery V4 segment resulting in radiographic string sign stenosis or short segment occlusion of the vessel  which is proximal to that which was visible on the intracranial MRA earlier today, but was partially demonstrated on the 2018 MRA. The appearance since that time has mildly progressed. See series 8, image 131. There is prominent calcified plaque at the distal aspect of this stenosis or occlusion. There is reconstituted flow in the distal left V4 segment, which appears to than functionally terminates in the left PICA origin which remains patent. The basilar artery is patent but diminutive and irregular, and appears stable since 2018. The SCA origins are patent. There is a fetal type left PCA origin with only a diminutive left P1 segment identified, stable since 2018. The proximal right PCA is chronically abnormal since August 2018, and it is unclear whether there was originally fetal type right PCA origin. Chronically there is only is a faint thread-like right P1 segment. Only diminutive right posterior communicating artery enhancement is identified (such as on series 9, image 97. There is mild to moderate reconstituted flow in the right PCA P2 and distal branches. Overall the appearance of the proximal right PCA and P com has not significantly changed since 11/07/2016. The contralateral left PCA branches are patent with mild to moderate P3 segment irregularity and stenosis. Anterior circulation: Both ICA siphons are patent. There is mild bilateral calcified siphon plaque without stenosis. The bilateral  ophthalmic artery origins and the left posterior communicating artery origin are normal. Patent carotid termini. There is mild stenosis at the left ACA origin. The right A1 is normal. The anterior communicating artery and bilateral proximal ACA branches are normal. There is mild irregularity in the distal ACA branches. The left MCA origin and M1 segment are patent without stenosis. The left MCA bifurcation is patent. There is only mild left MCA M3 branch irregularity. The right MCA M1 origin is normal. The mid right M1 segment is mildly irregular and stenotic as seen on series 10, image 20, and this is stable since 2018. The distal right M1 has a normal caliber. A right MCA trifurcation is patent. There is no right MCA branch occlusion identified. The dominant posterior M2 branch demonstrates mild to moderate irregularity and stenosis as seen on series 12, image 16 which is stable since 2018. Venous sinuses: Patent on the delayed images. Anatomic variants: Mildly dominant right vertebral artery, and the distal left vertebral artery is diminutive beyond the left PICA. Fetal type left PCA origin. Questionable fetal type right PCA origin. Delayed phase: Right parietal and occipital lobe cytotoxic edema appears stable since yesterday. Small areas of cytotoxic edema in the posterior right frontal lobe also appears stable (series 13, image 20). Mild post ischemic enhancement in the more confluent areas of infarct. No acute intracranial hemorrhage identified. No intracranial mass effect. Stable gray-white matter differentiation elsewhere. Review of the MIP images confirms the above findings IMPRESSION: 1. Severely diseased proximal Right PCA not significantly changed in appearance since an MRA in August 2018. It is unclear whether the vessel was originally a fetal type right PCA, but there is poor enhancement of diminutive Right Pcomm and Right P1 segments. There is mild to moderate reconstituted enhancement in the distal  right PCA. 2. Superimposed chronic Right MCA M1 and dominant posterior Right M2 branch irregularity and stenosis, which appears atherosclerosis related and is also stable since the August 2018 MRA. 3. 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 4. 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 But there is reconstituted flow distally and the Left PICA origin remains patent. 5. Chronically diminutive and irregular Basilar Artery, stable since 2018. 6. No significant carotid artery stenosis, with mild mostly intra carotid atherosclerosis. 7. Stable CT appearance of the brain since yesterday. Electronically Signed   By: Genevie Ann M.D.   On: 05/08/2017 11:48   Mr Brain Wo Contrast  Result Date: 05/08/2017 CLINICAL DATA:  77 y/o F; 3 days of left-sided weakness, numbness, left facial droop, and speech difficulty. EXAM: MRI HEAD WITHOUT CONTRAST MRA HEAD WITHOUT CONTRAST TECHNIQUE: Multiplanar, multiecho pulse sequences of the brain and surrounding structures were obtained without intravenous contrast. Angiographic images of the head were obtained using MRA technique without contrast. COMPARISON:  11/07/2016 MRI head and MRA head.  05/07/2016 CT head. FINDINGS: MRI HEAD FINDINGS Brain: Reduced diffusion within the right posterior parietal and occipital lobes with scattered small foci of reduced diffusion throughout the right superior parietal lobe and right posterior frontal lobe inclusive of precentral gyrus. Areas of infarction are associated with reduced diffusion and mild local mass effect compatible with early subacute infarction. Speckled foci of susceptibility hypointensity in right occipital lobe probably represent minimal petechial hemorrhage. Background of mild chronic microvascular ischemic changes and parenchymal volume loss of the brain. No  hydrocephalus, extra-axial collection, effacement of basilar cisterns, or additional focus of mass effect. Vascular: As below. Skull and upper cervical spine: Normal marrow signal. Sinuses/Orbits: Mild ethmoid and frontal sinus antrum mucosal thickening. Otherwise negative. Other: None. MRA HEAD FINDINGS Internal carotid arteries:  Patent. Anterior cerebral arteries:  Patent. Middle cerebral arteries: Patent. Mild right M1 stenosis and A1 stenosis. Multiple segments of mild stenosis and bilateral M2 distributions. Anterior communicating artery: Patent. Posterior communicating arteries:  Patent.  Fetal left PCA. Posterior cerebral arteries: Right proximal PCA occlusion with poor distal PCA collateralization. Patent left proximal PCA and downstream circulation. Basilar artery: Small caliber basilar artery likely due to left fetal and prominent right PCA circulation. Mild irregularity of the basilar artery with mild-to-moderate distal stenosis. Vertebral arteries: Patent.) Right proximal PICA moderate stenosis. No aneurysm identified. IMPRESSION: MRI head: Early subacute infarction involving the right PCA distribution and right posterior hemisphere watershed. Minimal petechial hemorrhage in right occipital lobe. Minimal local mass effect. MRA head: 1. Right proximal PCA occlusion with poor right PCA distribution collateralization. 2. Intracranial atherosclerosis with multiple segments of mild-to-moderate stenosis in the anterior and posterior circulation. These results Jwan Hornbaker be called to the ordering clinician or representative by the Radiologist Assistant, and communication documented in the PACS or zVision Dashboard. Electronically Signed   By: Kristine Garbe M.D.   On: 05/08/2017 01:32   Mr Jodene Nam Head Wo Contrast  Result Date: 05/08/2017 CLINICAL DATA:  77 y/o F; 3 days of left-sided weakness, numbness, left facial droop, and speech difficulty. EXAM: MRI HEAD WITHOUT CONTRAST MRA HEAD WITHOUT CONTRAST  TECHNIQUE: Multiplanar, multiecho pulse sequences of the brain and surrounding structures were obtained without intravenous contrast. Angiographic images of the head were obtained using MRA technique without contrast. COMPARISON:  11/07/2016 MRI head and MRA head.  05/07/2016 CT head. FINDINGS: MRI HEAD FINDINGS Brain: Reduced diffusion within the right posterior parietal and occipital lobes with scattered small foci of reduced diffusion throughout the right superior parietal lobe and right posterior frontal lobe inclusive of precentral gyrus. Areas of infarction are associated with reduced diffusion and mild local mass effect compatible with early subacute infarction. Speckled foci of susceptibility hypointensity in right occipital lobe  probably represent minimal petechial hemorrhage. Background of mild chronic microvascular ischemic changes and parenchymal volume loss of the brain. No hydrocephalus, extra-axial collection, effacement of basilar cisterns, or additional focus of mass effect. Vascular: As below. Skull and upper cervical spine: Normal marrow signal. Sinuses/Orbits: Mild ethmoid and frontal sinus antrum mucosal thickening. Otherwise negative. Other: None. MRA HEAD FINDINGS Internal carotid arteries:  Patent. Anterior cerebral arteries:  Patent. Middle cerebral arteries: Patent. Mild right M1 stenosis and A1 stenosis. Multiple segments of mild stenosis and bilateral M2 distributions. Anterior communicating artery: Patent. Posterior communicating arteries:  Patent.  Fetal left PCA. Posterior cerebral arteries: Right proximal PCA occlusion with poor distal PCA collateralization. Patent left proximal PCA and downstream circulation. Basilar artery: Small caliber basilar artery likely due to left fetal and prominent right PCA circulation. Mild irregularity of the basilar artery with mild-to-moderate distal stenosis. Vertebral arteries: Patent.) Right proximal PICA moderate stenosis. No aneurysm identified.  IMPRESSION: MRI head: Early subacute infarction involving the right PCA distribution and right posterior hemisphere watershed. Minimal petechial hemorrhage in right occipital lobe. Minimal local mass effect. MRA head: 1. Right proximal PCA occlusion with poor right PCA distribution collateralization. 2. Intracranial atherosclerosis with multiple segments of mild-to-moderate stenosis in the anterior and posterior circulation. These results Oneal Schoenberger be called to the ordering clinician or representative by the Radiologist Assistant, and communication documented in the PACS or zVision Dashboard. Electronically Signed   By: Kristine Garbe M.D.   On: 05/08/2017 01:32    12-lead ECG sinus rhythm (personally reviewed) All prior EKG's in EPIC reviewed with no documented atrial fibrillation  Telemetry sinus rhythm (personally reviewed)  Assessment and Plan:  1. Cryptogenic stroke The patient presents with cryptogenic stroke.  TEE was unrevealing.   I spoke at length with the patient and son about monitoring for afib with an implantable loop recorder.  Risks, benefits, and alteratives to implantable loop recorder were discussed with the patient today.   At this time, the patient is very clear in their decision to proceed with implantable loop recorder. We discussed financial implications of long term monitoring. Family is aware and would like to proceed with ILR at this time.   Wound care was reviewed with the patient (keep incision clean and dry for 3 days).  Wound check scheduled and entered in AVS.  Please call with questions.   Chanetta Marshall, NP 05/09/2017 3:48 PM  I have seen and examined this patient with Chanetta Marshall.  Agree with above, note added to reflect my findings.  On exam, RRR, no murmurs, lungs clear.   Patient admitted to the hospital for cryptogenic stroke.  No cause has been determined yet.  TEE was unrevealing.  We Carney Saxton plan for link monitor.  Risks and benefits were discussed and  include bleeding and infection.  The patient and her son understand the risks and agreed to the procedure.  Wane Mollett M. Evea Sheek MD 05/09/2017 4:17 PM

## 2017-05-09 NOTE — Anesthesia Postprocedure Evaluation (Signed)
Anesthesia Post Note  Patient: Meghan Ford  Procedure(s) Performed: TRANSESOPHAGEAL ECHOCARDIOGRAM (TEE) (N/A )     Patient location during evaluation: PACU Anesthesia Type: MAC Level of consciousness: awake and alert Pain management: pain level controlled Vital Signs Assessment: post-procedure vital signs reviewed and stable Respiratory status: spontaneous breathing, nonlabored ventilation, respiratory function stable and patient connected to nasal cannula oxygen Cardiovascular status: stable and blood pressure returned to baseline Postop Assessment: no apparent nausea or vomiting Anesthetic complications: no    Last Vitals:  Vitals:   05/09/17 1000 05/09/17 1205  BP: (!) 128/50 125/61  Pulse: 70 70  Resp: 17 18  Temp:  37 C  SpO2: 95% 97%    Last Pain:  Vitals:   05/09/17 1205  TempSrc: Oral                 Tiajuana Amass

## 2017-05-09 NOTE — Progress Notes (Signed)
  Echocardiogram 2D Echocardiogram has been performed.  Meghan Ford 05/09/2017, 10:04 AM

## 2017-05-09 NOTE — CV Procedure (Signed)
   TEE  Ind: stroke  Time out performed  Propofol with anesthesia present  Findings:  - Normal EF  - Trace MR/TR  - No LAA thrombus  - Negative bubble study  No embolic source.   Donato SchultzMark Kody Brandl, MD

## 2017-05-09 NOTE — Evaluation (Signed)
Physical Therapy Evaluation Patient Details Name: Meghan Ford MRN: [id number] DOB: 1940/11/24 Today's Date: 05/09/2017   History of Present Illness  Meghan Ford a 77 y.o.femalewith medical history significant forinsulin-dependent diabetes mellitus, hypertension, and history of CVA, now presenting to the emergency department for evaluation of left-sided weakness and numbness, left facial droop, and speech difficulty for the past 3 days. Pt found to have R parietal and occipital infarcts as well as cortical infarcts.  Clinical Impression  Pt presenting with mild L sided weakness. Pt was indep PTA per son. Pt now requiring cane for ambulation and minimal assist for transfers and ADLs. Pt requires 24/7 assist for safe d/c home. Son reports someone will be there with patient 24/7 and they have hand rails and cameras installed in home. Son did translate t/o session this date. Acute PT to con't to following.    Follow Up Recommendations Home health PT;Supervision/Assistance - 24 hour    Equipment Recommendations  None recommended by PT(has cane)    Recommendations for Other Services       Precautions / Restrictions Precautions Precautions: Fall Precaution Comments: Pt with previous falls Restrictions Weight Bearing Restrictions: No      Mobility  Bed Mobility               General bed mobility comments: pt up in chair upon PT arrival  Transfers Overall transfer level: Needs assistance Equipment used: 1 person hand held assist Transfers: Sit to/from Stand Sit to Stand: Mod assist         General transfer comment: modA for initial power up and to steady upon standing, wide base of support, increased time  Ambulation/Gait Ambulation/Gait assistance: Min assist Ambulation Distance (Feet): 120 Feet Assistive device: Straight cane Gait Pattern/deviations: Step-through pattern;Decreased stride length Gait velocity: slow Gait velocity  interpretation: Below normal speed for age/gender General Gait Details: wide base of support, v/c's for sequencing cane, pt with no L knee buckling but decreased overall step length bilaterally.   Stairs            Wheelchair Mobility    Modified Rankin (Stroke Patients Only) Modified Rankin (Stroke Patients Only) Pre-Morbid Rankin Score: No significant disability Modified Rankin: Moderate disability     Balance Overall balance assessment: Needs assistance Sitting-balance support: Feet supported Sitting balance-Leahy Scale: Fair Sitting balance - Comments: posterior lean with onset of fatigue   Standing balance support: Single extremity supported Standing balance-Leahy Scale: Poor Standing balance comment: pt unsteady without single UE support                             Pertinent Vitals/Pain Pain Assessment: No/denies pain    Home Living Family/patient expects to be discharged to:: Private residence Living Arrangements: Alone(son reports someone will be with her) Available Help at Discharge: Family;Available 24 hours/day Type of Home: House Home Access: Level entry     Home Layout: Able to live on main level with bedroom/bathroom Home Equipment: Bedside commode;Cane - single point;Shower seat      Prior Function Level of Independence: Needs assistance   Gait / Transfers Assistance Needed: walks with cane occasionally  ADL's / Homemaking Assistance Needed: per son, pt was indep PTA        Hand Dominance   Dominant Hand: Left    Extremity/Trunk Assessment   Upper Extremity Assessment Upper Extremity Assessment: Defer to OT evaluation    Lower Extremity Assessment Lower Extremity Assessment: LLE deficits/detail LLE  Deficits / Details: grossly 4/5    Cervical / Trunk Assessment Cervical / Trunk Assessment: Normal  Communication   Communication: Prefers language other than English  Cognition Arousal/Alertness: Lethargic(verbal cues to  maintain eye opening during session) Behavior During Therapy: WFL for tasks assessed/performed Overall Cognitive Status: Within Functional Limits for tasks assessed                                 General Comments: per son, pt no longer confused but does have difficulty with vision      General Comments      Exercises     Assessment/Plan    PT Assessment Patient needs continued PT services  PT Problem List Decreased strength;Decreased range of motion;Decreased balance;Decreased activity tolerance;Decreased mobility;Decreased coordination;Decreased cognition;Decreased knowledge of use of DME;Decreased safety awareness;Decreased knowledge of precautions       PT Treatment Interventions DME instruction;Gait training;Stair training;Functional mobility training;Therapeutic activities;Therapeutic exercise;Balance training;Neuromuscular re-education;Cognitive remediation;Patient/family education    PT Goals (Current goals can be found in the Care Plan section)  Acute Rehab PT Goals Patient Stated Goal: i want to eat PT Goal Formulation: With patient/family Time For Goal Achievement: 05/23/17 Potential to Achieve Goals: Good    Frequency Min 4X/week   Barriers to discharge        Co-evaluation               AM-PAC PT "6 Clicks" Daily Activity  Outcome Measure Difficulty turning over in bed (including adjusting bedclothes, sheets and blankets)?: A Little Difficulty moving from lying on back to sitting on the side of the bed? : A Little Difficulty sitting down on and standing up from a chair with arms (e.g., wheelchair, bedside commode, etc,.)?: Unable Help needed moving to and from a bed to chair (including a wheelchair)?: A Little Help needed walking in hospital room?: A Little Help needed climbing 3-5 steps with a railing? : A Lot 6 Click Score: 15    End of Session Equipment Utilized During Treatment: Gait belt Activity Tolerance: Patient tolerated  treatment well Patient left: in chair;with call bell/phone within reach;with family/visitor present Nurse Communication: Mobility status PT Visit Diagnosis: Unsteadiness on feet (R26.81);Hemiplegia and hemiparesis Hemiplegia - Right/Left: Left Hemiplegia - dominant/non-dominant: Dominant Hemiplegia - caused by: Cerebral infarction    Time: [id number] PT Time Calculation (min) (ACUTE ONLY): 17 min   Charges:   PT Evaluation $PT Eval Moderate Complexity: 1 Mod     PT G Codes:        Lewis ShockAshly Jameyah Fennewald, PT, DPT Pager #: [phone] Office #: [phone]   Alyxis Grippi M Ramelo Oetken 05/09/2017, 2:17 PM

## 2017-05-09 NOTE — Care Management Note (Signed)
Case Management Note  Patient Details  Name: Meghan Ford MRN: [id number] Date of Birth: Feb 19, 1941  Subjective/Objective:                 [facility]. Referral place do Lupita LeashDonna.   Action/Plan:   Expected Discharge Date:  05/09/17               Expected Discharge Plan:  Home w Home Health Services  In-House Referral:     Discharge planning Services  CM Consult  Post Acute Care Choice:    Choice offered to:     DME Arranged:    DME Agency:     HH Arranged:  PT HH Agency:  [facility]  Status of Service:  Completed, signed off  If discussed at Long Length of Stay Meetings, dates discussed:    Additional Comments:  Lawerance SabalDebbie Avyukth Bontempo, RN 05/09/2017, 3:38 PM

## 2017-05-09 NOTE — Transfer of Care (Signed)
Immediate Anesthesia Transfer of Care Note  Patient: Meghan Ford  Procedure(s) Performed: TRANSESOPHAGEAL ECHOCARDIOGRAM (TEE) (N/A )  Patient Location: Endoscopy Unit  Anesthesia Type:MAC  Level of Consciousness: drowsy and patient cooperative  Airway & Oxygen Therapy: Patient Spontanous Breathing and Patient connected to nasal cannula oxygen  Post-op Assessment: Report given to RN, Post -op Vital signs reviewed and stable and Patient moving all extremities X 4  Post vital signs: Reviewed and stable  Last Vitals:  Vitals:   05/09/17 0845 05/09/17 0855  BP:  (!) 147/69  Pulse:  72  Resp:  17  Temp:    SpO2: 94% 97%    Last Pain:  Vitals:   05/09/17 0747  TempSrc: Oral         Complications: No apparent anesthesia complications

## 2017-05-09 NOTE — Discharge Summary (Signed)
Physician Discharge Summary  Meghan Ford FIE:[id number] DOB: 1940/10/25 DOA: 05/07/2017  PCP: Charlott Rakes, MD  Admit date: 05/07/2017 Discharge date: 05/09/2017  Admitted From: Home Disposition:  Home  Recommendations for Outpatient Follow-up:  1. Follow up with PCP in 1 week with repeat BMP and CBC 2. Follow-up with neurology in 4-6 weeks   Home Health: Yes Equipment/Devices: Loop recorder is being implanted today  Discharge Condition: Stable CODE STATUS: Full  Diet recommendation: Heart Healthy / Carb Modified / as per SLP recommendations  Brief/Interim Summary: 55 female with history of diabetes mellitus, hypertension, history of CVA presented to the ED for evaluation of left sided weakness and numbness with left facial droop and speech difficulty.  CT head was concerning for subacute infarct.  Neurology was consulted.  MRI confirmed subacute infarction.  Patient underwent TEE today which was unremarkable.  She is supposed to have loop recorder placement today.  Patient will be discharged after this procedure is done with outpatient follow-up with neurology.  She is tolerating diet.   Discharge Diagnoses:  Principal Problem:   Stroke [facility]) Active Problems:   Essential hypertension   Insulin dependent type 2 diabetes mellitus, uncontrolled ([place])   History of CVA (cerebrovascular accident)   Early subacute infarction involving the right PCA distribution CT head with extensive infarcts involving posterior right parietal and occipital lobes concerning for subacute watershed infarction MRI brain: Early subacute infarction involving the R PCA distribution and R posterior hemisphere watershed. Minimal petechial hemorrhage in right occipital lobe. Minimal local mass effect MRA head: Right proximal PCA occlusion with poor right PCA distribution collateralization CT angio head/neck: Severely diseased proximal Right PCA unchanged from previous. No significant carotid  artery stenosis, with mild mostly intra carotid atherosclerosis  TEE done today was unremarkable.  2D echo showed EF of 65-70% with grade 1 diastolic dysfunction Neurology  has cleared the patient for discharge on aspirin 325 mg daily, Plavix 75 mg daily and continue statin LDL 58 Patient is tolerating diet as per SLP.  PT recommends home PT. Patient will be discharged home today after loop recorder placement with home health   acute kidney injury Improving Improving with IV fluids.  Outpatient follow-up.  Keep losartan and hydrochlorothiazide on hold  Type 2 DM with hyperglycemia Uncontrolled Outpatient follow-up.  Continue outpatient Lantus.  Hypertension Blood pressure stable.  Continue amlodipine.  Continue nitrate.  Losartan and hydrochlorthiazide on hold.  Outpatient follow-up    Discharge Instructions  Discharge Instructions    Ambulatory referral to Neurology   Complete by:  As directed    An appointment is requested in approximately: 6 weeks Follow up with stroke clinic NP (Jessica Vanschaick or Cecille Rubin, if both not available, consider Zachery Dauer, or Ahern) at [facility] in about 4 weeks. Thanks.   Call MD for:  difficulty breathing, headache or visual disturbances   Complete by:  As directed    Call MD for:  extreme fatigue   Complete by:  As directed    Call MD for:  hives   Complete by:  As directed    Call MD for:  persistant dizziness or light-headedness   Complete by:  As directed    Call MD for:  persistant nausea and vomiting   Complete by:  As directed    Call MD for:  severe uncontrolled pain   Complete by:  As directed    Call MD for:  temperature >100.4   Complete by:  As directed    Diet -  low sodium heart healthy   Complete by:  As directed    Discharge instructions   Complete by:  As directed    Diet as per SLP recommendations   Increase activity slowly   Complete by:  As directed      Allergies as of 05/09/2017   No Known Allergies      Medication List    STOP taking these medications   losartan-hydrochlorothiazide 100-25 MG tablet Commonly known as:  HYZAAR     TAKE these medications   amLODipine 10 MG tablet Commonly known as:  NORVASC Take 10 mg by mouth daily.   aspirin 325 MG tablet Take 1 tablet (325 mg total) by mouth daily.   atorvastatin 80 MG tablet Commonly known as:  LIPITOR Take 1 tablet (80 mg total) by mouth daily.   blood glucose meter kit and supplies Kit Dispense based on patient and insurance preference. Use up to four times daily as directed. (FOR ICD-9 250.00, 250.01).   clopidogrel 75 MG tablet Commonly known as:  PLAVIX Take 1 tablet (75 mg total) by mouth daily.   gabapentin 300 MG capsule Commonly known as:  NEURONTIN Take 1 capsule (300 mg total) by mouth at bedtime.   glucose blood test strip Use as instructed   insulin glargine 100 UNIT/ML injection Commonly known as:  LANTUS Inject 0.35 mLs (35 Units total) into the skin 2 (two) times daily.   isosorbide mononitrate 60 MG 24 hr tablet Commonly known as:  IMDUR Take 1 tablet (60 mg total) by mouth daily.   senna-docusate 8.6-50 MG tablet Commonly known as:  Senokot-S Take 1 tablet by mouth at bedtime as needed for mild constipation.   TRUEPLUS LANCETS 28G Misc 1 each by Does not apply route 3 (three) times daily before meals.       Follow-up Information    Dennie Bible, NP. Schedule an appointment as soon as possible for a visit in 6 week(s).   Specialty:  Family Medicine Contact information: [address] [place] [address] [phone]        Charlott Rakes, MD. Schedule an appointment as soon as possible for a visit in 1 week(s).   Specialty:  Family Medicine Contact information: [place] [address] [phone]          No Known Allergies  Consultations: Neurology  Procedures/Studies: Ct Angio Head W Or Wo Contrast  Result Date:  05/08/2017 CLINICAL DATA:  77 year old female with right PCA and scattered posterior right MCA territory acute infarcts discovered following presentation of decreased left side vision and left side weakness for several days. Proximal right PCA occlusion on intracranial MRA. EXAM: CT ANGIOGRAPHY HEAD AND NECK TECHNIQUE: Multidetector CT imaging of the head and neck was performed using the standard protocol during bolus administration of intravenous contrast. Multiplanar CT image reconstructions and MIPs were obtained to evaluate the vascular anatomy. Carotid stenosis measurements (when applicable) are obtained utilizing NASCET criteria, using the distal internal carotid diameter as the denominator. CONTRAST:  70m ISOVUE-370 IOPAMIDOL (ISOVUE-370) INJECTION 76% COMPARISON:  Brain MRI and intracranial MRA 0051 hours today. Head CT without contrast 05/07/2017. brain MRI and intracranial MRA 11/07/2016 FINDINGS: CTA NECK Skeleton: Absent dentition. No acute osseous abnormality identified. Upper chest: Negative lung apices. No superior mediastinal lymphadenopathy. Other neck: Negative aside from a partially retropharyngeal course of both carotid arteries (normal variant). No neck mass or lymphadenopathy. Aortic arch: 3 vessel arch configuration. Moderate calcified plaque in the arch  but primarily distal to the great vessel origins. Right carotid system: Tortuous brachiocephalic artery with mild soft plaque. No associated stenosis. Tortuous proximal right CCA. No right CCA stenosis. Retropharyngeal right carotid bifurcation appears normal aside from mild tortuosity. No cervical right ICA stenosis. Left carotid system: No left CCA origin stenosis. Mildly tortuous left CCA. Minor soft plaque in the left CCA proximal to the bifurcation without stenosis. Widely patent left carotid bifurcation. Partially retropharyngeal right ECA. Tortuous but otherwise negative cervical left ICA. Vertebral arteries: Tortuous right subclavian  artery origin with a kinked appearance and mild calcified plaque but no significant stenosis. However, there is moderate to severe stenosis at the right vertebral artery origin related to calcified and soft plaque, best seen on series 9, image 82. The right vertebral otherwise is normal to the skull base. Tortuous proximal left subclavian artery with soft plaque and a kinked appearance but no significant stenosis. The left vertebral artery origin appears only mildly irregular without significant stenosis on series 8, image 148. Tortuous left V1 segment. The left vertebral artery is mildly non dominant but patent to the skull base without stenosis. CTA HEAD Posterior circulation: The distal right vertebral artery is mildly dominant and irregular throughout the V4 segment. No significant stenosis. The right PICA origin is patent. There is soft plaque or thrombus in the left vertebral artery V4 segment resulting in radiographic string sign stenosis or short segment occlusion of the vessel which is proximal to that which was visible on the intracranial MRA earlier today, but was partially demonstrated on the 2018 MRA. The appearance since that time has mildly progressed. See series 8, image 131. There is prominent calcified plaque at the distal aspect of this stenosis or occlusion. There is reconstituted flow in the distal left V4 segment, which appears to than functionally terminates in the left PICA origin which remains patent. The basilar artery is patent but diminutive and irregular, and appears stable since 2018. The SCA origins are patent. There is a fetal type left PCA origin with only a diminutive left P1 segment identified, stable since 2018. The proximal right PCA is chronically abnormal since August 2018, and it is unclear whether there was originally fetal type right PCA origin. Chronically there is only is a faint thread-like right P1 segment. Only diminutive right posterior communicating artery enhancement  is identified (such as on series 9, image 97. There is mild to moderate reconstituted flow in the right PCA P2 and distal branches. Overall the appearance of the proximal right PCA and P com has not significantly changed since 11/07/2016. The contralateral left PCA branches are patent with mild to moderate P3 segment irregularity and stenosis. Anterior circulation: Both ICA siphons are patent. There is mild bilateral calcified siphon plaque without stenosis. The bilateral ophthalmic artery origins and the left posterior communicating artery origin are normal. Patent carotid termini. There is mild stenosis at the left ACA origin. The right A1 is normal. The anterior communicating artery and bilateral proximal ACA branches are normal. There is mild irregularity in the distal ACA branches. The left MCA origin and M1 segment are patent without stenosis. The left MCA bifurcation is patent. There is only mild left MCA M3 branch irregularity. The right MCA M1 origin is normal. The mid right M1 segment is mildly irregular and stenotic as seen on series 10, image 20, and this is stable since 2018. The distal right M1 has a normal caliber. A right MCA trifurcation is patent. There is no right MCA branch  occlusion identified. The dominant posterior M2 branch demonstrates mild to moderate irregularity and stenosis as seen on series 12, image 16 which is stable since 2018. Venous sinuses: Patent on the delayed images. Anatomic variants: Mildly dominant right vertebral artery, and the distal left vertebral artery is diminutive beyond the left PICA. Fetal type left PCA origin. Questionable fetal type right PCA origin. Delayed phase: Right parietal and occipital lobe cytotoxic edema appears stable since yesterday. Small areas of cytotoxic edema in the posterior right frontal lobe also appears stable (series 13, image 20). Mild post ischemic enhancement in the more confluent areas of infarct. No acute intracranial hemorrhage  identified. No intracranial mass effect. Stable gray-white matter differentiation elsewhere. Review of the MIP images confirms the above findings IMPRESSION: 1. Severely diseased proximal Right PCA not significantly changed in appearance since an MRA in August 2018. It is unclear whether the vessel was originally a fetal type right PCA, but there is poor enhancement of diminutive Right Pcomm and Right P1 segments. There is mild to moderate reconstituted enhancement in the distal right PCA. 2. Superimposed chronic Right MCA M1 and dominant posterior Right M2 branch irregularity and stenosis, which appears atherosclerosis related and is also stable since the August 2018 MRA. 3. 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 4. 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 But there is reconstituted flow distally and the Left PICA origin remains patent. 5. Chronically diminutive and irregular Basilar Artery, stable since 2018. 6. No significant carotid artery stenosis, with mild mostly intra carotid atherosclerosis. 7. Stable CT appearance of the brain since yesterday. Electronically Signed   By: Genevie Ann M.D.   On: 05/08/2017 11:48   Ct Head Wo Contrast  Result Date: 05/07/2017 CLINICAL DATA:  Left facial droop with left sided weakness and numbness and decreased vision in the left eye for 3 days. Last seen normal 05/02/2017. Transient aphasia. EXAM: CT HEAD WITHOUT CONTRAST TECHNIQUE: Contiguous axial images were obtained from the base of the skull through the vertex without intravenous contrast. COMPARISON:  MRI brain 11/07/2016.  CT 11/06/2016. FINDINGS: Brain: Interval development of extensive encephalomalacia in the posterior right parietal and occipital lobes consistent with a late subacute cortical infarct. Patchy low-density in the posterior right  periventricular white matter corresponds with the areas of clustered restricted diffusion on previous MRI. No definite signs of acute stroke. There is no evidence of acute intracranial hemorrhage, mass effect, midline shift or hydrocephalus. Vascular: Intracranial vascular calcifications. No hyperdense vessel identified. Skull: Negative for fracture or focal lesion. Sinuses/Orbits: The visualized paranasal sinuses and mastoid air cells are clear. No orbital abnormalities are seen. Other: None. IMPRESSION: 1. 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 Watershed infarcts are also present in the deep periventricular white matter. 2. No definite signs of acute stroke or hemorrhage. Electronically Signed   By: Richardean Sale M.D.   On: 05/07/2017 18:27   Ct Angio Neck W Or Wo Contrast  Result Date: 05/08/2017 CLINICAL DATA:  77 year old female with right PCA and scattered posterior right MCA territory acute infarcts discovered following presentation of decreased left side vision and left side weakness for several days. Proximal right PCA occlusion on intracranial MRA. EXAM: CT ANGIOGRAPHY HEAD AND NECK TECHNIQUE: Multidetector CT  imaging of the head and neck was performed using the standard protocol during bolus administration of intravenous contrast. Multiplanar CT image reconstructions and MIPs were obtained to evaluate the vascular anatomy. Carotid stenosis measurements (when applicable) are obtained utilizing NASCET criteria, using the distal internal carotid diameter as the denominator. CONTRAST:  77m ISOVUE-370 IOPAMIDOL (ISOVUE-370) INJECTION 76% COMPARISON:  Brain MRI and intracranial MRA 0051 hours today. Head CT without contrast 05/07/2017. brain MRI and intracranial MRA 11/07/2016 FINDINGS: CTA NECK Skeleton: Absent dentition. No acute osseous  abnormality identified. Upper chest: Negative lung apices. No superior mediastinal lymphadenopathy. Other neck: Negative aside from a partially retropharyngeal course of both carotid arteries (normal variant). No neck mass or lymphadenopathy. Aortic arch: 3 vessel arch configuration. Moderate calcified plaque in the arch but primarily distal to the great vessel origins. Right carotid system: Tortuous brachiocephalic artery with mild soft plaque. No associated stenosis. Tortuous proximal right CCA. No right CCA stenosis. Retropharyngeal right carotid bifurcation appears normal aside from mild tortuosity. No cervical right ICA stenosis. Left carotid system: No left CCA origin stenosis. Mildly tortuous left CCA. Minor soft plaque in the left CCA proximal to the bifurcation without stenosis. Widely patent left carotid bifurcation. Partially retropharyngeal right ECA. Tortuous but otherwise negative cervical left ICA. Vertebral arteries: Tortuous right subclavian artery origin with a kinked appearance and mild calcified plaque but no significant stenosis. However, there is moderate to severe stenosis at the right vertebral artery origin related to calcified and soft plaque, best seen on series 9, image 82. The right vertebral otherwise is normal to the skull base. Tortuous proximal left subclavian artery with soft plaque and a kinked appearance but no significant stenosis. The left vertebral artery origin appears only mildly irregular without significant stenosis on series 8, image 148. Tortuous left V1 segment. The left vertebral artery is mildly non dominant but patent to the skull base without stenosis. CTA HEAD Posterior circulation: The distal right vertebral artery is mildly dominant and irregular throughout the V4 segment. No significant stenosis. The right PICA origin is patent. There is soft plaque or thrombus in the left vertebral artery V4 segment resulting in radiographic string sign stenosis or short segment  occlusion of the vessel which is proximal to that which was visible on the intracranial MRA earlier today, but was partially demonstrated on the 2018 MRA. The appearance since that time has mildly progressed. See series 8, image 131. There is prominent calcified plaque at the distal aspect of this stenosis or occlusion. There is reconstituted flow in the distal left V4 segment, which appears to than functionally terminates in the left PICA origin which remains patent. The basilar artery is patent but diminutive and irregular, and appears stable since 2018. The SCA origins are patent. There is a fetal type left PCA origin with only a diminutive left P1 segment identified, stable since 2018. The proximal right PCA is chronically abnormal since August 2018, and it is unclear whether there was originally fetal type right PCA origin. Chronically there is only is a faint thread-like right P1 segment. Only diminutive right posterior communicating artery enhancement is identified (such as on series 9, image 97. There is mild to moderate reconstituted flow in the right PCA P2 and distal branches. Overall the appearance of the proximal right PCA and P com has not significantly changed since 11/07/2016. The contralateral left PCA branches are patent with mild to moderate P3 segment irregularity and stenosis. Anterior circulation: Both ICA siphons are patent. There is mild bilateral calcified  siphon plaque without stenosis. The bilateral ophthalmic artery origins and the left posterior communicating artery origin are normal. Patent carotid termini. There is mild stenosis at the left ACA origin. The right A1 is normal. The anterior communicating artery and bilateral proximal ACA branches are normal. There is mild irregularity in the distal ACA branches. The left MCA origin and M1 segment are patent without stenosis. The left MCA bifurcation is patent. There is only mild left MCA M3 branch irregularity. The right MCA M1 origin is  normal. The mid right M1 segment is mildly irregular and stenotic as seen on series 10, image 20, and this is stable since 2018. The distal right M1 has a normal caliber. A right MCA trifurcation is patent. There is no right MCA branch occlusion identified. The dominant posterior M2 branch demonstrates mild to moderate irregularity and stenosis as seen on series 12, image 16 which is stable since 2018. Venous sinuses: Patent on the delayed images. Anatomic variants: Mildly dominant right vertebral artery, and the distal left vertebral artery is diminutive beyond the left PICA. Fetal type left PCA origin. Questionable fetal type right PCA origin. Delayed phase: Right parietal and occipital lobe cytotoxic edema appears stable since yesterday. Small areas of cytotoxic edema in the posterior right frontal lobe also appears stable (series 13, image 20). Mild post ischemic enhancement in the more confluent areas of infarct. No acute intracranial hemorrhage identified. No intracranial mass effect. Stable gray-white matter differentiation elsewhere. Review of the MIP images confirms the above findings IMPRESSION: 1. Severely diseased proximal Right PCA not significantly changed in appearance since an MRA in August 2018. It is unclear whether the vessel was originally a fetal type right PCA, but there is poor enhancement of diminutive Right Pcomm and Right P1 segments. There is mild to moderate reconstituted enhancement in the distal right PCA. 2. Superimposed chronic Right MCA M1 and dominant posterior Right M2 branch irregularity and stenosis, which appears atherosclerosis related and is also stable since the August 2018 MRA. 3. 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 4. 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 But there is reconstituted flow  distally and the Left PICA origin remains patent. 5. Chronically diminutive and irregular Basilar Artery, stable since 2018. 6. No significant carotid artery stenosis, with mild mostly intra carotid atherosclerosis. 7. Stable CT appearance of the brain since yesterday. Electronically Signed   By: Genevie Ann M.D.   On: 05/08/2017 11:48   Mr Brain Wo Contrast  Result Date: 05/08/2017 CLINICAL DATA:  77 y/o F; 3 days of left-sided weakness, numbness, left facial droop, and speech difficulty. EXAM: MRI HEAD WITHOUT CONTRAST MRA HEAD WITHOUT CONTRAST TECHNIQUE: Multiplanar, multiecho pulse sequences of the brain and surrounding structures were obtained without intravenous contrast. Angiographic images of the head were obtained using MRA technique without contrast. COMPARISON:  11/07/2016 MRI head and MRA head.  05/07/2016 CT head. FINDINGS: MRI HEAD FINDINGS Brain: Reduced diffusion within the right posterior parietal and occipital lobes with scattered small foci of reduced diffusion throughout the right superior parietal lobe and right posterior frontal lobe inclusive of precentral gyrus. Areas of infarction are associated with reduced diffusion and mild local mass effect compatible with early subacute infarction. Speckled foci of susceptibility hypointensity in right occipital lobe probably represent minimal petechial hemorrhage. Background of mild chronic microvascular ischemic changes and parenchymal volume loss of  the brain. No hydrocephalus, extra-axial collection, effacement of basilar cisterns, or additional focus of mass effect. Vascular: As below. Skull and upper cervical spine: Normal marrow signal. Sinuses/Orbits: Mild ethmoid and frontal sinus antrum mucosal thickening. Otherwise negative. Other: None. MRA HEAD FINDINGS Internal carotid arteries:  Patent. Anterior cerebral arteries:  Patent. Middle cerebral arteries: Patent. Mild right M1 stenosis and A1 stenosis. Multiple segments of mild stenosis and  bilateral M2 distributions. Anterior communicating artery: Patent. Posterior communicating arteries:  Patent.  Fetal left PCA. Posterior cerebral arteries: Right proximal PCA occlusion with poor distal PCA collateralization. Patent left proximal PCA and downstream circulation. Basilar artery: Small caliber basilar artery likely due to left fetal and prominent right PCA circulation. Mild irregularity of the basilar artery with mild-to-moderate distal stenosis. Vertebral arteries: Patent.) Right proximal PICA moderate stenosis. No aneurysm identified. IMPRESSION: MRI head: Early subacute infarction involving the right PCA distribution and right posterior hemisphere watershed. Minimal petechial hemorrhage in right occipital lobe. Minimal local mass effect. MRA head: 1. Right proximal PCA occlusion with poor right PCA distribution collateralization. 2. Intracranial atherosclerosis with multiple segments of mild-to-moderate stenosis in the anterior and posterior circulation. These results will be called to the ordering clinician or representative by the Radiologist Assistant, and communication documented in the PACS or zVision Dashboard. Electronically Signed   By: Kristine Garbe M.D.   On: 05/08/2017 01:32   Mr Jodene Nam Head Wo Contrast  Result Date: 05/08/2017 CLINICAL DATA:  77 y/o F; 3 days of left-sided weakness, numbness, left facial droop, and speech difficulty. EXAM: MRI HEAD WITHOUT CONTRAST MRA HEAD WITHOUT CONTRAST TECHNIQUE: Multiplanar, multiecho pulse sequences of the brain and surrounding structures were obtained without intravenous contrast. Angiographic images of the head were obtained using MRA technique without contrast. COMPARISON:  11/07/2016 MRI head and MRA head.  05/07/2016 CT head. FINDINGS: MRI HEAD FINDINGS Brain: Reduced diffusion within the right posterior parietal and occipital lobes with scattered small foci of reduced diffusion throughout the right superior parietal lobe and right  posterior frontal lobe inclusive of precentral gyrus. Areas of infarction are associated with reduced diffusion and mild local mass effect compatible with early subacute infarction. Speckled foci of susceptibility hypointensity in right occipital lobe probably represent minimal petechial hemorrhage. Background of mild chronic microvascular ischemic changes and parenchymal volume loss of the brain. No hydrocephalus, extra-axial collection, effacement of basilar cisterns, or additional focus of mass effect. Vascular: As below. Skull and upper cervical spine: Normal marrow signal. Sinuses/Orbits: Mild ethmoid and frontal sinus antrum mucosal thickening. Otherwise negative. Other: None. MRA HEAD FINDINGS Internal carotid arteries:  Patent. Anterior cerebral arteries:  Patent. Middle cerebral arteries: Patent. Mild right M1 stenosis and A1 stenosis. Multiple segments of mild stenosis and bilateral M2 distributions. Anterior communicating artery: Patent. Posterior communicating arteries:  Patent.  Fetal left PCA. Posterior cerebral arteries: Right proximal PCA occlusion with poor distal PCA collateralization. Patent left proximal PCA and downstream circulation. Basilar artery: Small caliber basilar artery likely due to left fetal and prominent right PCA circulation. Mild irregularity of the basilar artery with mild-to-moderate distal stenosis. Vertebral arteries: Patent.) Right proximal PICA moderate stenosis. No aneurysm identified. IMPRESSION: MRI head: Early subacute infarction involving the right PCA distribution and right posterior hemisphere watershed. Minimal petechial hemorrhage in right occipital lobe. Minimal local mass effect. MRA head: 1. Right proximal PCA occlusion with poor right PCA distribution collateralization. 2. Intracranial atherosclerosis with multiple segments of mild-to-moderate stenosis in the anterior and posterior circulation. These results will be called to the  ordering clinician or  representative by the Radiologist Assistant, and communication documented in the PACS or zVision Dashboard. Electronically Signed   By: Kristine Garbe M.D.   On: 05/08/2017 01:32    TEE on 05/09/2017:  Findings:  - Normal EF  - Trace MR/TR  - No LAA thrombus  - Negative bubble study  No embolic source.   Echo on 05/08/2017 Study Conclusions  - Left ventricle: The cavity size was normal. Systolic function was   vigorous. The estimated ejection fraction was in the range of 65%   to 70%. Although no diagnostic regional wall motion abnormality   was identified, this possibility cannot be completely excluded on   the basis of this study. Doppler parameters are consistent with   abnormal left ventricular relaxation (grade 1 diastolic   dysfunction). - Aortic valve: There was trivial regurgitation. - Mitral valve: Mildly calcified annulus. - Pulmonary arteries: Systolic pressure was mildly increased. PA   peak pressure: 38 mm Hg (S).    Subjective: Patient seen and examined at bedside.  Son is present at bedside who interpreted for me.  No overnight fever, nausea or vomiting.  No cough or shortness of breath or chest pain.  Discharge Exam: Vitals:   05/09/17 1000 05/09/17 1205  BP: (!) 128/50 125/61  Pulse: 70 70  Resp: 17 18  Temp:  98.6 F (37 C)  SpO2: 95% 97%   Vitals:   05/09/17 0940 05/09/17 0950 05/09/17 1000 05/09/17 1205  BP: 123/60 (!) 136/58 (!) 128/50 125/61  Pulse: 71 71 70 70  Resp: (!) 25 (!) '22 17 18  '$ Temp:    98.6 F (37 C)  TempSrc:    Oral  SpO2: 96% 94% 95% 97%  Weight:        General: Pt is alert, awake, not in acute distress Cardiovascular: Rate controlled, S1/S2 + Respiratory: Bilateral decreased breath sounds at bases Abdominal: Soft, NT, ND, bowel sounds + Extremities: no edema, no cyanosis    The results of significant diagnostics from this hospitalization (including imaging, microbiology, ancillary and laboratory) are  listed below for reference.     Microbiology: No results found for this or any previous visit (from the past 240 hour(s)).   Labs: BNP (last 3 results) No results for input(s): BNP in the last 8760 hours. Basic Metabolic Panel: Recent Labs  Lab 05/07/17 1545 05/07/17 1618 05/08/17 0503 05/09/17 0744  NA 134* 135 137 140  K 4.2 4.2 4.2 4.0  CL 96* 97* 100* 104  CO2 22  --  22 25  GLUCOSE 376* 374* 229* 131*  BUN 51* 44* 45* 34*  CREATININE 2.12* 1.90* 1.75* 1.51*  CALCIUM 8.9  --  9.0 8.6*   Liver Function Tests: Recent Labs  Lab 05/07/17 1545  AST 21  ALT 18  ALKPHOS 92  BILITOT 0.6  PROT 7.2  ALBUMIN 3.0*   No results for input(s): LIPASE, AMYLASE in the last 168 hours. No results for input(s): AMMONIA in the last 168 hours. CBC: Recent Labs  Lab 05/07/17 1545 05/07/17 1618 05/09/17 0744  WBC 10.2  --  10.1  NEUTROABS 7.5  --  6.3  HGB 13.4 15.0 12.1  HCT 41.9 44.0 38.9  MCV 80.3  --  80.2  PLT 272  --  253   Cardiac Enzymes: No results for input(s): CKTOTAL, CKMB, CKMBINDEX, TROPONINI in the last 168 hours. BNP: Invalid input(s): POCBNP CBG: Recent Labs  Lab 05/08/17 1934 05/09/17 0008 05/09/17 0424 05/09/17 0745  05/09/17 1207  GLUCAP 246* 147* 144* 121* 169*   D-Dimer No results for input(s): DDIMER in the last 72 hours. Hgb A1c Recent Labs    05/08/17 0503  HGBA1C 12.7*   Lipid Profile Recent Labs    05/08/17 0503  CHOL 122  HDL 28*  LDLCALC 58  TRIG 179*  CHOLHDL 4.4   Thyroid function studies No results for input(s): TSH, T4TOTAL, T3FREE, THYROIDAB in the last 72 hours.  Invalid input(s): FREET3 Anemia work up No results for input(s): VITAMINB12, FOLATE, FERRITIN, TIBC, IRON, RETICCTPCT in the last 72 hours. Urinalysis    Component Value Date/Time   COLORURINE YELLOW 10/27/2014 2321   APPEARANCEUR CLEAR 10/27/2014 2321   LABSPEC 1.012 10/27/2014 2321   PHURINE 7.0 10/27/2014 2321   GLUCOSEU NEGATIVE 10/27/2014 2321    HGBUR NEGATIVE 10/27/2014 2321   BILIRUBINUR negative 08/25/2015 1515   KETONESUR NEGATIVE 10/27/2014 2321   PROTEINUR 100 08/25/2015 1515   PROTEINUR NEGATIVE 10/27/2014 2321   UROBILINOGEN 1.0 08/25/2015 1515   UROBILINOGEN 0.2 10/27/2014 2321   NITRITE negative 08/25/2015 1515   NITRITE NEGATIVE 10/27/2014 2321   LEUKOCYTESUR Negative 08/25/2015 1515   Sepsis Labs Invalid input(s): PROCALCITONIN,  WBC,  LACTICIDVEN Microbiology No results found for this or any previous visit (from the past 240 hour(s)).   Time coordinating discharge: 35  minutes  SIGNED:   Aline August, MD  [facility] 05/09/2017, 2:29 PM Pager: [phone]  If 7PM-7AM, please contact night-coverage [email or web address] Password [email or web address]

## 2017-05-09 NOTE — Progress Notes (Signed)
RN spoke to Cardiology EP and pt's will have her loop place today. Per Attending MD, pt will be discharge after loop recorder placement. Family updated.   Sim BoastHavy, RN

## 2017-05-09 NOTE — Interval H&P Note (Signed)
History and Physical Interval Note:  05/09/2017 8:52 AM  Meghan Ford  has presented today for surgery, with the diagnosis of STROKE  The various methods of treatment have been discussed with the patient and family. After consideration of risks, benefits and other options for treatment, the patient has consented to  Procedure(s): TRANSESOPHAGEAL ECHOCARDIOGRAM (TEE) (N/A) as a surgical intervention .  The patient's history has been reviewed, patient examined, no change in status, stable for surgery.  I have reviewed the patient's chart and labs.  Questions were answered to the patient's satisfaction.     Coca ColaMark Kasidi Shanker

## 2017-05-09 NOTE — Evaluation (Signed)
Speech Language Pathology Evaluation Patient Details Name: Meghan Ford MRN: [id number] DOB: 1940-06-27 Today's Date: 05/09/2017 Time: 4259-5638 SLP Time Calculation (min) (ACUTE ONLY): 25 min  Problem List:  Patient Active Problem List   Diagnosis Date Noted  . History of CVA (cerebrovascular accident) 05/07/2017  . Stroke (HCC) 05/07/2017  . Intracranial vascular stenosis   . TIA (transient ischemic attack) 11/07/2016  . Insulin dependent type 2 diabetes mellitus, uncontrolled (HCC) 11/07/2016  . HLD (hyperlipidemia) 11/07/2016  . HTN (hypertension) 11/07/2016  . 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 Noncompliance with diet and medication regimen 08/26/2015  . S/p reverse total shoulder arthroplasty   . Leukocytosis   . Proximal humerus fracture 10/26/2014  . Hyperlipidemia 10/23/2014  . Hypokalemia 10/09/2014  . Essential hypertension 10/09/2014  . Protein-calorie malnutrition, severe (HCC) 09/29/2014  . Sepsis (HCC) 09/28/2014  . Fever 09/28/2014  . Headache 09/28/2014  . Diabetes mellitus with neuropathy (HCC)   . Pyrexia   . Uncontrolled hypertension 10/15/2012  . Neuropathic pain of both legs 10/15/2012  . Palpitations 05/21/2012  . Dyslipidemia 05/15/2012  . Hypertension   . Hyperlipidemia LDL goal < 100   . Diabetes mellitus type II, uncontrolled (HCC)   . Varicose veins of lower extremities with other complications 11/07/2011   Past Medical History:  Past Medical History:  Diagnosis Date  . Diabetes mellitus type II, uncontrolled (HCC)   . Diabetes mellitus without complication (HCC)   . Fx humeral neck   . Hyperlipidemia LDL goal < 100   . Hypertension   . Leg pain    worse with prolonged standing  . Peripheral vascular disease (HCC)   . Stroke [facility]) 2018; 05/08/2017   denies residual from 2018 stroke on 05/08/2017; weakness on left; speech issues from today's stroke (05/08/2017)  . Varicose  veins    Past Surgical History:  Past Surgical History:  Procedure Laterality Date  . FRACTURE SURGERY    . REVERSE SHOULDER ARTHROPLASTY Left 10/27/2014   Procedure: REVERSE SHOULDER ARTHROPLASTY;  Surgeon: Sheral Apley, MD;  Location: [facility] OR;  Service: Orthopedics;  Laterality: Left;  . TOTAL SHOULDER ARTHROPLASTY Left 10/27/2014   Procedure: TOTAL SHOULDER ARTHROPLASTY;  Surgeon: Sheral Apley, MD;  Location: [facility] OR;  Service: Orthopedics;  Laterality: Left;  Marland Kitchen VEIN SURGERY Left    left leg   HPI:  [name] a 77 y.o.femalewith PMH of HTN, HLD, DM, and PVD with 2-3 day history of left sided weakness. MRI reveals: Early/Subacute infarct Right PCA Infarct and Right posterior hemisphere watershed. Pt has been observed to have left visual field cut, right gaze preference and mild weakness of LUE and LLE   Assessment / Plan / Recommendation Clinical Impression  Pt demonstrates left visual perceptual deficits making cueing needsed for functional tasks in center left visual field. Pt also observed to have working and short term memory deficits that would increase dependence on family member for pts ADLs. Recommend f/u with SLP at next level of care for therapies targeting awareness and compensatory strategies for memory.     SLP Assessment  SLP Recommendation/Assessment: Patient needs continued Speech Lanaguage Pathology Services SLP Visit Diagnosis: Frontal lobe and executive function deficit Frontal lobe and executive function deficit following: Cerebral infarction    Follow Up Recommendations  Home health SLP    Frequency and Duration           SLP Evaluation Cognition  Overall Cognitive Status: Impaired/Different  from baseline Arousal/Alertness: Awake/alert Orientation Level: Oriented X4 Attention: Selective Selective Attention: Impaired Selective Attention Impairment: Verbal complex Memory: Impaired Memory Impairment: Storage deficit;Retrieval  deficit Awareness: Impaired Awareness Impairment: Emergent impairment       Comprehension  Auditory Comprehension Overall Auditory Comprehension: Appears within functional limits for tasks assessed    Expression Verbal Expression Overall Verbal Expression: Appears within functional limits for tasks assessed Written Expression Dominant Hand: Left   Oral / Motor  Motor Speech Overall Motor Speech: Appears within functional limits for tasks assessed   GO                   Meghan Milillo, MA CCC-Meghan DittySLP [phone]  Meghan Ford, Meghan Ford Meghan Ford 05/09/2017, 2:43 PM

## 2017-05-09 NOTE — Progress Notes (Addendum)
NEUROHOSPITALISTS STROKE TEAM - DAILY PROGRESS NOTE   SUBJECTIVE (INTERVAL HISTORY)  Son is at the bedside. Patient is found sitting in chair in NAD. Overall she feels her condition is slowly improving. Voices no new complaints. No new events reported overnight. Son states his mothers left sided weakness is improving. Case Management to assist with home medications   OBJECTIVE Lab Results: CBC:  Recent Labs  Lab 05/07/17 1545 05/07/17 1618  WBC 10.2  --   HGB 13.4 15.0  HCT 41.9 44.0  MCV 80.3  --   PLT 272  --    BMP: Recent Labs  Lab 05/07/17 1545 05/07/17 1618 05/08/17 0503 05/09/17 0744  NA 134* 135 137 140  K 4.2 4.2 4.2 4.0  CL 96* 97* 100* 104  CO2 22  --  22 25  GLUCOSE 376* 374* 229* 131*  BUN 51* 44* 45* 34*  CREATININE 2.12* 1.90* 1.75* 1.51*  CALCIUM 8.9  --  9.0 8.6*   Liver Function Tests:  Recent Labs  Lab 05/07/17 1545  AST 21  ALT 18  ALKPHOS 92  BILITOT 0.6  PROT 7.2  ALBUMIN 3.0*   Coagulation Studies:  Recent Labs    05/07/17 1545 05/09/17 0744  APTT 31  --   INR 1.02 1.04   Urine Drug Screen:     Component Value Date/Time   LABOPIA NONE DETECTED 05/08/2017 1948   COCAINSCRNUR NONE DETECTED 05/08/2017 1948   LABBENZ NONE DETECTED 05/08/2017 1948   AMPHETMU NONE DETECTED 05/08/2017 1948   THCU NONE DETECTED 05/08/2017 1948   LABBARB NONE DETECTED 05/08/2017 1948    Alcohol Level: No results for input(s): ETH in the last 168 hours.  PHYSICAL EXAM Temp:  [98.6 F (37 C)-99.3 F (37.4 C)] 98.6 F (37 C) (02/27 1205) Pulse Rate:  [70-76] 70 (02/27 1205) Resp:  [17-25] 18 (02/27 1205) BP: (113-147)/(50-69) 125/61 (02/27 1205) SpO2:  [90 %-97 %] 97 % (02/27 1205) General - Well nourished, well developed, in no apparent distress HEENT-  Normocephalic,  Cardiovascular - Regular rate and rhythm  Respiratory - Lungs clear bilaterally. No wheezing. Abdomen - soft and  non-tender, BS normal Extremities- no edema or cyanosis Mental Status: Remains drowsy. Able to answer simple questions and follow basic motor commands in English as well as in [place]. Spanish is her primary language.  Cranial Nerves: II:  Left visual field cut noted. PERRL.  III,IV, VI: Unable to cross more than 2 mm past midline with conjugate gaze to left. No nystagmus.  V,VII: No definite left facial droop. States temp sensation equal bilaterally VIII: hearing intact to voice IX,X: Mild hypophonia XI: Symmetric XII: midline tongue extension without atrophy or fasciculations Motor: RUE 5/5 LUE subtle deficit rated at 4+/5  RLE: 5/5 LLE: 4+/5 Sensory: Reacts to FT bilateral upper and lower extremities. Subjectively equal sensation to cold bilateral proximal lower and upper extremities Deep Tendon Reflexes:  2+ biceps, brachioradialis and patellae bilaterally 0 achilles bilaterally Plantars: Right: Equivocal  Left: Upgoing Cerebellar: Slow FNF on right without ataxia. Able to perform slowly on left.  Gait: Deferred due to falls risk concerns  IMAGING: I have personally reviewed the radiological images below and agree with the radiology interpretations. Ct Angio Head W Or Wo Contrast  Result Date: 05/08/2017 CLINICAL DATA:  77 year old female with right PCA and scattered posterior right MCA territory acute infarcts discovered following presentation of decreased left side vision and left side weakness for several days. Proximal right PCA occlusion on  intracranial MRA. EXAM: CT ANGIOGRAPHY HEAD AND NECK TECHNIQUE: Multidetector CT imaging of the head and neck was performed using the standard protocol during bolus administration of intravenous contrast. Multiplanar CT image reconstructions and MIPs were obtained to evaluate the vascular anatomy. Carotid stenosis measurements (when applicable) are obtained utilizing NASCET criteria, using the distal internal carotid diameter as the  denominator. CONTRAST:  50mL ISOVUE-370 IOPAMIDOL (ISOVUE-370) INJECTION 76% COMPARISON:  Brain MRI and intracranial MRA 0051 hours today. Head CT without contrast 05/07/2017. brain MRI and intracranial MRA 11/07/2016 FINDINGS: CTA NECK Skeleton: Absent dentition. No acute osseous abnormality identified. Upper chest: Negative lung apices. No superior mediastinal lymphadenopathy. Other neck: Negative aside from a partially retropharyngeal course of both carotid arteries (normal variant). No neck mass or lymphadenopathy. Aortic arch: 3 vessel arch configuration. Moderate calcified plaque in the arch but primarily distal to the great vessel origins. Right carotid system: Tortuous brachiocephalic artery with mild soft plaque. No associated stenosis. Tortuous proximal right CCA. No right CCA stenosis. Retropharyngeal right carotid bifurcation appears normal aside from mild tortuosity. No cervical right ICA stenosis. Left carotid system: No left CCA origin stenosis. Mildly tortuous left CCA. Minor soft plaque in the left CCA proximal to the bifurcation without stenosis. Widely patent left carotid bifurcation. Partially retropharyngeal right ECA. Tortuous but otherwise negative cervical left ICA. Vertebral arteries: Tortuous right subclavian artery origin with a kinked appearance and mild calcified plaque but no significant stenosis. However, there is moderate to severe stenosis at the right vertebral artery origin related to calcified and soft plaque, best seen on series 9, image 82. The right vertebral otherwise is normal to the skull base. Tortuous proximal left subclavian artery with soft plaque and a kinked appearance but no significant stenosis. The left vertebral artery origin appears only mildly irregular without significant stenosis on series 8, image 148. Tortuous left V1 segment. The left vertebral artery is mildly non dominant but patent to the skull base without stenosis. CTA HEAD Posterior circulation: The  distal right vertebral artery is mildly dominant and irregular throughout the V4 segment. No significant stenosis. The right PICA origin is patent. There is soft plaque or thrombus in the left vertebral artery V4 segment resulting in radiographic string sign stenosis or short segment occlusion of the vessel which is proximal to that which was visible on the intracranial MRA earlier today, but was partially demonstrated on the 2018 MRA. The appearance since that time has mildly progressed. See series 8, image 131. There is prominent calcified plaque at the distal aspect of this stenosis or occlusion. There is reconstituted flow in the distal left V4 segment, which appears to than functionally terminates in the left PICA origin which remains patent. The basilar artery is patent but diminutive and irregular, and appears stable since 2018. The SCA origins are patent. There is a fetal type left PCA origin with only a diminutive left P1 segment identified, stable since 2018. The proximal right PCA is chronically abnormal since August 2018, and it is unclear whether there was originally fetal type right PCA origin. Chronically there is only is a faint thread-like right P1 segment. Only diminutive right posterior communicating artery enhancement is identified (such as on series 9, image 97. There is mild to moderate reconstituted flow in the right PCA P2 and distal branches. Overall the appearance of the proximal right PCA and P com has not significantly changed since 11/07/2016. The contralateral left PCA branches are patent with mild to moderate P3 segment irregularity and stenosis. Anterior  circulation: Both ICA siphons are patent. There is mild bilateral calcified siphon plaque without stenosis. The bilateral ophthalmic artery origins and the left posterior communicating artery origin are normal. Patent carotid termini. There is mild stenosis at the left ACA origin. The right A1 is normal. The anterior communicating  artery and bilateral proximal ACA branches are normal. There is mild irregularity in the distal ACA branches. The left MCA origin and M1 segment are patent without stenosis. The left MCA bifurcation is patent. There is only mild left MCA M3 branch irregularity. The right MCA M1 origin is normal. The mid right M1 segment is mildly irregular and stenotic as seen on series 10, image 20, and this is stable since 2018. The distal right M1 has a normal caliber. A right MCA trifurcation is patent. There is no right MCA branch occlusion identified. The dominant posterior M2 branch demonstrates mild to moderate irregularity and stenosis as seen on series 12, image 16 which is stable since 2018. Venous sinuses: Patent on the delayed images. Anatomic variants: Mildly dominant right vertebral artery, and the distal left vertebral artery is diminutive beyond the left PICA. Fetal type left PCA origin. Questionable fetal type right PCA origin. Delayed phase: Right parietal and occipital lobe cytotoxic edema appears stable since yesterday. Small areas of cytotoxic edema in the posterior right frontal lobe also appears stable (series 13, image 20). Mild post ischemic enhancement in the more confluent areas of infarct. No acute intracranial hemorrhage identified. No intracranial mass effect. Stable gray-white matter differentiation elsewhere. Review of the MIP images confirms the above findings IMPRESSION: 1. Severely diseased proximal Right PCA not significantly changed in appearance since an MRA in August 2018. It is unclear whether the vessel was originally a fetal type right PCA, but there is poor enhancement of diminutive Right Pcomm and Right P1 segments. There is mild to moderate reconstituted enhancement in the distal right PCA. 2. Superimposed chronic Right MCA M1 and dominant posterior Right M2 branch irregularity and stenosis, which appears atherosclerosis related and is also stable since the August 2018 MRA. 3. 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 4. 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 But there is reconstituted flow distally and the Left PICA origin remains patent. 5. Chronically diminutive and irregular Basilar Artery, stable since 2018. 6. No significant carotid artery stenosis, with mild mostly intra carotid atherosclerosis. 7. Stable CT appearance of the brain since yesterday. Electronically Signed   By: Odessa Fleming M.D.   On: 05/08/2017 11:48   Ct Head Wo Contrast  Result Date: 05/07/2017 CLINICAL DATA:  Left facial droop with left sided weakness and numbness and decreased vision in the left eye for 3 days. Last seen normal 05/02/2017. Transient aphasia. EXAM: CT HEAD WITHOUT CONTRAST TECHNIQUE: Contiguous axial images were obtained from the base of the skull through the vertex without intravenous contrast. COMPARISON:  MRI brain 11/07/2016.  CT 11/06/2016. FINDINGS: Brain: Interval development of extensive encephalomalacia in the posterior right parietal and occipital lobes consistent with a late subacute cortical infarct. Patchy low-density in the posterior right periventricular white matter corresponds with the areas of clustered restricted diffusion on previous MRI. No definite signs of acute stroke. There is no evidence of acute intracranial hemorrhage, mass effect, midline shift or hydrocephalus. Vascular: Intracranial vascular calcifications. No hyperdense vessel identified. Skull: Negative for fracture or focal lesion. Sinuses/Orbits:  The visualized paranasal sinuses and mastoid air cells are clear. No orbital abnormalities are seen. Other: None. IMPRESSION: 1. 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 Watershed infarcts are also present in the deep periventricular white matter. 2. No definite signs of acute stroke or hemorrhage. Electronically Signed   By: Carey Bullocks M.D.   On: 05/07/2017 18:27   Ct Angio Neck W Or Wo Contrast  Result Date: 05/08/2017 CLINICAL DATA:  77 year old female with right PCA and scattered posterior right MCA territory acute infarcts discovered following presentation of decreased left side vision and left side weakness for several days. Proximal right PCA occlusion on intracranial MRA. EXAM: CT ANGIOGRAPHY HEAD AND NECK TECHNIQUE: Multidetector CT imaging of the head and neck was performed using the standard protocol during bolus administration of intravenous contrast. Multiplanar CT image reconstructions and MIPs were obtained to evaluate the vascular anatomy. Carotid stenosis measurements (when applicable) are obtained utilizing NASCET criteria, using the distal internal carotid diameter as the denominator. CONTRAST:  50mL ISOVUE-370 IOPAMIDOL (ISOVUE-370) INJECTION 76% COMPARISON:  Brain MRI and intracranial MRA 0051 hours today. Head CT without contrast 05/07/2017. brain MRI and intracranial MRA 11/07/2016 FINDINGS: CTA NECK Skeleton: Absent dentition. No acute osseous abnormality identified. Upper chest: Negative lung apices. No superior mediastinal lymphadenopathy. Other neck: Negative aside from a partially retropharyngeal course of both carotid arteries (normal variant). No neck mass or lymphadenopathy. Aortic arch: 3 vessel arch configuration. Moderate calcified plaque in the arch but primarily distal to the great vessel origins. Right carotid system: Tortuous brachiocephalic artery with mild soft plaque. No associated stenosis. Tortuous proximal right CCA. No right CCA stenosis. Retropharyngeal right carotid bifurcation appears normal aside from mild tortuosity. No cervical right ICA stenosis. Left carotid system: No left CCA origin stenosis.  Mildly tortuous left CCA. Minor soft plaque in the left CCA proximal to the bifurcation without stenosis. Widely patent left carotid bifurcation. Partially retropharyngeal right ECA. Tortuous but otherwise negative cervical left ICA. Vertebral arteries: Tortuous right subclavian artery origin with a kinked appearance and mild calcified plaque but no significant stenosis. However, there is moderate to severe stenosis at the right vertebral artery origin related to calcified and soft plaque, best seen on series 9, image 82. The right vertebral otherwise is normal to the skull base. Tortuous proximal left subclavian artery with soft plaque and a kinked appearance but no significant stenosis. The left vertebral artery origin appears only mildly irregular without significant stenosis on series 8, image 148. Tortuous left V1 segment. The left vertebral artery is mildly non dominant but patent to the skull base without stenosis. CTA HEAD Posterior circulation: The distal right vertebral artery is mildly dominant and irregular throughout the V4 segment. No significant stenosis. The right PICA origin is patent. There is soft plaque or thrombus in the left vertebral artery V4 segment resulting in radiographic string sign stenosis or short segment occlusion of the vessel which is proximal to that which was visible on the intracranial MRA earlier today, but was partially demonstrated on the 2018 MRA. The appearance since that time has mildly progressed. See series 8, image 131. There is prominent calcified plaque at the distal aspect of this stenosis or occlusion. There is reconstituted flow in the distal left V4 segment, which appears to than functionally terminates in the left PICA origin which remains patent. The basilar artery is patent but  diminutive and irregular, and appears stable since 2018. The SCA origins are patent. There is a fetal type left PCA origin with only a diminutive left P1 segment identified, stable since  2018. The proximal right PCA is chronically abnormal since August 2018, and it is unclear whether there was originally fetal type right PCA origin. Chronically there is only is a faint thread-like right P1 segment. Only diminutive right posterior communicating artery enhancement is identified (such as on series 9, image 97. There is mild to moderate reconstituted flow in the right PCA P2 and distal branches. Overall the appearance of the proximal right PCA and P com has not significantly changed since 11/07/2016. The contralateral left PCA branches are patent with mild to moderate P3 segment irregularity and stenosis. Anterior circulation: Both ICA siphons are patent. There is mild bilateral calcified siphon plaque without stenosis. The bilateral ophthalmic artery origins and the left posterior communicating artery origin are normal. Patent carotid termini. There is mild stenosis at the left ACA origin. The right A1 is normal. The anterior communicating artery and bilateral proximal ACA branches are normal. There is mild irregularity in the distal ACA branches. The left MCA origin and M1 segment are patent without stenosis. The left MCA bifurcation is patent. There is only mild left MCA M3 branch irregularity. The right MCA M1 origin is normal. The mid right M1 segment is mildly irregular and stenotic as seen on series 10, image 20, and this is stable since 2018. The distal right M1 has a normal caliber. A right MCA trifurcation is patent. There is no right MCA branch occlusion identified. The dominant posterior M2 branch demonstrates mild to moderate irregularity and stenosis as seen on series 12, image 16 which is stable since 2018. Venous sinuses: Patent on the delayed images. Anatomic variants: Mildly dominant right vertebral artery, and the distal left vertebral artery is diminutive beyond the left PICA. Fetal type left PCA origin. Questionable fetal type right PCA origin. Delayed phase: Right parietal and  occipital lobe cytotoxic edema appears stable since yesterday. Small areas of cytotoxic edema in the posterior right frontal lobe also appears stable (series 13, image 20). Mild post ischemic enhancement in the more confluent areas of infarct. No acute intracranial hemorrhage identified. No intracranial mass effect. Stable gray-white matter differentiation elsewhere. Review of the MIP images confirms the above findings IMPRESSION: 1. Severely diseased proximal Right PCA not significantly changed in appearance since an MRA in August 2018. It is unclear whether the vessel was originally a fetal type right PCA, but there is poor enhancement of diminutive Right Pcomm and Right P1 segments. There is mild to moderate reconstituted enhancement in the distal right PCA. 2. Superimposed chronic Right MCA M1 and dominant posterior Right M2 branch irregularity and stenosis, which appears atherosclerosis related and is also stable since the August 2018 MRA. 3. 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 4. 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 But there is reconstituted flow distally and the Left PICA origin remains patent. 5. Chronically diminutive and irregular Basilar Artery, stable since 2018. 6. No significant carotid artery stenosis, with mild mostly intra carotid atherosclerosis. 7. Stable CT appearance of the brain since yesterday. Electronically Signed   By: Odessa Fleming M.D.   On: 05/08/2017 11:48   Mr Brain Wo Contrast  Result Date: 05/08/2017 CLINICAL DATA:  77 y/o F;  3 days of left-sided weakness, numbness, left facial droop, and speech difficulty. EXAM: MRI HEAD WITHOUT CONTRAST MRA HEAD WITHOUT CONTRAST TECHNIQUE: Multiplanar, multiecho pulse sequences of the brain and surrounding structures were obtained without intravenous contrast.  Angiographic images of the head were obtained using MRA technique without contrast. COMPARISON:  11/07/2016 MRI head and MRA head.  05/07/2016 CT head. FINDINGS: MRI HEAD FINDINGS Brain: Reduced diffusion within the right posterior parietal and occipital lobes with scattered small foci of reduced diffusion throughout the right superior parietal lobe and right posterior frontal lobe inclusive of precentral gyrus. Areas of infarction are associated with reduced diffusion and mild local mass effect compatible with early subacute infarction. Speckled foci of susceptibility hypointensity in right occipital lobe probably represent minimal petechial hemorrhage. Background of mild chronic microvascular ischemic changes and parenchymal volume loss of the brain. No hydrocephalus, extra-axial collection, effacement of basilar cisterns, or additional focus of mass effect. Vascular: As below. Skull and upper cervical spine: Normal marrow signal. Sinuses/Orbits: Mild ethmoid and frontal sinus antrum mucosal thickening. Otherwise negative. Other: None. MRA HEAD FINDINGS Internal carotid arteries:  Patent. Anterior cerebral arteries:  Patent. Middle cerebral arteries: Patent. Mild right M1 stenosis and A1 stenosis. Multiple segments of mild stenosis and bilateral M2 distributions. Anterior communicating artery: Patent. Posterior communicating arteries:  Patent.  Fetal left PCA. Posterior cerebral arteries: Right proximal PCA occlusion with poor distal PCA collateralization. Patent left proximal PCA and downstream circulation. Basilar artery: Small caliber basilar artery likely due to left fetal and prominent right PCA circulation. Mild irregularity of the basilar artery with mild-to-moderate distal stenosis. Vertebral arteries: Patent.) Right proximal PICA moderate stenosis. No aneurysm identified. IMPRESSION: MRI head: Early subacute infarction involving the right PCA distribution and right posterior hemisphere watershed. Minimal  petechial hemorrhage in right occipital lobe. Minimal local mass effect. MRA head: 1. Right proximal PCA occlusion with poor right PCA distribution collateralization. 2. Intracranial atherosclerosis with multiple segments of mild-to-moderate stenosis in the anterior and posterior circulation. These results will be called to the ordering clinician or representative by the Radiologist Assistant, and communication documented in the PACS or zVision Dashboard. Electronically Signed   By: Mitzi Hansen M.D.   On: 05/08/2017 01:32   Mr Maxine Glenn Head Wo Contrast  Result Date: 05/08/2017 CLINICAL DATA:  77 y/o F; 3 days of left-sided weakness, numbness, left facial droop, and speech difficulty. EXAM: MRI HEAD WITHOUT CONTRAST MRA HEAD WITHOUT CONTRAST TECHNIQUE: Multiplanar, multiecho pulse sequences of the brain and surrounding structures were obtained without intravenous contrast. Angiographic images of the head were obtained using MRA technique without contrast. COMPARISON:  11/07/2016 MRI head and MRA head.  05/07/2016 CT head. FINDINGS: MRI HEAD FINDINGS Brain: Reduced diffusion within the right posterior parietal and occipital lobes with scattered small foci of reduced diffusion throughout the right superior parietal lobe and right posterior frontal lobe inclusive of precentral gyrus. Areas of infarction are associated with reduced diffusion and mild local mass effect compatible with early subacute infarction. Speckled foci of susceptibility hypointensity in right occipital lobe probably represent minimal petechial hemorrhage. Background of mild chronic microvascular ischemic changes and parenchymal volume loss of the brain. No hydrocephalus, extra-axial collection, effacement of basilar cisterns, or additional focus of mass effect. Vascular: As below. Skull and upper cervical spine: Normal marrow signal. Sinuses/Orbits: Mild ethmoid and frontal sinus antrum mucosal thickening. Otherwise negative. Other:  None. MRA HEAD FINDINGS Internal carotid arteries:  Patent. Anterior cerebral arteries:  Patent. Middle cerebral arteries: Patent. Mild right M1 stenosis  and A1 stenosis. Multiple segments of mild stenosis and bilateral M2 distributions. Anterior communicating artery: Patent. Posterior communicating arteries:  Patent.  Fetal left PCA. Posterior cerebral arteries: Right proximal PCA occlusion with poor distal PCA collateralization. Patent left proximal PCA and downstream circulation. Basilar artery: Small caliber basilar artery likely due to left fetal and prominent right PCA circulation. Mild irregularity of the basilar artery with mild-to-moderate distal stenosis. Vertebral arteries: Patent.) Right proximal PICA moderate stenosis. No aneurysm identified. IMPRESSION: MRI head: Early subacute infarction involving the right PCA distribution and right posterior hemisphere watershed. Minimal petechial hemorrhage in right occipital lobe. Minimal local mass effect. MRA head: 1. Right proximal PCA occlusion with poor right PCA distribution collateralization. 2. Intracranial atherosclerosis with multiple segments of mild-to-moderate stenosis in the anterior and posterior circulation. These results will be called to the ordering clinician or representative by the Radiologist Assistant, and communication documented in the PACS or zVision Dashboard. Electronically Signed   By: Mitzi HansenLance  Furusawa-Stratton M.D.   On: 05/08/2017 01:32   Echocardiogram:                                               Study Conclusions - Left ventricle: The cavity size was normal. Systolic function was   vigorous. The estimated ejection fraction was in the range of 65%   to 70%. Although no diagnostic regional wall motion abnormality   was identified, this possibility cannot be completely excluded on   the basis of this study. Doppler parameters are consistent with   abnormal left ventricular relaxation (grade 1 diastolic   dysfunction). -  Aortic valve: There was trivial regurgitation. - Mitral valve: Mildly calcified annulus. - Pulmonary arteries: Systolic pressure was mildly increased. PA   peak pressure: 38 mm Hg (S).  TEE Study Conclusions - Left ventricle: Systolic function was normal. The estimated   ejection fraction was in the range of 55% to 60%. Wall motion was   normal; there were no regional wall motion abnormalities. - Aortic valve: Trileaflet; mildly thickened, mildly calcified   leaflets. - Left atrium: No evidence of thrombus in the atrial cavity or   appendage. No evidence of thrombus in the appendage. No evidence   of thrombus in the atrial cavity or appendage. - Right atrium: No evidence of thrombus in the atrial cavity or   appendage.  B/L Carotid Duplex        11/07/2016 Findings suggest 1-39% internal carotid artery stenosis bilaterally. Vertebral arteries are patent with antegrade flow.    IMPRESSION: Ms. Angelena FormMarcelina Escobar Guevara is a 77 y.o. female with PMH of HTN, HLD, DM, and PVD with 2-3 day history of left sided weakness. MRI reveals: 1. Exam reveals left visual field cut, right gaze preference and mild weakness of LUE and LLE.   Early/Subacute infarct Right PCA Infarct and Right posterior hemisphere watershed:  Suspected Etiology: small vessel disease Resultant Symptoms:  left visual field cut, right gaze preference and mild weakness of LUE and LLE.  Stroke Risk Factors: diabetes mellitus, hyperlipidemia and hypertension Other Stroke Risk Factors: Advanced age, Obesity, Body mass index is 39.31 kg/m. , CKD  Outstanding Stroke Work-up Studies:    Work up is completed  PLAN  05/09/2017: Continue Aspirin/ Plavix/ Statin Frequent neuro checks Telemetry monitoring PT/OT/SLP Consult PM & Rehab Consult Case Management /MSW  Ongoing aggressive stroke risk factor management Patient  counseled to be compliant with her antithrombotic medications Patient counseled on Lifestyle modifications  including, Diet, Exercise, and Stress Follow up with [facility] in 6 weeks  R/O AFIB: TEE done today Loop Recorder Placement prior to discharge  HYPERTENSION: Stable, some elevated B/P's overnight Permissive hypertension (OK if <220/120) for 24-48 hours post stroke and then gradually normalized within 5-7 days. Long term BP goal normotensive. May slowly restart home B/P medications after 48 hours Home Meds: Norvasc, Hyzaar  HYPERLIPIDEMIA:    Component Value Date/Time   CHOL 122 05/08/2017 0503   TRIG 179 (H) 05/08/2017 0503   HDL 28 (L) 05/08/2017 0503   CHOLHDL 4.4 05/08/2017 0503   VLDL 36 05/08/2017 0503   LDLCALC 58 05/08/2017 0503  Home Meds:  Lipitor 80 mg LDL  goal < 70 Continued on Lipitor to 80 mg daily Continue statin at discharge  DIABETES: Lab Results  Component Value Date   HGBA1C 12.7 (H) 05/08/2017  HgbA1c goal < 7.0 Currently on: Novolog Continue CBG monitoring and SSI to maintain glucose 140-180 mg/dl DM education   OBESITY Obesity, Body mass index is 39.31 kg/m. Greater than/equal to 30  Other Active Problems: Principal Problem:   Stroke [facility]) Active Problems:   Essential hypertension   Insulin dependent type 2 diabetes mellitus, uncontrolled (HCC)   History of CVA (cerebrovascular accident)    Hospital day # 1 VTE prophylaxis: Heparin Diet : Fall precautions Diet Carb Modified Fluid consistency: Thin; Room service appropriate? Yes   FAMILY UPDATES: family at bedside  TEAM UPDATES: Glade Lloyd, MD    Prior Home Stroke Medications:  aspirin 325 mg daily and clopidogrel 75 mg daily  Discharge Stroke Meds:  Please discharge patient on aspirin 325 mg daily and clopidogrel 75 mg daily   Disposition: 06-Home-Health Care Svc Therapy Recs:               PENDING Follow Up:  Follow-up Information    Nilda Riggs, NP. Schedule an appointment as soon as possible for a visit in 6 week(s).   Specialty:  Family  Medicine Contact information: [address] [place] [address] [phone]          Hoy Register, MD -PCP Follow up in 1-2 weeks      Assessment & plan discussed with with attending physician and they are in agreement.    Beryl Meager, ANP-C Stroke Neurology Team 05/09/2017 1:07 PM   05/09/2017 ATTENDING ASSESSMENT:   I reviewed above note and agree with the assessment and plan. I have made any additions or clarifications directly to the above note. Pt was seen and examined.   77 year old female with history of diabetes, hypertension, hyperlipidemia, peripheral vascular disease, on aspirin Plavix and statin at home admitted for left-sided weakness for 2-3 days, left facial droop, slurred speech and suffered a fall.  CT head showed right PCA subacute infarcts.  MRI showed right inferior MCA, MCA/PCA, MCA/ACA infarct as well as right CR infarcts.  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  CTA head neck showed stable right PCA severe stenosis, right M1 and M2 stenosis from 10/2016 and progressed right VA origin and severe left V4 stenosis from 10/2016.  No carotid stenosis.  EF 65-70%.  LDL 58 and A1c 12.7.  UDS neg.  Patient stroke embolic-looking, concerning for cardioembolic source.  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 She does have  multiple stroke risk factors too. TEE negative and loop recorder placed. Continue aspirin Plavix and statin on discharge.  Neurology will sign off. Please call with questions. Pt will follow up with stroke clinic NP at [facility] in about 4 weeks. Thanks for the consult.  Marvel Plan, MD PhD Stroke Neurology 05/09/2017 1:07 PM    To contact Stroke Continuity provider, please refer to [email or web address]. After hours, contact General Neurology

## 2017-05-09 NOTE — Progress Notes (Signed)
Discharge instructions reviewed with patient/family. All questions answered at this time. Transport home by family.   Chamar Broughton, RN 

## 2017-05-10 ENCOUNTER — Encounter (HOSPITAL_COMMUNITY): Payer: Self-pay | Admitting: Cardiology

## 2017-05-10 ENCOUNTER — Telehealth: Payer: Self-pay | Admitting: Family Medicine

## 2017-05-10 MED FILL — Lidocaine Inj 1% w/ Epinephrine-1:100000: INTRAMUSCULAR | Qty: 20 | Status: AC

## 2017-05-10 NOTE — Telephone Encounter (Signed)
Call placed to patient #[phone], to check on her status and to inform her of the Transitional Care Clinic. No answer. Voicemail has not been set up.  Call placed to #[phone], and spoke with patient's son. He informed me that patient can best be reached at the other number [phone]). He informed me that patient only speaks [place]. Mentioned to Alecia LemmingVictor, that I speak Spanish and that I'll be the one speaking with her. Informed and explained to Alecia LemmingVictor [facility] and he agreed to have mother be seen by provider. Expressed to Alecia LemmingVictor that I'll call his mother again to get her consent too. At the end of the conversation Alecia LemmingVictor asked [facility] or would patient have to use our pharmacy. I informed Alecia LemmingVictor they can choose which ever is convenient to them.

## 2017-05-11 ENCOUNTER — Telehealth: Payer: Self-pay | Admitting: Family Medicine

## 2017-05-11 NOTE — Telephone Encounter (Signed)
Okay to give verbal order for PT.  Those prescriptions were  prescribed on 05/09/17 at the time of her discharge. Skilled nursing referral is usually placed from inpatient hospitalization.  We will need to check with the case manager to see what options are available.

## 2017-05-11 NOTE — Telephone Encounter (Signed)
Physical therapist Beatris SiJim Huffman  Called to Recommend  -2 wk for 3 functional mobility, Referral for skilled nursing and disease management -clopidogrel (PLAVIX) 75 MG tablet  -senna-docusate (SENOKOT-S) 8.6-50 MG tablet -isosorbide mononitrate (IMDUR) 60 MG 24 hr tablet Refilled to [facility] pharmacy Please follow up

## 2017-05-11 NOTE — Telephone Encounter (Signed)
Call placed to patient #[phone] to schedule a hospital f/u and explain the TCC clinic and services. Spoke briefly with patient and explained to her what the Transitional Care Clinic is and it's services. Patient stated that the doctor at the hospital wanted her to follow up with her primary doctor. Patient stated that it was best to speak with her son, Alecia LemmingVictor, to coordinate appointment. Patient also gave me consent to call him and discuss TCC services.   During conversation with patient, I also spoke with Rosanne AshingJim, PT from [facility], who was at the patient's house. Informed Rosanne AshingJim who I was and the reason for my call. Rosanne AshingJim also confirmed that it was best to speak with patient's son. During our conversation Rosanne AshingJim wanted to confirm that patient was prescribed 35 units of Lantus and was instructed to take it twice a day, and not 30 units as what patient had stated. I did confirm with Rosanne AshingJim that it's 35 units as well as patient was instructed to start taking Sena-docusate. Rosanne AshingJim understood and had no further questions.  Call placed to patient's son Alecia LemmingVictor, #[phone] to schedule patient's appointment. Spoke with Alecia LemmingVictor and reminded him of our Transitional Care Clinic and it's services. Alecia LemmingVictor agreed for mother to be seen this coming Monday 3/4 @ 2:15pm. During our conversation Alecia LemmingVictor mentioned that patient lives family member, niece. Alecia LemmingVictor goes in the afternoon to visit patient and give her her medication. Alecia LemmingVictor mentioned that patient will be received PT but is not sure for how long. Alecia LemmingVictor informed me that patient has a glucometer at home but feels like it's not working properly. Advised him to bring the machine in as well as all his mother's medication to the appointment. Alecia LemmingVictor expressed that somebody came to help them apply for Medicaid while mother was in the hospital.   Call placed to [facility] #[phone] regarding services for patient. Spoke with Onalee Huaavid and he informed me that they have  scheduled for a medical social worker to go out and visit patient as well as have PT doing an admission visit. The number of PT visits has not been determined yet.

## 2017-05-14 ENCOUNTER — Telehealth: Payer: Self-pay

## 2017-05-14 ENCOUNTER — Encounter: Payer: Self-pay | Admitting: Family Medicine

## 2017-05-14 ENCOUNTER — Ambulatory Visit: Payer: Self-pay | Attending: Family Medicine | Admitting: Family Medicine

## 2017-05-14 VITALS — BP 132/68 | HR 68 | Temp 98.0°F | Ht 64.0 in | Wt 201.2 lb

## 2017-05-14 DIAGNOSIS — Z7982 Long term (current) use of aspirin: Secondary | ICD-10-CM | POA: Insufficient documentation

## 2017-05-14 DIAGNOSIS — G441 Vascular headache, not elsewhere classified: Secondary | ICD-10-CM | POA: Insufficient documentation

## 2017-05-14 DIAGNOSIS — Z9119 Patient's noncompliance with other medical treatment and regimen: Secondary | ICD-10-CM | POA: Insufficient documentation

## 2017-05-14 DIAGNOSIS — M542 Cervicalgia: Secondary | ICD-10-CM

## 2017-05-14 DIAGNOSIS — Z794 Long term (current) use of insulin: Secondary | ICD-10-CM | POA: Insufficient documentation

## 2017-05-14 DIAGNOSIS — E785 Hyperlipidemia, unspecified: Secondary | ICD-10-CM | POA: Insufficient documentation

## 2017-05-14 DIAGNOSIS — E11649 Type 2 diabetes mellitus with hypoglycemia without coma: Secondary | ICD-10-CM

## 2017-05-14 DIAGNOSIS — I69398 Other sequelae of cerebral infarction: Secondary | ICD-10-CM | POA: Insufficient documentation

## 2017-05-14 DIAGNOSIS — R29810 Facial weakness: Secondary | ICD-10-CM | POA: Insufficient documentation

## 2017-05-14 DIAGNOSIS — E1169 Type 2 diabetes mellitus with other specified complication: Secondary | ICD-10-CM | POA: Insufficient documentation

## 2017-05-14 DIAGNOSIS — Z7902 Long term (current) use of antithrombotics/antiplatelets: Secondary | ICD-10-CM | POA: Insufficient documentation

## 2017-05-14 DIAGNOSIS — I69354 Hemiplegia and hemiparesis following cerebral infarction affecting left non-dominant side: Secondary | ICD-10-CM | POA: Insufficient documentation

## 2017-05-14 DIAGNOSIS — E1151 Type 2 diabetes mellitus with diabetic peripheral angiopathy without gangrene: Secondary | ICD-10-CM | POA: Insufficient documentation

## 2017-05-14 DIAGNOSIS — E7800 Pure hypercholesterolemia, unspecified: Secondary | ICD-10-CM

## 2017-05-14 DIAGNOSIS — E1140 Type 2 diabetes mellitus with diabetic neuropathy, unspecified: Secondary | ICD-10-CM | POA: Insufficient documentation

## 2017-05-14 DIAGNOSIS — I6340 Cerebral infarction due to embolism of unspecified cerebral artery: Secondary | ICD-10-CM

## 2017-05-14 DIAGNOSIS — I10 Essential (primary) hypertension: Secondary | ICD-10-CM | POA: Insufficient documentation

## 2017-05-14 DIAGNOSIS — N179 Acute kidney failure, unspecified: Secondary | ICD-10-CM | POA: Insufficient documentation

## 2017-05-14 DIAGNOSIS — Z79899 Other long term (current) drug therapy: Secondary | ICD-10-CM | POA: Insufficient documentation

## 2017-05-14 LAB — GLUCOSE, POCT (MANUAL RESULT ENTRY): POC Glucose: 162 mg/dl — AB (ref 70–99)

## 2017-05-14 MED ORDER — BUTALBITAL-APAP-CAFFEINE 50-300-40 MG PO CAPS: 1.0000 | ORAL_CAPSULE | Freq: Two times a day (BID) | ORAL | 1 refills | Status: DC | PRN

## 2017-05-14 MED ORDER — GABAPENTIN 300 MG PO CAPS: 300.0000 mg | ORAL_CAPSULE | Freq: Every day | ORAL | 5 refills | Status: DC

## 2017-05-14 MED ORDER — ATORVASTATIN CALCIUM 80 MG PO TABS: 80.0000 mg | ORAL_TABLET | Freq: Every day | ORAL | 5 refills | Status: DC

## 2017-05-14 MED ORDER — CLOPIDOGREL BISULFATE 75 MG PO TABS: 75.0000 mg | ORAL_TABLET | Freq: Every day | ORAL | 5 refills | Status: DC

## 2017-05-14 MED ORDER — BACLOFEN 10 MG PO TABS
10.0000 mg | ORAL_TABLET | Freq: Two times a day (BID) | ORAL | 3 refills | Status: DC
Start: 2017-05-14 — End: 2017-06-19

## 2017-05-14 MED ORDER — TRUE METRIX METER DEVI: 1.0000 | Freq: Three times a day (TID) | 0 refills | Status: AC

## 2017-05-14 MED ORDER — ISOSORBIDE MONONITRATE ER 60 MG PO TB24: 60.0000 mg | ORAL_TABLET | Freq: Every day | ORAL | 5 refills | Status: DC

## 2017-05-14 MED ORDER — INSULIN GLARGINE 100 UNIT/ML ~~LOC~~ SOLN: 35.0000 [IU] | Freq: Two times a day (BID) | SUBCUTANEOUS | 5 refills | Status: DC

## 2017-05-14 MED ORDER — TRUEPLUS LANCETS 28G MISC: 1.0000 | Freq: Three times a day (TID) | 12 refills | Status: DC

## 2017-05-14 MED ORDER — AMLODIPINE BESYLATE 10 MG PO TABS: 10.0000 mg | ORAL_TABLET | Freq: Every day | ORAL | 5 refills | Status: DC

## 2017-05-14 MED ORDER — TRUEPLUS LANCETS 28G MISC: 1.0000 | Freq: Three times a day (TID) | 12 refills | Status: AC

## 2017-05-14 MED ORDER — GLUCOSE BLOOD VI STRP: ORAL_STRIP | 12 refills | Status: DC

## 2017-05-14 MED FILL — !LANTUS 100 UNITS/ML VIAL: 100 | 28 days supply | Qty: 20 | Fill #0

## 2017-05-14 MED FILL — GABAPENTIN 300 MG CAPSULE: 300 | 30 days supply | Qty: 30 | Fill #0

## 2017-05-14 MED FILL — AMLODIPINE BESYLATE 10 MG T: 10 | 30 days supply | Qty: 30 | Fill #0

## 2017-05-14 MED FILL — TRUEplus LANCETS 28G MISC: 30 days supply | Qty: 100 | Fill #0

## 2017-05-14 MED FILL — ISOSORBIDE MN ER 60 MG TAB: 60 | 30 days supply | Qty: 30 | Fill #0

## 2017-05-14 MED FILL — BUTALB-ACETAMIN-CAFF 50-300: 50-300-40 | 20 days supply | Qty: 40 | Fill #0

## 2017-05-14 MED FILL — !TRUE METRIX BLOOD GLUCOSE: 365 days supply | Qty: 1 | Fill #0

## 2017-05-14 MED FILL — TRUE METRIX TEST STRIP: 30 days supply | Qty: 100 | Fill #0

## 2017-05-14 MED FILL — ATORVASTATIN 80 MG TABLET: 80 | 30 days supply | Qty: 30 | Fill #0

## 2017-05-14 NOTE — Telephone Encounter (Signed)
[facility] and [facility] for skilled nursing for disease management. Herbert SetaHeather stated that they would provide skilled nursing for the patient despite the fact that she does not have insurance and it was not requested at discharge.  [facility]

## 2017-05-14 NOTE — Telephone Encounter (Signed)
Done

## 2017-05-14 NOTE — Progress Notes (Signed)
Subjective:  Patient ID: Meghan Ford, female    DOB: [birth year]  Age: 77 y.o. MRN: [id number]  CC: Hospitalization Follow-up   HPI Meghan Ford  is a 77 year old female with a history of type 2 diabetes mellitus (A1c 12.7), diabetic neuropathy, hypertension, Hyperlipidemia, noncompliance who is here for a follow-up visit after hospitalization at [facility] from 05/07/17 through 05/09/17 for a right PCA stroke.  She had presented with left-sided weakness and facial droop and CT head without contrast and MRA of the brain revealed infarct in the distribution of the PCA region.  CT angio of the head revealed severely diseased proximal right PCA.  CT head without contrast: IMPRESSION: 1. 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 Watershed infarcts are also present in the deep periventricular white matter. 2. No definite signs of acute stroke or hemorrhage.  MRA head: 1. Right proximal PCA occlusion with poor right PCA distribution collateralization. 2. Intracranial atherosclerosis with multiple segments of mild-to-moderate stenosis in the anterior and posterior circulation.  She was seen by neurology and commenced on aspirin 325 mg, Plavix and high-dose statin.  TEE was unremarkable, 2D echo revealed ejection fraction of 65-70% with grade 1 DD. Cardiology inserted a loop recorder. Hydrochlorothiazide and losartan with discontinued due to acute on chronic kidney injury and she was commenced on amlodipine and isosorbide. She underwent PT, speech therapy and was subsequently discharged.  Today she complains of headaches on the left side of her head which radiates to her occiput will do worse at night and associated with left-sided face pain which extends to the left side of her neck and down her  left arm.  Pain is described as a 7/10.  She denies nausea, vomiting or blurry vision.  With regards to her loop recorder she is yet to make an appointment for wound check and has also not made an appointment with neurology for hospital follow-up.  Past Medical History:  Diagnosis Date  . Diabetes mellitus type II, uncontrolled ([place])   . Diabetes mellitus without complication ([place])   . Fx humeral neck   . Hyperlipidemia LDL goal < 100   . Hypertension   . Leg pain    worse with prolonged standing  . Peripheral vascular disease ([place])   . Stroke [facility]) 2018; 05/08/2017   denies residual from 2018 stroke on 05/08/2017; weakness on left; speech issues from today's stroke (05/08/2017)  . Varicose veins     Past Surgical History:  Procedure Laterality Date  . FRACTURE SURGERY    . LOOP RECORDER INSERTION N/A 05/09/2017   Procedure: LOOP RECORDER INSERTION;  Surgeon: Constance Haw, MD;  Location: [facility];  Service: Cardiovascular;  Laterality: N/A;  . REVERSE SHOULDER ARTHROPLASTY Left 10/27/2014   Procedure: REVERSE SHOULDER ARTHROPLASTY;  Surgeon: Renette Butters, MD;  Location: [facility];  Service: Orthopedics;  Laterality: Left;  . TEE WITHOUT CARDIOVERSION N/A 05/09/2017   Procedure: TRANSESOPHAGEAL ECHOCARDIOGRAM (TEE);  Surgeon: Jerline Pain, MD;  Location: [facility] ENDOSCOPY;  Service: Cardiovascular;  Laterality: N/A;  . TOTAL SHOULDER ARTHROPLASTY Left 10/27/2014   Procedure: TOTAL SHOULDER ARTHROPLASTY;  Surgeon: Renette Butters, MD;  Location: [facility];  Service: Orthopedics;  Laterality: Left;  Marland Kitchen VEIN SURGERY Left    left leg    No Known Allergies   Outpatient Medications Prior to Visit  Medication Sig  Dispense Refill  . blood glucose meter kit and supplies KIT Dispense based on patient and insurance preference. Use up to four times daily as directed. (FOR ICD-9 250.00, 250.01). 1 each 0  . glucose blood test strip Use as instructed 100 each 12  . amLODipine (NORVASC)  10 MG tablet Take 10 mg by mouth daily.    Marland Kitchen atorvastatin (LIPITOR) 80 MG tablet Take 1 tablet (80 mg total) by mouth daily. 30 tablet 5  . clopidogrel (PLAVIX) 75 MG tablet Take 1 tablet (75 mg total) by mouth daily. 30 tablet 0  . gabapentin (NEURONTIN) 300 MG capsule Take 1 capsule (300 mg total) by mouth at bedtime.    . insulin glargine (LANTUS) 100 UNIT/ML injection Inject 0.35 mLs (35 Units total) into the skin 2 (two) times daily.    . isosorbide mononitrate (IMDUR) 60 MG 24 hr tablet Take 1 tablet (60 mg total) by mouth daily. 30 tablet 0  . TRUEPLUS LANCETS 28G MISC 1 each by Does not apply route 3 (three) times daily before meals. 100 each 12  . aspirin 325 MG tablet Take 1 tablet (325 mg total) by mouth daily. (Patient not taking: Reported on 05/14/2017) 30 tablet 2  . senna-docusate (SENOKOT-S) 8.6-50 MG tablet Take 1 tablet by mouth at bedtime as needed for mild constipation. (Patient not taking: Reported on 05/14/2017) 10 tablet 0   No facility-administered medications prior to visit.     ROS Review of Systems  Constitutional: Negative for activity change, appetite change and fatigue.  HENT: Negative for congestion, sinus pressure and sore throat.   Eyes: Negative for visual disturbance.  Respiratory: Negative for cough, chest tightness, shortness of breath and wheezing.   Cardiovascular: Negative for chest pain and palpitations.  Gastrointestinal: Negative for abdominal distention, abdominal pain and constipation.  Endocrine: Negative for polydipsia.  Genitourinary: Negative for dysuria and frequency.  Musculoskeletal: Negative for arthralgias and back pain.  Skin: Negative for rash.  Neurological: Positive for weakness and headaches. Negative for tremors, light-headedness and numbness.  Hematological: Does not bruise/bleed easily.  Psychiatric/Behavioral: Negative for agitation and behavioral problems.    Objective:  BP 132/68   Pulse 68   Temp 98 F (36.7 C) (Oral)    Ht 5' 4" (1.626 m)   Wt 201 lb 3.2 oz (91.3 kg)   SpO2 99%   BMI 34.54 kg/m   BP/Weight 05/14/2017 05/09/2017 5/64/3329  Systolic BP 518 841 -  Diastolic BP 68 53 -  Wt. (Lbs) 201.2 - 229  BMI 34.54 - 39.31      Physical Exam  Constitutional: She is oriented to person, place, and time. She appears well-developed and well-nourished.  Cardiovascular: Normal rate, normal heart sounds and intact distal pulses.  No murmur heard. Pulmonary/Chest: Effort normal and breath sounds normal. She has no wheezes. She has no rales. She exhibits no tenderness.  Loop recorder in left upper chest wall  Abdominal: Soft. Bowel sounds are normal. She exhibits no distension and no mass. There is no tenderness.  Musculoskeletal:  Active abduction of left upper extremity limited to 100 degrees. Tenderness on palpation of left side of neck and left upper extremity.  Neurological: She is alert and oriented to person, place, and time.  Strength: LUE - 4+/5, LLE-5/5 RUE -5/5, RLE - 5/5  Skin: Skin is warm and dry.  Psychiatric: She has a normal mood and affect.     CMP Latest Ref Rng & Units 05/09/2017 05/08/2017 05/07/2017  Glucose 65 -  99 mg/dL 131(H) 229(H) 374(H)  BUN 6 - 20 mg/dL 34(H) 45(H) 44(H)  Creatinine 0.44 - 1.00 mg/dL 1.51(H) 1.75(H) 1.90(H)  Sodium 135 - 145 mmol/L 140 137 135  Potassium 3.5 - 5.1 mmol/L 4.0 4.2 4.2  Chloride 101 - 111 mmol/L 104 100(L) 97(L)  CO2 22 - 32 mmol/L 25 22 -  Calcium 8.9 - 10.3 mg/dL 8.6(L) 9.0 -  Total Protein 6.5 - 8.1 g/dL - - -  Total Bilirubin 0.3 - 1.2 mg/dL - - -  Alkaline Phos 38 - 126 U/L - - -  AST 15 - 41 U/L - - -  ALT 14 - 54 U/L - - -    Lab Results  Component Value Date   HGBA1C 12.7 (H) 05/08/2017    Assessment & Plan:   1. Type 2 diabetes mellitus with other specified complication, without long-term current use of insulin (HCC) Uncontrolled Insulin regimen was adjusted during hospitalization No regimen change today - POCT glucose  (manual entry) - Blood Glucose Monitoring Suppl (TRUE METRIX METER) DEVI; 1 each by Does not apply route 3 (three) times daily before meals.  Dispense: 1 Device; Refill: 0 - glucose blood (TRUE METRIX BLOOD GLUCOSE TEST) test strip; Use 3 times daily before meals  Dispense: 100 each; Refill: 12  2. 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  Dispense: 30 tablet; Refill: 5  3. Pure hypercholesterolemia Stable Low cholesterol diet - atorvastatin (LIPITOR) 80 MG tablet; Take 1 tablet (80 mg total) by mouth daily.  Dispense: 30 tablet; Refill: 5  4. Essential hypertension Controlled - isosorbide mononitrate (IMDUR) 60 MG 24 hr tablet; Take 1 tablet (60 mg total) by mouth daily.  Dispense: 30 tablet; Refill: 5 - amLODipine (NORVASC) 10 MG tablet; Take 1 tablet (10 mg total) by mouth daily.  Dispense: 30 tablet; Refill: 5  5. Type 2 diabetes mellitus with diabetic neuropathy, with long-term current use of insulin (HCC) Stable - gabapentin (NEURONTIN) 300 MG capsule; Take 1 capsule (300 mg total) by mouth at bedtime.  Dispense: 30 capsule; Refill: 5 - insulin glargine (LANTUS) 100 UNIT/ML injection; Inject 0.35 mLs (35 Units total) into the skin 2 (two) times daily.  Dispense: 30 mL; Refill: 5 - TRUEPLUS LANCETS 28G MISC; 1 each by Does not apply route 3 (three) times daily before meals.  Dispense: 100 each; Refill: 12  6. Other vascular headache Post stroke headache - Butalbital-APAP-Caffeine (FIORICET) 50-300-40 MG CAPS; Take 1 tablet by mouth every 12 (twelve) hours as needed.  Dispense: 40 capsule; Refill: 1  7. Neck pain Continue PT - baclofen (LIORESAL) 10 MG tablet; Take 1 tablet (10 mg total) by mouth 2 (two) times daily.  Dispense: 60 each; Refill: 3   Meds ordered this  encounter  Medications  . atorvastatin (LIPITOR) 80 MG tablet    Sig: Take 1 tablet (80 mg total) by mouth daily.    Dispense:  30 tablet    Refill:  5  . clopidogrel (PLAVIX) 75 MG tablet    Sig: Take 1 tablet (75 mg total) by mouth daily.    Dispense:  30 tablet    Refill:  5  . isosorbide mononitrate (IMDUR) 60 MG 24 hr tablet    Sig: Take 1 tablet (60 mg total) by mouth daily.    Dispense:  30 tablet    Refill:  5  . gabapentin (NEURONTIN) 300 MG capsule    Sig: Take 1 capsule (300 mg total) by mouth at bedtime.    Dispense:  30 capsule    Refill:  5    Discontinue previous dose  . amLODipine (NORVASC) 10 MG tablet    Sig: Take 1 tablet (10 mg total) by mouth daily.    Dispense:  30 tablet    Refill:  5  . insulin glargine (LANTUS) 100 UNIT/ML injection    Sig: Inject 0.35 mLs (35 Units total) into the skin 2 (two) times daily.    Dispense:  30 mL    Refill:  5    Discontinue previous dose  . Blood Glucose Monitoring Suppl (TRUE METRIX METER) DEVI    Sig: 1 each by Does not apply route 3 (three) times daily before meals.    Dispense:  1 Device    Refill:  0  . Butalbital-APAP-Caffeine (FIORICET) 50-300-40 MG CAPS    Sig: Take 1 tablet by mouth every 12 (twelve) hours as needed.    Dispense:  40 capsule    Refill:  1  . baclofen (LIORESAL) 10 MG tablet    Sig: Take 1 tablet (10 mg total) by mouth 2 (two) times daily.    Dispense:  60 each    Refill:  3  . TRUEPLUS LANCETS 28G MISC    Sig: 1 each by Does not apply route 3 (three) times daily before meals.    Dispense:  100 each    Refill:  12  . glucose blood (TRUE METRIX BLOOD GLUCOSE TEST) test strip    Sig: Use 3 times daily before meals    Dispense:  100 each    Refill:  12    Follow-up: Return in about 3 months (around 08/14/2017) for Follow-up of chronic medical conditions.   Charlott Rakes MD

## 2017-05-14 NOTE — Patient Instructions (Signed)
Prevención del ictus  (Stroke Prevention)  Algunos problemas de salud y ciertas conductas favorecen el ictus. A continuación se indican algunas formas de disminuir el riesgo de tener un ictus.  · Haga alguna actividad física al menos durante 30 minutos todos los días.  · No fume. Trate de no rodearse de personas que fuman.  · No beba alcohol en exceso.  ? No beba más de dos copas al día si es hombre.  ? No beba más de una copa al día si es mujer y no está embarazada.  · Consuma alimentos saludables, como frutas y verduras. Si le indican una dieta específica, sígala estrictamente.  · Mantenga sus niveles de colesterol bajo control con la dieta y medicamentos. Consuma alimentos bajos en grasas saturadas, grasas trans, colesterol y ricos en fibra.  · Si tiene diabetes, siga todos los planes dietéticos y tome los medicamentos como se le indique.  · Pregúntele al médico si necesita tratamiento para disminuir la presión arterial. Si tiene presión arterial alta (hipertensión), siga una dieta y tome los medicamentos como se lo haya indicado el médico.  · Si usted tiene entre 18 y 39 años, debe medirse la presión arterial cada 3 a 5 años. Si usted tiene 40 años o más, debe medirse la presión arterial todos los años.  · Mantenga un peso saludable. Consuma alimentos bajos en calorías, sal, grasas saturadas, grasas trans y colesterol.  · No consuma drogas.  · Evite las píldoras anticonceptivas, si corresponde. Hable con su médico acerca de los riesgos de tomar píldoras anticonceptivas.  · Hable con su médico si usted tiene problemas de sueño (apnea del sueño).  · Tome todos los medicamentos según las indicaciones de su médico.  ? Es posible que le indiquen que tome aspirina o anticoagulantes. Tome los medicamentos como le indicó el médico.  ? Conozca todas las instrucciones de los medicamentos.  · Asegúrese de tener bajo control cualquier otra enfermedad que tenga.  SOLICITE AYUDA DE INMEDIATO SI:  · Pierde repentinamente la  sensibilidad (siente adormecimiento) o siente debilidad en el rostro, un brazo o una pierna.  · Su cara o párpado se caen hacia un lado.  · Se siente súbitamente confundido.  · Tiene dificultad para hablar afasia o comprender lo que las personas dicen.  · Presenta dificultad para la visión de uno o ambos ojos.  · Tiene dificultad repentina para caminar.  · Tiene mareos.  · Pierde el equilibrio o sus movimientos son torpes (falta de coordinación).  · Siente un dolor de cabeza súbito e intenso y no sabe la causa.  · Siente un dolor nuevo en el pecho.  · Siente un aleteo en el corazón o le falta un latido (ritmo cardíaco irregular).  No espere para ver si los síntomas desaparecen. Solicite ayuda de inmediato. Comuníquese con el servicio de emergencias de su localidad (911 en los [place]). No conduzca por sus propios medios hasta el hospital.  Esta información no tiene como fin reemplazar el consejo del médico. Asegúrese de hacerle al médico cualquier pregunta que tenga.  Document Released: 08/29/2011 Document Revised: 03/20/2014 Document Reviewed: 08/30/2012  Elsevier Interactive Patient Education © 2018 [employer]vier Inc.

## 2017-05-14 NOTE — Progress Notes (Signed)
Patient is having headaches at night and pain on left side of face.

## 2017-05-16 ENCOUNTER — Telehealth: Payer: Self-pay | Admitting: Family Medicine

## 2017-05-16 NOTE — Telephone Encounter (Signed)
Okay to give verbal order.

## 2017-05-16 NOTE — Telephone Encounter (Signed)
Nurse alexis from [facility] called to request orders for Nursing visits once a week for 2weeks CVA orders Please follow up

## 2017-05-16 NOTE — Telephone Encounter (Signed)
Meghan Ford was called and given the verbal orders for nurse visits.

## 2017-05-21 ENCOUNTER — Telehealth: Payer: Self-pay | Admitting: Family Medicine

## 2017-05-21 NOTE — Telephone Encounter (Signed)
Received fax from [facility] for Orders, Fax will on the pcp in-box

## 2017-05-22 ENCOUNTER — Ambulatory Visit (INDEPENDENT_AMBULATORY_CARE_PROVIDER_SITE_OTHER): Payer: Self-pay | Admitting: *Deleted

## 2017-05-22 DIAGNOSIS — I639 Cerebral infarction, unspecified: Secondary | ICD-10-CM

## 2017-05-22 LAB — CUP PACEART INCLINIC DEVICE CHECK
Date Time Interrogation Session: 20190312120948
MDC IDC PG IMPLANT DT: 20190227

## 2017-05-22 NOTE — Progress Notes (Signed)
Wound check appointment. Steri-strips removed. Wound without redness or edema. Incision edges approximated, wound well healed. Normal device function. Battery status: good. R-waves 0.10322mV. No symptom, tachy, brady, pause, or AF episodes. Pause and brady detection remain (recent falls per family member), brady duration extended to 12 beats, pause duration extended to 4.5sec. Patient educated about wound care and Carelink monitor via interpreter. Monthly summary reports and ROV with WC PRN.

## 2017-05-25 ENCOUNTER — Telehealth: Payer: Self-pay | Admitting: Family Medicine

## 2017-05-25 NOTE — Telephone Encounter (Signed)
Received fax from [facility] for order, fax will be in the pcp in-box

## 2017-06-04 ENCOUNTER — Telehealth: Payer: Self-pay | Admitting: *Deleted

## 2017-06-04 NOTE — Telephone Encounter (Signed)
LMOVM for patient's son, Alecia LemmingVictor, requesting that patient send a manual Carelink transmission for review.  Gave [facility] phone number for questions/concerns.

## 2017-06-05 NOTE — Telephone Encounter (Signed)
Manual transmission received on 06/04/17.  2 "AF" episodes are false, ECGs show SR w/PACs.  ECGs printed and placed in Dr. Mickey Farberamnitz's LINQ folder for review.

## 2017-06-06 ENCOUNTER — Ambulatory Visit: Payer: Self-pay | Attending: Family Medicine | Admitting: Physician Assistant

## 2017-06-06 VITALS — BP 172/69 | HR 73 | Temp 98.3°F | Resp 16 | Wt 204.8 lb

## 2017-06-06 DIAGNOSIS — E785 Hyperlipidemia, unspecified: Secondary | ICD-10-CM | POA: Insufficient documentation

## 2017-06-06 DIAGNOSIS — Z7982 Long term (current) use of aspirin: Secondary | ICD-10-CM | POA: Insufficient documentation

## 2017-06-06 DIAGNOSIS — E860 Dehydration: Secondary | ICD-10-CM | POA: Insufficient documentation

## 2017-06-06 DIAGNOSIS — A084 Viral intestinal infection, unspecified: Secondary | ICD-10-CM | POA: Insufficient documentation

## 2017-06-06 DIAGNOSIS — Z79899 Other long term (current) drug therapy: Secondary | ICD-10-CM | POA: Insufficient documentation

## 2017-06-06 DIAGNOSIS — E1151 Type 2 diabetes mellitus with diabetic peripheral angiopathy without gangrene: Secondary | ICD-10-CM | POA: Insufficient documentation

## 2017-06-06 DIAGNOSIS — Z7902 Long term (current) use of antithrombotics/antiplatelets: Secondary | ICD-10-CM | POA: Insufficient documentation

## 2017-06-06 DIAGNOSIS — E1140 Type 2 diabetes mellitus with diabetic neuropathy, unspecified: Secondary | ICD-10-CM

## 2017-06-06 DIAGNOSIS — Z794 Long term (current) use of insulin: Secondary | ICD-10-CM | POA: Insufficient documentation

## 2017-06-06 DIAGNOSIS — Z8673 Personal history of transient ischemic attack (TIA), and cerebral infarction without residual deficits: Secondary | ICD-10-CM | POA: Insufficient documentation

## 2017-06-06 DIAGNOSIS — I10 Essential (primary) hypertension: Secondary | ICD-10-CM | POA: Insufficient documentation

## 2017-06-06 LAB — GLUCOSE, POCT (MANUAL RESULT ENTRY): POC GLUCOSE: 104 mg/dL — AB (ref 70–99)

## 2017-06-06 MED ORDER — SODIUM CHLORIDE 0.9 % IV BOLUS
1000.0000 mL | Freq: Once | INTRAVENOUS | Status: AC
  Administered 2017-06-06: 1000 mL via INTRAVENOUS

## 2017-06-06 NOTE — Progress Notes (Signed)
Patient ID: Meghan Ford, female   DOB: 1941/01/18, 77 y.o.   MRN: [id number]     Meghan Ford, is a 77 y.o. female  [id number]:[id number]  [id number]  DOB - 06-Jul-1940  Subjective:  Chief Complaint and HPI: Meghan Ford is a 77 y.o. female here today bc 4 days ago she had some vomiting and after the vomiting she had chills and fever.  She continued to have body aches.  Feels weak.  No diarrhea.  "Antonio" with [employer] translating.  No further vomiting.  She is eating a little and keeping liquids down.  No abdominal pain.  No diarrhea.  Blood sugars have been under 150.     ROS:   Constitutional:  No further f/c since sunday, No night sweats, No unexplained weight loss. EENT:  No vision changes, No blurry vision, No hearing changes. No mouth, throat, or ear problems.  Respiratory: No cough, No SOB Cardiac: No CP, no palpitations GI:  No abd pain, vomiting X 1 day-none since GU: No Urinary s/sx Musculoskeletal: No joint pain Neuro: No headache, no dizziness  Skin: No rash Endocrine:  No polydipsia. No polyuria.  Psych: Denies SI/HI  No problems updated.  ALLERGIES: No Known Allergies  PAST MEDICAL HISTORY: Past Medical History:  Diagnosis Date  . Diabetes mellitus type II, uncontrolled ([place])   . Diabetes mellitus without complication ([place])   . Fx humeral neck   . Hyperlipidemia LDL goal < 100   . Hypertension   . Leg pain    worse with prolonged standing  . Peripheral vascular disease ([place])   . Stroke [facility]) 2018; 05/08/2017   denies residual from 2018 stroke on 05/08/2017; weakness on left; speech issues from today's stroke (05/08/2017)  . Varicose veins     MEDICATIONS AT HOME: Prior to Admission medications   Medication Sig Start Date End Date Taking? Authorizing Provider  amLODipine (NORVASC) 10 MG tablet Take 1 tablet (10 mg total) by mouth daily. 05/14/17   Charlott Rakes, MD  aspirin 325 MG tablet Take 1 tablet (325  mg total) by mouth daily. 11/09/16   Hosie Poisson, MD  atorvastatin (LIPITOR) 80 MG tablet Take 1 tablet (80 mg total) by mouth daily. 05/14/17   Charlott Rakes, MD  baclofen (LIORESAL) 10 MG tablet Take 1 tablet (10 mg total) by mouth 2 (two) times daily. 05/14/17   Charlott Rakes, MD  blood glucose meter kit and supplies KIT Dispense based on patient and insurance preference. Use up to four times daily as directed. (FOR ICD-9 250.00, 250.01). 11/09/16   Hosie Poisson, MD  Blood Glucose Monitoring Suppl (TRUE METRIX METER) DEVI 1 each by Does not apply route 3 (three) times daily before meals. 05/14/17   Charlott Rakes, MD  Butalbital-APAP-Caffeine (FIORICET) 50-300-40 MG CAPS Take 1 tablet by mouth every 12 (twelve) hours as needed. 05/14/17   Charlott Rakes, MD  clopidogrel (PLAVIX) 75 MG tablet Take 1 tablet (75 mg total) by mouth daily. 05/14/17   Charlott Rakes, MD  gabapentin (NEURONTIN) 300 MG capsule Take 1 capsule (300 mg total) by mouth at bedtime. 05/14/17   Charlott Rakes, MD  glucose blood (TRUE METRIX BLOOD GLUCOSE TEST) test strip Use 3 times daily before meals 05/14/17   Charlott Rakes, MD  glucose blood test strip Use as instructed 11/09/16   Hosie Poisson, MD  insulin glargine (LANTUS) 100 UNIT/ML injection Inject 0.35 mLs (35 Units total) into the skin 2 (two) times daily. 05/14/17   Newlin, Charlane Ferretti,  MD  isosorbide mononitrate (IMDUR) 60 MG 24 hr tablet Take 1 tablet (60 mg total) by mouth daily. 05/14/17   Charlott Rakes, MD  senna-docusate (SENOKOT-S) 8.6-50 MG tablet Take 1 tablet by mouth at bedtime as needed for mild constipation. 05/09/17   Aline August, MD  TRUEPLUS LANCETS 28G MISC 1 each by Does not apply route 3 (three) times daily before meals. 05/14/17   Charlott Rakes, MD     Objective:  EXAM:   Vitals:   06/06/17 1528  BP: (!) 172/69  Pulse: 73  Resp: 16  Temp: 98.3 F (36.8 C)  TempSrc: Oral  SpO2: 96%  Weight: 204 lb 12.8 oz (92.9 kg)    General appearance :  A&OX3. NAD. Non-toxic-appearing, but does appear ill HEENT: Atraumatic and Normocephalic.  PERRLA. EOM intact.  TM clear B. Mouth-MM slightly dry, post pharynx WNL w/o erythema, No PND. Neck: supple, no JVD. No cervical lymphadenopathy. No thyromegaly Chest/Lungs:  Breathing-non-labored, Good air entry bilaterally, breath sounds normal without rales, rhonchi, or wheezing  CVS: S1 S2 regular, no murmurs, gallops, rubs  Abdominal exam unremarkable Extremities: Bilateral Lower Ext shows no edema, both legs are warm to touch with = pulse throughout Neurology:  CN II-XII grossly intact, Non focal.   Psych:  TP linear. J/I WNL. Normal speech. Appropriate eye contact and affect.  Skin:  No Rash  Data Review Lab Results  Component Value Date   HGBA1C 12.7 (H) 05/08/2017   HGBA1C 13.0 (H) 11/08/2016   HGBA1C 13.1 (H) 11/07/2016     Assessment & Plan   1. Viral gastroenteritis Resolving.  Graduate diet and liquids.   - sodium chloride 0.9 % bolus 1,000 mL  2. Dehydration - sodium chloride 0.9 % bolus 1,000 mL She felt much improved after IVF.    3. Type 2 diabetes mellitus with diabetic neuropathy, with long-term current use of insulin (HCC) Adequate control.  Continue current regimen - POCT glucose (manual entry)  4. Hypertension, unspecified type Not controlled today but likely bc ill.  Check BP OOO when well. We have discussed target BP range and blood pressure goal. I have advised patient to check BP regularly and to call us back or report to clinic if the numbers are consistently higher than 140/90. We discussed the importance of compliance with medical therapy and DASH diet recommended, consequences of uncontrolled hypertension discussed.   Patient have been counseled extensively about nutrition and exercise  Return for keep 08/14/2017 with Dr Margarita Rana; schedule sooner if needed..  The patient was given clear instructions to go to ER or return to medical center if symptoms don't  improve, worsen or new problems develop. The patient verbalized understanding. The patient was told to call to get lab results if they haven't heard anything in the next week.     Freeman Caldron, PA-C [facility] [place], [address]   06/06/2017, 4:14 PM

## 2017-06-08 ENCOUNTER — Ambulatory Visit (INDEPENDENT_AMBULATORY_CARE_PROVIDER_SITE_OTHER): Payer: Self-pay | Admitting: *Deleted

## 2017-06-08 DIAGNOSIS — I639 Cerebral infarction, unspecified: Secondary | ICD-10-CM

## 2017-06-11 NOTE — Progress Notes (Signed)
Carelink Summary Report / Loop Recorder 

## 2017-06-14 ENCOUNTER — Encounter (HOSPITAL_COMMUNITY): Payer: Self-pay

## 2017-06-14 ENCOUNTER — Inpatient Hospital Stay (HOSPITAL_COMMUNITY)
Admission: EM | Admit: 2017-06-14 | Discharge: 2017-06-19 | DRG: 872 | Disposition: A | Discharge status: Home / Self Care | Payer: Medicaid Other | Attending: Student in an Organized Health Care Education/Training Program | Admitting: Student in an Organized Health Care Education/Training Program

## 2017-06-14 ENCOUNTER — Emergency Department (HOSPITAL_COMMUNITY): Payer: Medicaid Other

## 2017-06-14 ENCOUNTER — Other Ambulatory Visit: Payer: Self-pay

## 2017-06-14 ENCOUNTER — Ambulatory Visit: Payer: MEDICAID | Admitting: Adult Health

## 2017-06-14 DIAGNOSIS — Z7982 Long term (current) use of aspirin: Secondary | ICD-10-CM | POA: Diagnosis not present

## 2017-06-14 DIAGNOSIS — Z79899 Other long term (current) drug therapy: Secondary | ICD-10-CM | POA: Diagnosis not present

## 2017-06-14 DIAGNOSIS — I878 Other specified disorders of veins: Secondary | ICD-10-CM

## 2017-06-14 DIAGNOSIS — R109 Unspecified abdominal pain: Secondary | ICD-10-CM | POA: Diagnosis not present

## 2017-06-14 DIAGNOSIS — Z96612 Presence of left artificial shoulder joint: Secondary | ICD-10-CM | POA: Diagnosis present

## 2017-06-14 DIAGNOSIS — N10 Acute tubulo-interstitial nephritis: Secondary | ICD-10-CM

## 2017-06-14 DIAGNOSIS — E1151 Type 2 diabetes mellitus with diabetic peripheral angiopathy without gangrene: Secondary | ICD-10-CM | POA: Diagnosis present

## 2017-06-14 DIAGNOSIS — J9811 Atelectasis: Secondary | ICD-10-CM | POA: Diagnosis present

## 2017-06-14 DIAGNOSIS — E872 Acidosis: Secondary | ICD-10-CM | POA: Diagnosis present

## 2017-06-14 DIAGNOSIS — N200 Calculus of kidney: Secondary | ICD-10-CM | POA: Diagnosis present

## 2017-06-14 DIAGNOSIS — Z7902 Long term (current) use of antithrombotics/antiplatelets: Secondary | ICD-10-CM | POA: Diagnosis not present

## 2017-06-14 DIAGNOSIS — K5900 Constipation, unspecified: Secondary | ICD-10-CM | POA: Diagnosis present

## 2017-06-14 DIAGNOSIS — Z794 Long term (current) use of insulin: Secondary | ICD-10-CM | POA: Diagnosis not present

## 2017-06-14 DIAGNOSIS — I10 Essential (primary) hypertension: Secondary | ICD-10-CM | POA: Diagnosis present

## 2017-06-14 DIAGNOSIS — E785 Hyperlipidemia, unspecified: Secondary | ICD-10-CM

## 2017-06-14 DIAGNOSIS — B964 Proteus (mirabilis) (morganii) as the cause of diseases classified elsewhere: Secondary | ICD-10-CM

## 2017-06-14 DIAGNOSIS — Z8673 Personal history of transient ischemic attack (TIA), and cerebral infarction without residual deficits: Secondary | ICD-10-CM

## 2017-06-14 DIAGNOSIS — A419 Sepsis, unspecified organism: Secondary | ICD-10-CM

## 2017-06-14 DIAGNOSIS — R1011 Right upper quadrant pain: Secondary | ICD-10-CM

## 2017-06-14 DIAGNOSIS — B9620 Unspecified Escherichia coli [E. coli] as the cause of diseases classified elsewhere: Secondary | ICD-10-CM

## 2017-06-14 DIAGNOSIS — E876 Hypokalemia: Secondary | ICD-10-CM | POA: Diagnosis present

## 2017-06-14 DIAGNOSIS — I6340 Cerebral infarction due to embolism of unspecified cerebral artery: Secondary | ICD-10-CM

## 2017-06-14 DIAGNOSIS — N12 Tubulo-interstitial nephritis, not specified as acute or chronic: Secondary | ICD-10-CM

## 2017-06-14 DIAGNOSIS — A4151 Sepsis due to Escherichia coli [E. coli]: Principal | ICD-10-CM | POA: Diagnosis present

## 2017-06-14 DIAGNOSIS — H547 Unspecified visual loss: Secondary | ICD-10-CM

## 2017-06-14 DIAGNOSIS — B001 Herpesviral vesicular dermatitis: Secondary | ICD-10-CM | POA: Diagnosis present

## 2017-06-14 DIAGNOSIS — IMO0001 Reserved for inherently not codable concepts without codable children: Secondary | ICD-10-CM

## 2017-06-14 DIAGNOSIS — R6520 Severe sepsis without septic shock: Secondary | ICD-10-CM | POA: Diagnosis present

## 2017-06-14 DIAGNOSIS — Z9114 Patient's other noncompliance with medication regimen: Secondary | ICD-10-CM | POA: Diagnosis not present

## 2017-06-14 DIAGNOSIS — E119 Type 2 diabetes mellitus without complications: Secondary | ICD-10-CM

## 2017-06-14 DIAGNOSIS — N136 Pyonephrosis: Secondary | ICD-10-CM | POA: Diagnosis present

## 2017-06-14 DIAGNOSIS — N132 Hydronephrosis with renal and ureteral calculous obstruction: Secondary | ICD-10-CM

## 2017-06-14 DIAGNOSIS — D649 Anemia, unspecified: Secondary | ICD-10-CM

## 2017-06-14 HISTORY — DX: Tubulo-interstitial nephritis, not specified as acute or chronic: N12

## 2017-06-14 HISTORY — DX: Sepsis, unspecified organism: A41.9

## 2017-06-14 LAB — COMPREHENSIVE METABOLIC PANEL
ALT: 18 U/L (ref 14–54)
AST: 35 U/L (ref 15–41)
Albumin: 3.1 g/dL — ABNORMAL LOW (ref 3.5–5.0)
Alkaline Phosphatase: 80 U/L (ref 38–126)
Anion gap: 12 (ref 5–15)
BUN: 23 mg/dL — ABNORMAL HIGH (ref 6–20)
CO2: 22 mmol/L (ref 22–32)
CREATININE: 1.53 mg/dL — AB (ref 0.44–1.00)
Calcium: 8.8 mg/dL — ABNORMAL LOW (ref 8.9–10.3)
Chloride: 105 mmol/L (ref 101–111)
GFR, EST AFRICAN AMERICAN: 38 mL/min — AB (ref >60)
GFR, EST NON AFRICAN AMERICAN: 32 mL/min — AB (ref >60)
Glucose, Bld: 123 mg/dL — ABNORMAL HIGH (ref 65–99)
Potassium: 3.4 mmol/L — ABNORMAL LOW (ref 3.5–5.1)
Sodium: 139 mmol/L (ref 135–145)
Total Bilirubin: 0.7 mg/dL (ref 0.3–1.2)
Total Protein: 6.6 g/dL (ref 6.5–8.1)

## 2017-06-14 LAB — URINALYSIS, ROUTINE W REFLEX MICROSCOPIC
Bilirubin Urine: NEGATIVE
Glucose, UA: NEGATIVE mg/dL
KETONES UR: NEGATIVE mg/dL
Nitrite: NEGATIVE
PROTEIN: 100 mg/dL — AB
SQUAMOUS EPITHELIAL / LPF: NONE SEEN
Specific Gravity, Urine: 1.016 (ref 1.005–1.030)
pH: 6 (ref 5.0–8.0)

## 2017-06-14 LAB — CBC WITH DIFFERENTIAL/PLATELET
BASOS PCT: 0 %
Band Neutrophils: 18 %
Basophils Absolute: 0 10*3/uL (ref 0.0–0.1)
Blasts: 0 %
Eosinophils Absolute: 0 10*3/uL (ref 0.0–0.7)
Eosinophils Relative: 0 %
HEMATOCRIT: 41.6 % (ref 36.0–46.0)
Hemoglobin: 13.4 g/dL (ref 12.0–15.0)
LYMPHS ABS: 0.2 10*3/uL — AB (ref 0.7–4.0)
Lymphocytes Relative: 1 %
MCH: 26.2 pg (ref 26.0–34.0)
MCHC: 32.2 g/dL (ref 30.0–36.0)
MCV: 81.4 fL (ref 78.0–100.0)
MYELOCYTES: 0 %
Metamyelocytes Relative: 0 %
Monocytes Absolute: 0.2 10*3/uL (ref 0.1–1.0)
Monocytes Relative: 1 %
NEUTROS PCT: 80 %
NRBC: 0 /100{WBCs}
Neutro Abs: 18.1 10*3/uL — ABNORMAL HIGH (ref 1.7–7.7)
OTHER: 0 %
PLATELETS: 281 10*3/uL (ref 150–400)
PROMYELOCYTES ABS: 0 %
RBC: 5.11 MIL/uL (ref 3.87–5.11)
RDW: 15.2 % (ref 11.5–15.5)
WBC Morphology: INCREASED
WBC: 18.5 10*3/uL — ABNORMAL HIGH (ref 4.0–10.5)

## 2017-06-14 LAB — I-STAT CG4 LACTIC ACID, ED
LACTIC ACID, VENOUS: 3.16 mmol/L — AB (ref 0.5–1.9)
Lactic Acid, Venous: 4.45 mmol/L (ref 0.5–1.9)

## 2017-06-14 LAB — I-STAT CHEM 8, ED
BUN: 24 mg/dL — ABNORMAL HIGH (ref 6–20)
CALCIUM ION: 1.16 mmol/L (ref 1.15–1.40)
Chloride: 106 mmol/L (ref 101–111)
Creatinine, Ser: 1.4 mg/dL — ABNORMAL HIGH (ref 0.44–1.00)
Glucose, Bld: 124 mg/dL — ABNORMAL HIGH (ref 65–99)
HCT: 43 % (ref 36.0–46.0)
HEMOGLOBIN: 14.6 g/dL (ref 12.0–15.0)
Potassium: 3.5 mmol/L (ref 3.5–5.1)
Sodium: 143 mmol/L (ref 135–145)
TCO2: 22 mmol/L (ref 22–32)

## 2017-06-14 LAB — I-STAT TROPONIN, ED: TROPONIN I, POC: 0 ng/mL (ref 0.00–0.08)

## 2017-06-14 LAB — CBG MONITORING, ED
GLUCOSE-CAPILLARY: 136 mg/dL — AB (ref 65–99)
Glucose-Capillary: 88 mg/dL (ref 65–99)

## 2017-06-14 LAB — GLUCOSE, CAPILLARY
GLUCOSE-CAPILLARY: 86 mg/dL (ref 65–99)
GLUCOSE-CAPILLARY: 76 mg/dL (ref 65–99)

## 2017-06-14 LAB — MRSA PCR SCREENING: MRSA BY PCR: NEGATIVE

## 2017-06-14 MED ORDER — ACETAMINOPHEN 650 MG RE SUPP
650.0000 mg | Freq: Once | RECTAL | Status: AC
  Administered 2017-06-14: 650 mg via RECTAL
  Filled 2017-06-14: qty 1

## 2017-06-14 MED ORDER — SODIUM CHLORIDE 0.9 % IV SOLN
INTRAVENOUS | Status: DC
  Administered 2017-06-14 – 2017-06-16 (×4): via INTRAVENOUS

## 2017-06-14 MED ORDER — SODIUM CHLORIDE 0.9 % IV BOLUS (SEPSIS)
1000.0000 mL | Freq: Once | INTRAVENOUS | Status: AC
  Administered 2017-06-14: 1000 mL via INTRAVENOUS

## 2017-06-14 MED ORDER — VANCOMYCIN HCL 10 G IV SOLR
1750.0000 mg | Freq: Once | INTRAVENOUS | Status: AC
  Administered 2017-06-14: 1750 mg via INTRAVENOUS
  Filled 2017-06-14: qty 1750

## 2017-06-14 MED ORDER — PIPERACILLIN-TAZOBACTAM 3.375 G IVPB: 3.3750 g | Freq: Three times a day (TID) | INTRAVENOUS | Status: DC

## 2017-06-14 MED ORDER — ASPIRIN 325 MG PO TABS
325.0000 mg | ORAL_TABLET | Freq: Every day | ORAL | Status: DC
  Administered 2017-06-15 – 2017-06-19 (×5): 325 mg via ORAL
  Filled 2017-06-14 (×6): qty 1

## 2017-06-14 MED ORDER — ATORVASTATIN CALCIUM 80 MG PO TABS
80.0000 mg | ORAL_TABLET | Freq: Every day | ORAL | Status: DC
  Administered 2017-06-15 – 2017-06-19 (×5): 80 mg via ORAL
  Filled 2017-06-14 (×5): qty 1

## 2017-06-14 MED ORDER — HEPARIN SODIUM (PORCINE) 5000 UNIT/ML IJ SOLN
5000.0000 [IU] | Freq: Three times a day (TID) | INTRAMUSCULAR | Status: DC
Start: 2017-06-14 — End: 2017-06-19
  Administered 2017-06-14 – 2017-06-19 (×15): 5000 [IU] via SUBCUTANEOUS
  Filled 2017-06-14 (×15): qty 1

## 2017-06-14 MED ORDER — VANCOMYCIN HCL 10 G IV SOLR: 1250.0000 mg | INTRAVENOUS | Status: DC

## 2017-06-14 MED ORDER — CLOPIDOGREL BISULFATE 75 MG PO TABS
75.0000 mg | ORAL_TABLET | Freq: Every day | ORAL | Status: DC
  Administered 2017-06-15 – 2017-06-19 (×5): 75 mg via ORAL
  Filled 2017-06-14 (×5): qty 1

## 2017-06-14 MED ORDER — ISOSORBIDE MONONITRATE ER 60 MG PO TB24: 60.0000 mg | ORAL_TABLET | Freq: Every day | ORAL | Status: DC

## 2017-06-14 MED ORDER — SODIUM CHLORIDE 0.9 % IV SOLN
1.0000 g | INTRAVENOUS | Status: DC
  Administered 2017-06-14: 1 g via INTRAVENOUS
  Filled 2017-06-14 (×2): qty 10

## 2017-06-14 MED ORDER — VANCOMYCIN HCL IN DEXTROSE 1-5 GM/200ML-% IV SOLN: 1000.0000 mg | Freq: Once | INTRAVENOUS | Status: DC

## 2017-06-14 MED ORDER — PIPERACILLIN-TAZOBACTAM 3.375 G IVPB 30 MIN
3.3750 g | Freq: Once | INTRAVENOUS | Status: AC
  Administered 2017-06-14: 3.375 g via INTRAVENOUS
  Filled 2017-06-14: qty 50

## 2017-06-14 NOTE — Progress Notes (Signed)
Angelena FormMarcelina Escobar Guevara [id number] Admission Data: 06/14/2017 6:18 PM Attending Provider: Tyson AliasVincent, Duncan Thomas, *  JXB:[id number]:Newlin, Odette HornsEnobong, MD Consults/ Treatment Team: Treatment Team:  Weyman Croon[place], [facility], MD  Velda ShellMarcelina Escobar Alene MiresGuevara is a 77 y.o. female patient admitted from ED awake, alert  & orientated  X 3,  Full Code, VSS - Blood pressure (!) 122/55, pulse 86, temperature 99.5 F (37.5 C), temperature source Axillary, resp. rate (!) 23, height 5\' 1"  (1.549 m), weight 95.7 kg (210 lb 15.7 oz), SpO2 94 %., O2 , no c/o shortness of breath, no c/o chest pain, no distress noted.patient placed on progessive care monitor.   IV site WDL:  Forearm Leftt, condition patent and no redness and left, condition patent and no redness and antecubital right, condition patent and no redness with a transparent dsg that's clean dry and intact.  Allergies:  No Known Allergies   Past Medical History:  Diagnosis Date  . Diabetes mellitus type II, uncontrolled (HCC)   . Diabetes mellitus without complication (HCC)   . Fx humeral neck   . Hyperlipidemia LDL goal < 100   . Hypertension   . Leg pain    worse with prolonged standing  . Peripheral vascular disease (HCC)   . Stroke [facility](HCC) 2018; 05/08/2017   denies residual from 2018 stroke on 05/08/2017; weakness on left; speech issues from today's stroke (05/08/2017)  . Varicose veins     History:  obtained from Family and the patient.  Pt orientation to unit, room and routine. Information packet given to patient/family.  Admission INP armband ID verified with patient/family, and in place. SR up x 2, fall risk assessment complete with Patient and family verbalizing understanding of risks associated with falls. Pt verbalizes an understanding of how to use the call bell and to call for help before getting out of bed.  Skin, clean-dry- intact without evidence of bruising, or skin tears.   No evidence of skin break down noted on exam. no rashes, no  ecchymoses, no petechiae    Will cont to monitor and assist as needed.  Raigan Baria Consuella Loselaine, RN 06/14/2017 6:18 PM

## 2017-06-14 NOTE — ED Provider Notes (Addendum)
[facility] EMERGENCY DEPARTMENT Provider Note   CSN: [id number] Arrival date & time: 06/14/17  7353     History   Chief Complaint Chief Complaint  Patient presents with  . Code Sepsis    HPI Meghan Ford is a 77 y.o. female.  HPI   77 year old morbidly obese female with history of diabetes and hypertension and hyperlipidemia here with unresponsiveness.  The patient reportedly was previously ill approximately 2 weeks ago, she was seen by her PCP and diagnosed with viral illness.  She had some improvement.  However, over the last 2 days, patient has been increasingly confused and drowsy.  She is been vomiting.  She is had generalized weakness.  Patient's daughter states she found her today and was incredibly confused and unable to answer questions.  She was subsequent brought to the ED.  On my assessment, patient endorses pain in her abdomen as well as nausea and vomiting.  Denies any cough.  Denies any sputum production.  She is confused, however, and easily falls asleep.  Level 5 caveat invoked as remainder of history, ROS, and physical exam limited due to patient's confusion.   Past Medical History:  Diagnosis Date  . Diabetes mellitus type II, uncontrolled ([place])   . Diabetes mellitus without complication ([place])   . Fx humeral neck   . Hyperlipidemia LDL goal < 100   . Hypertension   . Leg pain    worse with prolonged standing  . Peripheral vascular disease ([place])   . Stroke [facility]) 2018; 05/08/2017   denies residual from 2018 stroke on 05/08/2017; weakness on left; speech issues from today's stroke (05/08/2017)  . Varicose veins     Patient Active Problem List   Diagnosis Date Noted  . History of CVA (cerebrovascular accident) 05/07/2017  . Stroke ([place]) 05/07/2017  . Intracranial vascular stenosis   . TIA (transient ischemic attack) 11/07/2016  . Insulin dependent type 2 diabetes mellitus, uncontrolled ([place]) 11/07/2016  . HLD (hyperlipidemia)  11/07/2016  . HTN (hypertension) 11/07/2016  . 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 Noncompliance with diet and medication regimen 08/26/2015  . S/p reverse total shoulder arthroplasty   . Leukocytosis   . Proximal humerus fracture 10/26/2014  . Hyperlipidemia 10/23/2014  . Hypokalemia 10/09/2014  . Essential hypertension 10/09/2014  . Protein-calorie malnutrition, severe ([place]) 09/29/2014  . Sepsis ([place]) 09/28/2014  . Fever 09/28/2014  . Headache 09/28/2014  . Diabetes mellitus with neuropathy ([place])   . Pyrexia   . Uncontrolled hypertension 10/15/2012  . Neuropathic pain of both legs 10/15/2012  . Palpitations 05/21/2012  . Dyslipidemia 05/15/2012  . Hypertension   . Hyperlipidemia LDL goal < 100   . Diabetes mellitus type II, uncontrolled ([place])   . Varicose veins of lower extremities with other complications 29/92/4268    Past Surgical History:  Procedure Laterality Date  . FRACTURE SURGERY    . LOOP RECORDER INSERTION N/A 05/09/2017   Procedure: LOOP RECORDER INSERTION;  Surgeon: Constance Haw, MD;  Location: [facility];  Service: Cardiovascular;  Laterality: N/A;  . REVERSE SHOULDER ARTHROPLASTY Left 10/27/2014   Procedure: REVERSE SHOULDER ARTHROPLASTY;  Surgeon: Renette Butters, MD;  Location: [facility];  Service: Orthopedics;  Laterality: Left;  . TEE WITHOUT CARDIOVERSION N/A 05/09/2017   Procedure: TRANSESOPHAGEAL ECHOCARDIOGRAM (TEE);  Surgeon: Jerline Pain, MD;  Location: [facility] ENDOSCOPY;  Service: Cardiovascular;  Laterality: N/A;  . TOTAL SHOULDER ARTHROPLASTY Left 10/27/2014  Procedure: TOTAL SHOULDER ARTHROPLASTY;  Surgeon: Renette Butters, MD;  Location: [facility];  Service: Orthopedics;  Laterality: Left;  Marland Kitchen VEIN SURGERY Left    left leg     OB History    Gravida  0   Para  0   Term  0   Preterm  0   AB  0   Living        SAB  0   TAB  0   Ectopic  0   Multiple      Live Births                 Home Medications    Prior to Admission medications   Medication Sig Start Date End Date Taking? Authorizing Provider  amLODipine (NORVASC) 10 MG tablet Take 1 tablet (10 mg total) by mouth daily. 05/14/17  Yes Charlott Rakes, MD  aspirin 325 MG tablet Take 1 tablet (325 mg total) by mouth daily. 11/09/16  Yes Hosie Poisson, MD  atorvastatin (LIPITOR) 80 MG tablet Take 1 tablet (80 mg total) by mouth daily. 05/14/17  Yes Charlott Rakes, MD  baclofen (LIORESAL) 10 MG tablet Take 1 tablet (10 mg total) by mouth 2 (two) times daily. 05/14/17  Yes Charlott Rakes, MD  blood glucose meter kit and supplies KIT Dispense based on patient and insurance preference. Use up to four times daily as directed. (FOR ICD-9 250.00, 250.01). 11/09/16  Yes Hosie Poisson, MD  Blood Glucose Monitoring Suppl (TRUE METRIX METER) DEVI 1 each by Does not apply route 3 (three) times daily before meals. 05/14/17  Yes Newlin, Charlane Ferretti, MD  Butalbital-APAP-Caffeine (FIORICET) 50-300-40 MG CAPS Take 1 tablet by mouth every 12 (twelve) hours as needed. Patient taking differently: Take 1 tablet by mouth every 12 (twelve) hours as needed (pain).  05/14/17  Yes Charlott Rakes, MD  clopidogrel (PLAVIX) 75 MG tablet Take 1 tablet (75 mg total) by mouth daily. 05/14/17  Yes Charlott Rakes, MD  gabapentin (NEURONTIN) 300 MG capsule Take 1 capsule (300 mg total) by mouth at bedtime. 05/14/17  Yes Charlott Rakes, MD  glucose blood (TRUE METRIX BLOOD GLUCOSE TEST) test strip Use 3 times daily before meals 05/14/17  Yes Newlin, Enobong, MD  glucose blood test strip Use as instructed 11/09/16  Yes Hosie Poisson, MD  ibuprofen (ADVIL,MOTRIN) 200 MG tablet Take 200 mg by mouth every 6 (six) hours as needed for moderate pain.   Yes [provider]  insulin glargine (LANTUS) 100 UNIT/ML injection Inject 0.35 mLs (35 Units total) into the skin 2 (two) times daily. 05/14/17  Yes Charlott Rakes, MD  isosorbide mononitrate (IMDUR) 60 MG 24  hr tablet Take 1 tablet (60 mg total) by mouth daily. 05/14/17  Yes Newlin, Charlane Ferretti, MD  senna-docusate (SENOKOT-S) 8.6-50 MG tablet Take 1 tablet by mouth at bedtime as needed for mild constipation. 05/09/17  Yes Aline August, MD  TRUEPLUS LANCETS 28G MISC 1 each by Does not apply route 3 (three) times daily before meals. 05/14/17  Yes Charlott Rakes, MD    Family History Family History  Problem Relation Age of Onset  . Cancer Brother   . Heart disease Sister     Social History Social History   Tobacco Use  . Smoking status: Never Smoker  . Smokeless tobacco: Never Used  Substance Use Topics  . Alcohol use: No  . Drug use: No     Allergies   Patient has no known allergies.   Review of Systems Review  of Systems  Unable to perform ROS: Mental status change  Constitutional: Positive for fever.  Gastrointestinal: Positive for abdominal pain, nausea and vomiting.  Neurological: Positive for weakness.  All other systems reviewed and are negative.    Physical Exam Updated Vital Signs BP 110/87 (BP Location: Right Arm)   Pulse 87   Temp (!) 103.7 F (39.8 C) (Rectal)   Resp (!) 30   Ht '5\' 1"'$  (1.549 m)   Wt 95.3 kg (210 lb)   SpO2 98%   BMI 39.68 kg/m   Physical Exam  Constitutional: She appears well-developed. She appears toxic. She appears ill. She appears distressed.  HENT:  Head: Normocephalic and atraumatic.  Dry mucous membranes  Eyes: Conjunctivae are normal.  Neck: Neck supple.  Cardiovascular: Regular rhythm and normal heart sounds. Exam reveals no friction rub.  No murmur heard. Tachycardia  Pulmonary/Chest: Effort normal and breath sounds normal. No respiratory distress. She has no wheezes. She has no rales.  Abdominal: Soft. She exhibits no distension. There is tenderness (Moderate tenderness throughout abdomen, worse in left side).  Musculoskeletal: She exhibits no edema.  Neurological: She is alert. She exhibits normal muscle tone.  Oriented to  person and place, but not time.  Moves all extremities.  Face symmetric.  Speech appears normal.  Skin: Skin is warm. Capillary refill takes 2 to 3 seconds.  Nursing note and vitals reviewed.    ED Treatments / Results  Labs (all labs ordered are listed, but only abnormal results are displayed) Labs Reviewed  COMPREHENSIVE METABOLIC PANEL - Abnormal; Notable for the following components:      Result Value   Potassium 3.4 (*)    Glucose, Bld 123 (*)    BUN 23 (*)    Creatinine, Ser 1.53 (*)    Calcium 8.8 (*)    Albumin 3.1 (*)    GFR calc non Af Amer 32 (*)    GFR calc Af Amer 38 (*)    All other components within normal limits  CBC WITH DIFFERENTIAL/PLATELET - Abnormal; Notable for the following components:   WBC 18.5 (*)    Neutro Abs 18.1 (*)    Lymphs Abs 0.2 (*)    All other components within normal limits  URINALYSIS, ROUTINE W REFLEX MICROSCOPIC - Abnormal; Notable for the following components:   APPearance CLOUDY (*)    Hgb urine dipstick MODERATE (*)    Protein, ur 100 (*)    Leukocytes, UA LARGE (*)    Bacteria, UA MANY (*)    All other components within normal limits  I-STAT CG4 LACTIC ACID, ED - Abnormal; Notable for the following components:   Lactic Acid, Venous 4.45 (*)    All other components within normal limits  I-STAT CHEM 8, ED - Abnormal; Notable for the following components:   BUN 24 (*)    Creatinine, Ser 1.40 (*)    Glucose, Bld 124 (*)    All other components within normal limits  CBG MONITORING, ED - Abnormal; Notable for the following components:   Glucose-Capillary 136 (*)    All other components within normal limits  CULTURE, BLOOD (ROUTINE X 2)  CULTURE, BLOOD (ROUTINE X 2)  URINE CULTURE  I-STAT TROPONIN, ED  I-STAT CG4 LACTIC ACID, ED  I-STAT CG4 LACTIC ACID, ED    EKG EKG Interpretation  Date/Time:  Thursday June 14 2017 07:24:10 EDT Ventricular Rate:  133 PR Interval:    QRS Duration: 91 QT Interval:  310 QTC  Calculation: 462  R Axis:   59 Text Interpretation:  Junctional tachycardia Borderline repolarization abnormality Since last EKG, junctional tachycardia suggested more than sinus rhythm Although rate has increased No ST-t changes Confirmed by Duffy Bruce [phone]) on 06/14/2017 7:34:22 AM   Radiology Ct Abdomen Pelvis Wo Contrast  Result Date: 06/14/2017 CLINICAL DATA:  Fever, nausea, vomiting. Left lower quadrant abdominal pain. Abdominal distention. EXAM: CT ABDOMEN AND PELVIS WITHOUT CONTRAST TECHNIQUE: Multidetector CT imaging of the abdomen and pelvis was performed following the standard protocol without IV contrast. COMPARISON:  02/09/2010 FINDINGS: Lower chest: Cardiomegaly. Mild dependent atelectasis in both lower lobes and along the left hemidiaphragm. Mild mitral valve calcification. Small type 1 hiatal hernia. Hepatobiliary: Unremarkable Pancreas: Unremarkable Spleen: Unremarkable Adrenals/Urinary Tract: Adrenal glands normal. Dilated left renal collecting system with perirenal stranding, probable wall thickening in the renal pelvis, and mild left hydroureter extending down to the iliac vessel cross over. There is a 2.4 by 1.0 cm left renal pelvic calculus on image 55/5. Currently this is not obstructing the UPJ. No additional urinary tract calculi are noted. There is minimal chronic right perirenal stranding. 8 mm exophytic hypodense lesion of the right mid upper kidney due to small size and lack of IV contrast. Stomach/Bowel: Sigmoid colon diverticulosis. Minimal stranding along some of the margins of the descending colon is most attributable to the perirenal stranding extending into adjacent retroperitoneal spaces rather than active diverticulitis. Vascular/Lymphatic: Aortoiliac atherosclerotic vascular disease. A left external iliac node measures 0.9 cm in short axis on image 66/3, previously 0.8 cm. No overtly pathologic adenopathy is identified. Reproductive: Unremarkable Other: No  supplemental non-categorized findings. Musculoskeletal: Grade 1 degenerative anterolisthesis at L4-5. Lower thoracic bridging spurring. Suspected right foraminal pinch bent at L3-4 and L5-S1 with left foraminal impingement at L4-5 due to spurring and degenerative disc disease. IMPRESSION: 1. 2.4 by 1.0 cm stone in the left renal pelvis. Although this does not appear currently obstructive, there is notable renal pelvis wall thickening, perirenal stranding, and distention of the renal pelvis. This stone could be intermittently obstructing to cause this kind of appearance. IV contrast was not administered. 2. Nonspecific 8 mm exophytic lesion from the right mid upper kidney anteriorly, technically too small to characterize. 3. Other imaging findings of potential clinical significance: Aortic Atherosclerosis (ICD10-I70.0). Cardiomegaly. Mild mitral valve calcification. Small type 1 hiatal hernia. Mild dependent atelectasis in the lung bases. Sigmoid colon diverticulosis. Lower lumbar foraminal impingement due to spondylosis and degenerative disc disease. Electronically Signed   By: Van Clines M.D.   On: 06/14/2017 09:12   Dg Chest Port 1 View  Result Date: 06/14/2017 CLINICAL DATA:  Sepsis, history hypertension, diabetes mellitus, stroke EXAM: PORTABLE CHEST 1 VIEW COMPARISON:  Portable exam 0726 hours compared to 10/27/2014 FINDINGS: Enlargement of cardiac silhouette with pulmonary vascular congestion. Atherosclerotic calcification aorta. Severely kyphotic positioning. Bronchitic changes with bibasilar atelectasis. No acute infiltrate, pleural effusion or pneumothorax. Bones demineralized with prior reverse LEFT shoulder arthroplasty. IMPRESSION: Enlargement of cardiac silhouette with vascular congestion. Bronchitic changes with bibasilar atelectasis. Electronically Signed   By: Lavonia Dana M.D.   On: 06/14/2017 08:21    Procedures .Critical Care Performed by: Duffy Bruce, MD Authorized by: Duffy Bruce, MD   Critical care provider statement:    Critical care time (minutes):  55   Critical care time was exclusive of:  Separately billable procedures and treating other patients and teaching time   Critical care was necessary to treat or prevent imminent or life-threatening deterioration of the following conditions:  Sepsis, circulatory failure and cardiac failure   Critical care was time spent personally by me on the following activities:  Development of treatment plan with patient or surrogate, discussions with consultants, evaluation of patient's response to treatment, examination of patient, obtaining history from patient or surrogate, ordering and performing treatments and interventions, ordering and review of laboratory studies, ordering and review of radiographic studies, pulse oximetry, re-evaluation of patient's condition and review of old charts   I assumed direction of critical care for this patient from another provider in my specialty: no     (including critical care time)  Medications Ordered in ED Medications  piperacillin-tazobactam (ZOSYN) IVPB 3.375 g (has no administration in time range)  vancomycin (VANCOCIN) 1,250 mg in sodium chloride 0.9 % 250 mL IVPB (has no administration in time range)  sodium chloride 0.9 % bolus 1,000 mL (0 mLs Intravenous Stopped 06/14/17 0806)    And  sodium chloride 0.9 % bolus 1,000 mL (0 mLs Intravenous Stopped 06/14/17 0930)    And  sodium chloride 0.9 % bolus 1,000 mL (0 mLs Intravenous Stopped 06/14/17 1040)  piperacillin-tazobactam (ZOSYN) IVPB 3.375 g (0 g Intravenous Stopped 06/14/17 0806)  acetaminophen (TYLENOL) suppository 650 mg (650 mg Rectal Given 06/14/17 0737)  vancomycin (VANCOCIN) 1,750 mg in sodium chloride 0.9 % 500 mL IVPB (0 mg Intravenous Stopped 06/14/17 1040)     Initial Impression / Assessment and Plan / ED Course  I have reviewed the triage vital signs and the nursing notes.  Pertinent labs & imaging results that were  available during my care of the patient were reviewed by me and considered in my medical decision making (see chart for details).  Clinical Course as of Jun 14 1316  Thu Jun 14, 2017  0738 76 yo F here with concern for septic shock/severe sepsis 2/2 unknown infection. Suspect UTI or intra-abd source. Pt protecting airway at this time. Will start broad-spectrum ABX, fluids, and obtain UA and CT. Non-con ordered 2/2 h/o CKD. Family updated and in agreement. Lungs CTAB.   [CI]  0739 30 cc/kg ordered, with IV ABX  I-Stat CG4 Lactic Acid, ED  (not at  [facility])(!!) [CI]  0815 Blood pressure significantly improving.  Heart rate down to the low 100s.   [CI]  [id number] Lab work shows leukocytosis and significant lactic acidosis, consistent with her sepsis.  UA is consistent with UTI.  Will follow up remainder of imaging.  Patient mentation and repeat exam shows significant improvement in her overall appearance of perfusion.   [CI]  6387 CT shows large, possibly intermittently obstructing stone. In setting of UTI, will d/w Urology. Pt appears much improved. Sepsis re-eval completed - cap refill <2 sec, mentation improved, BP improve. Unfortunately unable to obtain repeat LA 2/2 hard stick, pt refusing additional sticks. All other signs of perfusion improving. Will repeat after more fluids. ELINK aware.  [CI]    Clinical Course User Index [CI] Duffy Bruce, MD      Final Clinical Impressions(s) / ED Diagnoses   Final diagnoses:  Severe sepsis [facility])  Pyelonephritis  Renal stone    ED Discharge Orders    None       Duffy Bruce, MD 06/14/17 1320    Duffy Bruce, MD 07/02/17 1534

## 2017-06-14 NOTE — ED Notes (Addendum)
Internal Medicine Providers at bedside.

## 2017-06-14 NOTE — ED Notes (Signed)
Portable x-ray at beside

## 2017-06-14 NOTE — ED Notes (Signed)
Interventional Radiology PA at bedside.

## 2017-06-14 NOTE — ED Triage Notes (Signed)
Pt presents to ED with daughter minimally responsive and was removed from vehicle by staff. Daughter not at bedside at this time and pt not answering questions.

## 2017-06-14 NOTE — ED Notes (Signed)
Critical Lactic Acid results reported to Issacs, MD

## 2017-06-14 NOTE — ED Notes (Signed)
Patient transported to CT 

## 2017-06-14 NOTE — ED Notes (Signed)
ED provider notified of the difficulty of staff getting the I stat lactic that was due. EDP will chart on this occurrence.

## 2017-06-14 NOTE — ED Notes (Signed)
Pt speaking to Dr. Erma HeritageIsaacs at this time. Pt reports fever, nausea, vomiting, LLQ abdominal pain since last night.

## 2017-06-14 NOTE — H&P (Addendum)
Date: 06/14/2017               Patient Name:  Meghan Ford MRN: [id number]  DOB: 08-02-1940 Age / Sex: 77 y.o., female   PCP: Charlott Rakes, MD         Medical Service: Internal Medicine Teaching Service         Attending Physician: Dr. Evette Doffing, Mallie Mussel, *    First Contact: Dr. Berline Lopes Pager: [phone]  Second Contact: Dr. Hetty Ely Pager: [phone]       After Hours (After 5p/  First Contact Pager: [phone]  weekends / holidays): Second Contact Pager: [phone]   Chief Complaint: Somnolence   History of Present Illness: An interpreter service was used in the translation of the H&P.   Nolon Rod is a 77 yo F P.m. H of insulin-dependent diabetes, HLD, HTN, and previous CVA with notable peripheral vascular disease who presents for 3 days of somnolence as per the patient's daughter. The patient was in her usual state of health approximately 5 days prior with the exception of progressively worsening vision loss for the past 3 months.  Late the evening prior to her admission she experienced significant left-sided flank pain, and increasing heaviness of the head which is accompanied by fever, chills, diaphoresis and constipation.  In addition she has experienced several episodes of vomiting yellow emesis absent frank blood.  Advil did not relieve the left flank pain and her symptoms failed to improve which prompted her daughter to deliver her to the ED.  As per daughter, this happened approximately 3 weeks prior last for 3-4 days and was described as being particularly  similar nature to her son did not bring her to the hospital but resolved spontaneously as she refused transport.  At baseline the patient is able to perform her ADL's, does not drive at baseline.   She denied hematemesis, hematuria, chest pain, back pain, myalgias, urinary frequency, pain with defecation or urination, dyspnea, or cough.  In the ED, initial CMP indicated sodium 139, potassium 3.4, creatinine of  1.53 and albumin of 3.1.  CBC indicated a leukocytosis of 18.5 thousand, stat troponin was negative and was notable lactic acidosis of 4.45.  UA was positive for bacteria, leukocytes, hemoglobin and moderate negative for nitrites.  Blood cultures were taken and the patient was started on vancomycin and Zosyn with ceftriaxone added subsequently.  Chest x-ray failed to demonstrate an acute cardiopulmonary process with mild enlargement the cardiac silhouette, vascular congestion and mild bronchitic changes with bibasilar atelectasis.  The notable finding concern a CT of the abdomen and pelvis which found a 2.4 x 1 cm left-sided renal stone with mild hydronephrosis which in association with the UA, flank pain, fever, leukocytosis, hypotension and lactic acidosis concerning for urosepsis.  Meds:  Current Meds  Medication Sig  . amLODipine (NORVASC) 10 MG tablet Take 1 tablet (10 mg total) by mouth daily.  Marland Kitchen aspirin 325 MG tablet Take 1 tablet (325 mg total) by mouth daily.  Marland Kitchen atorvastatin (LIPITOR) 80 MG tablet Take 1 tablet (80 mg total) by mouth daily.  . baclofen (LIORESAL) 10 MG tablet Take 1 tablet (10 mg total) by mouth 2 (two) times daily.  . blood glucose meter kit and supplies KIT Dispense based on patient and insurance preference. Use up to four times daily as directed. (FOR ICD-9 250.00, 250.01).  . Blood Glucose Monitoring Suppl (TRUE METRIX METER) DEVI 1 each by Does not apply route 3 (three) times daily before meals.  Marland Kitchen  Butalbital-APAP-Caffeine (FIORICET) 50-300-40 MG CAPS Take 1 tablet by mouth every 12 (twelve) hours as needed. (Patient taking differently: Take 1 tablet by mouth every 12 (twelve) hours as needed (pain). )  . clopidogrel (PLAVIX) 75 MG tablet Take 1 tablet (75 mg total) by mouth daily.  Marland Kitchen gabapentin (NEURONTIN) 300 MG capsule Take 1 capsule (300 mg total) by mouth at bedtime.  Marland Kitchen glucose blood (TRUE METRIX BLOOD GLUCOSE TEST) test strip Use 3 times daily before meals  .  glucose blood test strip Use as instructed  . ibuprofen (ADVIL,MOTRIN) 200 MG tablet Take 200 mg by mouth every 6 (six) hours as needed for moderate pain.  Marland Kitchen insulin glargine (LANTUS) 100 UNIT/ML injection Inject 0.35 mLs (35 Units total) into the skin 2 (two) times daily.  . isosorbide mononitrate (IMDUR) 60 MG 24 hr tablet Take 1 tablet (60 mg total) by mouth daily.  Marland Kitchen senna-docusate (SENOKOT-S) 8.6-50 MG tablet Take 1 tablet by mouth at bedtime as needed for mild constipation.  . TRUEPLUS LANCETS 28G MISC 1 each by Does not apply route 3 (three) times daily before meals.   Allergies: Allergies as of 06/14/2017  . (No Known Allergies)   Past Medical History:  Diagnosis Date  . Diabetes mellitus type II, uncontrolled ([place])   . Diabetes mellitus without complication ([place])   . Fx humeral neck   . Hyperlipidemia LDL goal < 100   . Hypertension   . Leg pain    worse with prolonged standing  . Peripheral vascular disease ([place])   . Stroke [facility]) 2018; 05/08/2017   denies residual from 2018 stroke on 05/08/2017; weakness on left; speech issues from today's stroke (05/08/2017)  . Varicose veins    Family History:  Denied other Mother cardiac history unspecified  Social History:  Denied Tobacco, EtOH, Illicit drugs  Review of Systems: A complete ROS was negative except as per HPI.   Physical Exam: Blood pressure 125/60, pulse 86, temperature 99.5 F (37.5 C), temperature source Axillary, resp. rate (!) 21, height '5\' 1"'$  (1.549 m), weight 210 lb (95.3 kg), SpO2 94 %. Physical Exam  Constitutional: She appears well-developed and well-nourished. She appears lethargic. She appears distressed.  HENT:  Head: Normocephalic.  Eyes: Pupils are equal, round, and reactive to light. EOM are normal. Right eye exhibits no discharge. Left eye exhibits no discharge.  Neck: JVD (6-7cm) present.  Cardiovascular: Normal rate and regular rhythm.  No murmur heard. Pulmonary/Chest: Effort normal.  Tachypnea noted. No respiratory distress. She has no wheezes.  Abdominal: Soft. Bowel sounds are normal. She exhibits no distension. There is tenderness (Of the left flank).  Musculoskeletal: She exhibits no edema, tenderness or deformity.  Neurological: She appears lethargic.  Skin: She is diaphoretic.  Nursing note and vitals reviewed.  EKG: personally reviewed my interpretation is junctional tachycardia  CXR: personally reviewed my interpretation is mildly enlarged cardiac silhouette with vascular congestion and bibasilar atelectasis  Assessment & Plan by Problem: Active Problems:   Sepsis ([place])  Assessment: Nolon Rod is a 77 yo F with known HTN, HLD, insulin dependent diabetes, PVD and previous CVA who presented in a state of somnolence that began the prior evening after a progressively worsening generalized illness of three days. Although she denied urinary symptoms, given her fever, leukocytosis,  initial tachycardia/hypotension, lactic acidosis, UA, somnolence, and renal calculi noted on CT w/ associated hydronephrosis it would appear that she is acutely ill secondary to urosepsis. Other considerations of note included bowel perforation 2/2 ulceration, bowel  ischemia given CVA/PVD history, influenza, pneumonia, severe gastritis, ACS, diverticulosis.  Plan: Urosepsis:  As above the patient appears to be septic secondary to urinary source.  Should a similar episode spontaneously resolved and she refused to be hospitalized at that time.  She is now amenable to full medical care and was a full scope of treatment.  We had consulted interventional radiology who agreed to discuss her case with urology to consider stent versus percutaneous nephrostomy placement to obtain source control given the dimensions of the renal calculi. -Continue ceftriaxone per pharmacy -Troponins negative -Continue fluids to maintain map greater than 65 and a lactate clears -Hold antihypertensives -Blood  cultures ordered in process -Urine culture ordered in process -Trend fever, WBCs, vitals -Trend lactic acid for clearance --Consulted IR who will observe patient for now as there is no clear obstruction requiring intervention noted on CT at this time  Insulin-dependent diabetes: CBG monitoring QID. Given her HgbA1c of >13 I feel that she is non-compliant with her insulin regimen of lantus 35U BID at home. At this time we will monitor her with Q4 CBG's and determine the need for therapy as her stay progresses.   Mild hypokalemia: Initial potassium 3.4 improved 3.5 without treatment.  Given the patient's junctional tachycardia on EKG no history possible arrhythmia we will treat this. -IV potassium 40 mEq over 4 hours.  Diet: NPO Code: Full Fluids: 16m/hr, maintain MAP >65 VTE ppx: Heparin 5K Units Q8 hours Dispo: Admit patient to Inpatient with expected length of stay greater than 2 midnights.  Signed: HKathi Ludwig MD 06/14/2017, 2:43 PM  Pager: Pager# [phone]

## 2017-06-14 NOTE — Progress Notes (Signed)
Pharmacy Antibiotic Note  Meghan Ford is a 77 y.o. female admitted on 06/14/2017 with sepsis.  Pharmacy has been consulted for zosyn and vancomycin dosing. Pt was recently diagnosed with a viral illness but over the last 2 days, she has had increasing weakness, confusion. She also has abdominal pain w/ N/V. CrCl ~ 35-40 mL/min. WBC 18.5. LA 4.45  Plan: -Vancomycin 1750 mg IV once, then vancomycin 1250 mg IV Q 24 hours -Zosyn 3.375 gm IV Q 8 hours (EI infusion) -Monitor CBC, renal fx, cultures and clinical progress -VT at SS     Temp (24hrs), Avg:99.7 F (37.6 C), Min:99.7 F (37.6 C), Max:99.7 F (37.6 C)  No results for input(s): WBC, CREATININE, LATICACIDVEN, VANCOTROUGH, VANCOPEAK, VANCORANDOM, GENTTROUGH, GENTPEAK, GENTRANDOM, TOBRATROUGH, TOBRAPEAK, TOBRARND, AMIKACINPEAK, AMIKACINTROU, AMIKACIN in the last 168 hours.  CrCl cannot be calculated (Patient's most recent lab result is older than the maximum 21 days allowed.).    No Known Allergies  Antimicrobials this admission: Vanc 4/4 >>  Zosyn 4/4 >>   Dose adjustments this admission: None   Microbiology results: 4/4 BCx:  4/4 UCx:     Thank you for allowing pharmacy to be a part of this patient's care.  Vinnie LevelBenjamin Eisa Conaway, PharmD., BCPS Clinical Pharmacist Clinical phone for 4/4 until 3:30pm: [phone]

## 2017-06-14 NOTE — Consult Note (Signed)
Chief Complaint: Patient was seen in consultation today for hydronephrosis due to calculi of left kidney.  Referring Physician(s): Axel Filler, MD  Supervising Physician: Corrie Mckusick  Patient Status: [facility] - In-pt  History of Present Illness: Meghan Ford is a 77 y.o. female  Presented to ED this AM with generalized weakness, confusion, abdominal pain, and N/V. She was determined to be septic, blood cultures pending.  CT abdomen/pelvis 06/14/2017: 1. 2.4 by 1.0 cm stone in the left renal pelvis. Although this does not appear currently obstructive, there is notable renal pelvis wall thickening, perirenal stranding, and distention of the renal pelvis. This stone could be intermittently obstructing to cause this kind of appearance. IV contrast was not administered. 2. Nonspecific 8 mm exophytic lesion from the right mid upper kidney anteriorly, technically too small to characterize. 3. Other imaging findings of potential clinical significance: Aortic Atherosclerosis (ICD10-I70.0). Cardiomegaly. Mild mitral valve calcification. Small type 1 hiatal hernia. Mild dependent atelectasis in the lung bases. Sigmoid colon diverticulosis. Lower lumbar foraminal impingement due to spondylosis and degenerative disc disease.  IR consulted by Dr. Evette Doffing for possible image-guided percutaneous nephrostomy tube placement.  Patient is resting in bed accompanied by son at bedside. Patient speaks spanish, son available to translate for me. Patient is confused, but did deny urinary symptoms including frequency, dysuria, hematuria, and enuresis.  Past Medical History:  Diagnosis Date  . Diabetes mellitus type II, uncontrolled ([place])   . Diabetes mellitus without complication ([place])   . Fx humeral neck   . Hyperlipidemia LDL goal < 100   . Hypertension   . Leg pain    worse with prolonged standing  . Peripheral vascular disease ([place])   . Stroke [facility]) 2018; 05/08/2017   denies  residual from 2018 stroke on 05/08/2017; weakness on left; speech issues from today's stroke (05/08/2017)  . Varicose veins     Past Surgical History:  Procedure Laterality Date  . FRACTURE SURGERY    . LOOP RECORDER INSERTION N/A 05/09/2017   Procedure: LOOP RECORDER INSERTION;  Surgeon: Constance Haw, MD;  Location: [facility];  Service: Cardiovascular;  Laterality: N/A;  . REVERSE SHOULDER ARTHROPLASTY Left 10/27/2014   Procedure: REVERSE SHOULDER ARTHROPLASTY;  Surgeon: Renette Butters, MD;  Location: [facility];  Service: Orthopedics;  Laterality: Left;  . TEE WITHOUT CARDIOVERSION N/A 05/09/2017   Procedure: TRANSESOPHAGEAL ECHOCARDIOGRAM (TEE);  Surgeon: Jerline Pain, MD;  Location: [facility] ENDOS[facility];  Service: Cardiovascular;  Laterality: N/A;  . TOTAL SHOULDER ARTHROPLASTY Left 10/27/2014   Procedure: TOTAL SHOULDER ARTHROPLASTY;  Surgeon: Renette Butters, MD;  Location: [facility];  Service: Orthopedics;  Laterality: Left;  Marland Kitchen VEIN SURGERY Left    left leg    Allergies: Patient has no known allergies.  Medications: Prior to Admission medications   Medication Sig Start Date End Date Taking? Authorizing Provider  amLODipine (NORVASC) 10 MG tablet Take 1 tablet (10 mg total) by mouth daily. 05/14/17  Yes Charlott Rakes, MD  aspirin 325 MG tablet Take 1 tablet (325 mg total) by mouth daily. 11/09/16  Yes Hosie Poisson, MD  atorvastatin (LIPITOR) 80 MG tablet Take 1 tablet (80 mg total) by mouth daily. 05/14/17  Yes Charlott Rakes, MD  baclofen (LIORESAL) 10 MG tablet Take 1 tablet (10 mg total) by mouth 2 (two) times daily. 05/14/17  Yes Charlott Rakes, MD  blood glucose meter kit and supplies KIT Dispense based on patient and insurance preference. Use up to four times daily as  directed. (FOR ICD-9 250.00, 250.01). 11/09/16  Yes Hosie Poisson, MD  Blood Glucose Monitoring Suppl (TRUE METRIX METER) DEVI 1 each by Does not apply route 3 (three) times daily before meals. 05/14/17  Yes Newlin,  Charlane Ferretti, MD  Butalbital-APAP-Caffeine (FIORICET) 50-300-40 MG CAPS Take 1 tablet by mouth every 12 (twelve) hours as needed. Patient taking differently: Take 1 tablet by mouth every 12 (twelve) hours as needed (pain).  05/14/17  Yes Charlott Rakes, MD  clopidogrel (PLAVIX) 75 MG tablet Take 1 tablet (75 mg total) by mouth daily. 05/14/17  Yes Charlott Rakes, MD  gabapentin (NEURONTIN) 300 MG capsule Take 1 capsule (300 mg total) by mouth at bedtime. 05/14/17  Yes Charlott Rakes, MD  glucose blood (TRUE METRIX BLOOD GLUCOSE TEST) test strip Use 3 times daily before meals 05/14/17  Yes Newlin, Enobong, MD  glucose blood test strip Use as instructed 11/09/16  Yes Hosie Poisson, MD  ibuprofen (ADVIL,MOTRIN) 200 MG tablet Take 200 mg by mouth every 6 (six) hours as needed for moderate pain.   Yes [provider]  insulin glargine (LANTUS) 100 UNIT/ML injection Inject 0.35 mLs (35 Units total) into the skin 2 (two) times daily. 05/14/17  Yes Charlott Rakes, MD  isosorbide mononitrate (IMDUR) 60 MG 24 hr tablet Take 1 tablet (60 mg total) by mouth daily. 05/14/17  Yes Newlin, Charlane Ferretti, MD  senna-docusate (SENOKOT-S) 8.6-50 MG tablet Take 1 tablet by mouth at bedtime as needed for mild constipation. 05/09/17  Yes Aline August, MD  TRUEPLUS LANCETS 28G MISC 1 each by Does not apply route 3 (three) times daily before meals. 05/14/17  Yes Charlott Rakes, MD     Family History  Problem Relation Age of Onset  . Cancer Brother   . Heart disease Sister     Social History   Socioeconomic History  . Marital status: Single    Spouse name: Not on file  . Number of children: Not on file  . Years of education: Not on file  . Highest education level: Not on file  Occupational History  . Not on file  Social Needs  . Financial resource strain: Not on file  . Food insecurity:    Worry: Not on file    Inability: Not on file  . Transportation needs:    Medical: Not on file    Non-medical: Not on file    Tobacco Use  . Smoking status: Never Smoker  . Smokeless tobacco: Never Used  Substance and Sexual Activity  . Alcohol use: No  . Drug use: No  . Sexual activity: Not on file  Lifestyle  . Physical activity:    Days per week: Not on file    Minutes per session: Not on file  . Stress: Not on file  Relationships  . Social connections:    Talks on phone: Not on file    Gets together: Not on file    Attends religious service: Not on file    Active member of club or organization: Not on file    Attends meetings of clubs or organizations: Not on file    Relationship status: Not on file  Other Topics Concern  . Not on file  Social History Narrative   ** Merged History Encounter **       Lives with son [name] who comes with her to majority of visits and helps coordinate care for her. He translates for her and has signed and patient signed a release for this on 02/02/12.  Review of Systems: A 12 point ROS discussed and pertinent positives are indicated in the HPI above.  All other systems are negative.  Review of Systems  Constitutional: Positive for fever. Negative for activity change.  Respiratory: Negative for shortness of breath and wheezing.   Cardiovascular: Negative for chest pain and palpitations.  Gastrointestinal: Positive for abdominal pain, nausea and vomiting.  Genitourinary: Negative for dysuria, enuresis, frequency and hematuria.  Neurological: Positive for weakness.  Psychiatric/Behavioral: Positive for confusion. Negative for behavioral problems.    Vital Signs: BP 126/61   Pulse 86   Temp 99.5 F (37.5 C) (Axillary)   Resp (!) 23   Ht _0  (1.549 m)   Wt 210 lb (95.3 kg)   SpO2 95%   BMI 39.68 kg/m   Physical Exam  Constitutional: She appears well-developed and well-nourished. No distress.  Cardiovascular: Normal rate, regular rhythm and normal heart sounds.  No murmur heard. Pulmonary/Chest: Effort normal and breath sounds  normal. She has no wheezes.  Skin: Skin is warm and dry.  Psychiatric:  Unable to evaluate due to confusion.    Imaging: Ct Abdomen Pelvis Wo Contrast  Result Date: 06/14/2017 CLINICAL DATA:  Fever, nausea, vomiting. Left lower quadrant abdominal pain. Abdominal distention. EXAM: CT ABDOMEN AND PELVIS WITHOUT CONTRAST TECHNIQUE: Multidetector CT imaging of the abdomen and pelvis was performed following the standard protocol without IV contrast. COMPARISON:  02/09/2010 FINDINGS: Lower chest: Cardiomegaly. Mild dependent atelectasis in both lower lobes and along the left hemidiaphragm. Mild mitral valve calcification. Small type 1 hiatal hernia. Hepatobiliary: Unremarkable Pancreas: Unremarkable Spleen: Unremarkable Adrenals/Urinary Tract: Adrenal glands normal. Dilated left renal collecting system with perirenal stranding, probable wall thickening in the renal pelvis, and mild left hydroureter extending down to the iliac vessel cross over. There is a 2.4 by 1.0 cm left renal pelvic calculus on image 55/5. Currently this is not obstructing the UPJ. No additional urinary tract calculi are noted. There is minimal chronic right perirenal stranding. 8 mm exophytic hypodense lesion of the right mid upper kidney due to small size and lack of IV contrast. Stomach/Bowel: Sigmoid colon diverticulosis. Minimal stranding along some of the margins of the descending colon is most attributable to the perirenal stranding extending into adjacent retroperitoneal spaces rather than active diverticulitis. Vascular/Lymphatic: Aortoiliac atherosclerotic vascular disease. A left external iliac node measures 0.9 cm in short axis on image 66/3, previously 0.8 cm. No overtly pathologic adenopathy is identified. Reproductive: Unremarkable Other: No supplemental non-categorized findings. Musculoskeletal: Grade 1 degenerative anterolisthesis at L4-5. Lower thoracic bridging spurring. Suspected right foraminal pinch bent at L3-4 and L5-S1  with left foraminal impingement at L4-5 due to spurring and degenerative disc disease. IMPRESSION: 1. 2.4 by 1.0 cm stone in the left renal pelvis. Although this does not appear currently obstructive, there is notable renal pelvis wall thickening, perirenal stranding, and distention of the renal pelvis. This stone could be intermittently obstructing to cause this kind of appearance. IV contrast was not administered. 2. Nonspecific 8 mm exophytic lesion from the right mid upper kidney anteriorly, technically too small to characterize. 3. Other imaging findings of potential clinical significance: Aortic Atherosclerosis (ICD10-I70.0). Cardiomegaly. Mild mitral valve calcification. Small type 1 hiatal hernia. Mild dependent atelectasis in the lung bases. Sigmoid colon diverticulosis. Lower lumbar foraminal impingement due to spondylosis and degenerative disc disease. Electronically Signed   By: Van Clines M.D.   On: 06/14/2017 09:12   Dg Chest Port 1 View  Result Date: 06/14/2017 CLINICAL DATA:  Sepsis,  history hypertension, diabetes mellitus, stroke EXAM: PORTABLE CHEST 1 VIEW COMPARISON:  Portable exam 0726 hours compared to 10/27/2014 FINDINGS: Enlargement of cardiac silhouette with pulmonary vascular congestion. Atherosclerotic calcification aorta. Severely kyphotic positioning. Bronchitic changes with bibasilar atelectasis. No acute infiltrate, pleural effusion or pneumothorax. Bones demineralized with prior reverse LEFT shoulder arthroplasty. IMPRESSION: Enlargement of cardiac silhouette with vascular congestion. Bronchitic changes with bibasilar atelectasis. Electronically Signed   By: Lavonia Dana M.D.   On: 06/14/2017 08:21    Labs:  CBC: Recent Labs    11/06/16 2050  05/07/17 1545 05/07/17 1618 05/09/17 0744 06/14/17 0726 06/14/17 0735  WBC 8.2  --  10.2  --  10.1 18.5*  --   HGB 14.3   < > 13.4 15.0 12.1 13.4 14.6  HCT 44.4   < > 41.9 44.0 38.9 41.6 43.0  PLT 218  --  272  --  253  281  --    < > = values in this interval not displayed.    COAGS: Recent Labs    11/06/16 2050 05/07/17 1545 05/09/17 0744  INR 1.00 1.02 1.04  APTT 29 31  --     BMP: Recent Labs    05/07/17 1545  05/08/17 0503 05/09/17 0744 06/14/17 0726 06/14/17 0735  NA 134*   < > 137 140 139 143  K 4.2   < > 4.2 4.0 3.4* 3.5  CL 96*   < > 100* 104 105 106  CO2 22  --  _0 --   GLUCOSE 376*   < > 229* 131* 123* 124*  BUN 51*   < > 45* 34* 23* 24*  CALCIUM 8.9  --  9.0 8.6* 8.8*  --   CREATININE 2.12*   < > 1.75* 1.51* 1.53* 1.40*  GFRNONAA 22*  --  28* 33* 32*  --   GFRAA 25*  --  32* 38* 38*  --    < > = values in this interval not displayed.    LIVER FUNCTION TESTS: Recent Labs    06/30/16 1557 11/06/16 2050 05/07/17 1545 06/14/17 0726  BILITOT 0.3 0.6 0.6 0.7  AST _1 35  ALT _2 ALKPHOS 97 104 92 80  PROT 6.6 6.8 7.2 6.6  ALBUMIN 3.8 3.6 3.0* 3.1*    TUMOR MARKERS: No results for input(s): AFPTM, CEA, CA199, CHROMGRNA in the last 8760 hours.  Assessment and Plan:  Hydronephrosis due to calculi of left kidney. Per Dr. Earleen Newport and Urology, patient not candidate for drain placement as the stone is non-obstructive. Plan for medical management with antibiotics. IR to follow- if no improvement in a few days, plan for renal ultrasound.  All questions answered and concerns addressed. Patient and son convey understanding and agree with plan.  Thank you for this interesting consult.  I greatly enjoyed meeting Meghan Ford and look forward to participating in their care.  A copy of this report was sent to the requesting provider on this date.  Electronically Signed: Earley Abide, PA-C 06/14/2017, 3:16 PM   I spent a total of 30 minutes in face to face in clinical consultation, greater than 50% of which was counseling/coordinating care for hydronephrosis due to calculi of left kidney.

## 2017-06-15 DIAGNOSIS — E119 Type 2 diabetes mellitus without complications: Secondary | ICD-10-CM

## 2017-06-15 DIAGNOSIS — IMO0001 Reserved for inherently not codable concepts without codable children: Secondary | ICD-10-CM

## 2017-06-15 DIAGNOSIS — Z794 Long term (current) use of insulin: Secondary | ICD-10-CM

## 2017-06-15 DIAGNOSIS — D649 Anemia, unspecified: Secondary | ICD-10-CM

## 2017-06-15 DIAGNOSIS — R7881 Bacteremia: Secondary | ICD-10-CM

## 2017-06-15 LAB — BLOOD CULTURE ID PANEL (REFLEXED)
ACINETOBACTER BAUMANNII: NOT DETECTED
CANDIDA ALBICANS: NOT DETECTED
CANDIDA GLABRATA: NOT DETECTED
CANDIDA TROPICALIS: NOT DETECTED
Candida krusei: NOT DETECTED
Candida parapsilosis: NOT DETECTED
Carbapenem resistance: NOT DETECTED
ESCHERICHIA COLI: DETECTED — AB
Enterobacter cloacae complex: NOT DETECTED
Enterobacteriaceae species: DETECTED — AB
Enterococcus species: NOT DETECTED
Haemophilus influenzae: NOT DETECTED
Klebsiella oxytoca: NOT DETECTED
Klebsiella pneumoniae: NOT DETECTED
Listeria monocytogenes: NOT DETECTED
NEISSERIA MENINGITIDIS: NOT DETECTED
PROTEUS SPECIES: DETECTED — AB
Pseudomonas aeruginosa: NOT DETECTED
SERRATIA MARCESCENS: NOT DETECTED
STAPHYLOCOCCUS SPECIES: NOT DETECTED
STREPTOCOCCUS AGALACTIAE: NOT DETECTED
Staphylococcus aureus (BCID): NOT DETECTED
Streptococcus pneumoniae: NOT DETECTED
Streptococcus pyogenes: NOT DETECTED
Streptococcus species: NOT DETECTED

## 2017-06-15 LAB — LACTIC ACID, PLASMA: LACTIC ACID, VENOUS: 1.2 mmol/L (ref 0.5–1.9)

## 2017-06-15 LAB — BASIC METABOLIC PANEL
Anion gap: 13 (ref 5–15)
BUN: 19 mg/dL (ref 6–20)
CALCIUM: 8 mg/dL — AB (ref 8.9–10.3)
CO2: 20 mmol/L — ABNORMAL LOW (ref 22–32)
Chloride: 110 mmol/L (ref 101–111)
Creatinine, Ser: 0.97 mg/dL (ref 0.44–1.00)
GFR calc Af Amer: >60 mL/min (ref >60)
GFR, EST NON AFRICAN AMERICAN: 56 mL/min — AB (ref >60)
GLUCOSE: 69 mg/dL (ref 65–99)
POTASSIUM: 3.5 mmol/L (ref 3.5–5.1)
Sodium: 143 mmol/L (ref 135–145)

## 2017-06-15 LAB — CBC
HEMATOCRIT: 36.5 % (ref 36.0–46.0)
HEMOGLOBIN: 11.4 g/dL — AB (ref 12.0–15.0)
MCH: 25.7 pg — ABNORMAL LOW (ref 26.0–34.0)
MCHC: 31.2 g/dL (ref 30.0–36.0)
MCV: 82.4 fL (ref 78.0–100.0)
Platelets: 261 10*3/uL (ref 150–400)
RBC: 4.43 MIL/uL (ref 3.87–5.11)
RDW: 15.6 % — ABNORMAL HIGH (ref 11.5–15.5)
WBC: 35.6 10*3/uL — ABNORMAL HIGH (ref 4.0–10.5)

## 2017-06-15 LAB — GLUCOSE, CAPILLARY
GLUCOSE-CAPILLARY: 89 mg/dL (ref 65–99)
GLUCOSE-CAPILLARY: 94 mg/dL (ref 65–99)
GLUCOSE-CAPILLARY: 98 mg/dL (ref 65–99)
Glucose-Capillary: 98 mg/dL (ref 65–99)

## 2017-06-15 MED ORDER — ACETAMINOPHEN 325 MG PO TABS
650.0000 mg | ORAL_TABLET | Freq: Four times a day (QID) | ORAL | Status: DC | PRN
  Administered 2017-06-15 – 2017-06-19 (×7): 650 mg via ORAL
  Filled 2017-06-15 (×8): qty 2

## 2017-06-15 MED ORDER — ONDANSETRON HCL 4 MG/2ML IJ SOLN
4.0000 mg | Freq: Three times a day (TID) | INTRAMUSCULAR | Status: DC | PRN
  Administered 2017-06-15: 4 mg via INTRAVENOUS
  Filled 2017-06-15: qty 2

## 2017-06-15 MED ORDER — ACETAMINOPHEN 650 MG RE SUPP
650.0000 mg | Freq: Once | RECTAL | Status: AC
  Administered 2017-06-15: 650 mg via RECTAL
  Filled 2017-06-15: qty 1

## 2017-06-15 MED ORDER — SODIUM CHLORIDE 0.9 % IV SOLN
2.0000 g | INTRAVENOUS | Status: DC
  Administered 2017-06-15 – 2017-06-17 (×3): 2 g via INTRAVENOUS
  Filled 2017-06-15 (×3): qty 20

## 2017-06-15 NOTE — Progress Notes (Signed)
   Subjective:  Per her son, [name] is the same as she was last night. She'Ford still feeling sick, having vomited once this morning.  Objective:  Vital signs in last 24 hours: Vitals:   06/14/17 2015 06/14/17 2348 06/15/17 0357 06/15/17 0804  BP: 121/67 (!) 156/66 (!) 148/55   Pulse:      Resp:      Temp: 99.2 F (37.3 C) 98.7 F (37.1 C) 98.5 F (36.9 C) 98.2 F (36.8 C)  TempSrc: Oral Oral Oral Axillary  SpO2:      Weight:      Height:       General: Sleepy, laying in bed, NAD Cardio: RRR, no murmurs appreciated. Abd: Bowel sounds present, non-tended, non-distended. Ext: Warm, no peripheral edema.   Assessment/Plan:  Active Problems:   Sepsis (HCC)   Insulin dependent diabetes mellitus [facility](HCC)  [name] is a 77 y.o. female with PMHx of IDDM, HTN, and history of multiple CVAs who presented to the ED with 3 days of somnolence.  Sepsis 2/2 pyelonephritis complicated by bacteremia:  CT abdomen revealed 2.4 by 1.0 cm left renal pelvic calculus, not obstructing ureteropelvic junction. Blood culture ID panel positive for E coli and Proteus, sensitivities pending. Ceftriaxone appropriate per facility antibiogram. Patient is clinically improving. Afebrile today. Blood pressure 130s-150s with continuous IV NS. Lactate 1.2, down from 3.16. WBC increased to 35.6 from 18.5 on presentation. - Continue trending WBCs - F/u sensitivities - Continue ceftriaxone 2g IV daily - Continue NS 3150mL/hr continuous IV - Hold antihypertensives for now  Insulin-dependent diabetes: Home Lantus 35 units twice daily held. Patient remains NPO. Blood glucose 70s-90s. - Continue CBG monitoring QID  Mild hypokalemia: Potassium 3.4 on presentation, stable at 3.5 now. - Continue to monitor    LOS: 1 day   Meghan Ford, Meghan Ford, Medical Student 06/15/2017, 11:33 AM

## 2017-06-15 NOTE — Progress Notes (Signed)
Patient has complaints of nausea and a  headache.  Patient had one episode of vomiting this AM.   Notified the MD on call.  Will continue to monitor the patient

## 2017-06-15 NOTE — Progress Notes (Signed)
Patient on clear liquid diet, was able to eat lunch and dinner with no problem, no complaints. Will continue to monitor.

## 2017-06-15 NOTE — Evaluation (Signed)
Physical Therapy Evaluation Patient Details Name: Meghan Ford MRN: [id number] DOB: August 17, 1940 Today's Date: 06/15/2017   History of Present Illness  Patient is a 77 y.o. F  with significant PMH of insulin-dependent diabetes, HLD, HTN, and previous CVA with residual L sided weakness, and peripheral vascular disease who presents for 3 days of somnolence per patient daughter. Patient experiencing L sided flank pain, increasing heaviness of head, fever, chills, diaphoresis, and constipation. CT of abdomen and pelvis showing left sided renal stone with mild hydronephrosis.   Clinical Impression  Pt admitted with above diagnosis. Pt currently with functional limitations due to the deficits listed below (see PT Problem List). Evaluation very limited by patient pain located in L flank. Unfortunately, patient not due for medication per discussion with RN. Patient agreeable to transfer out of bed in order to change bed linens but deferring further gait at this time. Requiring min assist for bed mobility and transfers with no AD. Patient with PT hand held assist and bed rail support for balance in standing. Patient will likely progress once pain is well controlled. Recommending HHPT and 24-hour assistance currently based on patient presenting as a fall risk and having confusion. Pt will benefit from skilled PT to increase their independence and safety with mobility to allow discharge to the venue listed below. Will trial gait training with SPC since patient uses it at baseline.     Follow Up Recommendations Home health PT;Supervision/Assistance - 24 hour    Equipment Recommendations  Other (comment)(TBD)    Recommendations for Other Services       Precautions / Restrictions Precautions Precautions: Fall Restrictions Weight Bearing Restrictions: No      Mobility  Bed Mobility Overal bed mobility: Needs Assistance Bed Mobility: Supine to Sit;Sit to Supine;Rolling Rolling: Modified  independent (Device/Increase time)   Supine to sit: Min assist Sit to supine: Min assist   General bed mobility comments: Min assist provided at trunk for bed mobility. Patient able to scoot self up in bed with extensive verbal cueing to bridge with BLE's.   Transfers Overall transfer level: Needs assistance Equipment used: 1 person hand held assist Transfers: Sit to/from Stand Sit to Stand: Min assist         General transfer comment: Min assist to power up to stand.   Ambulation/Gait Ambulation/Gait assistance: Min guard   Assistive device: 1 person hand held assist       General Gait Details: 3 side steps at edge of bed with L HHA and patient holding onto railing with R hand. Further gait deferred due to pain.  Stairs            Wheelchair Mobility    Modified Rankin (Stroke Patients Only)       Balance Overall balance assessment: Needs assistance Sitting-balance support: No upper extremity supported;Feet supported Sitting balance-Leahy Scale: Good     Standing balance support: Single extremity supported Standing balance-Leahy Scale: Fair                               Pertinent Vitals/Pain Pain Assessment: Faces Faces Pain Scale: Hurts whole lot Pain Location: L flank Pain Descriptors / Indicators: Grimacing;Moaning Pain Intervention(s): Limited activity within patient's tolerance;Monitored during session;Other (comment)(Per RN, patient not due for medication)    Home Living Family/patient expects to be discharged to:: Private residence Living Arrangements: Children Available Help at Discharge: Family;Available 24 hours/day Type of Home: House Home Access: Level entry  Home Layout: Able to live on main level with bedroom/bathroom Home Equipment: Bedside commode;Cane - single point;Shower seat Additional Comments: Patient lives with son    Prior Function Level of Independence: Independent with assistive device(s)          Comments: Uses SPC for ambulation occasionally per patient son     Hand Dominance        Extremity/Trunk Assessment   Upper Extremity Assessment Upper Extremity Assessment: RUE deficits/detail;LUE deficits/detail RUE Deficits / Details: WFL LUE Deficits / Details: Residual left sided weakness from previous CVA    Lower Extremity Assessment Lower Extremity Assessment: Generalized weakness       Communication   Communication: Interpreter utilized(Spanish speaking; video interpreter utilized)  Cognition Arousal/Alertness: Awake/alert Behavior During Therapy: Restless Overall Cognitive Status: Difficult to assess                                 General Comments: Patient repeatedly asking for food/drink although PT explained patient was NPO currently.       General Comments General comments (skin integrity, edema, etc.): Distended abdomen    Exercises     Assessment/Plan    PT Assessment Patient needs continued PT services  PT Problem List Decreased strength;Decreased activity tolerance;Decreased balance;Decreased mobility;Pain       PT Treatment Interventions DME instruction;Gait training;Functional mobility training;Therapeutic activities;Therapeutic exercise;Balance training;Patient/family education    PT Goals (Current goals can be found in the Care Plan section)  Acute Rehab PT Goals Patient Stated Goal: To eat PT Goal Formulation: With patient Time For Goal Achievement: 06/29/17 Potential to Achieve Goals: Good    Frequency Min 3X/week   Barriers to discharge        Co-evaluation               AM-PAC PT "6 Clicks" Daily Activity  Outcome Measure Difficulty turning over in bed (including adjusting bedclothes, sheets and blankets)?: A Little Difficulty moving from lying on back to sitting on the side of the bed? : Unable Difficulty sitting down on and standing up from a chair with arms (e.g., wheelchair, bedside commode, etc,.)?:  Unable Help needed moving to and from a bed to chair (including a wheelchair)?: A Little Help needed walking in hospital room?: A Lot Help needed climbing 3-5 steps with a railing? : Total 6 Click Score: 11    End of Session Equipment Utilized During Treatment: Gait belt Activity Tolerance: Patient limited by pain Patient left: in bed;with call bell/phone within reach;with bed alarm set Nurse Communication: Mobility status PT Visit Diagnosis: Unsteadiness on feet (R26.81);Muscle weakness (generalized) (M62.81);Pain Pain - Right/Left: Left Pain - part of body: (Flank)    Time: [id number] PT Time Calculation (min) (ACUTE ONLY): 34 min   Charges:   PT Evaluation $PT Eval Moderate Complexity: 1 Mod PT Treatments $Therapeutic Activity: 8-22 mins   PT G Codes:       Laurina Bustlearoline Oseph Imburgia, PT, DPT Acute Rehabilitation Services  Pager: [phone]  Vanetta MuldersCarloine H Lashawn Bromwell 06/15/2017, 1:07 PM

## 2017-06-15 NOTE — Progress Notes (Signed)
Subjective: A translator was at bedside during the evaluation the patient today. The patient was resting in her bed today upon entering the room.  She attested to continued generalized abdominal pain but was able to contribute somewhat more to her own history than on the prior day.   Objective:  Vital signs in last 24 hours: Vitals:   06/14/17 2015 06/14/17 2348 06/15/17 0357 06/15/17 0804  BP: 121/67 (!) 156/66 (!) 148/55   Pulse:      Resp:      Temp: 99.2 F (37.3 C) 98.7 F (37.1 C) 98.5 F (36.9 C) 98.2 F (36.8 C)  TempSrc: Oral Oral Oral Axillary  SpO2:      Weight:      Height:       ROS negative except as per HPI.  Physical Exam: General: No acute distress, conversational improving, Neuro: The patient is oriented but minimally alert continues to demonstrate mild somnolence although improved from the prior day HEENT: Extraocular movements intact, JVD improved Cardiovascular: RRR, no murmur rubs or gallops auscultated Pulmonary: Bilateral lung fields are clear to auscultation Abdomen: Patient's abdomen is soft, mildly tender, with normal bowel sounds Musculoskeletal: Bilateral lower extremity nonedematous, nontender  Assessment/Plan:  Active Problems:   Sepsis (HCC)  Assessment: Meghan Ford is a 77 yo F with known HTN, HLD, insulin dependent diabetes, PVD and previous CVA who presented in a state of somnolence that began the prior evening after a progressively worsening generalized illness of three days. Although she denied urinary symptoms, given her fever, leukocytosis,  initial tachycardia/hypotension, lactic acidosis, UA, somnolence, and renal calculi noted on CT w/ associated hydronephrosis it would appear that she is acutely ill secondary to urosepsis.  Interventional radiology and urology were consulted to discuss the patient's condition the potential need for interventional procedure.  At this time it is thought that the patient should complete  medical therapy and following resolution of her acute illness undergo possible surgical intervention vs lithotripsy. IR is following the condition but the patient fails to improve with notable signs of hydronephrosis recur and it is noted that she is in need for percutaneous nephrostomy.  Plan: Urosepsis: Given 2.4 x 1 cm left-sided renal calculi as well as evidence of mild hydronephrosis.  There is associated with her Proteus M. and E. Coli bacteremia concerning for urinary source for her positive sepsis criteria.  At this time there does not appear to be an obvious occlusion at the renal pelvis as such we will continue medical therapy.  Although there was a marked increase in the patient's leukocytosis to ~36K she has improved symptomatically. -Ceftriaxone is appropriate as per this facilities antibiogram based on the current information - Fluids adjusted to 50 mL's per hour given the improvement in blood pressure - Antibiotic sensitivities pending -Continue holding antihypertensives -Continue treating fever, WBCs and vitals -The patient's lactic acidosis has cleared and even returned to below normal limits  Anemia: Mild anemia to 11.4, unsure of the etiology. Will monitor for now and repeat CBC in am. NO evidence of blood loss, pressure stable.   Insulin-dependent diabetes: To be monitored 4 times daily.  Given hemoglobin A1c greater than 13, I believe that she is noncompliant with her home insulin regimen of Lantus 35 units twice daily at home.  As she is n.p.o. given her somnolence we will monitor blood glucose levels and initiate treatment as indicated.  Mild hypokalemia: Resolved.  Most recent potassium 3.5.  We will will hold on IV replacement  for now and treat with PO meds unless she continues to drop. Repeat BMP in am  Diet: We will advance her diet which she is alert Code: Full Fluids: 50 mL's an hour normal saline VTE PPX: Heparin Dispo: Anticipated discharge in approximately  2-3 day(s).   Meghan Ford, Meghan Eggenberger, MD 06/15/2017, 8:27 AM Pager: Pager# [phone]

## 2017-06-15 NOTE — Progress Notes (Addendum)
PHARMACY - PHYSICIAN COMMUNICATION CRITICAL VALUE ALERT - BLOOD CULTURE IDENTIFICATION (BCID)  Meghan Ford is an 77 y.o. female who [facility]   Assessment: 2/4 bottles with proteus and e.coli  Name of physician (or Provider) Contacted: IMTS  Current antibiotics: vancomycin/zosyn  Changes to prescribed antibiotics recommended:  Patient is on recommended antibiotics - No changes needed  Deescalation per day team; can likely discontinue vancomycin  Results for orders placed or performed during the hospital encounter of 06/14/17  Blood Culture ID Panel (Reflexed) (Collected: 06/14/2017  7:00 AM)  Result Value Ref Range   Enterococcus species NOT DETECTED NOT DETECTED   Listeria monocytogenes NOT DETECTED NOT DETECTED   Staphylococcus species NOT DETECTED NOT DETECTED   Staphylococcus aureus NOT DETECTED NOT DETECTED   Streptococcus species NOT DETECTED NOT DETECTED   Streptococcus agalactiae NOT DETECTED NOT DETECTED   Streptococcus pneumoniae NOT DETECTED NOT DETECTED   Streptococcus pyogenes NOT DETECTED NOT DETECTED   Acinetobacter baumannii NOT DETECTED NOT DETECTED   Enterobacteriaceae species DETECTED (A) NOT DETECTED   Enterobacter cloacae complex NOT DETECTED NOT DETECTED   Escherichia coli DETECTED (A) NOT DETECTED   Klebsiella oxytoca NOT DETECTED NOT DETECTED   Klebsiella pneumoniae NOT DETECTED NOT DETECTED   Proteus species DETECTED (A) NOT DETECTED   Serratia marcescens NOT DETECTED NOT DETECTED   Carbapenem resistance NOT DETECTED NOT DETECTED   Haemophilus influenzae NOT DETECTED NOT DETECTED   Neisseria meningitidis NOT DETECTED NOT DETECTED   Pseudomonas aeruginosa NOT DETECTED NOT DETECTED   Candida albicans NOT DETECTED NOT DETECTED   Candida glabrata NOT DETECTED NOT DETECTED   Candida krusei NOT DETECTED NOT DETECTED   Candida parapsilosis NOT DETECTED NOT DETECTED   Candida tropicalis NOT DETECTED NOT DETECTED     Baldemar FridayMasters, [name] 06/15/2017  4:33 AM

## 2017-06-16 ENCOUNTER — Inpatient Hospital Stay (HOSPITAL_COMMUNITY): Payer: Medicaid Other

## 2017-06-16 DIAGNOSIS — D649 Anemia, unspecified: Secondary | ICD-10-CM

## 2017-06-16 DIAGNOSIS — Z79899 Other long term (current) drug therapy: Secondary | ICD-10-CM

## 2017-06-16 DIAGNOSIS — R109 Unspecified abdominal pain: Secondary | ICD-10-CM | POA: Diagnosis not present

## 2017-06-16 DIAGNOSIS — R011 Cardiac murmur, unspecified: Secondary | ICD-10-CM

## 2017-06-16 DIAGNOSIS — N136 Pyonephrosis: Secondary | ICD-10-CM

## 2017-06-16 DIAGNOSIS — A4151 Sepsis due to Escherichia coli [E. coli]: Principal | ICD-10-CM

## 2017-06-16 LAB — COMPREHENSIVE METABOLIC PANEL
ALT: 14 U/L (ref 14–54)
ANION GAP: 9 (ref 5–15)
AST: 31 U/L (ref 15–41)
Albumin: 2.7 g/dL — ABNORMAL LOW (ref 3.5–5.0)
Alkaline Phosphatase: 75 U/L (ref 38–126)
BILIRUBIN TOTAL: 0.9 mg/dL (ref 0.3–1.2)
BUN: 15 mg/dL (ref 6–20)
CO2: 23 mmol/L (ref 22–32)
Calcium: 8.6 mg/dL — ABNORMAL LOW (ref 8.9–10.3)
Chloride: 109 mmol/L (ref 101–111)
Creatinine, Ser: 0.87 mg/dL (ref 0.44–1.00)
GFR calc Af Amer: >60 mL/min (ref >60)
Glucose, Bld: 98 mg/dL (ref 65–99)
POTASSIUM: 3.8 mmol/L (ref 3.5–5.1)
Sodium: 141 mmol/L (ref 135–145)
Total Protein: 6.6 g/dL (ref 6.5–8.1)

## 2017-06-16 LAB — CBC
HEMATOCRIT: 42.9 % (ref 36.0–46.0)
HEMOGLOBIN: 13.4 g/dL (ref 12.0–15.0)
MCH: 25.6 pg — ABNORMAL LOW (ref 26.0–34.0)
MCHC: 31.2 g/dL (ref 30.0–36.0)
MCV: 81.9 fL (ref 78.0–100.0)
Platelets: 236 10*3/uL (ref 150–400)
RBC: 5.24 MIL/uL — ABNORMAL HIGH (ref 3.87–5.11)
RDW: 15.1 % (ref 11.5–15.5)
WBC: 25.7 10*3/uL — AB (ref 4.0–10.5)

## 2017-06-16 LAB — URINE CULTURE
Culture: >=100000 — AB
Special Requests: NORMAL

## 2017-06-16 LAB — GLUCOSE, CAPILLARY
Glucose-Capillary: 87 mg/dL (ref 65–99)
Glucose-Capillary: 102 mg/dL — ABNORMAL HIGH (ref 65–99)
Glucose-Capillary: 104 mg/dL — ABNORMAL HIGH (ref 65–99)

## 2017-06-16 MED ORDER — AMLODIPINE BESYLATE 10 MG PO TABS
10.0000 mg | ORAL_TABLET | Freq: Every day | ORAL | Status: DC
  Administered 2017-06-16 – 2017-06-19 (×4): 10 mg via ORAL
  Filled 2017-06-16 (×4): qty 1

## 2017-06-16 MED ORDER — ISOSORBIDE MONONITRATE ER 60 MG PO TB24
60.0000 mg | ORAL_TABLET | Freq: Every day | ORAL | Status: DC
  Administered 2017-06-16 – 2017-06-19 (×4): 60 mg via ORAL
  Filled 2017-06-16 (×4): qty 1

## 2017-06-16 NOTE — Progress Notes (Signed)
Patient BP 174/79.  No PRN orders.  Notified the MD.  Will continue to monitor the patient.

## 2017-06-16 NOTE — Progress Notes (Signed)
   Subjective:  A phone translator was used during evaluation of the patient today. She says she feels "terrible," though agrees she feels better than yesterday. She endorses feeling chills, saying she feels "cold and it won't go away." She continues to have abdominal pain and decreased appetite, though she has been able to consume liquids and soups. Willing to try solid foods if it helps her feel better.  Objective:  Vital signs in last 24 hours: Vitals:   06/15/17 2051 06/15/17 2152 06/16/17 0152 06/16/17 0342  BP:  136/78 (!) 178/83 (!) 174/79  Pulse:  79 83 85  Resp:  (!) 28 (!) 25 (!) 30  Temp: 98.8 F (37.1 C)     TempSrc: Oral     SpO2:  96% 96% 95%  Weight:      Height:       General: Laying in bed in moderate discomfort. Cardio: RRR, no murmurs appreciated. Pulm: Clear to auscultation bilaterally. Abd: Bowel sounds present. Tenderness to palpation in RUQ, increasing on inspiration. No CVA tenderness.  Ext: Warm, no peripheral edema.   Assessment/Plan:  Principal Problem:   Sepsis (HCC) Active Problems:   Hypertension   Insulin dependent diabetes mellitus (HCC)   RUQ abdominal pain  Ms. Warren Lacyscobar is a 77 y.o. female with PMHx of IDDM, HTN, and history of multiple CVAs who presented to the ED with 3 days of somnolence.  Sepsis 2/2 pyelonephritis complicated by bacteremia:  CT abdomen revealed 2.4 by 1.0 cm left renal pelvic calculus, not obstructing ureteropelvic junction. Blood culture ID panel positive for E coli and Proteus, sensitivities pending. Ceftriaxone appropriate per facility antibiogram. Patient is clinically improving and conversing today. No longer endorsing L flank pain. Remains afebrile, though having subjective chills. Blood pressure 130s-170s with continuous IV NS. Lactate improved. WBC 25.7 down from 35.6 on 4/5.  - Restart home Imdur 60mg  daily - D/c IV NS given clinical improvement and increased PO intake - Continue trending WBCs - F/u  sensitivities - Continue ceftriaxone 2g IV daily  RUQ abdominal pain: Patient endorsing RUQ pain today that increases with inspiration. Concerning for acute calculous cholecystitis given leukocytosis per above, subjective chills, and intermittent nausea. AST 35, ALT 18, T Bili 0.7 on presentation. - Repeat CMP - RUQ ultrasound  Insulin-dependent diabetes: Home Lantus 35 units twice daily held. Patient managed liquid diet overnight, advance to solid foods as tolerated today. - Continue CBG monitoring QID, will reconsider starting insulin if needed    LOS: 2 days   Carie CaddyGurjar, Hadlee Burback S, Medical Student 06/16/2017, 10:39 AM

## 2017-06-16 NOTE — Progress Notes (Signed)
   Subjective: [name] is more awake today. She is having right sided abdominal pain and is tolerating clear liquid diet well. She was afebrile overnight and has had no nausea. She feels improved from the time of admission but is not feeling well today.   Objective:  Vital signs in last 24 hours: Vitals:   06/15/17 2152 06/16/17 0152 06/16/17 0342 06/16/17 0952  BP: 136/78 (!) 178/83 (!) 174/79 (!) 146/68  Pulse: 79 83 85 73  Resp: (!) 28 (!) 25 (!) 30 (!) 0  Temp:      TempSrc:      SpO2: 96% 96% 95% 97%  Weight:      Height:       General: no acute distress, resting well, tired appearing  Cardiac: RRR, no murmur, no peripheral edema  Pulm: no respiratory distress, lungs clear to auscultation     Assessment/Plan:  This is a 77 year old woman with history of HTN, HLD, insulin dependent diabetes, peripheral vascular disease, and previous CBA who presented with somnolence and was found to have a large staghorn calculus with mild hydronephrosis in the right renal pelvis with blood cultures positive for Ecoli.     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 Transaminase normal this morning. No known history of gallstones.  - will obtain RUQ ultrasound     Hypertension - blood pressure controlled 146/68 this morning  - restart home amlodipine 10 mg daily     Insulin dependent diabetes mellitus (HCC) - not yet tolerating consistent PO intake, blood glucose has been consistently less than 100  - continue CBG monitoring  - will start glucose lowering  medications when she is eating consistently   Anemia  Resolved on CBC today  Hypokalemia  Resolved   Dispo: Anticipated discharge in approximately 3-4 day(s).   Eulah PontBlum, Orrin Yurkovich, MD 06/16/2017, 12:34 PM Pager: [phone]

## 2017-06-17 LAB — CBC
HCT: 35.7 % — ABNORMAL LOW (ref 36.0–46.0)
HEMOGLOBIN: 10.9 g/dL — AB (ref 12.0–15.0)
MCH: 24.7 pg — ABNORMAL LOW (ref 26.0–34.0)
MCHC: 30.5 g/dL (ref 30.0–36.0)
MCV: 80.8 fL (ref 78.0–100.0)
Platelets: 273 10*3/uL (ref 150–400)
RBC: 4.42 MIL/uL (ref 3.87–5.11)
RDW: 14.9 % (ref 11.5–15.5)
WBC: 13.3 10*3/uL — AB (ref 4.0–10.5)

## 2017-06-17 LAB — GLUCOSE, CAPILLARY: GLUCOSE-CAPILLARY: 85 mg/dL (ref 65–99)

## 2017-06-17 MED ORDER — CEFDINIR 300 MG PO CAPS
300.0000 mg | ORAL_CAPSULE | Freq: Two times a day (BID) | ORAL | Status: DC
  Administered 2017-06-18 – 2017-06-19 (×3): 300 mg via ORAL
  Filled 2017-06-17 (×3): qty 1

## 2017-06-17 NOTE — Progress Notes (Signed)
RN paged MD to make MD aware BP is 173/77, awaiting response.  P.J. Henderson NewcomerSexton, RN

## 2017-06-17 NOTE — Progress Notes (Addendum)
   Subjective:  [name] is more awake today, she is sitting up in bed speaking with her daughter. She says that she is feeling well today. She ate about half of her dinner and had no rigors overnight. She is feeling better today.   Objective:  Vital signs in last 24 hours: Vitals:   06/17/17 0014 06/17/17 0430 06/17/17 0742 06/17/17 0843  BP: (!) 156/70 (!) 173/77  (!) 154/79  Pulse: 77 81  65  Resp: (!) 21 (!) 24  14  Temp: 98.5 F (36.9 C) 98.8 F (37.1 C) 98.2 F (36.8 C) 98.5 F (36.9 C)  TempSrc: Oral Oral Oral Oral  SpO2: 92% 98%  94%  Weight:      Height:       General: no acute distress, resting well, tired appearing  Cardiac: RRR, no murmur, no peripheral edema  Pulm: no respiratory distress, lungs clear to auscultation  Gastrointestinal: BS+, non tender, non distended     Assessment/Plan:  This is a 77 year old woman with history of HTN, HLD, insulin dependent diabetes, peripheral vascular disease, and previous CBA who presented with somnolence and was found to have a large staghorn calculus with mild hydronephrosis in the right renal pelvis with blood cultures positive for Ecoli.     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  - blood cultures with Ecoli sensitive to ceftriaxone, Proteus detected on ID, in 1/2 bottles  - afebrile overnight, back to baseline mental status, blood pressures are stable and fever has resolved  - ceftriaxone 4/4 >> 4/7,  will transition to po cefdinir tomorrow to complete a 14 day course  - continue regular diet, continue vital checks     RUQ abdominal pain - RUQ ultrasound with biliary hypokinesia, will need a HIDA scan in the future for further workup     Hypertension - blood pressure controlled this morning  - continue home amlodipine 10 mg daily     Insulin dependent diabetes mellitus (HCC) -  blood glucose has been consistently less than 100, PO intake is improving    - will start home lantus when she is eating consistently   Normocytic Anemia  - Hgb stable at 10.9 this morning, asymptomatic   Hypokalemia  Resolved   Dispo: Anticipated discharge in approximately 1-2 day(s).   Eulah PontBlum, Dodger Sinning, MD 06/17/2017, 1:07 PM Pager: [phone]

## 2017-06-17 NOTE — Progress Notes (Signed)
RN paged MD to make MD aware that Hgb 10.9 and Hct 35.7 today, but Hgb was 13.4 and Hct was 42.9 yesterday, awaiting response.  P.J. Henderson NewcomerSexton, RN

## 2017-06-17 NOTE — Progress Notes (Signed)
MD called RN and informed RN to continue to monitor patient, no new orders received at this time.  P.J. Henderson NewcomerSexton, RN

## 2017-06-17 NOTE — Progress Notes (Signed)
Grandson mentioned that patient's left arm has been weak and that this began weeks ago. Hand grips strong on the right side and moderate on left. MD notified.

## 2017-06-18 DIAGNOSIS — N12 Tubulo-interstitial nephritis, not specified as acute or chronic: Secondary | ICD-10-CM

## 2017-06-18 DIAGNOSIS — N200 Calculus of kidney: Secondary | ICD-10-CM

## 2017-06-18 DIAGNOSIS — B001 Herpesviral vesicular dermatitis: Secondary | ICD-10-CM

## 2017-06-18 LAB — CULTURE, BLOOD (ROUTINE X 2)
SPECIAL REQUESTS: ADEQUATE
SPECIAL REQUESTS: ADEQUATE

## 2017-06-18 LAB — GLUCOSE, CAPILLARY
GLUCOSE-CAPILLARY: 128 mg/dL — AB (ref 65–99)
Glucose-Capillary: 129 mg/dL — ABNORMAL HIGH (ref 65–99)
Glucose-Capillary: 132 mg/dL — ABNORMAL HIGH (ref 65–99)

## 2017-06-18 MED ORDER — VALACYCLOVIR HCL 500 MG PO TABS
2000.0000 mg | ORAL_TABLET | Freq: Two times a day (BID) | ORAL | Status: AC
  Administered 2017-06-18 (×2): 2000 mg via ORAL
  Filled 2017-06-18 (×2): qty 4

## 2017-06-18 NOTE — Progress Notes (Signed)
Physical Therapy Treatment Patient Details Name: Meghan Ford MRN: [id number] DOB: [birth year] Today's Date: 06/18/2017    History of Present Illness Patient is a 77 y.o. F  with significant PMH of insulin-dependent diabetes, HLD, HTN, and previous CVA with residual L sided weakness, and peripheral vascular disease who presents for 3 days of somnolence per patient daughter. Patient experiencing L sided flank pain, increasing heaviness of head, fever, chills, diaphoresis, and constipation. CT of abdomen and pelvis showing left sided renal stone with mild hydronephrosis.     PT Comments    Patient is progressing very well towards their physical therapy goals. Improved pain control this session with increased mobility. However, patient presenting with new visual deficits, causing poor safety awareness and increased fall risk. Initially trialed RW for balance during gait and patient bumping into objects despite verbal cueing and physical assistance. Benefitted from hand held guidance as well as verbal cueing to avoid obstacles. RN notified and aware.      Follow Up Recommendations  Home health PT;Supervision/Assistance - 24 hour     Equipment Recommendations  Other (comment)(TBD)    Recommendations for Other Services       Precautions / Restrictions Precautions Precautions: Fall Restrictions Weight Bearing Restrictions: No    Mobility  Bed Mobility Overal bed mobility: Modified Independent Bed Mobility: Supine to Sit;Sit to Supine;Rolling Rolling: Modified independent (Device/Increase time)   Supine to sit: Modified independent (Device/Increase time) Sit to supine: Modified independent (Device/Increase time)      Transfers Overall transfer level: Needs assistance Equipment used: 1 person hand held assist Transfers: Sit to/from [employer] Sit to Stand: Supervision Stand pivot transfers: Min guard       General transfer comment: Supervision for  safety for sit to stands and min guard for stand pivots from chair to [employer]  Ambulation/Gait Ambulation/Gait assistance: Min assist Ambulation Distance (Feet): 100 Feet Assistive device: 1 person hand held assist Gait Pattern/deviations: Step-through pattern Gait velocity: decreased Gait velocity interpretation: Below normal speed for age/gender General Gait Details: Patient with min reliance through hand held assist during gait. Initially trialed RW for balance but patient repeatedly running into objects even with physical assistance and verbal cueing.     Stairs            Wheelchair Mobility    Modified Rankin (Stroke Patients Only)       Balance Overall balance assessment: Needs assistance Sitting-balance support: No upper extremity supported;Feet supported Sitting balance-Leahy Scale: Good     Standing balance support: Single extremity supported Standing balance-Leahy Scale: Fair                              Cognition Arousal/Alertness: Awake/alert Behavior During Therapy: WFL for tasks assessed/performed Overall Cognitive Status: Difficult to assess                                 General Comments: Patient with decreased safety awareness with visual deficits      Exercises      General Comments        Pertinent Vitals/Pain Pain Assessment: No/denies pain    Home Living                      Prior Function            PT Goals (current goals can now be found  in the care plan section) Acute Rehab PT Goals Patient Stated Goal: To eat PT Goal Formulation: With patient Time For Goal Achievement: 06/29/17 Potential to Achieve Goals: Good Progress towards PT goals: Progressing toward goals    Frequency    Min 3X/week      PT Plan Current plan remains appropriate    Co-evaluation              AM-PAC PT "6 Clicks" Daily Activity  Outcome Measure  Difficulty turning over in bed (including adjusting  bedclothes, sheets and blankets)?: None Difficulty moving from lying on back to sitting on the side of the bed? : None Difficulty sitting down on and standing up from a chair with arms (e.g., wheelchair, bedside commode, etc,.)?: None Help needed moving to and from a bed to chair (including a wheelchair)?: A Little Help needed walking in hospital room?: A Little Help needed climbing 3-5 steps with a railing? : A Lot 6 Click Score: 20    End of Session Equipment Utilized During Treatment: Gait belt Activity Tolerance: Patient tolerated treatment well Patient left: in bed;with call bell/phone within reach;with bed alarm set Nurse Communication: Mobility status PT Visit Diagnosis: Unsteadiness on feet (R26.81);Muscle weakness (generalized) (M62.81);Pain Pain - Right/Left: Left Pain - part of body: (Flank)     Time: 1610-96041450-1515 PT Time Calculation (min) (ACUTE ONLY): 25 min  Charges:  $Gait Training: 8-22 mins $Therapeutic Activity: 8-22 mins                    G Codes:      Laurina Bustlearoline Koleson Reifsteck, PT, DPT Acute Rehabilitation Services  Pager: [phone]   Vanetta MuldersCarloine H Emileo Semel 06/18/2017, 3:31 PM

## 2017-06-18 NOTE — Care Management Note (Signed)
Case Management Note  Patient Details  Name: Meghan Ford MRN: [id number] Date of Birth: 01/08/1941  Subjective/Objective:   Sepsis                PCP: Alvis LemmingsNewlin  Action/Plan:   Transition to home with home health services to follow ( charity case/ pt without insurance). Family (Mayra/Stephanie) states @ d/c pt will have 24/7 caregiver in home with pt. States pt also have cameras in her  living and bedroom areas. Family to provide transportation to home once d/c.  Expected Discharge Date:   06/18/2017           Expected Discharge Plan:  Home w Home Health Services  In-House Referral:     Discharge planning Services  CM Consult, [facility], Follow-up appt scheduled  Post Acute Care Choice:  Home Health Choice offered to:  Patient  DME Arranged:    DME Agency:     HH Arranged:   PT,SW HH Agency:   [facility], pending MD'S orders.  Status of Service:  completed  If discussed at Long Length of Stay Meetings, dates discussed:    Additional Comments:  Epifanio LeschesCole, Emon Lance Hudson, RN 06/18/2017, 4:52 PM

## 2017-06-18 NOTE — Progress Notes (Signed)
   Subjective: Patient denying pain today upon evaluation. She stated that she feels much better, would like to eat more but is experiencing upper lip pain with PO intake. She would prefer an inpatient renal caliculi removal but understands that once medically stable she will be able to f/up as an outpatient with a Urologist.   Objective:  Vital signs in last 24 hours: Vitals:   06/17/17 2006 06/17/17 2055 06/18/17 0030 06/18/17 0411  BP:  (!) 169/79 (!) 163/74 (!) 173/70  Pulse:  74 70 73  Resp:  11 19 20   Temp: 98.8 F (37.1 C) 98.7 F (37.1 C) 98.5 F (36.9 C) 98 F (36.7 C)  TempSrc: Oral Oral Oral Oral  SpO2:  92% 97% 90%  Weight:      Height:       ROS negative except as per HPI.  Physical Exam: General: No acute distress, afebrile, alert, conversant Neuro: There is mild grade 4/5 weakness on the left, similar to post CVA weakness Cardiovascular: RRR, no murmurs rubs or gallops appreciated, JVD decreased ~3cm Pulmonary: Bilateral lung sounds are clear to auscultation GI: Abdomen is soft, nontender, nondistended.  Sepsis clear to polynephritis setting of frequent calculus of the right renal pelvis: Blood cultures with E. 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  Blood cultures were positive for E. coli and Proteus she improved symptomatically with ceftriaxone.  Sepsis secondary to pyonephritis and significant calculus of the right renal pelvis: 06/17/2017 ceftriaxone.  Patient continues to be  afebrile overnight with vitals returning to normal. Mental status has returned to baseline.  She appears to be stable for transfer from stepdown to MedSurg floor with discontinuation of telemetry.  She was transitioned to cefdinir p.o. On 09/17/2017 in anticipation for discharge for the following 1-2 days pending further improvement.  RUQ abdominal pain: RUQ US w/ biliary hypokinesia, will need a HIDA scan in the near future. She will need a referral for an outpatient HIDA scan upon discharge.  HTN: BP poorly controlled again this am. 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  Herpes Labialis: Will initiate a single dose of valacyclovir today for treatment. -2grams x 2 doses today  Normocytic Anemia: -Hgb stable at 10.9 the prior day, no repeat today. --CBC in am  Hypokalemia: Resolved --BMP in am  Diet: carb modified Code: full Gi ppx: n/a Dvt ppx: heparin Dispo: Anticipated discharge in approximately 2-3 day(s).   Lanelle BalHarbrecht, Tarissa Kerin, MD 06/18/2017, 7:35 AM Pager: Pager# [phone]

## 2017-06-18 NOTE — Progress Notes (Signed)
   Subjective:  A phone translator was used during evaluation of the patient today. She is feeling mostly back to normal - "90%" of the way there. She was able to eat a banana this morning, hasn't been comfortable taking a lot in PO because of "mouth ulcers." Not in any pain today, abdominal or flank.   Objective:  Vital signs in last 24 hours: Vitals:   06/18/17 0030 06/18/17 0411 06/18/17 0800 06/18/17 1100  BP: (!) 163/74 (!) 173/70 (!) 160/62 (!) 147/75  Pulse: 70 73 (!) 59 68  Resp: 19 20 (!) 23 (!) 9  Temp: 98.5 F (36.9 C) 98 F (36.7 C) 99.4 F (37.4 C) 99.4 F (37.4 C)  TempSrc: Oral Oral Oral Oral  SpO2: 97% 90% 96% 99%  Weight:      Height:       General: Sitting up in chair, NAD Cardio: RRR, no murmurs appreciated. Abd: Bowel sounds present. Soft, non-tender, non-distended. Ext: Warm, no peripheral edema.   Assessment/Plan:  Principal Problem:   Sepsis (HCC) Active Problems:   Hypertension   Hypokalemia   Insulin dependent diabetes mellitus (HCC)   RUQ abdominal pain   Anemia   Herpes labialis  Ms. Warren Lacyscobar is a 77 y.o. female with PMHx of IDDM, HTN, and history of multiple CVAs who presented to the ED with 3 days of somnolence.  Sepsis 2/2 pyelonephritis in the setting of L renal pelvic calculus: CT abdomen revealed 2.4 by 1.0 cm left renal pelvic calculus, not obstructing ureteropelvic junction. Patient has clinically improved on IV ceftriaxone. Remains afebrile. Mental status back to baseline per her son. - De-escalate ceftriaxone IV to cefdinir 300mg  BID PO  Herpes labialis: Lesions found on exam today on upper lip. Per her son, she's had them for the past 2 days and has had difficulty with PO intake due to the pain. - Valacyclovir 2g BID for one day  RUQ abdominal pain: RUQ ultrasound showed mildly distended gallbladder without cholelithiasis, possible biliary dyskinesis. Patient currently denying RUQ pain, negative Murphy's sign on exam. - Continue to  monitor - Recommend HIDA scan after discharge per radiology  HTN: Hypertensive today with systolics 147-160.  - Continue home amlodipine 10mg  - Consider starting ACE inhibitor 4/9 if pressures remain elevated  Insulin-dependent diabetes: Blood glucose 120s. Tolerating some PO intake today but not back to baseline d/t pain from herpes lesions as above. - Continue to hold home Lantus 35U BID  Normocytic Anemia: Hgb 10.9 on 4/7. No repeat CBC today, remains asymptomatic. - CBC 4/9 AM  Diet: Regular  DVT PPx: SQ heparin  Code Status: Full  Dispo: Anticipate discharge in 1-2 days.   LOS: 4 days   Carie CaddyGurjar, Jeanna Giuffre S, Medical Student 06/18/2017, 11:48 AM

## 2017-06-19 ENCOUNTER — Telehealth: Payer: Self-pay | Admitting: Family Medicine

## 2017-06-19 DIAGNOSIS — K5900 Constipation, unspecified: Secondary | ICD-10-CM

## 2017-06-19 DIAGNOSIS — R1031 Right lower quadrant pain: Secondary | ICD-10-CM

## 2017-06-19 DIAGNOSIS — N132 Hydronephrosis with renal and ureteral calculous obstruction: Secondary | ICD-10-CM

## 2017-06-19 LAB — BASIC METABOLIC PANEL
ANION GAP: 9 (ref 5–15)
BUN: 12 mg/dL (ref 6–20)
CALCIUM: 8.9 mg/dL (ref 8.9–10.3)
CHLORIDE: 107 mmol/L (ref 101–111)
CO2: 25 mmol/L (ref 22–32)
CREATININE: 0.87 mg/dL (ref 0.44–1.00)
GFR calc Af Amer: >60 mL/min (ref >60)
GFR calc non Af Amer: >60 mL/min (ref >60)
Glucose, Bld: 146 mg/dL — ABNORMAL HIGH (ref 65–99)
Potassium: 3.2 mmol/L — ABNORMAL LOW (ref 3.5–5.1)
SODIUM: 141 mmol/L (ref 135–145)

## 2017-06-19 LAB — CBC
HEMATOCRIT: 38.2 % (ref 36.0–46.0)
HEMOGLOBIN: 11.7 g/dL — AB (ref 12.0–15.0)
MCH: 24.4 pg — ABNORMAL LOW (ref 26.0–34.0)
MCHC: 30.6 g/dL (ref 30.0–36.0)
MCV: 79.6 fL (ref 78.0–100.0)
Platelets: 283 10*3/uL (ref 150–400)
RBC: 4.8 MIL/uL (ref 3.87–5.11)
RDW: 14.6 % (ref 11.5–15.5)
WBC: 7 10*3/uL (ref 4.0–10.5)

## 2017-06-19 LAB — GLUCOSE, CAPILLARY
GLUCOSE-CAPILLARY: 127 mg/dL — AB (ref 65–99)
GLUCOSE-CAPILLARY: 169 mg/dL — AB (ref 65–99)

## 2017-06-19 MED ORDER — POTASSIUM CHLORIDE CRYS ER 20 MEQ PO TBCR
40.0000 meq | EXTENDED_RELEASE_TABLET | ORAL | Status: AC
  Administered 2017-06-19 (×2): 40 meq via ORAL
  Filled 2017-06-19 (×2): qty 2

## 2017-06-19 MED ORDER — ASPIRIN 81 MG PO TABS: 81.0000 mg | ORAL_TABLET | Freq: Every day | ORAL | 11 refills | Status: DC

## 2017-06-19 MED ORDER — CLOPIDOGREL BISULFATE 75 MG PO TABS: 75.0000 mg | ORAL_TABLET | Freq: Every day | ORAL | 1 refills | Status: DC

## 2017-06-19 MED ORDER — LISINOPRIL 10 MG PO TABS: 10.0000 mg | ORAL_TABLET | Freq: Every day | ORAL | 0 refills | Status: DC

## 2017-06-19 MED ORDER — CEFDINIR 300 MG PO CAPS: 300.0000 mg | ORAL_CAPSULE | Freq: Two times a day (BID) | ORAL | 0 refills | Status: DC

## 2017-06-19 MED FILL — CLOPIDOGREL 75 MG TABLET: 75 | 30 days supply | Qty: 30 | Fill #0

## 2017-06-19 MED FILL — CEFDINIR 300 MG CAPSULE: 300 | 14 days supply | Qty: 28 | Fill #0

## 2017-06-19 MED FILL — LISINOPRIL 10 MG TABS: 10 | 30 days supply | Qty: 30 | Fill #0

## 2017-06-19 NOTE — Progress Notes (Signed)
Physical Therapy Treatment Patient Details Name: Amadi Yoshino MRN: [id number] DOB: [birth year] Today's Date: 06/19/2017    History of Present Illness Patient is a 77 y.o. F  with significant PMH of insulin-dependent diabetes, HLD, HTN, and previous CVA with residual L sided weakness, and peripheral vascular disease who presents for 3 days of somnolence per patient daughter. Patient experiencing L sided flank pain, increasing heaviness of head, fever, chills, diaphoresis, and constipation. CT of abdomen and pelvis showing left sided renal stone with mild hydronephrosis.     PT Comments    Pt  Improved overall, but still could benefit from HHPT to strengthen and recondition.  Follow Up Recommendations  Home health PT;Supervision/Assistance - 24 hour     Equipment Recommendations  No equipment needs, pt did not want to use the RW   Recommendations for Other Services       Precautions / Restrictions Precautions Precautions: Fall Restrictions Weight Bearing Restrictions: No    Mobility  Bed Mobility               General bed mobility comments: sitting up on arrival  Transfers Overall transfer level: Needs assistance   Transfers: Sit to/from Stand Sit to Stand: Min assist         General transfer comment: cues for hand placement and help to come forward.  Ambulation/Gait Ambulation/Gait assistance: Min guard Ambulation Distance (Feet): 380 Feet Assistive device: Straight cane Gait Pattern/deviations: Step-through pattern Gait velocity: decreased Gait velocity interpretation: Below normal speed for age/gender General Gait Details: Generally safe, but not optimal use of the cane.  Steady, but guarded and times.   Pt able to back up, scan, minimally change speeds and turn tentatively, but not needing assist.   Stairs            Wheelchair Mobility    Modified Rankin (Stroke Patients Only)       Balance   Sitting-balance support: No upper  extremity supported;Feet supported Sitting balance-Leahy Scale: Good       Standing balance-Leahy Scale: Fair                              Cognition Arousal/Alertness: Awake/alert Behavior During Therapy: WFL for tasks assessed/performed Overall Cognitive Status: Difficult to assess                                 General Comments: pt is aware that she has visual deficits on the Left, but has decreased awareness within a specific task.      Exercises      General Comments        Pertinent Vitals/Pain Pain Assessment: No/denies pain    Home Living                      Prior Function            PT Goals (current goals can now be found in the care plan section) Acute Rehab PT Goals PT Goal Formulation: With patient Time For Goal Achievement: 06/29/17 Potential to Achieve Goals: Good Progress towards PT goals: Progressing toward goals    Frequency    Min 3X/week      PT Plan Current plan remains appropriate    Co-evaluation              AM-PAC PT "6 Clicks" Daily Activity  Outcome Measure  Difficulty turning over in bed (including adjusting bedclothes, sheets and blankets)?: None Difficulty moving from lying on back to sitting on the side of the bed? : None Difficulty sitting down on and standing up from a chair with arms (e.g., wheelchair, bedside commode, etc,.)?: None Help needed moving to and from a bed to chair (including a wheelchair)?: A Little Help needed walking in hospital room?: A Little Help needed climbing 3-5 steps with a railing? : A Lot 6 Click Score: 20    End of Session   Activity Tolerance: Patient tolerated treatment well Patient left: in chair;with call bell/phone within reach Nurse Communication: Mobility status PT Visit Diagnosis: Unsteadiness on feet (R26.81);Muscle weakness (generalized) (M62.81)     Time: 6962-95281148-1212 PT Time Calculation (min) (ACUTE ONLY): 24 min  Charges:  $Gait  Training: 8-22 mins $Therapeutic Activity: 8-22 mins                    G Codes:       06/19/2017  [place] BingKen Syrai Gladwin, PT [phone]  (pager)   Eliseo GumKenneth V Darci Lykins 06/19/2017, 12:22 PM

## 2017-06-19 NOTE — Progress Notes (Signed)
Subjective: The patient was sitting up in her bed today upon entering the room.  She is agreeable to discharge home today, however, is concerned about some mild right-sided lower quadrant abdominal pain.  Patient states that the pain is localized to the right lower quadrant, and associated with constipation although she is not had significant p.o. intake for the past several days.  She was advised that she will be given close outpatient follow-up in our clinic and again in her own clinic and she will need close monitoring moving forward.  The patient has been secured an appointment with urology for the 18th which she has been advised of.  Objective:  Vital signs in last 24 hours: Vitals:   06/18/17 2102 06/19/17 0510 06/19/17 1016 06/19/17 1321  BP: (!) 164/68 (!) 173/65 (!) 174/77 (!) 144/71  Pulse: 71 72 65 71  Resp: 18 18  20   Temp: 98.6 F (37 C) 98.8 F (37.1 C)  98.7 F (37.1 C)  TempSrc: Oral Oral  Oral  SpO2: 97% 98% 100% 97%  Weight:      Height:       ROS negative except as per HPI.  Physical Exam: Cardiovascular: In no acute distress, conversant, communicates freely this a.m. Neuro: Alert and oriented x4, no focal deficits observed Cardiovascular: RRR, no murmurs rubs or gallops auscultated on exam Pulmonary: Lungs clear to auscultation bilaterally GI: There is acute focal tenderness in the right lower quadrant approximately 3 x 3 cm.  Abdomen is soft, nondistended and absent generalized tenderness.  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  Blood cultures were positive for E. coli and Proteus she improved symptomatically with ceftriaxone.  This was discontinued in favor of an oral option with cefdinir which she will be discharged home with for 14 days.   Plan: Sepsis secondary to pyelonephritis and left renal caliculi: Patient will be continued on Ceftin ear evaluation by urology.  We are hopeful that she will undergo removal of calculi prior to discontinuation of the antibiotics so that her such this does not again occur.  Abdominal pain: This appears to have resolved in favor of right lower quadrant abdominal pain is focal no obvious skin manifestations of musculoskeletal issues given the local nature of the pain is appears less likely to be visceral in nature and more likely somatic. She will need this evaluated once she is up and moving. She is absent peritoneal signs.  HTN: BP poorly controlled again this am. BP 144/71 --Continue Amlodipine 10mg  daily --Initiated low dose lisinopril at 10mg  daily  Insulin dependent diabetes mellitus: Blood glucose has been consistently ~100 with poor PO intake 2/2   Herpes labialis: Initiated treatment. Completed. Will monitor  Normocytic anemia: Persistent but stable at 11.7 this am. No indication to repeat  Hypokalemia: Potassium 3.2 this am. Will initiate ACEi and treat with PO potassium. Suspect this may be due to poor PO intake??. --Given two doses of PO 40mEq this am --She will need a BMP at her  visit on Friday, April 12th.   Diet: carb modified Code: full GI ppx: n/a DVT ppx: heparin Dispo: Anticipated discharge in approximately 0 day(s).   Lanelle Bal, MD 06/19/2017, 3:32 PM Pager: Pager# [phone]

## 2017-06-19 NOTE — Telephone Encounter (Signed)
Per Dr Crista ElliotHarbrecht f/u est care; patient appt 04/12 345pm

## 2017-06-19 NOTE — Discharge Instructions (Signed)
Please be sure to attend all of your appointments.   We have discontinued your insulin as you have not needed it while you were not eating. When you resume eating, you may need insulin. We ask that you continue to monitor your blood glucose levels for now and when you return to a clinic bring your meter with you so that they may assess how much insulin you need.  You will need to follow-up with Urology. Please do not miss this appointment that we have scheduled for April 18th. Please call them and confirm the time.  We have scheduled you an appointment for our clinic this Friday, April 12 at 3:45. Please bring your financial information with you so that you may apply for the [facility].

## 2017-06-19 NOTE — Progress Notes (Signed)
   Subjective:  Meghan Ford is having abdominal pain today, which she says hurts more when she turns. She says her last bowel movement was 2 days ago. Her appetite has been decreased during stay because of the pain in her mouth. She said she normally eats more vegetables and fruits at home, though she tries to limit fruits because of her diabetes. Denies pain on her L side.  Objective:  Vital signs in last 24 hours: Vitals:   06/18/17 2102 06/19/17 0510 06/19/17 1016 06/19/17 1321  BP: (!) 164/68 (!) 173/65 (!) 174/77 (!) 144/71  Pulse: 71 72 65 71  Resp: 18 18  20   Temp: 98.6 F (37 C) 98.8 F (37.1 C)  98.7 F (37.1 C)  TempSrc: Oral Oral  Oral  SpO2: 97% 98% 100% 97%  Weight:      Height:       Physical Exam: General: Resting in bed, NAD Cardio: RRR, no murmurs appreciated. Abd: Bowel sounds present. Soft, non-distended, tenderness to palpation in RLQ. No rebound tenderness, negative Rovsing's sign. Ext: Warm, no peripheral edema.   Assessment/Plan:  Principal Problem:   Severe sepsis (HCC) Active Problems:   Hypertension   Insulin dependent diabetes mellitus (HCC)   Anemia   Herpes labialis   Pyelonephritis   Renal stone   Hydronephrosis concurrent with and due to calculi of kidney and ureter  Meghan Ford is a 77 y.o. female with PMHx of IDDM, HTN, and history of multiple CVAs who presented to the ED with 3 days of somnolence.  Sepsis 2/2 pyelonephritis in the setting of L renal pelvic calculus: CT abdomen revealed 2.4 by 1.0 cm left renal pelvic calculus, not obstructing ureteropelvic junction. Afebrile, no L-sided flank pain. Continues to respond well after switch from IV ceftriaxone to oral cefdinir. - Continue Cefdinir 300mg  BID PO for 14 day course - Urology f/u appointment 4/18  Herpes labialis: Lesion on upper lip that began during admission. Painful, thus preventing her normal PO intake. Completed valacyclovir 2g BID one day course 4/8.  RUQ abdominal  pain: RUQ ultrasound showed mildly distended gallbladder without cholelithiasis, possible biliary dyskinesis. Patient endorsing abdominal pain today, isolated more toward RLQ. No peritoneal signs.  - Continue to monitor outpatient - Per radiology, HIDA scan recommended after discharge  HTN: Hypertensive 140s-170s during most of stay. - Start lisinopril 10mg  daily - Continue home amlodipine 10mg   Insulin-dependent diabetes: Blood glucose 120s-160s today. Decreased PO intake compared to home. - Continue to hold home Lantus 35U BID - Follow up with clinic 4/12  Normocytic Anemia: Hgb 11.7 today, trending upward from 10.9 on 4/7. Patient is asymptomatic.  Diet: Regular  DVT PPx: SQ heparin  Code Status: Full  Dispo: Anticipate discharge today with home health and PT.   LOS: 5 days   Carie CaddyGurjar, Fahim Kats S, Medical Student 06/19/2017, 1:27 PM

## 2017-06-19 NOTE — Progress Notes (Signed)
Nsg Discharge Note  Admit Date:  06/14/2017 Discharge date: 06/19/2017   Meghan Ford to be D/C'd Home per MD order.  AVS completed.  Copy for chart, and copy for patient signed, and dated. Patient/caregiver able to verbalize understanding.  Discharge Medication: Allergies as of 06/19/2017   No Known Allergies     Medication List    STOP taking these medications   baclofen 10 MG tablet Commonly known as:  LIORESAL   Butalbital-APAP-Caffeine 50-300-40 MG Caps Commonly known as:  FIORICET   insulin glargine 100 UNIT/ML injection Commonly known as:  LANTUS     TAKE these medications   amLODipine 10 MG tablet Commonly known as:  NORVASC Take 1 tablet (10 mg total) by mouth daily.   aspirin 81 MG tablet Take 1 tablet (81 mg total) by mouth daily. What changed:    medication strength  how much to take   atorvastatin 80 MG tablet Commonly known as:  LIPITOR Take 1 tablet (80 mg total) by mouth daily.   blood glucose meter kit and supplies Kit Dispense based on patient and insurance preference. Use up to four times daily as directed. (FOR ICD-9 250.00, 250.01).   cefdinir 300 MG capsule Commonly known as:  OMNICEF Take 1 capsule (300 mg total) by mouth every 12 (twelve) hours.   clopidogrel 75 MG tablet Commonly known as:  PLAVIX Take 1 tablet (75 mg total) by mouth daily.   gabapentin 300 MG capsule Commonly known as:  NEURONTIN Take 1 capsule (300 mg total) by mouth at bedtime.   glucose blood test strip Use as instructed   glucose blood test strip Commonly known as:  TRUE METRIX BLOOD GLUCOSE TEST Use 3 times daily before meals   ibuprofen 200 MG tablet Commonly known as:  ADVIL,MOTRIN Take 200 mg by mouth every 6 (six) hours as needed for moderate pain.   isosorbide mononitrate 60 MG 24 hr tablet Commonly known as:  IMDUR Take 1 tablet (60 mg total) by mouth daily.   lisinopril 10 MG tablet Commonly known as:  PRINIVIL,ZESTRIL Take 1  tablet (10 mg total) by mouth daily.   senna-docusate 8.6-50 MG tablet Commonly known as:  Senokot-S Take 1 tablet by mouth at bedtime as needed for mild constipation.   TRUE METRIX METER Devi 1 each by Does not apply route 3 (three) times daily before meals.   TRUEPLUS LANCETS 28G Misc 1 each by Does not apply route 3 (three) times daily before meals.       Discharge Assessment: Vitals:   06/19/17 1016 06/19/17 1321  BP: (!) 174/77 (!) 144/71  Pulse: 65 71  Resp:  20  Temp:  98.7 F (37.1 C)  SpO2: 100% 97%   Skin clean, dry and intact without evidence of skin break down, no evidence of skin tears noted. IV catheter discontinued intact. Site without signs and symptoms of complications - no redness or edema noted at insertion site, patient denies c/o pain - only slight tenderness at site.  Dressing with slight pressure applied.  D/c Instructions-Education: Discharge instructions given to patient/family with verbalized understanding. D/c education completed with patient/family including follow up instructions, medication list, d/c activities limitations if indicated, with other d/c instructions as indicated by MD - patient able to verbalize understanding, all questions fully answered. Patient instructed to return to ED, call 911, or call MD for any changes in condition.  Patient escorted via [place], and D/C home via private auto.  Salley Slaughter, RN 06/19/2017  5:17 PM

## 2017-06-19 NOTE — Evaluation (Signed)
Occupational Therapy Evaluation Patient Details Name: Meghan FormMarcelina Escobar Ford MRN: [id number] DOB: 1940-08-08 Today's Date: 06/19/2017    History of Present Illness Patient is a 77 y.o. F  with significant PMH of insulin-dependent diabetes, HLD, HTN, and previous CVA with residual L sided weakness, and peripheral vascular disease who presents for 3 days of somnolence per patient daughter. Patient experiencing L sided flank pain, increasing heaviness of head, fever, chills, diaphoresis, and constipation. CT of abdomen and pelvis showing left sided renal stone with mild hydronephrosis.    Clinical Impression   Pt requires min A - min guard A for LB ADLs and ADL mobility due to decreased balance. Pt will have assist at home from family. All education completed and no further acute OT is indicated at this time. Pt to continue with acute PT services for functional mobility safety    Follow Up Recommendations  No OT follow up;Supervision - Intermittent    Equipment Recommendations  None recommended by OT    Recommendations for Other Services       Precautions / Restrictions Precautions Precautions: Fall Restrictions Weight Bearing Restrictions: No      Mobility Bed Mobility               General bed mobility comments: sitting up on arrival  Transfers Overall transfer level: Needs assistance Equipment used: 1 person hand held assist Transfers: Sit to/from Stand Sit to Stand: Min assist Stand pivot transfers: Min guard       General transfer comment: cues for hand placement and help to come forward.    Balance Overall balance assessment: Needs assistance Sitting-balance support: No upper extremity supported;Feet supported Sitting balance-Leahy Scale: Good     Standing balance support: Single extremity supported;During functional activity Standing balance-Leahy Scale: Fair                             ADL either performed or assessed with clinical  judgement   ADL Overall ADL's : Needs assistance/impaired Eating/Feeding: Set up;Sitting   Grooming: Wash/dry hands;Wash/dry face;Min guard;Standing   Upper Body Bathing: Set up;Sitting   Lower Body Bathing: Minimal assistance   Upper Body Dressing : Set up;Sitting   Lower Body Dressing: Minimal assistance   Toilet Transfer: Min guard;Minimal assistance;Stand-pivot;BSC   Toileting- Clothing Manipulation and Hygiene: Min guard       Functional mobility during ADLs: Min guard;Minimal assistance General ADL Comments: min - min guard A due to decreased balance     Vision Baseline Vision/History: Wears glasses Wears Glasses: Reading only Patient Visual Report: No change from baseline       Perception     Praxis      Pertinent Vitals/Pain Pain Assessment: No/denies pain Faces Pain Scale: No hurt Pain Intervention(s): Monitored during session     Hand Dominance Left   Extremity/Trunk Assessment Upper Extremity Assessment Upper Extremity Assessment: Overall WFL for tasks assessed;LUE deficits/detail RUE Deficits / Details: WFL LUE Deficits / Details: Residual left sided weakness from previous CVA   Lower Extremity Assessment Lower Extremity Assessment: Defer to PT evaluation   Cervical / Trunk Assessment Cervical / Trunk Assessment: Normal   Communication Communication Communication: Prefers language other than English   Cognition Arousal/Alertness: Awake/alert Behavior During Therapy: WFL for tasks assessed/performed Overall Cognitive Status: Difficult to assess  General Comments: pt is aware that she has visual deficits on the Left, but has decreased awareness within a specific task.   General Comments       Exercises     Shoulder Instructions      Home Living Family/patient expects to be discharged to:: Private residence Living Arrangements: Children Available Help at Discharge: Family;Available 24  hours/day Type of Home: House Home Access: Level entry     Home Layout: Able to live on main level with bedroom/bathroom     Bathroom Shower/Tub: Chief Strategy Officer: Standard Bathroom Accessibility: Yes   Home Equipment: Bedside commode;Cane - single point;Shower seat   Additional Comments: Patient lives with son      Prior Functioning/Environment Level of Independence: Independent with assistive device(s)        Comments: Uses SPC for ambulation occasionally per patient son        OT Problem List: Decreased strength;Decreased activity tolerance;Pain;Impaired balance (sitting and/or standing)      OT Treatment/Interventions:      OT Goals(Current goals can be found in the care plan section) Acute Rehab OT Goals Patient Stated Goal: go home OT Goal Formulation: With patient  OT Frequency:     Barriers to D/C:            Co-evaluation              AM-PAC PT "6 Clicks" Daily Activity     Outcome Measure Help from another person eating meals?: None Help from another person taking care of personal grooming?: None Help from another person toileting, which includes using toliet, bedpan, or urinal?: A Little Help from another person bathing (including washing, rinsing, drying)?: A Little Help from another person to put on and taking off regular upper body clothing?: None Help from another person to put on and taking off regular lower body clothing?: A Little 6 Click Score: 21   End of Session Equipment Utilized During Treatment: Gait belt;Other (comment)(BSC)  Activity Tolerance: Patient tolerated treatment well Patient left: in chair  OT Visit Diagnosis: Unsteadiness on feet (R26.81);Muscle weakness (generalized) (M62.81);Pain Pain - part of body: (flank)                Time: 2440-1027 OT Time Calculation (min): 26 min Charges:  OT General Charges $OT Visit: 1 Visit OT Evaluation $OT Eval Moderate Complexity: 1 Mod OT  Treatments $Therapeutic Activity: 8-22 mins G-Codes: OT G-codes **NOT FOR INPATIENT CLASS** Functional Assessment Tool Used: AM-PAC 6 Clicks Daily Activity     Galen Manila 06/19/2017, 1:09 PM

## 2017-06-19 NOTE — Discharge Summary (Signed)
Name: Meghan Ford MRN: [id number] DOB: [birth year] 77 y.o. PCP: Charlott Rakes, MD  Date of Admission: 06/14/2017  7:16 AM Date of Discharge: 06/19/2017 Attending Physician: Dr. Lalla Brothers  Discharge Diagnosis: Principal Problem:   Sepsis [facility]) Active Problems:   Hypertension   Insulin dependent diabetes mellitus ([place])   Anemia   Herpes labialis   Pyelonephritis   Renal stone  Discharge Medications: Allergies as of 06/19/2017   No Known Allergies     Medication List    STOP taking these medications   baclofen 10 MG tablet Commonly known as:  LIORESAL   Butalbital-APAP-Caffeine 50-300-40 MG Caps Commonly known as:  FIORICET   insulin glargine 100 UNIT/ML injection Commonly known as:  LANTUS     TAKE these medications   amLODipine 10 MG tablet Commonly known as:  NORVASC Take 1 tablet (10 mg total) by mouth daily.   aspirin 81 MG tablet Take 1 tablet (81 mg total) by mouth daily. What changed:    medication strength  how much to take   atorvastatin 80 MG tablet Commonly known as:  LIPITOR Take 1 tablet (80 mg total) by mouth daily.   blood glucose meter kit and supplies Kit Dispense based on patient and insurance preference. Use up to four times daily as directed. (FOR ICD-9 250.00, 250.01).   cefdinir 300 MG capsule Commonly known as:  OMNICEF Take 1 capsule (300 mg total) by mouth every 12 (twelve) hours.   clopidogrel 75 MG tablet Commonly known as:  PLAVIX Take 1 tablet (75 mg total) by mouth daily.   gabapentin 300 MG capsule Commonly known as:  NEURONTIN Take 1 capsule (300 mg total) by mouth at bedtime.   glucose blood test strip Use as instructed   glucose blood test strip Commonly known as:  TRUE METRIX BLOOD GLUCOSE TEST Use 3 times daily before meals   ibuprofen 200 MG tablet Commonly known as:  ADVIL,MOTRIN Take 200 mg by mouth every 6 (six) hours as needed for moderate pain.   isosorbide mononitrate 60 MG 24  hr tablet Commonly known as:  IMDUR Take 1 tablet (60 mg total) by mouth daily.   lisinopril 10 MG tablet Commonly known as:  PRINIVIL,ZESTRIL Take 1 tablet (10 mg total) by mouth daily.   senna-docusate 8.6-50 MG tablet Commonly known as:  Senokot-S Take 1 tablet by mouth at bedtime as needed for mild constipation.   TRUE METRIX METER Devi 1 each by Does not apply route 3 (three) times daily before meals.   TRUEPLUS LANCETS 28G Misc 1 each by Does not apply route 3 (three) times daily before meals.      Disposition and follow-up:   [name] was discharged from [facility] in Stable condition.  At the hospital follow up visit please address:  1.  Renal stone: please be sure she follows with Urology. High risk of recurrence of her urosepsis if this is not treated.      Insulin: Held this admission with CBG~100. Please view glucometer readings      Please contact health and wellness with regard to why she is taking ASA and Plavix      Hypokalemia: Unclear etiology-please follow up with BMP      Normocytic anemia: Stable but persistent, CBC at clinic visit?      Abdominal Pain: RLQ, focal somatic in nature, please assess need for GI f/up if persistent   2.  Labs / imaging needed at time of  follow-up: BMP, CBC?  3.  Pending labs/ test needing follow-up: N/A  Follow-up Appointments: Follow-up Information    [facility]. Go in 9 day(s).   Why:  Scheduled for 12:30 on April 18th. Please be sure to make this appointment as they are difficult to obtain. Contact information: [place] Follow up on 07/05/2017.   Why:  10 AM Contact information: [place] [address] [place], [facility] Follow up.   Specialty:  Home Health Services Why:  home health  services arranged Contact information: [address] [place] [address] [phone]          Hospital Course by problem list: Principal Problem:   Sepsis [facility]) Active Problems:   Hypertension   Insulin dependent diabetes mellitus ([place])   Anemia   Herpes labialis   Pyelonephritis   Renal stone   1. Sepsis secondary to pyelonephritis in the setting of mild hydronephritis as a result of a renal caliculi in the renal pelvis: Meghan Ford is a 77 yo F with known HTN, HLD, insulin dependent diabetes, PVD and previous CVA who presented in a state of somnolence that began the evening prior to admission. This was associated with progressively worsening generalized illness, fever, chills, nausea and abdominal pain of three days. She denied urinary symptoms, but given her fever, leukocytosis,  initial tachycardia/hypotension, lactic acidosis, UA, somnolence, and renal calculi noted on CT w/ associated hydronephrosis she was admitted for urosepsis. Urology was consulted who agreed to see her at their office after she was medically stable. She improved rapidly over the course of her stay but developed significant deconditioning requiring home health and PT. This was graciously arranged by the hospital CSW and made available for upon discharge.   2. Abdominal pain: The RUQ pain appears to have resolved or migrated in favor of right lower quadrant abdominal pain which is focal and absent obvious skin manifestations. I suspect musculoskeletal pain to be the source given the focal nature of the pain and as it appears less likely to be visceral in nature and more likely somatic. She will need this evaluated once she is up and moving. She was absent peritoneal signs.  3. Insulin dependent diabetes mellitus:  Patients blood glucose averaged ~100 with poor PO intake without insulin. Please address her insulin needs once she has returned to a normal diet.   4. 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   Discharge Vitals:   BP (!) 174/77 (BP Location: Right Arm)   Pulse 65   Temp 98.8 F (37.1 C) (Oral)   Resp 18   Ht '5\' 1"'$  (1.549 m)   Wt 210 lb 15.7 oz (95.7 kg)   SpO2 100%   BMI 39.86 kg/m   Pertinent Labs, Studies, and Procedures:  CMP Latest Ref Rng & Units 06/19/2017 06/16/2017 06/15/2017  Glucose 65 - 99 mg/dL 146(H) 98 69  BUN 6 - 20 mg/dL '12 15 19  '$ Creatinine 0.44 - 1.00 mg/dL 0.87 0.87 0.97  Sodium 135 - 145 mmol/L 141 141 143  Potassium 3.5 - 5.1 mmol/L 3.2(L) 3.8 3.5  Chloride 101 - 111 mmol/L 107 109 110  CO2 22 - 32 mmol/L 25 23 20(L)  Calcium 8.9 - 10.3 mg/dL 8.9 8.6(L) 8.0(L)  Total Protein 6.5 - 8.1  g/dL - 6.6 -  Total Bilirubin 0.3 - 1.2 mg/dL - 0.9 -  Alkaline Phos 38 - 126 U/L - 75 -  AST 15 - 41 U/L - 31 -  ALT 14 - 54 U/L - 14 -   CBC Latest Ref Rng & Units 06/19/2017 06/17/2017 06/16/2017  WBC 4.0 - 10.5 K/uL 7.0 13.3(H) 25.7(H)  Hemoglobin 12.0 - 15.0 g/dL 11.7(L) 10.9(L) 13.4  Hematocrit 36.0 - 46.0 % 38.2 35.7(L) 42.9  Platelets 150 - 400 K/uL 283 273 236   US Abdomen: IMPRESSION: 1. Mildly distended gallbladder without evidence of cholelithiasis or cholecystitis. 2. Upper normal caliber common bile duct without evidence of choledocholithiasis. 3. Normal appearing liver.  CT abdomen: IMPRESSION: 1. 2.4 by 1.0 cm stone in the left renal pelvis. Although this does not appear currently obstructive, there is notable renal pelvis wall thickening, perirenal stranding, and distention of the renal pelvis. This stone could be intermittently obstructing to cause this kind of appearance. IV contrast was not administered. 2. Nonspecific 8 mm exophytic lesion from the right mid upper kidney anteriorly, technically too small to characterize. 3. Other imaging findings of potential clinical significance: Aortic Atherosclerosis (ICD10-I70.0). Cardiomegaly. Mild mitral valve calcification. Small type 1 hiatal hernia.  Mild dependent atelectasis in the lung bases. Sigmoid colon diverticulosis. Lower lumbar foraminal impingement due to spondylosis and degenerative disc disease.  DG Chest: IMPRESSION: Enlargement of cardiac silhouette with vascular congestion. Bronchitic changes with bibasilar atelectasis.  Discharge Instructions: Discharge Instructions    Call MD for:  persistant dizziness or light-headedness   Complete by:  As directed    Call MD for:  persistant nausea and vomiting   Complete by:  As directed    Call MD for:  severe uncontrolled pain   Complete by:  As directed    Diet - low sodium heart healthy   Complete by:  As directed    Increase activity slowly   Complete by:  As directed      Signed: Kathi Ludwig, MD 06/19/2017, 11:32 AM   Pager: Pager# [phone]

## 2017-06-21 ENCOUNTER — Telehealth: Payer: Self-pay | Admitting: Family Medicine

## 2017-06-21 NOTE — Telephone Encounter (Signed)
Physical therapist Isaias Cowmanllan called to request verbal orders for Home health physical therapy and skilled nursing visits. Please follow up [phone]

## 2017-06-22 ENCOUNTER — Ambulatory Visit: Payer: Self-pay

## 2017-06-22 NOTE — Telephone Encounter (Signed)
Okay to give verbal order.

## 2017-06-25 ENCOUNTER — Telehealth: Payer: Self-pay | Admitting: *Deleted

## 2017-06-25 NOTE — Telephone Encounter (Signed)
Manual transmission successfully received.  All "AF" episodes appear false--SR w/PACs.  ECGs printed and placed in Dr. Mickey Farberamnitz's LINQ folder for review.

## 2017-06-25 NOTE — Telephone Encounter (Signed)
LMOVM (DPR) for patient's son, Meghan Ford, requesting manual Carelink transmission for review.  Gave [facility] phone number for questions/concerns.

## 2017-06-25 NOTE — Telephone Encounter (Signed)
Meghan Ford was informed it was okay to give a verbal order per pcp.

## 2017-06-26 ENCOUNTER — Ambulatory Visit (INDEPENDENT_AMBULATORY_CARE_PROVIDER_SITE_OTHER): Payer: Self-pay | Admitting: Internal Medicine

## 2017-06-26 VITALS — BP 153/72 | HR 68 | Temp 98.2°F | Wt 199.7 lb

## 2017-06-26 DIAGNOSIS — D649 Anemia, unspecified: Secondary | ICD-10-CM

## 2017-06-26 DIAGNOSIS — Z87442 Personal history of urinary calculi: Secondary | ICD-10-CM

## 2017-06-26 DIAGNOSIS — Z7982 Long term (current) use of aspirin: Secondary | ICD-10-CM

## 2017-06-26 DIAGNOSIS — E785 Hyperlipidemia, unspecified: Secondary | ICD-10-CM

## 2017-06-26 DIAGNOSIS — E1151 Type 2 diabetes mellitus with diabetic peripheral angiopathy without gangrene: Secondary | ICD-10-CM

## 2017-06-26 DIAGNOSIS — N132 Hydronephrosis with renal and ureteral calculous obstruction: Secondary | ICD-10-CM

## 2017-06-26 DIAGNOSIS — Z794 Long term (current) use of insulin: Secondary | ICD-10-CM

## 2017-06-26 DIAGNOSIS — R109 Unspecified abdominal pain: Secondary | ICD-10-CM

## 2017-06-26 DIAGNOSIS — E876 Hypokalemia: Secondary | ICD-10-CM

## 2017-06-26 DIAGNOSIS — IMO0002 Reserved for concepts with insufficient information to code with codable children: Secondary | ICD-10-CM

## 2017-06-26 DIAGNOSIS — Z87448 Personal history of other diseases of urinary system: Secondary | ICD-10-CM

## 2017-06-26 DIAGNOSIS — R1011 Right upper quadrant pain: Secondary | ICD-10-CM

## 2017-06-26 DIAGNOSIS — I6340 Cerebral infarction due to embolism of unspecified cerebral artery: Secondary | ICD-10-CM

## 2017-06-26 DIAGNOSIS — Z8673 Personal history of transient ischemic attack (TIA), and cerebral infarction without residual deficits: Secondary | ICD-10-CM

## 2017-06-26 DIAGNOSIS — E1165 Type 2 diabetes mellitus with hyperglycemia: Secondary | ICD-10-CM

## 2017-06-26 DIAGNOSIS — Z79899 Other long term (current) drug therapy: Secondary | ICD-10-CM

## 2017-06-26 DIAGNOSIS — I10 Essential (primary) hypertension: Secondary | ICD-10-CM

## 2017-06-26 MED ORDER — ASPIRIN 81 MG PO TABS: 325.0000 mg | ORAL_TABLET | Freq: Every day | ORAL | 11 refills | Status: DC

## 2017-06-26 MED FILL — ATORVASTATIN 80 MG TABLET: 80 | 30 days supply | Qty: 30 | Fill #1

## 2017-06-26 MED FILL — GABAPENTIN 300 MG CAPSULE: 300 | 30 days supply | Qty: 30 | Fill #1

## 2017-06-26 NOTE — Assessment & Plan Note (Signed)
Assessment Normocytic anemia that has been stable. Hb 11.7 on discharge. No active bleeding - will not repeat CBC for now, as it has only been 5 days since her last CBC and have her f/u with PCP for further management  Plan - F/u with PCP 4/25

## 2017-06-26 NOTE — Assessment & Plan Note (Addendum)
Assessment K 3.2 on discharge, likely related to poor PO intake. Patient reports PO intake has improved since discharge. She was also discharged on lisinopril, however has not actually started taking this medication.  Plan - Recheck BMP  ADDENDUM: K 4.8

## 2017-06-26 NOTE — Patient Instructions (Signed)
Meghan BucySra. Meghan Ford,  Fue un placer conocerte hoy.  Contine tomando la amlodipina 10 mg al da, lisinopril 10 mg al da y la dosis diaria de 60 mg para la presin arterial.  Por favor, asegrese de Publishing rights managertomar el cefdinir (antibitico) dos veces al C.H. Robinson Worldwideda.  Por favor contine tomando Lantus 35 unidades dos veces al da.  Por favor contine con la aspirina por ahora. Enviar un mensaje a su mdico de cabecera para ver si quieren que tome tanto la aspirina como el plavix o solo la aspirina.  Estamos revisando algunos anlisis de [place] hoy. Te C.H. Robinson Worldwidehar saber los resultados.  Por favor, asegrese de ver a su mdico de cabecera y al mdico de Personal assistanturologa.     [name],  It was a pleasure to meet you today.  Please continue to take the amlodipine 10mg  daily, lisinopril 10mg  daily, and imdur 60mg  daily for your blood pressure.  Please make sure to take the cefdinir (antibiotic) twice a day.  Please continue to take Lantus 35 units twice a day.  Please continue the aspirin for now. I will send a message to your primary doctor to see whether they want you to take both the aspirin and the plavix or just the aspirin.  We are checking some bloodwork today. I will let you know the results.  Please make sure to see your primary doctor and the Urology doctor.

## 2017-06-26 NOTE — Assessment & Plan Note (Addendum)
Assessment BP elevated at 153/72, however patient has only been taking Imdur 60mg  daily. She was discharged with amlodipine 10mg  daily and lisinopril 10mg  daily in addition to the imdur, however the patient states that she just picked up the two new medications and has not yet started taking them. Will not make changes to medications today and have her f/u with PCP  Plan - Continue lisinopril 10mg  daily, amlodipine 10mg  daily, and imdur 60mg  daily - F/u with PCP 4/25  ADDENDUM: K normalized at 4.8. Daughter-in-law reports that she had just started taking lisinopril yesterday, so may need another BMP check soon.

## 2017-06-26 NOTE — Telephone Encounter (Signed)
Patient r/s appt from 06/22/2017 to today at 3:45. Kinnie FeilL. Little Winton, RN, BSN

## 2017-06-26 NOTE — Assessment & Plan Note (Signed)
Assessment Admitted 4/4-4/9 for urosepsis secondary to pyelonephritis with renal calculi and hydronephrosis, treated with IV ceftriaxone, which was transitioned to cefdinir on discharge. Urology recommended outpatient management once medically stable. Since discharge, she has been doing well - denies fevers, dysuria, or other urinary symptoms. She just picked up the cefdinir and has not started taking this yet. She has a Urology appointment set up for 4/18.   Plan - F/u with Urology 4/18 - Emphasized importance of taking cefdinir 300mg  q12h

## 2017-06-26 NOTE — Assessment & Plan Note (Signed)
Assessment A1c 12.7 in 04/2017. BG while inpatient was in the 100s without insulin therapy. There was a question of compliance with her insulin. She is supposed to be on Lantus 35u BID. She was discharged with the instructions to hold her Lantus until follow-up, however the patient has continued to take the Lantus 35u BID. She did not bring her glucometer to today's visit, however daughter-in-law reports that BG are in the 120-150 range without hypoglycemic episodes. Will have her continue Lantus and follow up with PCP for further management.  Plan - Lantus 35u BID - Follow up with PCP 4/25

## 2017-06-26 NOTE — Assessment & Plan Note (Signed)
Assessment Admitted for watershed infarcts in 10/2016 and it appears that she was started on aspirin and plavix at that time given that she was already on aspirin prior to that admission. She was then seen again in 04/2017 for another CVA and was again discharged with aspirin and plavix per Neurology recs. Patient reports she is only taking aspirin 325mg  daily because someone came to her house and said that she did not need to take both because they do the same thing. There was a question of compliance with the medications, although daughter-in-law states that patient has been taking aspirin daily since discharge recently. Will continue aspirin 325mg  daily for now and will contact patient's PCP to have them further discuss whether she needs to be on both aspirin 81mg  daily and plavix 75mg  daily or whether aspirin 325mg  daily will suffice.  Plan - Continue aspirin 325mg  daily - Will contact PCP for question whether patient should be on DAPT - Hold plavix 75mg  daily (will keep this on medication list as she will follow up with PCP for this) - F/u with PCP on 4/25

## 2017-06-26 NOTE — Progress Notes (Signed)
   CC: follow up of sepsis secondary to pyelonephritis  HPI:  [name] is a 77 y.o. female with PMH of HTN, DM2, CVA, PVD, and HLD who presents as a hospital follow-up. Daughter-in-law at bedside, who assists with interpretation and history.  She was recently admitted 4/4-4/9 for sepsis 2/2 left pyelonephritis with renal calculi and associated hydronephrosis. Urology was consulted and plan to see her as an outpatient on 4/18 when medically stable. [facility] Noted to have hypokalemia to 3.2 on discharge, as well as normocytic anemia with Hb 11.7. [facility] is an [facility] and has an appointment to see them on 4/25, she was set up for this visit as a one-time hospital follow-up.  Since discharge, she reports she has been doing well. She denies dysuria or other urinary symptoms. She denies subjective fevers/chills. She has been eating and drinking okay. She does endorse intermittent sharp RLQ pain that is similar to the pain she had during her admission - she had a RUQ U/S during her admission that showed mildly distended gallbladder without cholelithiasis or cholecystitis or choledocholithiasis. Recommendation was to perform non-emergent HIDA scan if indicated and possible follow up with GI outpatient.  She is not sure what medications she is supposed be taking. She was told to hold her insulin on discharge, however she has continued to take Lantus 35 units BID. She did not bring her glucometer today but her daughter-in-law reports that her blood glucose has been in the 120-150s. She has not had any hypoglycemic episodes since discharge.  Her daughter-in-law sets up her pill box and insulin and she checks the insulin herself. Her daughter-in-law does check to make sure that each day's medications have been taken.  She just picked up the amlodipine, lisinopril, and Ceftin year and has not started taking these yet. She is  only taking aspirin 325 mg once a day and is not taking the Plavix currently because she was told by the nurse who came to her house that they do the same thing and that she did not need both.  Past Medical History:  Diagnosis Date  . Diabetes mellitus type II, uncontrolled (HCC)   . Diabetes mellitus without complication (HCC)   . Fx humeral neck   . Hyperlipidemia LDL goal < 100   . Hypertension   . Leg pain    worse with prolonged standing  . Peripheral vascular disease (HCC)   . Pyelonephritis 06/14/2017  . Sepsis (HCC) 06/14/2017  . Stroke [facility](HCC) 2018; 05/08/2017   denies residual from 2018 stroke on 05/08/2017; weakness on left; speech issues from today's stroke (05/08/2017)  . Varicose veins    Review of Systems:   Negative except as per HPI  Physical Exam:  Vitals:   06/26/17 1532  BP: (!) 153/72  Pulse: 68  Temp: 98.2 F (36.8 C)  TempSrc: Oral  SpO2: 99%  Weight: 199 lb 11.2 oz (90.6 kg)   GEN: Elderly female sitting in chair in NAD CV: NR & RR, no m/r/g PULM: CTAB, no wheezes or rales ABD: Mild TTP of RLQ/flank, non-tender elsewhere. +BS. Non-distended. MSK: Trace BLE edema  Assessment & Plan:   See Encounters Tab for problem based charting.  Patient discussed with Dr. Rogelia BogaButcher

## 2017-06-26 NOTE — Assessment & Plan Note (Signed)
Assessment Continues to endorse intermittent sharp R sided abdominal pain. RUQ U/S during her admission showed mildly distended gallbladder without cholelithiasis or cholecystitis or choledocholithiasis. Recommendation was to perform non-emergent HIDA scan if indicated and possible follow up with GI outpatient. Will have patient see PCP for further management of this.  Plan - F/u with PCP 4/25

## 2017-06-27 LAB — BMP8+ANION GAP
ANION GAP: 15 mmol/L (ref 10.0–18.0)
BUN/Creatinine Ratio: 23 (ref 12–28)
BUN: 19 mg/dL (ref 8–27)
CALCIUM: 9.4 mg/dL (ref 8.7–10.3)
CO2: 25 mmol/L (ref 20–29)
CREATININE: 0.81 mg/dL (ref 0.57–1.00)
Chloride: 102 mmol/L (ref 96–106)
GFR calc Af Amer: 83 mL/min/{1.73_m2} (ref >59)
GFR, EST NON AFRICAN AMERICAN: 72 mL/min/{1.73_m2} (ref >59)
Glucose: 116 mg/dL — ABNORMAL HIGH (ref 65–99)
POTASSIUM: 4.8 mmol/L (ref 3.5–5.2)
Sodium: 142 mmol/L (ref 134–144)

## 2017-06-28 NOTE — Progress Notes (Signed)
Internal Medicine Clinic Attending  Case discussed with Dr. Huang at the time of the visit.  We reviewed the resident's history and exam and pertinent patient test results.  I agree with the assessment, diagnosis, and plan of care documented in the resident's note. 

## 2017-07-05 ENCOUNTER — Ambulatory Visit: Payer: Self-pay | Attending: Family Medicine | Admitting: Physician Assistant

## 2017-07-05 VITALS — BP 144/77 | HR 60 | Temp 98.2°F | Resp 18 | Ht 64.0 in | Wt 201.6 lb

## 2017-07-05 DIAGNOSIS — Z09 Encounter for follow-up examination after completed treatment for conditions other than malignant neoplasm: Secondary | ICD-10-CM

## 2017-07-05 DIAGNOSIS — Z8673 Personal history of transient ischemic attack (TIA), and cerebral infarction without residual deficits: Secondary | ICD-10-CM | POA: Insufficient documentation

## 2017-07-05 DIAGNOSIS — E119 Type 2 diabetes mellitus without complications: Secondary | ICD-10-CM | POA: Insufficient documentation

## 2017-07-05 DIAGNOSIS — Z79899 Other long term (current) drug therapy: Secondary | ICD-10-CM | POA: Insufficient documentation

## 2017-07-05 DIAGNOSIS — I10 Essential (primary) hypertension: Secondary | ICD-10-CM

## 2017-07-05 DIAGNOSIS — IMO0002 Reserved for concepts with insufficient information to code with codable children: Secondary | ICD-10-CM

## 2017-07-05 DIAGNOSIS — Z7902 Long term (current) use of antithrombotics/antiplatelets: Secondary | ICD-10-CM | POA: Insufficient documentation

## 2017-07-05 DIAGNOSIS — A4151 Sepsis due to Escherichia coli [E. coli]: Secondary | ICD-10-CM

## 2017-07-05 DIAGNOSIS — Z7982 Long term (current) use of aspirin: Secondary | ICD-10-CM | POA: Insufficient documentation

## 2017-07-05 DIAGNOSIS — Z794 Long term (current) use of insulin: Secondary | ICD-10-CM

## 2017-07-05 DIAGNOSIS — E1165 Type 2 diabetes mellitus with hyperglycemia: Secondary | ICD-10-CM

## 2017-07-05 DIAGNOSIS — N136 Pyonephrosis: Secondary | ICD-10-CM | POA: Insufficient documentation

## 2017-07-05 DIAGNOSIS — N12 Tubulo-interstitial nephritis, not specified as acute or chronic: Secondary | ICD-10-CM

## 2017-07-05 LAB — GLUCOSE, POCT (MANUAL RESULT ENTRY): POC GLUCOSE: 130 mg/dL — AB (ref 70–99)

## 2017-07-05 NOTE — Progress Notes (Signed)
Meghan Ford  YSA:[id number]  NAT:[id number]  DOB - 05/08/1940  Chief Complaint  Patient presents with  . Hospitalization Follow-up       Subjective:   Meghan Ford is a 77 y.o. female here today for hospital follow up. She was hospitalized for 4 2019 through 06/19/2017. She presented with lethargy, somnolence, left flank pain and a headache. She also had been having some vomiting. Her creatinine on presentation was 1.5. Potassium 3.4. Urinalysis was markedly abnormal. Lactic acid was 4.45. CT scan of the abdomen and pelvis showed a 2.4 x 1 cm renal stone with mild hydronephrosis on the left.  She was admitted by the internal medicine team with a working diagnosis of urosepsis/pyelonephritis. She was seen by urology with plans for outpatient follow-up. She was seen by interventional radiology for the potential need of nephrostomy tube placement which was not needed. Her blood cultures grew Escherichia coli. She was placed on antibiotics. Her hospital course was complicated by deconditioning and poor oral intake.  She has followed up with the [facility] medicine clinic on 06/26/2017. Labs were reassessed and within normal limits particularly her potassium. She was also seen by urology on 06/28/2017. She has an outpatient procedure pending.  Some right abdomen/side pain today. Better with Advil. Blood sugars are between 120 and 150. Abdominal pain and flank pain has lessened. No nausea or vomiting. Appetite is increasing. She is sleeping okay. Takes Advil as needed for pain. Home health physical therapy has been coming out. I believe this is her last week.   ROS: GEN: denies fever or chills, denies change in weight Skin: denies lesions or rashes HEENT: denies headache, earache, epistaxis, sore throat, or neck pain LUNGS: denies SHOB, dyspnea, PND, orthopnea CV: denies CP or palpitations ABD: denies abd pain, N or V EXT: denies muscle spasms or  swelling; no pain in lower ext, no weakness NEURO: denies numbness or tingling, denies sz, +stroke  ALLERGIES: No Known Allergies   MEDICATIONS AT HOME: Prior to Admission medications   Medication Sig Start Date End Date Taking? Authorizing Provider  amLODipine (NORVASC) 10 MG tablet Take 1 tablet (10 mg total) by mouth daily. 05/14/17  Yes Charlott Rakes, MD  aspirin 81 MG tablet Take 4 tablets (325 mg total) by mouth daily. 06/26/17  Yes Colbert Ewing, MD  atorvastatin (LIPITOR) 80 MG tablet Take 1 tablet (80 mg total) by mouth daily. 05/14/17  Yes Charlott Rakes, MD  blood glucose meter kit and supplies KIT Dispense based on patient and insurance preference. Use up to four times daily as directed. (FOR ICD-9 250.00, 250.01). 11/09/16  Yes Hosie Poisson, MD  Blood Glucose Monitoring Suppl (TRUE METRIX METER) DEVI 1 each by Does not apply route 3 (three) times daily before meals. 05/14/17  Yes Charlott Rakes, MD  cefdinir (OMNICEF) 300 MG capsule Take 1 capsule (300 mg total) by mouth every 12 (twelve) hours. 06/19/17  Yes Kathi Ludwig, MD  clopidogrel (PLAVIX) 75 MG tablet Take 1 tablet (75 mg total) by mouth daily. 06/19/17  Yes Kathi Ludwig, MD  gabapentin (NEURONTIN) 300 MG capsule Take 1 capsule (300 mg total) by mouth at bedtime. 05/14/17  Yes Charlott Rakes, MD  glucose blood (TRUE METRIX BLOOD GLUCOSE TEST) test strip Use 3 times daily before meals 05/14/17  Yes Newlin, Enobong, MD  glucose blood test strip Use as instructed 11/09/16  Yes Hosie Poisson, MD  ibuprofen (ADVIL,MOTRIN) 200 MG tablet Take 200 mg by mouth every 6 (six) hours as  needed for moderate pain.   Yes [provider]  isosorbide mononitrate (IMDUR) 60 MG 24 hr tablet Take 1 tablet (60 mg total) by mouth daily. 05/14/17  Yes Charlott Rakes, MD  lisinopril (PRINIVIL,ZESTRIL) 10 MG tablet Take 1 tablet (10 mg total) by mouth daily. 06/19/17 06/19/18 Yes Kathi Ludwig, MD  senna-docusate (SENOKOT-S) 8.6-50  MG tablet Take 1 tablet by mouth at bedtime as needed for mild constipation. 05/09/17  Yes Aline August, MD  TRUEPLUS LANCETS 28G MISC 1 each by Does not apply route 3 (three) times daily before meals. 05/14/17  Yes Charlott Rakes, MD    Objective:   Vitals:   07/05/17 1009  BP: (!) 144/77  Pulse: 60  Resp: 18  Temp: 98.2 F (36.8 C)  TempSrc: Oral  SpO2: 98%  Weight: 201 lb 9.6 oz (91.4 kg)  Height: '5\' 4"'$  (1.626 m)    Exam General appearance : Awake, alert, not in any distress. Speech Clear. Not toxic looking HEENT: Atraumatic and Normocephalic, pupils equally reactive to light and accomodation Neck: supple, no JVD. No cervical lymphadenopathy.  Chest:Good air entry bilaterally, no added sounds  CVS: S1 S2 regular, no murmurs.  Abdomen: Bowel sounds present, tender on right side and not distended with slight guarding, rigidity or rebound. Extremities: B/L Lower Ext shows no edema, both legs are warm to touch Neurology: Awake alert, and oriented X 3, CN II-XII intact, Non focal   Data Review Lab Results  Component Value Date   HGBA1C 12.7 (H) 05/08/2017   HGBA1C 13.0 (H) 11/08/2016   HGBA1C 13.1 (H) 11/07/2016     Assessment & Plan  1. Urosepsis 2/2 Pyelonephritis/E. Coli  -has had f/u with Urology  -outpt Urology procedure pending  -completed antibiotic course/afebrile 2. IDDM  -no change in regimen   -keep checking sugars 3-4 times per day and bring  log to appts 3. HTN  -cont Norvasc, Imdur, Ace  -low salt diet 4. Hx CVA  -remains on ASA and Plavix  -cont RF modification  HHPT as indicated Return in about 1 month (around 08/02/2017). has appt 1st week in June.  The patient was given clear instructions to go to ER or return to medical center if symptoms don't improve, worsen or new problems develop. The patient verbalized understanding. The patient was told to call to get lab results if they haven't heard anything in the next week.   Total time spent with  patient was 29 min. Greater than 50 % of this visit was spent face to face counseling and coordinating care regarding risk factor modification, compliance importance and encouragement, education related to hospitalization review.  This note has been created with Surveyor, quantity. Any transcriptional errors are unintentional.    Zettie Pho, PA-C [facility] [place], [place]   07/05/2017, 10:32 AM

## 2017-07-05 NOTE — Patient Instructions (Addendum)
Keep all appointments as scheduled No medicine changes today Aim for 30 minutes of exercise most days. Rethink what you drink. Water is great! Aim for 2-3 Carb Choices per meal (30-45 grams) +/- 1 either way  Aim for 0-15 Carbs per snack if hungry  Include protein in moderation with your meals and snacks  Consider reading food labels for Total Carbohydrate and Fat Grams of foods  Consider checking BG at alternate times per day  Continue taking medication as directed Be mindful about how much sugar you are adding to beverages and other foods. Fruit Punch - find one with no sugar  Measure and decrease portions of carbohydrate foods  Make your plate and don't go back for seconds

## 2017-07-11 ENCOUNTER — Ambulatory Visit (INDEPENDENT_AMBULATORY_CARE_PROVIDER_SITE_OTHER): Payer: Self-pay | Admitting: *Deleted

## 2017-07-11 DIAGNOSIS — I639 Cerebral infarction, unspecified: Secondary | ICD-10-CM

## 2017-07-12 ENCOUNTER — Telehealth: Payer: Self-pay

## 2017-07-12 ENCOUNTER — Other Ambulatory Visit: Payer: Self-pay | Admitting: Urology

## 2017-07-12 DIAGNOSIS — N200 Calculus of kidney: Secondary | ICD-10-CM

## 2017-07-12 NOTE — Telephone Encounter (Signed)
Paperwork has been received. 

## 2017-07-12 NOTE — Progress Notes (Signed)
Carelink Summary Report / Loop recorder 

## 2017-07-13 LAB — CUP PACEART REMOTE DEVICE CHECK
Implantable Pulse Generator Implant Date: 20190227
MDC IDC SESS DTM: 20190329214040

## 2017-07-16 ENCOUNTER — Emergency Department (HOSPITAL_COMMUNITY)
Admission: EM | Admit: 2017-07-16 | Discharge: 2017-07-17 | Disposition: A | Discharge status: Home / Self Care | Payer: Self-pay | Attending: Emergency Medicine | Admitting: Emergency Medicine

## 2017-07-16 ENCOUNTER — Encounter (HOSPITAL_COMMUNITY): Payer: Self-pay | Admitting: *Deleted

## 2017-07-16 ENCOUNTER — Emergency Department (HOSPITAL_COMMUNITY): Payer: Self-pay

## 2017-07-16 DIAGNOSIS — Z79899 Other long term (current) drug therapy: Secondary | ICD-10-CM | POA: Insufficient documentation

## 2017-07-16 DIAGNOSIS — Z7982 Long term (current) use of aspirin: Secondary | ICD-10-CM | POA: Insufficient documentation

## 2017-07-16 DIAGNOSIS — E119 Type 2 diabetes mellitus without complications: Secondary | ICD-10-CM | POA: Insufficient documentation

## 2017-07-16 DIAGNOSIS — M79604 Pain in right leg: Secondary | ICD-10-CM

## 2017-07-16 DIAGNOSIS — R1030 Lower abdominal pain, unspecified: Secondary | ICD-10-CM | POA: Insufficient documentation

## 2017-07-16 DIAGNOSIS — G8929 Other chronic pain: Secondary | ICD-10-CM

## 2017-07-16 DIAGNOSIS — I10 Essential (primary) hypertension: Secondary | ICD-10-CM | POA: Insufficient documentation

## 2017-07-16 DIAGNOSIS — M5441 Lumbago with sciatica, right side: Secondary | ICD-10-CM | POA: Insufficient documentation

## 2017-07-16 MED ORDER — OXYCODONE-ACETAMINOPHEN 5-325 MG PO TABS
1.0000 | ORAL_TABLET | Freq: Once | ORAL | Status: AC
  Administered 2017-07-16: 1 via ORAL
  Filled 2017-07-16: qty 1

## 2017-07-16 NOTE — ED Triage Notes (Signed)
To ED for eval of right leg pain (severe) since yesterday. Pt also complains of sob starting same time. Pt took OTC advil without relief. Pedal pulses palpable.

## 2017-07-16 NOTE — ED Provider Notes (Signed)
Patient placed in Quick Look pathway, seen and evaluated   Chief Complaint: leg pain  HPI:   Pt presents to ED with pain in left lower back radiating all the way down lateral leg to toes. Reports tingling in toes. NO injuries. No similar pain before. Tried advil with no relief. NO prior back problems. Reports some shortness of breath as well yesterday, none at this time. No chest pain. No swelling in leg. No recent travle or surgeris.   ROS: positive for leg pain, back pain, sob  Physical Exam:   Gen: No distress  Neuro: Awake and Alert  Skin: Warm    Focused Exam: ttp over mildline lumbar spine, right si joint. Pain with right straight leg raise. No calf tenderness. Negative homas sign. dp pulse intact.      Initiation of care has begun. The patient has been counseled on the process, plan, and necessity for staying for the completion/evaluation, and the remainder of the medical screening examination  Pt in ED with back pain radiating down right leg. Pulses present and normal distally. No leg discoloration or deformity. No calf pain or tenderness. Negative homans sign. Most likely pain radicular, doubt DVT. Will get xrays of chest and lumbar spine. Will give percocet for pain.   Vitals:   07/16/17 1857  BP: (!) 155/57  Pulse: 65  Resp: 16  Temp: 99.1 F (37.3 C)  TempSrc: Oral  SpO2: 98%      Jaynie Crumble, PA-C 07/16/17 1950    Eber Hong, MD 07/18/17 [phone]

## 2017-07-17 ENCOUNTER — Other Ambulatory Visit: Payer: Self-pay | Admitting: Internal Medicine

## 2017-07-17 ENCOUNTER — Emergency Department (HOSPITAL_COMMUNITY): Payer: Self-pay

## 2017-07-17 LAB — URINALYSIS, ROUTINE W REFLEX MICROSCOPIC
Bacteria, UA: NONE SEEN
Bilirubin Urine: NEGATIVE
Glucose, UA: NEGATIVE mg/dL
Hgb urine dipstick: NEGATIVE
Ketones, ur: NEGATIVE mg/dL
Nitrite: NEGATIVE
Protein, ur: 100 mg/dL — AB
Specific Gravity, Urine: 1.018 (ref 1.005–1.030)
pH: 6 (ref 5.0–8.0)

## 2017-07-17 LAB — CBC WITH DIFFERENTIAL/PLATELET
Basophils Absolute: 0 K/uL (ref 0.0–0.1)
Basophils Relative: 0 %
Eosinophils Absolute: 0.2 K/uL (ref 0.0–0.7)
Eosinophils Relative: 3 %
HCT: 38.9 % (ref 36.0–46.0)
Hemoglobin: 11.9 g/dL — ABNORMAL LOW (ref 12.0–15.0)
Lymphocytes Relative: 19 %
Lymphs Abs: 1.4 K/uL (ref 0.7–4.0)
MCH: 24.9 pg — ABNORMAL LOW (ref 26.0–34.0)
MCHC: 30.6 g/dL (ref 30.0–36.0)
MCV: 81.6 fL (ref 78.0–100.0)
Monocytes Absolute: 0.5 K/uL (ref 0.1–1.0)
Monocytes Relative: 7 %
Neutro Abs: 5.2 K/uL (ref 1.7–7.7)
Neutrophils Relative %: 71 %
Platelets: 224 K/uL (ref 150–400)
RBC: 4.77 MIL/uL (ref 3.87–5.11)
RDW: 16.7 % — ABNORMAL HIGH (ref 11.5–15.5)
WBC: 7.3 K/uL (ref 4.0–10.5)

## 2017-07-17 LAB — COMPREHENSIVE METABOLIC PANEL WITH GFR
ALT: 13 U/L — ABNORMAL LOW (ref 14–54)
AST: 19 U/L (ref 15–41)
Albumin: 3.4 g/dL — ABNORMAL LOW (ref 3.5–5.0)
Alkaline Phosphatase: 68 U/L (ref 38–126)
Anion gap: 8 (ref 5–15)
BUN: 23 mg/dL — ABNORMAL HIGH (ref 6–20)
CO2: 26 mmol/L (ref 22–32)
Calcium: 8.7 mg/dL — ABNORMAL LOW (ref 8.9–10.3)
Chloride: 105 mmol/L (ref 101–111)
Creatinine, Ser: 0.95 mg/dL (ref 0.44–1.00)
GFR calc Af Amer: >60 mL/min
GFR calc non Af Amer: 58 mL/min — ABNORMAL LOW
Glucose, Bld: 145 mg/dL — ABNORMAL HIGH (ref 65–99)
Potassium: 4 mmol/L (ref 3.5–5.1)
Sodium: 139 mmol/L (ref 135–145)
Total Bilirubin: 0.8 mg/dL (ref 0.3–1.2)
Total Protein: 6.4 g/dL — ABNORMAL LOW (ref 6.5–8.1)

## 2017-07-17 LAB — LIPASE, BLOOD: Lipase: 24 U/L (ref 11–51)

## 2017-07-17 MED ORDER — IOHEXOL 300 MG/ML  SOLN
100.0000 mL | Freq: Once | INTRAMUSCULAR | Status: AC | PRN
  Administered 2017-07-17: 100 mL via INTRAVENOUS

## 2017-07-17 MED FILL — AMLODIPINE BESYLATE 10 MG T: 10 | 30 days supply | Qty: 30 | Fill #3

## 2017-07-17 MED FILL — ISOSORBIDE MN ER 60 MG TAB: 60 | 30 days supply | Qty: 30 | Fill #1

## 2017-07-17 MED FILL — $LANTUS 100 UNITS/ML VIAL: 100 | 28 days supply | Qty: 20 | Fill #1

## 2017-07-17 MED FILL — TRUE METRIX TEST STRIP: 30 days supply | Qty: 100 | Fill #1

## 2017-07-17 NOTE — ED Notes (Signed)
Pt has ambulated with cane multiple times to bathroom. No difficulty noted.

## 2017-07-17 NOTE — ED Notes (Signed)
Pt ambulated to restroom with steady gait. Stand by assist for safety.  °

## 2017-07-17 NOTE — ED Provider Notes (Signed)
[facility] EMERGENCY DEPARTMENT Provider Note   CSN: [id number] Arrival date & time: 07/16/17  1842     History   Chief Complaint Chief Complaint  Patient presents with  . Shortness of Breath  . Leg Pain    HPI Meghan Ford is a 77 y.o. female with a past medical history of peripheral vascular disease, pyelonephritis, nephrolithiasis,T2DM, CVA on ASA/Plavix, who presents to the ED today with multiple complaints including right mid to lower abdominal pain, right lower back/hip pain that radiates down to her right leg onset 3 days ago. Per chart review she also reported shortness of breath to another provider although she denies this to me now. She denies any history of similar symptoms. She states she does have history of kidney stones and is scheduled to have "surgery" on this next week but this feels different to her. She has been taking ibuprofen and home muscle relaxers for the pain without any relief in her symptoms. She has been able to ambulate without difficulty and her pain does not worsen with ambulation. She denies any lower extremity swelling, recent travel, recent surgeries, history of malignancy. Denies any chest pain, dysuria.  HPI  Past Medical History:  Diagnosis Date  . Diabetes mellitus type II, uncontrolled ([place])   . Diabetes mellitus without complication ([place])   . Fx humeral neck   . Hyperlipidemia LDL goal < 100   . Hypertension   . Leg pain    worse with prolonged standing  . Peripheral vascular disease ([place])   . Pyelonephritis 06/14/2017  . Sepsis ([place]) 06/14/2017  . Stroke [facility]) 2018; 05/08/2017   denies residual from 2018 stroke on 05/08/2017; weakness on left; speech issues from today's stroke (05/08/2017)  . Varicose veins     Patient Active Problem List   Diagnosis Date Noted  . Hydronephrosis concurrent with and due to calculi of kidney and ureter   . Herpes labialis 06/18/2017  . Pyelonephritis   . Renal stone   .  Abdominal pain, right lateral 06/16/2017  . Anemia 06/16/2017  . Insulin dependent diabetes mellitus ([place]) 06/15/2017  . History of CVA (cerebrovascular accident) 05/07/2017  . Stroke ([place]) 05/07/2017  . Intracranial vascular stenosis   . TIA (transient ischemic attack) 11/07/2016  . Insulin dependent type 2 diabetes mellitus, uncontrolled ([place]) 11/07/2016  . HLD (hyperlipidemia) 11/07/2016  . HTN (hypertension) 11/07/2016  . 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 Noncompliance with diet and medication regimen 08/26/2015  . S/p reverse total shoulder arthroplasty   . Leukocytosis   . Proximal humerus fracture 10/26/2014  . Hyperlipidemia 10/23/2014  . Hypokalemia 10/09/2014  . Essential hypertension 10/09/2014  . Protein-calorie malnutrition, severe ([place]) 09/29/2014  . Severe sepsis ([place]) 09/28/2014  . Fever 09/28/2014  . Headache 09/28/2014  . Diabetes mellitus with neuropathy ([place])   . Pyrexia   . Uncontrolled hypertension 10/15/2012  . Neuropathic pain of both legs 10/15/2012  . Palpitations 05/21/2012  . Dyslipidemia 05/15/2012  . Hypertension   . Hyperlipidemia LDL goal < 100   . Diabetes mellitus type II, uncontrolled ([place])   . Varicose veins of lower extremities with other complications 36/14/4315    Past Surgical History:  Procedure Laterality Date  . FRACTURE SURGERY    . LOOP RECORDER INSERTION N/A 05/09/2017   Procedure: LOOP RECORDER INSERTION;  Surgeon: Constance Haw, MD;  Location: [facility] CV LAB;  Service: Cardiovascular;  Laterality: N/A;  .  REVERSE SHOULDER ARTHROPLASTY Left 10/27/2014   Procedure: REVERSE SHOULDER ARTHROPLASTY;  Surgeon: Renette Butters, MD;  Location: [facility];  Service: Orthopedics;  Laterality: Left;  . TEE WITHOUT CARDIOVERSION N/A 05/09/2017   Procedure: TRANSESOPHAGEAL ECHOCARDIOGRAM (TEE);  Surgeon: Jerline Pain, MD;  Location: [facility] ENDOSCOPY;  Service: Cardiovascular;   Laterality: N/A;  . TOTAL SHOULDER ARTHROPLASTY Left 10/27/2014   Procedure: TOTAL SHOULDER ARTHROPLASTY;  Surgeon: Renette Butters, MD;  Location: [facility];  Service: Orthopedics;  Laterality: Left;  Marland Kitchen VEIN SURGERY Left    left leg     OB History    Gravida  0   Para  0   Term  0   Preterm  0   AB  0   Living        SAB  0   TAB  0   Ectopic  0   Multiple      Live Births               Home Medications    Prior to Admission medications   Medication Sig Start Date End Date Taking? Authorizing Provider  amLODipine (NORVASC) 10 MG tablet Take 1 tablet (10 mg total) by mouth daily. 05/14/17   Charlott Rakes, MD  aspirin 81 MG tablet Take 4 tablets (325 mg total) by mouth daily. 06/26/17   Colbert Ewing, MD  atorvastatin (LIPITOR) 80 MG tablet Take 1 tablet (80 mg total) by mouth daily. 05/14/17   Charlott Rakes, MD  blood glucose meter kit and supplies KIT Dispense based on patient and insurance preference. Use up to four times daily as directed. (FOR ICD-9 250.00, 250.01). 11/09/16   Hosie Poisson, MD  Blood Glucose Monitoring Suppl (TRUE METRIX METER) DEVI 1 each by Does not apply route 3 (three) times daily before meals. 05/14/17   Charlott Rakes, MD  cefdinir (OMNICEF) 300 MG capsule Take 1 capsule (300 mg total) by mouth every 12 (twelve) hours. 06/19/17   Kathi Ludwig, MD  clopidogrel (PLAVIX) 75 MG tablet Take 1 tablet (75 mg total) by mouth daily. 06/19/17   Kathi Ludwig, MD  gabapentin (NEURONTIN) 300 MG capsule Take 1 capsule (300 mg total) by mouth at bedtime. 05/14/17   Charlott Rakes, MD  glucose blood (TRUE METRIX BLOOD GLUCOSE TEST) test strip Use 3 times daily before meals 05/14/17   Charlott Rakes, MD  glucose blood test strip Use as instructed 11/09/16   Hosie Poisson, MD  ibuprofen (ADVIL,MOTRIN) 200 MG tablet Take 200 mg by mouth every 6 (six) hours as needed for moderate pain.    [provider]  isosorbide mononitrate (IMDUR) 60 MG 24  hr tablet Take 1 tablet (60 mg total) by mouth daily. 05/14/17   Charlott Rakes, MD  lisinopril (PRINIVIL,ZESTRIL) 10 MG tablet Take 1 tablet (10 mg total) by mouth daily. 06/19/17 06/19/18  Kathi Ludwig, MD  senna-docusate (SENOKOT-S) 8.6-50 MG tablet Take 1 tablet by mouth at bedtime as needed for mild constipation. 05/09/17   Aline August, MD  TRUEPLUS LANCETS 28G MISC 1 each by Does not apply route 3 (three) times daily before meals. 05/14/17   Charlott Rakes, MD    Family History Family History  Problem Relation Age of Onset  . Cancer Brother   . Heart disease Sister     Social History Social History   Tobacco Use  . Smoking status: Never Smoker  . Smokeless tobacco: Never Used  Substance Use Topics  . Alcohol use: No  . Drug  use: No     Allergies   Patient has no known allergies.   Review of Systems Review of Systems  All other systems reviewed and are negative.    Physical Exam Updated Vital Signs BP (!) 182/65   Pulse (!) 59   Temp 97.7 F (36.5 C) (Oral)   Resp 20   SpO2 97%   Physical Exam  Constitutional: She is oriented to person, place, and time. She appears well-developed. She does not appear ill. No distress.  HENT:  Head: Normocephalic and atraumatic.  Mouth/Throat: No oropharyngeal exudate.  Eyes: Pupils are equal, round, and reactive to light. Conjunctivae and EOM are normal. Right eye exhibits no discharge. Left eye exhibits no discharge. No scleral icterus.  Cardiovascular: Normal rate, regular rhythm, normal heart sounds and intact distal pulses. Exam reveals no gallop and no friction rub.  No murmur heard. Pulmonary/Chest: Effort normal and breath sounds normal. No respiratory distress. She has no wheezes. She has no rales. She exhibits no tenderness.  Abdominal: Soft. She exhibits no distension. There is no guarding.  Obese Mild RLQ/R mid abdominal TTP without rebound or guarding  Musculoskeletal: Normal range of motion. She exhibits no  edema.  TTP over lumbar spine and R hip. NO increased pain with SLR. No obvious bony deformities.  Neurological: She is alert and oriented to person, place, and time.  Strength 5/5 throughout. No sensory deficits. No gait abnormality.  No slurred speech. No facial droop.    Skin: Skin is warm and dry. No rash noted. She is not diaphoretic. No erythema. No pallor.  Chronic skin changes in b/l LE  Psychiatric: She has a normal mood and affect. Her behavior is normal.  Nursing note and vitals reviewed.    ED Treatments / Results  Labs (all labs ordered are listed, but only abnormal results are displayed) Labs Reviewed  CBC WITH DIFFERENTIAL/PLATELET  COMPREHENSIVE METABOLIC PANEL  LIPASE, BLOOD  URINALYSIS, ROUTINE W REFLEX MICROSCOPIC    EKG EKG Interpretation  Date/Time:  Monday Jul 16 2017 18:50:02 EDT Ventricular Rate:  69 PR Interval:  154 QRS Duration: 80 QT Interval:  418 QTC Calculation: 447 R Axis:   6 Text Interpretation:  Normal sinus rhythm Inferior infarct , age undetermined Anterior infarct , age undetermined Confirmed by Dory Horn) on 07/17/2017 6:02:10 AM   Radiology Dg Chest 2 View  Result Date: 07/16/2017 CLINICAL DATA:  One day history of shortness of breath. Current history of diabetes and hypertension. EXAM: CHEST - 2 VIEW COMPARISON:  06/14/2017, 10/27/2014 and earlier. FINDINGS: AP supine and lateral images were obtained. Cardiac silhouette mildly to moderately enlarged for AP supine technique, unchanged. Thoracic aorta atherosclerotic, unchanged. Hilar and mediastinal contours otherwise unremarkable. Lungs clear. Bronchovascular markings normal. Pulmonary vascularity normal. No visible pleural effusions. No pneumothorax. Implantable cardiac recording device overlies the LEFT chest. Prior LEFT shoulder arthroplasty with anatomic alignment. Degenerative changes and DISH involving the thoracic spine. IMPRESSION: 1. Stable cardiomegaly.  No acute  cardiopulmonary disease. 2.  Aortic Atherosclerosis (ICD10-170.0) Electronically Signed   By: Evangeline Dakin M.D.   On: 07/16/2017 21:27   Dg Lumbar Spine Complete  Result Date: 07/16/2017 CLINICAL DATA:  Right-sided lower back pain EXAM: LUMBAR SPINE - COMPLETE 4+ VIEW COMPARISON:  06/14/2017 CT abdomen and pelvis and same day CXR FINDINGS: Lumbarized S1 for numbering purposes. No acute lumbar spine fracture. Lower lumbar facet arthropathy from L4 through S2. Degenerative grade 1 anterolisthesis of L5 on S1 with associated vacuum disc phenomena.  No aggressive osseous lesions. IMPRESSION: 1. Lumbarized S1 for numbering purposes. The lowest square vertebral body on the lateral view will be labeled S1. Attention to numbering recommended prior to any surgical intervention. 2. Vacuum disc phenomena at L5-S1 with grade 1 anterolisthesis, likely facet and degenerative disc mediated. No pars defects. 3. No acute lumbar spine fracture. Electronically Signed   By: Ashley Royalty M.D.   On: 07/16/2017 21:30    Procedures Procedures (including critical care time)  Medications Ordered in ED Medications  oxyCODONE-acetaminophen (PERCOCET/ROXICET) 5-325 MG per tablet 1 tablet (1 tablet Oral Given 07/16/17 2018)     Initial Impression / Assessment and Plan / ED Course  I have reviewed the triage vital signs and the nursing notes.  Pertinent labs & imaging results that were available during my care of the patient were reviewed by me and considered in my medical decision making (see chart for details).     77 y.o F with pmhx of DM, PVD on ASA/Plavix who presented to the ED with multiple complaints including lower back pain radiating intto R leg, R abdominal pain. All symptoms began about 3 days ago. Pt difficult historian and primarily [place] speaking. On exam she has no focal neuro deficits. No unliateral leg swelling, recent travel. Pt is low risk well's criteria, doubt DVT. Lumbar xray showed vacuum disc  phenomena at L5-S1 with grade 1 anterolisthesis, likely facet and degenerative disc mediated. These findings could easily explain all of pt R lower back and leg pain although given her age and co-morbidities will obtain Ct A/P to r/o possible AAA vs SBO vs appendicitis vs pyelo. Troponin and CXR ordered as she reprotedly complained of SOB to another provider although she denies this to me now. EKG without evidence of ischemia.  Labs largely unremarkable. No leukocytosis. UA does not appear to be infected.  CT A/P showed no acute intra-abdominal pathology. 50m non-obstrutive calculus in left renal pelvis. There is colonic diverticulosis. Possible early cirrhosis. Atherosclerosis noted.  Pt ambulated in the ED without difficulty. She has close follow up with PCP as well as urology for nephrolithiasis. She can take home tylenol and muscle relaxers for back pain. Return precautions outlined in patient discharge instructions.    Final Clinical Impressions(s) / ED Diagnoses   Final diagnoses:  Right leg pain  Chronic right-sided low back pain with right-sided sciatica    ED Discharge Orders    None       DCarlos Levering PA-C 07/17/17 1057    Palumbo, April, MD 07/19/17 1501

## 2017-07-17 NOTE — Discharge Instructions (Addendum)
Take home muscle relaxers and tylenol as needed for pain. Ambulate as much as possible. Follow up closely with your primary care provider. Return to the ED if you experience any severe worsening of your symptoms, loss of consciousness, chest pain, difficulty breathing, pain with urination, fevers

## 2017-07-20 ENCOUNTER — Ambulatory Visit: Payer: Self-pay | Admitting: Family Medicine

## 2017-07-24 ENCOUNTER — Other Ambulatory Visit: Payer: Self-pay | Admitting: Cardiology

## 2017-07-26 ENCOUNTER — Other Ambulatory Visit: Payer: Self-pay

## 2017-07-26 MED ORDER — INSULIN SYRINGES (DISPOSABLE) U-100 0.3 ML MISC: 11 refills | Status: DC

## 2017-08-06 LAB — CUP PACEART REMOTE DEVICE CHECK
Date Time Interrogation Session: 20190501220957
MDC IDC PG IMPLANT DT: 20190227

## 2017-08-07 NOTE — Progress Notes (Signed)
07-17-17 (Epic) EKG  07-16-17 (Epic) CXR  05-09-17 (Epic) ECHO

## 2017-08-07 NOTE — Patient Instructions (Signed)
Meghan Ford  08/07/2017   Your procedure is scheduled on: 08-13-17   Report to [facility] Main  Entrance    Report to Admitting at 10:30 AM    Call this number if you have problems the morning of surgery [phone]   Remember: Do not eat food or drink liquids :After Midnight.     Take these medicines the morning of surgery with A SIP OF WATER: Amlodipine (Norvasc), Atorvastatin, and Isosorbide Mononitrate (Imdur)                                You may not have any metal on your body including hair pins and              piercings  Do not wear jewelry, make-up, lotions, powders or perfumes, deodorant             Do not wear nail polish.  Do not shave  48 hours prior to surgery.                 Do not bring valuables to the hospital. [facility] IS NOT             RESPONSIBLE   FOR VALUABLES.  Contacts, dentures or bridgework may not be worn into surgery.  Leave suitcase in the car. After surgery it may be brought to your room.     Patients discharged the day of surgery will not be allowed to drive home.  Name and phone number of your driver:  Special Instructions: N/A              Please read over the following fact sheets you were given: _____________________________________________________________________             [facility] - Preparing for Surgery Before surgery, you can play an important role.  Because skin is not sterile, your skin needs to be as free of germs as possible.  You can reduce the number of germs on your skin by washing with CHG (chlorahexidine gluconate) soap before surgery.  CHG is an antiseptic cleaner which kills germs and bonds with the skin to continue killing germs even after washing. Please DO NOT use if you have an allergy to CHG or antibacterial soaps.  If your skin becomes reddened/irritated stop using the CHG and inform your nurse when you arrive at Short Stay. Do not shave (including legs and underarms) for  at least 48 hours prior to the first CHG shower.  You may shave your face/neck. Please follow these instructions carefully:  1.  Shower with CHG Soap the night before surgery and the  morning of Surgery.  2.  If you choose to wash your hair, wash your hair first as usual with your  normal  shampoo.  3.  After you shampoo, rinse your hair and body thoroughly to remove the  shampoo.                           4.  Use CHG as you would any other liquid soap.  You can apply chg directly  to the skin and wash                       Gently with a scrungie or clean washcloth.  5.  Apply  the CHG Soap to your body ONLY FROM THE NECK DOWN.   Do not use on face/ open                           Wound or open sores. Avoid contact with eyes, ears mouth and genitals (private parts).                       Wash face,  Genitals (private parts) with your normal soap.             6.  Wash thoroughly, paying special attention to the area where your surgery  will be performed.  7.  Thoroughly rinse your body with warm water from the neck down.  8.  DO NOT shower/wash with your normal soap after using and rinsing off  the CHG Soap.                9.  Pat yourself dry with a clean towel.            10.  Wear clean pajamas.            11.  Place clean sheets on your bed the night of your first shower and do not  sleep with pets. Day of Surgery : Do not apply any lotions/deodorants the morning of surgery.  Please wear clean clothes to the hospital/surgery center.  FAILURE TO FOLLOW THESE INSTRUCTIONS MAY RESULT IN THE CANCELLATION OF YOUR SURGERY PATIENT SIGNATURE_________________________________  NURSE SIGNATURE__________________________________  ________________________________________________________________________  WHAT IS A BLOOD TRANSFUSION? Blood Transfusion Information  A transfusion is the replacement of blood or some of its parts. Blood is made up of multiple cells which provide different functions.  Red  blood cells carry oxygen and are used for blood loss replacement.  White blood cells fight against infection.  Platelets control bleeding.  Plasma helps clot blood.  Other blood products are available for specialized needs, such as hemophilia or other clotting disorders. BEFORE THE TRANSFUSION  Who gives blood for transfusions?   Healthy volunteers who are fully evaluated to make sure their blood is safe. This is blood bank blood. Transfusion therapy is the safest it has ever been in the practice of medicine. Before blood is taken from a donor, a complete history is taken to make sure that person has no history of diseases nor engages in risky social behavior (examples are intravenous drug use or sexual activity with multiple partners). The donor's travel history is screened to minimize risk of transmitting infections, such as malaria. The donated blood is tested for signs of infectious diseases, such as HIV and hepatitis. The blood is then tested to be sure it is compatible with you in order to minimize the chance of a transfusion reaction. If you or a relative donates blood, this is often done in anticipation of surgery and is not appropriate for emergency situations. It takes many days to process the donated blood. RISKS AND COMPLICATIONS Although transfusion therapy is very safe and saves many lives, the main dangers of transfusion include:   Getting an infectious disease.  Developing a transfusion reaction. This is an allergic reaction to something in the blood you were given. Every precaution is taken to prevent this. The decision to have a blood transfusion has been considered carefully by your caregiver before blood is given. Blood is not given unless the benefits outweigh the risks. AFTER THE TRANSFUSION  Right after receiving a blood  transfusion, you will usually feel much better and more energetic. This is especially true if your red blood cells have gotten low (anemic). The  transfusion raises the level of the red blood cells which carry oxygen, and this usually causes an energy increase.  The nurse administering the transfusion will monitor you carefully for complications. HOME CARE INSTRUCTIONS  No special instructions are needed after a transfusion. You may find your energy is better. Speak with your caregiver about any limitations on activity for underlying diseases you may have. SEEK MEDICAL CARE IF:   Your condition is not improving after your transfusion.  You develop redness or irritation at the intravenous (IV) site. SEEK IMMEDIATE MEDICAL CARE IF:  Any of the following symptoms occur over the next 12 hours:  Shaking chills.  You have a temperature by mouth above 102 F (38.9 C), not controlled by medicine.  Chest, back, or muscle pain.  People around you feel you are not acting correctly or are confused.  Shortness of breath or difficulty breathing.  Dizziness and fainting.  You get a rash or develop hives.  You have a decrease in urine output.  Your urine turns a dark color or changes to pink, red, or brown. Any of the following symptoms occur over the next 10 days:  You have a temperature by mouth above 102 F (38.9 C), not controlled by medicine.  Shortness of breath.  Weakness after normal activity.  The white part of the eye turns yellow (jaundice).  You have a decrease in the amount of urine or are urinating less often.  Your urine turns a dark color or changes to pink, red, or brown. Document Released: 02/25/2000 Document Revised: 05/22/2011 Document Reviewed: 10/14/2007 [facility] Patient Information 2014 [place], [place].  _______________________________________________________________________

## 2017-08-08 ENCOUNTER — Inpatient Hospital Stay (HOSPITAL_COMMUNITY)
Admission: RE | Admit: 2017-08-08 | Discharge: 2017-08-08 | Disposition: A | Discharge status: Home / Self Care | Payer: Self-pay | Source: Ambulatory Visit

## 2017-08-08 MED FILL — GABAPENTIN 300 MG CAPSULE: 300 | 30 days supply | Qty: 30 | Fill #2

## 2017-08-08 MED FILL — ATORVASTATIN 80 MG TABLET: 80 | 30 days supply | Qty: 30 | Fill #2

## 2017-08-08 MED FILL — CLOPIDOGREL 75 MG TABLET: 75 | 30 days supply | Qty: 30 | Fill #1

## 2017-08-09 ENCOUNTER — Encounter (HOSPITAL_COMMUNITY): Payer: Self-pay

## 2017-08-09 ENCOUNTER — Other Ambulatory Visit: Payer: Self-pay

## 2017-08-09 ENCOUNTER — Telehealth: Payer: Self-pay

## 2017-08-09 ENCOUNTER — Encounter (HOSPITAL_COMMUNITY)
Admission: RE | Admit: 2017-08-09 | Discharge: 2017-08-09 | Disposition: A | Discharge status: Home / Self Care | Payer: Self-pay | Source: Ambulatory Visit | Attending: Urology | Admitting: Urology

## 2017-08-09 ENCOUNTER — Ambulatory Visit: Payer: MEDICAID | Admitting: Adult Health

## 2017-08-09 DIAGNOSIS — Z01812 Encounter for preprocedural laboratory examination: Secondary | ICD-10-CM | POA: Insufficient documentation

## 2017-08-09 HISTORY — DX: Personal history of urinary calculi: Z87.442

## 2017-08-09 HISTORY — DX: Unspecified abnormalities of gait and mobility: R26.9

## 2017-08-09 HISTORY — DX: Unspecified osteoarthritis, unspecified site: M19.90

## 2017-08-09 HISTORY — DX: Presence of other cardiac implants and grafts: Z95.818

## 2017-08-09 HISTORY — DX: Other symptoms and signs involving the musculoskeletal system: R29.898

## 2017-08-09 HISTORY — DX: Other visual disturbances: H53.8

## 2017-08-09 LAB — BASIC METABOLIC PANEL
ANION GAP: 10 (ref 5–15)
BUN: 25 mg/dL — ABNORMAL HIGH (ref 6–20)
CHLORIDE: 106 mmol/L (ref 101–111)
CO2: 25 mmol/L (ref 22–32)
CREATININE: 0.82 mg/dL (ref 0.44–1.00)
Calcium: 9.5 mg/dL (ref 8.9–10.3)
GFR calc non Af Amer: >60 mL/min (ref >60)
Glucose, Bld: 131 mg/dL — ABNORMAL HIGH (ref 65–99)
POTASSIUM: 4.5 mmol/L (ref 3.5–5.1)
SODIUM: 141 mmol/L (ref 135–145)

## 2017-08-09 LAB — GLUCOSE, CAPILLARY: Glucose-Capillary: 126 mg/dL — ABNORMAL HIGH (ref 65–99)

## 2017-08-09 LAB — HEMOGLOBIN A1C
Hgb A1c MFr Bld: 6.8 % — ABNORMAL HIGH (ref 4.8–5.6)
Mean Plasma Glucose: 148.46 mg/dL

## 2017-08-09 LAB — ABO/RH: ABO/RH(D): O POS

## 2017-08-09 LAB — CBC
HCT: 42.1 % (ref 36.0–46.0)
HEMOGLOBIN: 13.2 g/dL (ref 12.0–15.0)
MCH: 25.7 pg — ABNORMAL LOW (ref 26.0–34.0)
MCHC: 31.4 g/dL (ref 30.0–36.0)
MCV: 82.1 fL (ref 78.0–100.0)
PLATELETS: 279 10*3/uL (ref 150–400)
RBC: 5.13 MIL/uL — AB (ref 3.87–5.11)
RDW: 16.1 % — ABNORMAL HIGH (ref 11.5–15.5)
WBC: 9.6 10*3/uL (ref 4.0–10.5)

## 2017-08-09 MED ORDER — INSULIN GLARGINE 100 UNIT/ML ~~LOC~~ SOLN: 35.0000 [IU] | Freq: Two times a day (BID) | SUBCUTANEOUS | 11 refills | Status: DC

## 2017-08-09 MED FILL — $LANTUS 100 UNITS/ML VIAL: 100 | 28 days supply | Qty: 20 | Fill #0

## 2017-08-09 NOTE — Pre-Procedure Instructions (Signed)
Meghan Ford, spanish interpreter present at pre op appointment today.

## 2017-08-09 NOTE — Pre-Procedure Instructions (Signed)
CBC and BMP 08/09/2017 faxed to Dr. Ronne Binning via epic.

## 2017-08-09 NOTE — Pre-Procedure Instructions (Signed)
Left chart with [facility] to check Hgb A1C lab results from 08/09/2017

## 2017-08-09 NOTE — Patient Instructions (Addendum)
Your procedure is scheduled on: Monday, August 13, 2017   Surgery Time:  12:50PM-3:00PM   Report to [facility] Main  Entrance    Report to admitting at 7:30 AM   Call this number if you have problems the morning of surgery [phone]   Do not eat food or drink liquids :After Midnight.   Do NOT smoke after Midnight   Take these medicines the morning of surgery with A SIP OF WATER: Amlodipine, Atorvastatin, Isosorbide    Take half (1/2) Lantus evening dose the night before surgery   DO NOT TAKE ANY DIABETIC MEDICATIONS DAY OF YOUR SURGERY                               You may not have any metal on your body including hair pins, jewelry, and body piercings             Do not wear make-up, lotions, powders, perfumes/cologne, or deodorant             Do not wear nail polish.  Do not shave  48 hours prior to surgery.               Do not bring valuables to the hospital. [facility] IS NOT             RESPONSIBLE   FOR VALUABLES.   Contacts, dentures or bridgework may not be worn into surgery.   Leave suitcase in the car. After surgery it may be brought to your room.   Special Instructions: Bring a copy of your healthcare power of attorney and living will documents         the day of surgery if you haven't scanned them in before.              Please read over the following fact sheets you were given:   [facility] - Preparing for Surgery Before surgery, you can play an important role.  Because skin is not sterile, your skin needs to be as free of germs as possible.  You can reduce the number of germs on your skin by washing with CHG (chlorahexidine gluconate) soap before surgery.  CHG is an antiseptic cleaner which kills germs and bonds with the skin to continue killing germs even after washing. Please DO NOT use if you have an allergy to CHG or antibacterial soaps.  If your skin becomes reddened/irritated stop using the CHG and inform your nurse when you arrive at Short  Stay. Do not shave (including legs and underarms) for at least 48 hours prior to the first CHG shower.  You may shave your face/neck.  Please follow these instructions carefully:  1.  Shower with CHG Soap the night before surgery and the  morning of surgery.  2.  If you choose to wash your hair, wash your hair first as usual with your normal  shampoo.  3.  After you shampoo, rinse your hair and body thoroughly to remove the shampoo.                             4.  Use CHG as you would any other liquid soap.  You can apply chg directly to the skin and wash.  Gently with a scrungie or clean washcloth.  5.  Apply the CHG Soap to your body ONLY FROM THE NECK DOWN.  Do   not use on face/ open                           Wound or open sores. Avoid contact with eyes, ears mouth and   genitals (private parts).                       Wash face,  Genitals (private parts) with your normal soap.             6.  Wash thoroughly, paying special attention to the area where your    surgery  will be performed.  7.  Thoroughly rinse your body with warm water from the neck down.  8.  DO NOT shower/wash with your normal soap after using and rinsing off the CHG Soap.                9.  Pat yourself dry with a clean towel.            10.  Wear clean pajamas.            11.  Place clean sheets on your bed the night of your first shower and do not  sleep with pets. Day of Surgery : Do not apply any lotions/deodorants the morning of surgery.  Please wear clean clothes to the hospital/surgery center.  FAILURE TO FOLLOW THESE INSTRUCTIONS MAY RESULT IN THE CANCELLATION OF YOUR SURGERY  PATIENT SIGNATURE_________________________________  NURSE SIGNATURE__________________________________  ________________________________________________________________________  WHAT IS A BLOOD TRANSFUSION? Blood Transfusion Information  A transfusion is the replacement of blood or some of its parts. Blood is made up of multiple cells  which provide different functions.  Red blood cells carry oxygen and are used for blood loss replacement.  White blood cells fight against infection.  Platelets control bleeding.  Plasma helps clot blood.  Other blood products are available for specialized needs, such as hemophilia or other clotting disorders. BEFORE THE TRANSFUSION  Who gives blood for transfusions?   Healthy volunteers who are fully evaluated to make sure their blood is safe. This is blood bank blood. Transfusion therapy is the safest it has ever been in the practice of medicine. Before blood is taken from a donor, a complete history is taken to make sure that person has no history of diseases nor engages in risky social behavior (examples are intravenous drug use or sexual activity with multiple partners). The donor's travel history is screened to minimize risk of transmitting infections, such as malaria. The donated blood is tested for signs of infectious diseases, such as HIV and hepatitis. The blood is then tested to be sure it is compatible with you in order to minimize the chance of a transfusion reaction. If you or a relative donates blood, this is often done in anticipation of surgery and is not appropriate for emergency situations. It takes many days to process the donated blood. RISKS AND COMPLICATIONS Although transfusion therapy is very safe and saves many lives, the main dangers of transfusion include:   Getting an infectious disease.  Developing a transfusion reaction. This is an allergic reaction to something in the blood you were given. Every precaution is taken to prevent this. The decision to have a blood transfusion has been considered carefully by your caregiver before blood is given. Blood is not given unless the benefits outweigh the risks. AFTER THE TRANSFUSION  Right after receiving a blood transfusion, you will usually feel much  better and more energetic. This is especially true if your red blood  cells have gotten low (anemic). The transfusion raises the level of the red blood cells which carry oxygen, and this usually causes an energy increase.  The nurse administering the transfusion will monitor you carefully for complications. HOME CARE INSTRUCTIONS  No special instructions are needed after a transfusion. You may find your energy is better. Speak with your caregiver about any limitations on activity for underlying diseases you may have. SEEK MEDICAL CARE IF:   Your condition is not improving after your transfusion.  You develop redness or irritation at the intravenous (IV) site. SEEK IMMEDIATE MEDICAL CARE IF:  Any of the following symptoms occur over the next 12 hours:  Shaking chills.  You have a temperature by mouth above 102 F (38.9 C), not controlled by medicine.  Chest, back, or muscle pain.  People around you feel you are not acting correctly or are confused.  Shortness of breath or difficulty breathing.  Dizziness and fainting.  You get a rash or develop hives.  You have a decrease in urine output.  Your urine turns a dark color or changes to pink, red, or brown. Any of the following symptoms occur over the next 10 days:  You have a temperature by mouth above 102 F (38.9 C), not controlled by medicine.  Shortness of breath.  Weakness after normal activity.  The white part of the eye turns yellow (jaundice).  You have a decrease in the amount of urine or are urinating less often.  Your urine turns a dark color or changes to pink, red, or brown. Document Released: 02/25/2000 Document Revised: 05/22/2011 Document Reviewed: 10/14/2007 [facility] Patient Information 2014 [place], [place].  _______________________________________________________________________

## 2017-08-09 NOTE — Telephone Encounter (Signed)
Patient no show for appointment today.

## 2017-08-10 ENCOUNTER — Telehealth: Payer: Self-pay | Admitting: Cardiology

## 2017-08-10 ENCOUNTER — Other Ambulatory Visit: Payer: Self-pay | Admitting: Urology

## 2017-08-10 ENCOUNTER — Encounter: Payer: Self-pay | Admitting: Adult Health

## 2017-08-10 NOTE — Telephone Encounter (Signed)
LMOVM requesting that pt send manual transmission b/c home monitor has not updated in at least 14 days.    

## 2017-08-13 ENCOUNTER — Inpatient Hospital Stay (HOSPITAL_COMMUNITY): Admission: RE | Admit: 2017-08-13 | Payer: Self-pay | Source: Ambulatory Visit

## 2017-08-13 ENCOUNTER — Ambulatory Visit (INDEPENDENT_AMBULATORY_CARE_PROVIDER_SITE_OTHER): Payer: Self-pay | Admitting: *Deleted

## 2017-08-13 ENCOUNTER — Ambulatory Visit (HOSPITAL_COMMUNITY): Payer: Self-pay

## 2017-08-13 DIAGNOSIS — I639 Cerebral infarction, unspecified: Secondary | ICD-10-CM

## 2017-08-13 LAB — TYPE AND SCREEN
ABO/RH(D): O POS
ANTIBODY SCREEN: NEGATIVE

## 2017-08-14 ENCOUNTER — Ambulatory Visit: Payer: Self-pay | Admitting: Family Medicine

## 2017-08-14 ENCOUNTER — Encounter: Payer: Self-pay | Admitting: Cardiology

## 2017-08-14 ENCOUNTER — Ambulatory Visit: Payer: Self-pay | Attending: Family Medicine | Admitting: Family Medicine

## 2017-08-14 ENCOUNTER — Encounter: Payer: Self-pay | Admitting: Family Medicine

## 2017-08-14 VITALS — BP 148/66 | HR 72 | Temp 98.2°F | Ht 64.0 in | Wt 200.2 lb

## 2017-08-14 DIAGNOSIS — Z794 Long term (current) use of insulin: Secondary | ICD-10-CM | POA: Insufficient documentation

## 2017-08-14 DIAGNOSIS — Z96619 Presence of unspecified artificial shoulder joint: Secondary | ICD-10-CM | POA: Insufficient documentation

## 2017-08-14 DIAGNOSIS — Z9889 Other specified postprocedural states: Secondary | ICD-10-CM | POA: Insufficient documentation

## 2017-08-14 DIAGNOSIS — Z8673 Personal history of transient ischemic attack (TIA), and cerebral infarction without residual deficits: Secondary | ICD-10-CM | POA: Insufficient documentation

## 2017-08-14 DIAGNOSIS — E1151 Type 2 diabetes mellitus with diabetic peripheral angiopathy without gangrene: Secondary | ICD-10-CM | POA: Insufficient documentation

## 2017-08-14 DIAGNOSIS — Z9119 Patient's noncompliance with other medical treatment and regimen: Secondary | ICD-10-CM | POA: Insufficient documentation

## 2017-08-14 DIAGNOSIS — E1140 Type 2 diabetes mellitus with diabetic neuropathy, unspecified: Secondary | ICD-10-CM | POA: Insufficient documentation

## 2017-08-14 DIAGNOSIS — Z87442 Personal history of urinary calculi: Secondary | ICD-10-CM | POA: Insufficient documentation

## 2017-08-14 DIAGNOSIS — Z79899 Other long term (current) drug therapy: Secondary | ICD-10-CM | POA: Insufficient documentation

## 2017-08-14 DIAGNOSIS — N132 Hydronephrosis with renal and ureteral calculous obstruction: Secondary | ICD-10-CM | POA: Insufficient documentation

## 2017-08-14 DIAGNOSIS — Z7982 Long term (current) use of aspirin: Secondary | ICD-10-CM | POA: Insufficient documentation

## 2017-08-14 DIAGNOSIS — E785 Hyperlipidemia, unspecified: Secondary | ICD-10-CM | POA: Insufficient documentation

## 2017-08-14 DIAGNOSIS — Z7902 Long term (current) use of antithrombotics/antiplatelets: Secondary | ICD-10-CM | POA: Insufficient documentation

## 2017-08-14 DIAGNOSIS — M1710 Unilateral primary osteoarthritis, unspecified knee: Secondary | ICD-10-CM | POA: Insufficient documentation

## 2017-08-14 DIAGNOSIS — I10 Essential (primary) hypertension: Secondary | ICD-10-CM | POA: Insufficient documentation

## 2017-08-14 DIAGNOSIS — E1169 Type 2 diabetes mellitus with other specified complication: Secondary | ICD-10-CM | POA: Insufficient documentation

## 2017-08-14 DIAGNOSIS — I6340 Cerebral infarction due to embolism of unspecified cerebral artery: Secondary | ICD-10-CM

## 2017-08-14 DIAGNOSIS — E1165 Type 2 diabetes mellitus with hyperglycemia: Secondary | ICD-10-CM

## 2017-08-14 DIAGNOSIS — I97821 Postprocedural cerebrovascular infarction during other surgery: Secondary | ICD-10-CM | POA: Insufficient documentation

## 2017-08-14 DIAGNOSIS — Z95818 Presence of other cardiac implants and grafts: Secondary | ICD-10-CM | POA: Insufficient documentation

## 2017-08-14 LAB — GLUCOSE, POCT (MANUAL RESULT ENTRY): POC Glucose: 184 mg/dl — AB (ref 70–99)

## 2017-08-14 MED ORDER — CLOPIDOGREL BISULFATE 75 MG PO TABS: 75.0000 mg | ORAL_TABLET | Freq: Every day | ORAL | 1 refills | Status: DC

## 2017-08-14 MED ORDER — LISINOPRIL 10 MG PO TABS: 10.0000 mg | ORAL_TABLET | Freq: Every day | ORAL | 5 refills | Status: DC

## 2017-08-14 MED ORDER — INSULIN SYRINGES (DISPOSABLE) U-100 0.3 ML MISC: 11 refills | Status: AC

## 2017-08-14 NOTE — Progress Notes (Signed)
Patient also need to be cleared for surgery on September 18, 2017.

## 2017-08-14 NOTE — Patient Instructions (Signed)

## 2017-08-14 NOTE — Progress Notes (Signed)
Carelink Summary Report / Loop Recorder 

## 2017-08-14 NOTE — Progress Notes (Signed)
Subjective:  Patient ID: Meghan Ford, female    DOB: 1940/06/19  Age: 77 y.o. MRN: [id number]  CC: Hypertension and Diabetes   HPI [name]  is a 77 year old female with a history of type 2 diabetes mellitus (A1c 6.8), diabetic neuropathy, hypertension, Hyperlipidemia, previous noncompliance, hospitalization at [facility] from 05/07/17 through 05/09/17 for a right PCA stroke here for preoperative clearance for left percutaneous nephrolithotomy by Dr. Alyson Ingles accompanied by her daughter-in-law. Her CT abdomen and pelvis with contrast reveals a 6 mm nonobstructive calculus in the left renal pelvis.  She does have residual numbness on the left side of her face, residual left visual loss and ambulates with the aid of a cane.  She denies recent falls. Her diabetes has improved significantly with an A1c of 6.8 which is down from 12.7 previously and she denies hypoglycemia.  She has been compliant with her antihypertensive and denies adverse effects from her medications. She had a loop recorder placed after her stroke which was last interrogated in 07/11/2017 and revealed no arrhythmia.  She is yet to see neurology for follow-up he remains on Plavix and aspirin and denies recent bleeds.  Past Medical History:  Diagnosis Date  . Arthritis    bil knees  . Blurred vision, left eye   . Diabetes mellitus type II, uncontrolled ([place])   . Diabetes mellitus without complication ([place])   . Fx humeral neck   . Gait disturbance   . History of kidney stones   . Hyperlipidemia LDL goal < 100   . Hypertension   . Left arm weakness   . Leg pain    worse with prolonged standing  . Peripheral vascular disease ([place])   . Pyelonephritis 06/14/2017  . Sepsis ([place]) 06/14/2017  . Status post placement of implantable loop recorder   . Stroke [facility]) 2018; 05/08/2017   denies residual from 2018 stroke on 05/08/2017; weakness on left; speech issues from today's stroke (05/08/2017)  .  Varicose veins     Past Surgical History:  Procedure Laterality Date  . FRACTURE SURGERY Left 2017   Shoulder  . LOOP RECORDER INSERTION N/A 05/09/2017   Procedure: LOOP RECORDER INSERTION;  Surgeon: Constance Haw, MD;  Location: [place] [facility];  Service: Cardiovascular;  Laterality: N/A;  . REVERSE SHOULDER ARTHROPLASTY Left 10/27/2014   Procedure: REVERSE SHOULDER ARTHROPLASTY;  Surgeon: Renette Butters, MD;  Location: [place];  Service: Orthopedics;  Laterality: Left;  . TEE WITHOUT CARDIOVERSION N/A 05/09/2017   Procedure: TRANSESOPHAGEAL ECHOCARDIOGRAM (TEE);  Surgeon: Jerline Pain, MD;  [facility] ENDOSCOPY;  Service: Cardiovascular;  Laterality: N/A;  . TOTAL SHOULDER ARTHROPLASTY Left 10/27/2014   Procedure: TOTAL SHOULDER ARTHROPLASTY;  Surgeon: Renette Butters, MD;  Location: [place];  Service: Orthopedics;  Laterality: Left;  Marland Kitchen VEIN SURGERY Right 1998?   right leg    No Known Allergies   Outpatient Medications Prior to Visit  Medication Sig Dispense Refill  . amLODipine (NORVASC) 10 MG tablet Take 1 tablet (10 mg total) by mouth daily. 30 tablet 5  . aspirin 81 MG tablet Take 4 tablets (325 mg total) by mouth daily. 30 tablet 11  . atorvastatin (LIPITOR) 80 MG tablet Take 1 tablet (80 mg total) by mouth daily. 30 tablet 5  . blood glucose meter kit and supplies KIT Dispense based on patient and insurance preference. Use up to four times daily as directed. (FOR ICD-9 250.00, 250.01). 1 each 0  . Blood Glucose Monitoring Suppl (  TRUE METRIX METER) DEVI 1 each by Does not apply route 3 (three) times daily before meals. 1 Device 0  . gabapentin (NEURONTIN) 300 MG capsule Take 1 capsule (300 mg total) by mouth at bedtime. 30 capsule 5  . glucose blood (TRUE METRIX BLOOD GLUCOSE TEST) test strip Use 3 times daily before meals 100 each 12  . glucose blood test strip Use as instructed 100 each 12  . insulin glargine (LANTUS) 100 UNIT/ML injection Inject 0.35 mLs (35 Units  total) into the skin 2 (two) times daily. 30 mL 11  . isosorbide mononitrate (IMDUR) 60 MG 24 hr tablet Take 1 tablet (60 mg total) by mouth daily. 30 tablet 5  . senna-docusate (SENOKOT-S) 8.6-50 MG tablet Take 1 tablet by mouth at bedtime as needed for mild constipation. 10 tablet 0  . TRUEPLUS LANCETS 28G MISC 1 each by Does not apply route 3 (three) times daily before meals. 100 each 12  . clopidogrel (PLAVIX) 75 MG tablet Take 1 tablet (75 mg total) by mouth daily. 30 tablet 1  . Insulin Syringes, Disposable, U-100 0.3 ML MISC USE AS DIRECTED 100 each 11  . lisinopril (PRINIVIL,ZESTRIL) 10 MG tablet Take 1 tablet (10 mg total) by mouth daily. 30 tablet 0  . cefdinir (OMNICEF) 300 MG capsule Take 1 capsule (300 mg total) by mouth every 12 (twelve) hours. (Patient not taking: Reported on 07/17/2017) 28 capsule 0   No facility-administered medications prior to visit.     ROS Review of Systems  Constitutional: Negative for activity change, appetite change and fatigue.  HENT: Negative for congestion, sinus pressure and sore throat.   Eyes: Positive for visual disturbance.  Respiratory: Negative for cough, chest tightness, shortness of breath and wheezing.   Cardiovascular: Negative for chest pain and palpitations.  Gastrointestinal: Negative for abdominal distention, abdominal pain and constipation.  Endocrine: Negative for polydipsia.  Genitourinary: Negative for dysuria and frequency.  Musculoskeletal: Negative for arthralgias and back pain.  Skin: Negative for rash.  Neurological: Positive for numbness. Negative for tremors and light-headedness.  Hematological: Does not bruise/bleed easily.  Psychiatric/Behavioral: Negative for agitation and behavioral problems.    Objective:  BP (!) 148/66   Pulse 72   Temp 98.2 F (36.8 C) (Oral)   Ht '5\' 4"'$  (1.626 m)   Wt 200 lb 3.2 oz (90.8 kg)   SpO2 97%   BMI 34.36 kg/m   BP/Weight 08/14/2017 10/24/4816 07/17/3147  Systolic BP 702 637 858    Diastolic BP 66 72 56  Wt. (Lbs) 200.2 200.25 -  BMI 34.36 35.47 -      Physical Exam  Constitutional: She is oriented to person, place, and time. She appears well-developed and well-nourished.  Cardiovascular: Normal rate, normal heart sounds and intact distal pulses.  No murmur heard. Pulmonary/Chest: Effort normal and breath sounds normal. She has no wheezes. She has no rales. She exhibits no tenderness.  Abdominal: Soft. Bowel sounds are normal. She exhibits no distension and no mass. There is no tenderness.  Musculoskeletal: Normal range of motion.  Neurological: She is alert and oriented to person, place, and time.  Skin: Skin is warm and dry.  Psychiatric: She has a normal mood and affect.     CMP Latest Ref Rng & Units 08/09/2017 07/17/2017 06/26/2017  Glucose 65 - 99 mg/dL 131(H) 145(H) 116(H)  BUN 6 - 20 mg/dL 25(H) 23(H) 19  Creatinine 0.44 - 1.00 mg/dL 0.82 0.95 0.81  Sodium 135 - 145 mmol/L 141 139 142  Potassium 3.5 - 5.1 mmol/L 4.5 4.0 4.8  Chloride 101 - 111 mmol/L 106 105 102  CO2 22 - 32 mmol/L '25 26 25  '$ Calcium 8.9 - 10.3 mg/dL 9.5 8.7(L) 9.4  Total Protein 6.5 - 8.1 g/dL - 6.4(L) -  Total Bilirubin 0.3 - 1.2 mg/dL - 0.8 -  Alkaline Phos 38 - 126 U/L - 68 -  AST 15 - 41 U/L - 19 -  ALT 14 - 54 U/L - 13(L) -     Lab Results  Component Value Date   HGBA1C 6.8 (H) 08/09/2017    Assessment & Plan:   1. Type 2 diabetes mellitus with other specified complication, with long-term current use of insulin (HCC) Controlled with A1c of 6.8 improved significantly from 12.7 She has been commended Continue current regimen - POCT glucose (manual entry) - Insulin Syringes, Disposable, U-100 0.3 ML MISC; USE AS DIRECTED  Dispense: 100 each; Refill: 11  2. Essential hypertension Controlled Low sodium, DASH diet Continue antihypertensives - lisinopril (PRINIVIL,ZESTRIL) 10 MG tablet; Take 1 tablet (10 mg total) by mouth daily.  Dispense: 30 tablet; Refill: 5  3.  Hydronephrosis concurrent with and due to calculi of kidney and ureter Scheduled for left percutaneous nephrolithotomy She is medically optimized Letter has been faxed to Dr. Tawny Hopping Plavix for 5 days prior to procedure ,we will need to skip morning dose of Lantus on the day of procedure and take half of Lantus dose the night prior.  Resume Plavix 24 hours post procedure; all other medications will stay the same  4. Cerebrovascular accident (CVA) due to embolism of cerebral artery ([place]) Status post loop recorder- recent interrogation revealed no arrhythmias She has not seen neurology since discharge - Ambulatory referral to Neurology - clopidogrel (PLAVIX) 75 MG tablet; Take 1 tablet (75 mg total) by mouth daily.  Dispense: 30 tablet; Refill: 1   Meds ordered this encounter  Medications  . lisinopril (PRINIVIL,ZESTRIL) 10 MG tablet    Sig: Take 1 tablet (10 mg total) by mouth daily.    Dispense:  30 tablet    Refill:  5  . Insulin Syringes, Disposable, U-100 0.3 ML MISC    Sig: USE AS DIRECTED    Dispense:  100 each    Refill:  11  . clopidogrel (PLAVIX) 75 MG tablet    Sig: Take 1 tablet (75 mg total) by mouth daily.    Dispense:  30 tablet    Refill:  1    Follow-up: Return in about 3 months (around 11/14/2017) for Follow-up of chronic medical conditions.   Charlott Rakes MD

## 2017-08-16 ENCOUNTER — Encounter: Payer: Self-pay | Admitting: Neurology

## 2017-08-30 ENCOUNTER — Ambulatory Visit: Payer: Self-pay | Attending: Family Medicine | Admitting: Physician Assistant

## 2017-08-30 VITALS — BP 156/79 | HR 70 | Temp 98.2°F | Resp 18 | Ht 64.0 in | Wt 201.2 lb

## 2017-08-30 DIAGNOSIS — Z758 Other problems related to medical facilities and other health care: Secondary | ICD-10-CM

## 2017-08-30 DIAGNOSIS — E119 Type 2 diabetes mellitus without complications: Secondary | ICD-10-CM

## 2017-08-30 DIAGNOSIS — G629 Polyneuropathy, unspecified: Secondary | ICD-10-CM

## 2017-08-30 DIAGNOSIS — Z794 Long term (current) use of insulin: Secondary | ICD-10-CM | POA: Insufficient documentation

## 2017-08-30 DIAGNOSIS — I10 Essential (primary) hypertension: Secondary | ICD-10-CM

## 2017-08-30 DIAGNOSIS — Z7982 Long term (current) use of aspirin: Secondary | ICD-10-CM | POA: Insufficient documentation

## 2017-08-30 DIAGNOSIS — E1151 Type 2 diabetes mellitus with diabetic peripheral angiopathy without gangrene: Secondary | ICD-10-CM | POA: Insufficient documentation

## 2017-08-30 DIAGNOSIS — Z79899 Other long term (current) drug therapy: Secondary | ICD-10-CM | POA: Insufficient documentation

## 2017-08-30 DIAGNOSIS — E1141 Type 2 diabetes mellitus with diabetic mononeuropathy: Secondary | ICD-10-CM | POA: Insufficient documentation

## 2017-08-30 DIAGNOSIS — E1140 Type 2 diabetes mellitus with diabetic neuropathy, unspecified: Secondary | ICD-10-CM

## 2017-08-30 DIAGNOSIS — IMO0001 Reserved for inherently not codable concepts without codable children: Secondary | ICD-10-CM

## 2017-08-30 DIAGNOSIS — Z8673 Personal history of transient ischemic attack (TIA), and cerebral infarction without residual deficits: Secondary | ICD-10-CM | POA: Insufficient documentation

## 2017-08-30 DIAGNOSIS — Z789 Other specified health status: Secondary | ICD-10-CM

## 2017-08-30 DIAGNOSIS — E7800 Pure hypercholesterolemia, unspecified: Secondary | ICD-10-CM

## 2017-08-30 DIAGNOSIS — Z87442 Personal history of urinary calculi: Secondary | ICD-10-CM | POA: Insufficient documentation

## 2017-08-30 LAB — GLUCOSE, POCT (MANUAL RESULT ENTRY): POC Glucose: 153 mg/dl — AB (ref 70–99)

## 2017-08-30 MED ORDER — LISINOPRIL 10 MG PO TABS: 10.0000 mg | ORAL_TABLET | Freq: Every day | ORAL | 5 refills | Status: DC

## 2017-08-30 MED ORDER — TRAMADOL HCL 50 MG PO TABS: 50.0000 mg | ORAL_TABLET | Freq: Three times a day (TID) | ORAL | 0 refills | Status: DC | PRN

## 2017-08-30 MED ORDER — AMLODIPINE BESYLATE 10 MG PO TABS: 10.0000 mg | ORAL_TABLET | Freq: Every day | ORAL | 5 refills | Status: DC

## 2017-08-30 MED ORDER — ATORVASTATIN CALCIUM 80 MG PO TABS: 80.0000 mg | ORAL_TABLET | Freq: Every day | ORAL | 5 refills | Status: DC

## 2017-08-30 MED ORDER — GABAPENTIN 300 MG PO CAPS: 300.0000 mg | ORAL_CAPSULE | Freq: Every day | ORAL | 5 refills | Status: DC

## 2017-08-30 MED FILL — AMLODIPINE BESYLATE 10 MG T: 10 | 30 days supply | Qty: 30 | Fill #0

## 2017-08-30 MED FILL — traMADol HCL 50 MG TABS: 50 | 10 days supply | Qty: 30 | Fill #0

## 2017-08-30 MED FILL — LISINOPRIL 10 MG TABS: 10 | 30 days supply | Qty: 30 | Fill #0

## 2017-08-30 NOTE — Progress Notes (Signed)
Patient ID: Meghan Ford, female   DOB: 1940/12/16, 77 y.o.   MRN: [id number]      Meghan Ford, is a 77 y.o. female  TDH:[id number]  MIW:[id number]  DOB - 03-Jun-1940  Subjective:  Chief Complaint and HPI: Meghan Ford is a 77 y.o. female here today  Pain in R leg and R leg paresthesias.  She is out of gabapentin.  OTC helps little.  Blood sugars running 100-150.  Out of all BP meds.    "Viviana" with stratus interpreters translating  ROS:   Constitutional:  No f/c, No night sweats, No unexplained weight loss. EENT:  No vision changes, No blurry vision, No hearing changes. No mouth, throat, or ear problems.  Respiratory: No cough, No SOB Cardiac: No CP, no palpitations GI:  No abd pain, No N/V/D. GU: No Urinary s/sx Musculoskeletal: R leg pain Neuro: No headache, no dizziness, no motor weakness.  Skin: No rash Endocrine:  No polydipsia. No polyuria.  Psych: Denies SI/HI  No problems updated.  ALLERGIES: No Known Allergies  PAST MEDICAL HISTORY: Past Medical History:  Diagnosis Date  . Arthritis    bil knees  . Blurred vision, left eye   . Diabetes mellitus type II, uncontrolled ([place])   . Diabetes mellitus without complication ([place])   . Fx humeral neck   . Gait disturbance   . History of kidney stones   . Hyperlipidemia LDL goal < 100   . Hypertension   . Left arm weakness   . Leg pain    worse with prolonged standing  . Peripheral vascular disease ([place])   . Pyelonephritis 06/14/2017  . Sepsis ([place]) 06/14/2017  . Status post placement of implantable loop recorder   . Stroke [facility]) 2018; 05/08/2017   denies residual from 2018 stroke on 05/08/2017; weakness on left; speech issues from today's stroke (05/08/2017)  . Varicose veins     MEDICATIONS AT HOME: Prior to Admission medications   Medication Sig Start Date End Date Taking? Authorizing Provider  amLODipine (NORVASC) 10 MG tablet Take 1 tablet (10 mg total) by mouth  daily. 08/30/17   Argentina Donovan, PA-C  aspirin 81 MG tablet Take 4 tablets (325 mg total) by mouth daily. 06/26/17   Colbert Ewing, MD  atorvastatin (LIPITOR) 80 MG tablet Take 1 tablet (80 mg total) by mouth daily. 08/30/17   Argentina Donovan, PA-C  blood glucose meter kit and supplies KIT Dispense based on patient and insurance preference. Use up to four times daily as directed. (FOR ICD-9 250.00, 250.01). 11/09/16   Hosie Poisson, MD  Blood Glucose Monitoring Suppl (TRUE METRIX METER) DEVI 1 each by Does not apply route 3 (three) times daily before meals. 05/14/17   Charlott Rakes, MD  clopidogrel (PLAVIX) 75 MG tablet Take 1 tablet (75 mg total) by mouth daily. 08/14/17   Charlott Rakes, MD  gabapentin (NEURONTIN) 300 MG capsule Take 1 capsule (300 mg total) by mouth at bedtime. 08/30/17   Argentina Donovan, PA-C  glucose blood (TRUE METRIX BLOOD GLUCOSE TEST) test strip Use 3 times daily before meals 05/14/17   Charlott Rakes, MD  glucose blood test strip Use as instructed 11/09/16   Hosie Poisson, MD  insulin glargine (LANTUS) 100 UNIT/ML injection Inject 0.35 mLs (35 Units total) into the skin 2 (two) times daily. 08/09/17   Charlott Rakes, MD  Insulin Syringes, Disposable, U-100 0.3 ML MISC USE AS DIRECTED 08/14/17   Charlott Rakes, MD  isosorbide mononitrate (IMDUR) 60 MG 24 hr  tablet Take 1 tablet (60 mg total) by mouth daily. 05/14/17   Charlott Rakes, MD  lisinopril (PRINIVIL,ZESTRIL) 10 MG tablet Take 1 tablet (10 mg total) by mouth daily. 08/30/17 08/30/18  Argentina Donovan, PA-C  senna-docusate (SENOKOT-S) 8.6-50 MG tablet Take 1 tablet by mouth at bedtime as needed for mild constipation. 05/09/17   Aline August, MD  traMADol (ULTRAM) 50 MG tablet Take 1 tablet (50 mg total) by mouth every 8 (eight) hours as needed. 08/30/17   Argentina Donovan, PA-C  TRUEPLUS LANCETS 28G MISC 1 each by Does not apply route 3 (three) times daily before meals. 05/14/17   Charlott Rakes, MD     Objective:    EXAM:   There were no vitals filed for this visit.  General appearance : A&OX3. NAD. Non-toxic-appearing HEENT: Atraumatic and Normocephalic.  PERRLA. EOM intact.   Neck: supple, no JVD. No cervical lymphadenopathy. No thyromegaly Chest/Lungs:  Breathing-non-labored, Good air entry bilaterally, breath sounds normal without rales, rhonchi, or wheezing  CVS: S1 S2 regular, no murmurs, gallops, rubs  Extremities: Bilateral Lower Ext shows no edema, both legs are warm to touch with = pulse throughout Neurology:  CN II-XII grossly intact, Non focal.   Psych:  TP linear. J/I WNL. Normal speech. Appropriate eye contact and affect.  Skin:  No Rash  Data Review Lab Results  Component Value Date   HGBA1C 6.8 (H) 08/09/2017   HGBA1C 12.7 (H) 05/08/2017   HGBA1C 13.0 (H) 11/08/2016     Assessment & Plan   1. Neuropathy of R leg - gabapentin (NEURONTIN) 300 MG capsule; Take 1 capsule (300 mg total) by mouth at bedtime.  Dispense: 30 capsule; Refill: 5  2. Insulin dependent diabetes mellitus (HCC) - Glucose (CBG)  3. Essential hypertension Resume meds - amLODipine (NORVASC) 10 MG tablet; Take 1 tablet (10 mg total) by mouth daily.  Dispense: 30 tablet; Refill: 5 - lisinopril (PRINIVIL,ZESTRIL) 10 MG tablet; Take 1 tablet (10 mg total) by mouth daily.  Dispense: 30 tablet; Refill: 5  4. Pure hypercholesterolemia - atorvastatin (LIPITOR) 80 MG tablet; Take 1 tablet (80 mg total) by mouth daily.  Dispense: 30 tablet; Refill: 5  5. Type 2 diabetes mellitus with diabetic neuropathy, with long-term current use of insulin (HCC) Continue lantus bid - gabapentin (NEURONTIN) 300 MG capsule; Take 1 capsule (300 mg total) by mouth at bedtime.  Dispense: 30 capsule; Refill: 5  6. Language barrier stratus interpreters used and additional time performing visit was required.   Patient have been counseled extensively about nutrition and exercise  Return for keep f/up appt with Dr Margarita Rana.  The  patient was given clear instructions to go to ER or return to medical center if symptoms don't improve, worsen or new problems develop. The patient verbalized understanding. The patient was told to call to get lab results if they haven't heard anything in the next week.     Freeman Caldron, PA-C [facility] and [place], [place]   08/30/2017, 3:19 PM

## 2017-09-05 MED FILL — GABAPENTIN 300 MG CAPSULE: 300 | 30 days supply | Qty: 30 | Fill #3

## 2017-09-05 MED FILL — $LANTUS 100 UNITS/ML VIAL: 100 | 28 days supply | Qty: 20 | Fill #1

## 2017-09-05 MED FILL — ATORVASTATIN 80 MG TABLET: 80 | 30 days supply | Qty: 30 | Fill #3

## 2017-09-05 MED FILL — CLOPIDOGREL 75 MG TABLET: 75 | 30 days supply | Qty: 30 | Fill #2

## 2017-09-07 NOTE — Progress Notes (Signed)
Left message with Dr. Macario CarlsMckenzies scheduler that we are unable to reach pt. After multiple attempts for a preop appt.,

## 2017-09-10 ENCOUNTER — Telehealth: Payer: Self-pay | Admitting: Cardiology

## 2017-09-10 NOTE — Telephone Encounter (Signed)
LMOVM requesting that pt send manual transmission b/c home monitor has not updated in at least 14 days.    

## 2017-09-12 NOTE — Patient Instructions (Addendum)
Meghan Ford  09/12/2017   Your procedure is scheduled on: 09/18/2017     Report to [facility] Main  Entrance    Report to admitting at   7:30am  Then you will go to Radiology to check in then to Short Stay where the nurses will get you ready for your procedure.      Call this number if you have problems the morning of surgery [phone]     Remember: Do not eat food or drink liquids :After Midnight.    Eat a good healthy snack prior to bedtime.    Take these medicines the morning of surgery with A SIP OF WATER: Amlodipine, Imdur                                 You may not have any metal on your body including hair pins and              piercings  Do not wear jewelry, make-up, lotions, powders or perfumes, deodorant             Do not wear nail polish.  Do not shave  48 hours prior to surgery.     Do not bring valuables to the hospital. [place] IS NOT             RESPONSIBLE   FOR VALUABLES.  Contacts, dentures or bridgework may not be worn into surgery.  Leave suitcase in the car. After surgery it may be brought to your room.                  Please read over the following fact sheets you were given: _____________________________________________________________________             [facility] - Preparing for Surgery Before surgery, you can play an important role.  Because skin is not sterile, your skin needs to be as free of germs as possible.  You can reduce the number of germs on your skin by washing with CHG (chlorahexidine gluconate) soap before surgery.  CHG is an antiseptic cleaner which kills germs and bonds with the skin to continue killing germs even after washing. Please DO NOT use if you have an allergy to CHG or antibacterial soaps.  If your skin becomes reddened/irritated stop using the CHG and inform your nurse when you arrive at Short Stay. Do not shave (including legs and underarms) for at least 48 hours prior to the  first CHG shower.  You may shave your face/neck. Please follow these instructions carefully:  1.  Shower with CHG Soap the night before surgery and the  morning of Surgery.  2.  If you choose to wash your hair, wash your hair first as usual with your  normal  shampoo.  3.  After you shampoo, rinse your hair and body thoroughly to remove the  shampoo.                           4.  Use CHG as you would any other liquid soap.  You can apply chg directly  to the skin and wash                       Gently with a scrungie or clean washcloth.  5.  Apply  the CHG Soap to your body ONLY FROM THE NECK DOWN.   Do not use on face/ open                           Wound or open sores. Avoid contact with eyes, ears mouth and genitals (private parts).                       Wash face,  Genitals (private parts) with your normal soap.             6.  Wash thoroughly, paying special attention to the area where your surgery  will be performed.  7.  Thoroughly rinse your body with warm water from the neck down.  8.  DO NOT shower/wash with your normal soap after using and rinsing off  the CHG Soap.                9.  Pat yourself dry with a clean towel.            10.  Wear clean pajamas.            11.  Place clean sheets on your bed the night of your first shower and do not  sleep with pets. Day of Surgery : Do not apply any lotions/deodorants the morning of surgery.  Please wear clean clothes to the hospital/surgery center.  FAILURE TO FOLLOW THESE INSTRUCTIONS MAY RESULT IN THE CANCELLATION OF YOUR SURGERY PATIENT SIGNATURE_________________________________  NURSE SIGNATURE__________________________________  ________________________________________________________________________     How to Manage Your Diabetes Before and After Surgery  Why is it important to control my blood sugar before and after surgery? . Improving blood sugar levels before and after surgery helps healing and can limit problems. . A  way of improving blood sugar control is eating a healthy diet by: o  Eating less sugar and carbohydrates o  Increasing activity/exercise o  Talking with your doctor about reaching your blood sugar goals . High blood sugars (greater than 180 mg/dL) can raise your risk of infections and slow your recovery, so you will need to focus on controlling your diabetes during the weeks before surgery. . Make sure that the doctor who takes care of your diabetes knows about your planned surgery including the date and location.  How do I manage my blood sugar before surgery? . Check your blood sugar at least 4 times a day, starting 2 days before surgery, to make sure that the level is not too high or low. o Check your blood sugar the morning of your surgery when you wake up and every 2 hours until you get to the Short Stay unit. . If your blood sugar is less than 70 mg/dL, you will need to treat for low blood sugar: o Do not take insulin. o Treat a low blood sugar (less than 70 mg/dL) with  cup of clear juice (cranberry or apple), 4 glucose tablets, OR glucose gel. o Recheck blood sugar in 15 minutes after treatment (to make sure it is greater than 70 mg/dL). If your blood sugar is not greater than 70 mg/dL on recheck, call [phone] for further instructions. . Report your blood sugar to the short stay nurse when you get to Short Stay.  . If you are admitted to the hospital after surgery: o Your blood sugar will be checked by the staff and you will probably be given insulin after surgery (instead of oral  diabetes medicines) to make sure you have good blood sugar levels. o The goal for blood sugar control after surgery is 80-180 mg/dL.   WHAT DO I DO ABOUT MY DIABETES MEDICATION?   . THE NIGHT BEFORE SURGERY, take   1/2 of dose of Lantus Insulin.        . THE MORNING OF SURGERY, take   1/2 dose of Lantus Insulin     Patient Signature:  Date:   Nurse Signature:  Date:   Reviewed and Endorsed by  [facility] Patient Education Committee, August 2015

## 2017-09-12 NOTE — Progress Notes (Signed)
EKg-07/17/17-epic  Echo-05/09/17-epic cxr-07/16/17-epic

## 2017-09-14 ENCOUNTER — Other Ambulatory Visit: Payer: Self-pay

## 2017-09-14 ENCOUNTER — Encounter (HOSPITAL_COMMUNITY)
Admission: RE | Admit: 2017-09-14 | Discharge: 2017-09-14 | Disposition: A | Discharge status: Home / Self Care | Payer: Self-pay | Source: Ambulatory Visit | Attending: Urology | Admitting: Urology

## 2017-09-14 ENCOUNTER — Encounter (HOSPITAL_COMMUNITY): Payer: Self-pay

## 2017-09-14 DIAGNOSIS — Z01812 Encounter for preprocedural laboratory examination: Secondary | ICD-10-CM | POA: Insufficient documentation

## 2017-09-14 DIAGNOSIS — Z0183 Encounter for blood typing: Secondary | ICD-10-CM | POA: Insufficient documentation

## 2017-09-14 HISTORY — DX: Cardiac arrhythmia, unspecified: I49.9

## 2017-09-14 HISTORY — DX: Other visual disturbances: H53.8

## 2017-09-14 LAB — GLUCOSE, CAPILLARY: Glucose-Capillary: 184 mg/dL — ABNORMAL HIGH (ref 70–99)

## 2017-09-14 LAB — CBC
HEMATOCRIT: 45 % (ref 36.0–46.0)
HEMOGLOBIN: 14.3 g/dL (ref 12.0–15.0)
MCH: 25.6 pg — AB (ref 26.0–34.0)
MCHC: 31.8 g/dL (ref 30.0–36.0)
MCV: 80.5 fL (ref 78.0–100.0)
Platelets: 266 10*3/uL (ref 150–400)
RBC: 5.59 MIL/uL — ABNORMAL HIGH (ref 3.87–5.11)
RDW: 15 % (ref 11.5–15.5)
WBC: 10.2 10*3/uL (ref 4.0–10.5)

## 2017-09-14 LAB — HEMOGLOBIN A1C
Hgb A1c MFr Bld: 7.4 % — ABNORMAL HIGH (ref 4.8–5.6)
Mean Plasma Glucose: 165.68 mg/dL

## 2017-09-14 LAB — BASIC METABOLIC PANEL
ANION GAP: 10 (ref 5–15)
BUN: 30 mg/dL — AB (ref 8–23)
CO2: 27 mmol/L (ref 22–32)
Calcium: 9.4 mg/dL (ref 8.9–10.3)
Chloride: 102 mmol/L (ref 98–111)
Creatinine, Ser: 1.04 mg/dL — ABNORMAL HIGH (ref 0.44–1.00)
GFR calc Af Amer: 59 mL/min — ABNORMAL LOW (ref >60)
GFR calc non Af Amer: 51 mL/min — ABNORMAL LOW (ref >60)
GLUCOSE: 179 mg/dL — AB (ref 70–99)
POTASSIUM: 4.9 mmol/L (ref 3.5–5.1)
Sodium: 139 mmol/L (ref 135–145)

## 2017-09-14 NOTE — Progress Notes (Signed)
BMP ROUTED VIA Epic TO DR P. MCKENZIE

## 2017-09-17 ENCOUNTER — Other Ambulatory Visit: Payer: Self-pay | Admitting: Radiology

## 2017-09-17 ENCOUNTER — Ambulatory Visit (INDEPENDENT_AMBULATORY_CARE_PROVIDER_SITE_OTHER): Payer: Self-pay | Admitting: *Deleted

## 2017-09-17 DIAGNOSIS — I639 Cerebral infarction, unspecified: Secondary | ICD-10-CM

## 2017-09-17 NOTE — Progress Notes (Signed)
Carelink Summary Report / Loop Recorder 

## 2017-09-18 ENCOUNTER — Ambulatory Visit (HOSPITAL_COMMUNITY)
Admission: RE | Admit: 2017-09-18 | Discharge: 2017-09-18 | Disposition: A | Discharge status: Home / Self Care | Payer: Self-pay | Source: Ambulatory Visit | Attending: Urology | Admitting: Urology

## 2017-09-18 ENCOUNTER — Encounter (HOSPITAL_COMMUNITY)
Admission: RE | Disposition: A | Discharge status: Home / Self Care | Payer: Self-pay | Source: Ambulatory Visit | Attending: Urology

## 2017-09-18 ENCOUNTER — Ambulatory Visit (HOSPITAL_COMMUNITY): Payer: Self-pay

## 2017-09-18 ENCOUNTER — Encounter (HOSPITAL_COMMUNITY): Payer: Self-pay

## 2017-09-18 ENCOUNTER — Ambulatory Visit (HOSPITAL_COMMUNITY): Payer: Self-pay | Admitting: Anesthesiology

## 2017-09-18 DIAGNOSIS — I10 Essential (primary) hypertension: Secondary | ICD-10-CM | POA: Insufficient documentation

## 2017-09-18 DIAGNOSIS — Z87442 Personal history of urinary calculi: Secondary | ICD-10-CM | POA: Insufficient documentation

## 2017-09-18 DIAGNOSIS — N200 Calculus of kidney: Secondary | ICD-10-CM

## 2017-09-18 DIAGNOSIS — Z79899 Other long term (current) drug therapy: Secondary | ICD-10-CM | POA: Insufficient documentation

## 2017-09-18 HISTORY — PX: HOLMIUM LASER APPLICATION: SHX5852

## 2017-09-18 HISTORY — PX: NEPHROLITHOTOMY: SHX5134

## 2017-09-18 LAB — APTT: APTT: 20 s — AB (ref 24–36)

## 2017-09-18 LAB — GLUCOSE, CAPILLARY
GLUCOSE-CAPILLARY: 167 mg/dL — AB (ref 70–99)
GLUCOSE-CAPILLARY: 167 mg/dL — AB (ref 70–99)

## 2017-09-18 LAB — TYPE AND SCREEN
ABO/RH(D): O POS
Antibody Screen: NEGATIVE

## 2017-09-18 LAB — PROTIME-INR
INR: 1.02
PROTHROMBIN TIME: 13.3 s (ref 11.4–15.2)

## 2017-09-18 SURGERY — NEPHROLITHOTOMY PERCUTANEOUS
Anesthesia: General | Laterality: Left

## 2017-09-18 MED ORDER — FENTANYL CITRATE (PF) 100 MCG/2ML IJ SOLN
INTRAMUSCULAR | Status: AC
  Filled 2017-09-18: qty 2

## 2017-09-18 MED ORDER — ONDANSETRON HCL 4 MG/2ML IJ SOLN
INTRAMUSCULAR | Status: AC
  Administered 2017-09-18: 4 mg
  Filled 2017-09-18: qty 2

## 2017-09-18 MED ORDER — OXYCODONE HCL 5 MG/5ML PO SOLN
5.0000 mg | Freq: Once | ORAL | Status: DC | PRN
  Filled 2017-09-18: qty 5

## 2017-09-18 MED ORDER — LIDOCAINE 2% (20 MG/ML) 5 ML SYRINGE
INTRAMUSCULAR | Status: AC
  Filled 2017-09-18: qty 10

## 2017-09-18 MED ORDER — LACTATED RINGERS IV SOLN
INTRAVENOUS | Status: DC | PRN
  Administered 2017-09-18: 13:00:00 via INTRAVENOUS

## 2017-09-18 MED ORDER — SODIUM CHLORIDE 0.9 % IV SOLN
INTRAVENOUS | Status: DC
  Administered 2017-09-18 (×2): via INTRAVENOUS

## 2017-09-18 MED ORDER — CEFAZOLIN SODIUM-DEXTROSE 2-4 GM/100ML-% IV SOLN: 2.0000 g | INTRAVENOUS | Status: DC

## 2017-09-18 MED ORDER — FENTANYL CITRATE (PF) 100 MCG/2ML IJ SOLN
25.0000 ug | INTRAMUSCULAR | Status: DC | PRN
  Administered 2017-09-18 (×2): 25 ug via INTRAVENOUS
  Administered 2017-09-18: 50 ug via INTRAVENOUS

## 2017-09-18 MED ORDER — ONDANSETRON HCL 4 MG/2ML IJ SOLN
INTRAMUSCULAR | Status: AC
  Filled 2017-09-18: qty 4

## 2017-09-18 MED ORDER — SODIUM CHLORIDE 0.9 % IR SOLN
Status: DC | PRN
  Administered 2017-09-18: 3000 mL via INTRAVESICAL

## 2017-09-18 MED ORDER — FENTANYL CITRATE (PF) 100 MCG/2ML IJ SOLN
INTRAMUSCULAR | Status: DC | PRN
  Administered 2017-09-18: 50 ug via INTRAVENOUS
  Administered 2017-09-18 (×2): 25 ug via INTRAVENOUS

## 2017-09-18 MED ORDER — TAMSULOSIN HCL 0.4 MG PO CAPS: 0.4000 mg | ORAL_CAPSULE | Freq: Every day | ORAL | 0 refills | Status: DC

## 2017-09-18 MED ORDER — ONDANSETRON 4 MG PO TBDP: 4.0000 mg | ORAL_TABLET | Freq: Three times a day (TID) | ORAL | 0 refills | Status: DC | PRN

## 2017-09-18 MED ORDER — OXYCODONE HCL 5 MG PO TABS: 5.0000 mg | ORAL_TABLET | Freq: Once | ORAL | Status: DC | PRN

## 2017-09-18 MED ORDER — EPHEDRINE 5 MG/ML INJ
INTRAVENOUS | Status: AC
  Filled 2017-09-18: qty 10

## 2017-09-18 MED ORDER — ROCURONIUM BROMIDE 100 MG/10ML IV SOLN
INTRAVENOUS | Status: AC
  Filled 2017-09-18: qty 1

## 2017-09-18 MED ORDER — CEFAZOLIN SODIUM-DEXTROSE 2-4 GM/100ML-% IV SOLN
2.0000 g | Freq: Once | INTRAVENOUS | Status: AC
  Administered 2017-09-18: 2 g via INTRAVENOUS
  Filled 2017-09-18: qty 100

## 2017-09-18 MED ORDER — DEXAMETHASONE SODIUM PHOSPHATE 10 MG/ML IJ SOLN
INTRAMUSCULAR | Status: AC
  Filled 2017-09-18: qty 2

## 2017-09-18 MED ORDER — LIDOCAINE 2% (20 MG/ML) 5 ML SYRINGE
INTRAMUSCULAR | Status: DC | PRN
  Administered 2017-09-18: 80 mg via INTRAVENOUS
  Administered 2017-09-18: 20 mg via INTRAVENOUS

## 2017-09-18 MED ORDER — DEXAMETHASONE SODIUM PHOSPHATE 10 MG/ML IJ SOLN
INTRAMUSCULAR | Status: DC | PRN
  Administered 2017-09-18: 4 mg via INTRAVENOUS

## 2017-09-18 MED ORDER — PROPOFOL 10 MG/ML IV BOLUS
INTRAVENOUS | Status: DC | PRN
  Administered 2017-09-18: 120 mg via INTRAVENOUS
  Administered 2017-09-18 (×2): 30 mg via INTRAVENOUS

## 2017-09-18 MED ORDER — HYDROMORPHONE HCL 1 MG/ML IJ SOLN: 0.2500 mg | INTRAMUSCULAR | Status: DC | PRN

## 2017-09-18 MED ORDER — EPHEDRINE SULFATE-NACL 50-0.9 MG/10ML-% IV SOSY
PREFILLED_SYRINGE | INTRAVENOUS | Status: DC | PRN
  Administered 2017-09-18: 10 mg via INTRAVENOUS
  Administered 2017-09-18: 5 mg via INTRAVENOUS

## 2017-09-18 MED ORDER — PROMETHAZINE HCL 25 MG/ML IJ SOLN
6.2500 mg | INTRAMUSCULAR | Status: DC | PRN
Start: 2017-09-18 — End: 2017-09-18

## 2017-09-18 MED ORDER — TRAMADOL HCL 50 MG PO TABS: 50.0000 mg | ORAL_TABLET | Freq: Four times a day (QID) | ORAL | 0 refills | Status: DC | PRN

## 2017-09-18 MED ORDER — ONDANSETRON HCL 4 MG/2ML IJ SOLN
INTRAMUSCULAR | Status: DC | PRN
  Administered 2017-09-18: 4 mg via INTRAVENOUS

## 2017-09-18 MED ORDER — IOHEXOL 300 MG/ML  SOLN
INTRAMUSCULAR | Status: DC | PRN
  Administered 2017-09-18: 6 mL via URETHRAL

## 2017-09-18 SURGICAL SUPPLY — 68 items
APL SKNCLS STERI-STRIP NONHPOA (GAUZE/BANDAGES/DRESSINGS)
BAG URINE DRAINAGE (UROLOGICAL SUPPLIES) ×2 IMPLANT
BASKET STONE NITINOL 3FRX115MB (UROLOGICAL SUPPLIES) IMPLANT
BASKET ZERO TIP NITINOL 2.4FR (BASKET) ×2 IMPLANT
BENZOIN TINCTURE PRP APPL 2/3 (GAUZE/BANDAGES/DRESSINGS) ×2 IMPLANT
BLADE SURG 15 STRL LF DISP TIS (BLADE) ×1 IMPLANT
BLADE SURG 15 STRL SS (BLADE)
BSKT STON RTRVL ZERO TP 2.4FR (BASKET) ×1
CATCHER STONE W/TUBE ADAPTER (UROLOGICAL SUPPLIES) IMPLANT
CATH FOLEY 2W COUNCIL 20FR 5CC (CATHETERS) IMPLANT
CATH FOLEY 2WAY SLVR  5CC 16FR (CATHETERS)
CATH FOLEY 2WAY SLVR  5CC 18FR (CATHETERS)
CATH FOLEY 2WAY SLVR 5CC 16FR (CATHETERS) ×1 IMPLANT
CATH FOLEY 2WAY SLVR 5CC 18FR (CATHETERS) ×1 IMPLANT
CATH IMAGER II 65CM (CATHETERS) ×1 IMPLANT
CATH INTERMIT  6FR 70CM (CATHETERS) ×3 IMPLANT
CATH ROBINSON RED A/P 20FR (CATHETERS) IMPLANT
CATH URET DUAL LUMEN 6-10FR 50 (CATHETERS) IMPLANT
CATH X-FORCE N30 NEPHROSTOMY (TUBING) ×1 IMPLANT
COVER SURGICAL LIGHT HANDLE (MISCELLANEOUS) ×3 IMPLANT
DRAPE C-ARM 42X120 X-RAY (DRAPES) ×1 IMPLANT
DRAPE LINGEMAN PERC (DRAPES) ×1 IMPLANT
DRAPE SHEET LG 3/4 BI-LAMINATE (DRAPES) ×3 IMPLANT
DRAPE SURG IRRIG POUCH 19X23 (DRAPES) ×1 IMPLANT
DRSG PAD ABDOMINAL 8X10 ST (GAUZE/BANDAGES/DRESSINGS) ×2 IMPLANT
DRSG TEGADERM 8X12 (GAUZE/BANDAGES/DRESSINGS) ×2 IMPLANT
FIBER LASER FLEXIVA 1000 (UROLOGICAL SUPPLIES) IMPLANT
FIBER LASER FLEXIVA 365 (UROLOGICAL SUPPLIES) IMPLANT
FIBER LASER FLEXIVA 550 (UROLOGICAL SUPPLIES) IMPLANT
FIBER LASER TRAC TIP (UROLOGICAL SUPPLIES) ×2 IMPLANT
GAUZE SPONGE 4X4 12PLY STRL (GAUZE/BANDAGES/DRESSINGS) ×1 IMPLANT
GLOVE BIO SURGEON STRL SZ8 (GLOVE) ×3 IMPLANT
GLOVE BIOGEL PI IND STRL 8 (GLOVE) IMPLANT
GLOVE BIOGEL PI INDICATOR 8 (GLOVE) ×2
GOWN STRL REUS W/TWL LRG LVL3 (GOWN DISPOSABLE) ×4 IMPLANT
GOWN STRL REUS W/TWL XL LVL3 (GOWN DISPOSABLE) ×1 IMPLANT
GUIDEWIRE AMPLAZ .035X145 (WIRE) ×2 IMPLANT
GUIDEWIRE STR DUAL SENSOR (WIRE) ×6 IMPLANT
IV SET EXTENSION CATH 6 NF (IV SETS) ×1 IMPLANT
KIT BASIN OR (CUSTOM PROCEDURE TRAY) ×1 IMPLANT
MANIFOLD NEPTUNE II (INSTRUMENTS) ×3 IMPLANT
NDL TROCAR 18X15 ECHO (NEEDLE) IMPLANT
NDL TROCAR 18X20 (NEEDLE) IMPLANT
NEEDLE TROCAR 18X15 ECHO (NEEDLE) IMPLANT
NEEDLE TROCAR 18X20 (NEEDLE) IMPLANT
NS IRRIG 1000ML POUR BTL (IV SOLUTION) ×1 IMPLANT
PACK CYSTO (CUSTOM PROCEDURE TRAY) ×3 IMPLANT
PROBE LITHOCLAST ULTRA 3.8X403 (UROLOGICAL SUPPLIES) IMPLANT
PROBE PNEUMATIC 1.0MMX570MM (UROLOGICAL SUPPLIES) IMPLANT
SET AMPLATZ RENAL DILATOR (MISCELLANEOUS) IMPLANT
SET IRRIG Y TYPE TUR BLADDER L (SET/KITS/TRAYS/PACK) ×1 IMPLANT
SHEATH PEELAWAY SET 9 (SHEATH) IMPLANT
SHEATH URETERAL 12FRX35CM (MISCELLANEOUS) ×2 IMPLANT
SPONGE LAP 4X18 RFD (DISPOSABLE) ×1 IMPLANT
STENT CONTOUR 6FRX26X.038 (STENTS) IMPLANT
STENT URET 6FRX24 CONTOUR (STENTS) ×2 IMPLANT
STONE CATCHER W/TUBE ADAPTER (UROLOGICAL SUPPLIES) IMPLANT
SUT SILK 2 0 30  PSL (SUTURE)
SUT SILK 2 0 30 PSL (SUTURE) ×1 IMPLANT
SYR 10ML LL (SYRINGE) ×1 IMPLANT
SYR 20CC LL (SYRINGE) ×2 IMPLANT
SYR 30ML LL (SYRINGE) ×2 IMPLANT
SYR 50ML LL SCALE MARK (SYRINGE) ×1 IMPLANT
TOWEL OR 17X26 10 PK STRL BLUE (TOWEL DISPOSABLE) ×1 IMPLANT
TUBING CONNECTING 10 (TUBING) ×2 IMPLANT
TUBING CONNECTING 10' (TUBING)
TUBING UROLOGY SET (TUBING) ×2 IMPLANT
WATER STERILE IRR 1000ML POUR (IV SOLUTION) ×1 IMPLANT

## 2017-09-18 NOTE — Anesthesia Procedure Notes (Signed)
Procedure Name: LMA Insertion Date/Time: 09/18/2017 12:48 PM Performed by: Theodosia QuayKey, Colm Lyford, CRNA Pre-anesthesia Checklist: Patient identified, Emergency Drugs available, Suction available and Patient being monitored Patient Re-evaluated:Patient Re-evaluated prior to induction Oxygen Delivery Method: Circle System Utilized Preoxygenation: Pre-oxygenation with 100% oxygen Induction Type: IV induction Ventilation: Mask ventilation without difficulty LMA: LMA with gastric port inserted LMA Size: 4.0 Number of attempts: 1 Airway Equipment and Method: Bite block Placement Confirmation: positive ETCO2 Tube secured with: Tape Dental Injury: Teeth and Oropharynx as per pre-operative assessment

## 2017-09-18 NOTE — Anesthesia Postprocedure Evaluation (Signed)
Anesthesia Post Note  Patient: Meghan Ford  Procedure(s) Performed: CYSTOSCOPY/LEFT URETEROSCOPIC STONE EXTRACTION/LEFT RETROGRADE/ STENT PLACEMENT (Left ) HOLMIUM LASER APPLICATION (Left )     Patient location during evaluation: PACU Anesthesia Type: General Level of consciousness: awake and alert Pain management: pain level controlled Vital Signs Assessment: post-procedure vital signs reviewed and stable Respiratory status: spontaneous breathing, nonlabored ventilation and respiratory function stable Cardiovascular status: blood pressure returned to baseline and stable Postop Assessment: no apparent nausea or vomiting Anesthetic complications: no    Last Vitals:  Vitals:   09/18/17 1500 09/18/17 1515  BP: (!) 144/64 (!) 147/62  Pulse:  72  Resp:  15  Temp: 36.8 C 36.5 C  SpO2:  93%    Last Pain:  Vitals:   09/18/17 0821  PainSc: 0-No pain                 Lowella CurbWarren Ray Meghan Ford

## 2017-09-18 NOTE — Progress Notes (Signed)
Interpreter from language resources arrived late today .  Her name is Meghan Ford.  She is at the bedside with pt and pt's daughter in law.

## 2017-09-18 NOTE — Transfer of Care (Signed)
Immediate Anesthesia Transfer of Care Note  Patient: Meghan Ford  Procedure(s) Performed: CYSTOSCOPY/LEFT URETEROSCOPIC STONE EXTRACTION/LEFT RETROGRADE/ STENT PLACEMENT (Left ) HOLMIUM LASER APPLICATION (Left )  Patient Location: PACU  Anesthesia Type:General  Level of Consciousness: awake and alert   Airway & Oxygen Therapy: Patient connected to face mask oxygen  Post-op Assessment: Report given to RN  Post vital signs: stable  Last Vitals:  Vitals Value Taken Time  BP    Temp    Pulse    Resp    SpO2      Last Pain:  Vitals:   09/18/17 0821  PainSc: 0-No pain         Complications: No apparent anesthesia complications

## 2017-09-18 NOTE — H&P (Signed)
Urology History and Physical  Admitting Attending: Wilkie AyePatrick McKenzie, MD  Chief Complaint:  Nephrolithiasis  HPI: Meghan Ford is a 77 y.o. female with nephrolithiasis.  Patient here today for planned left PCNL, however CT scan performed prior to IR L nephrostomy tube placement showed only ~285mm non-obstructing left renal stone. This was discussed with patient who wishes to proceed with ureteroscopic stone extraction.  Patient confirms she has been off of Plavix x5 days and has been taking Bactrim x7 days as prescribed. She denies UTI symptoms today.   Past Medical History: Past Medical History:  Diagnosis Date  . Arthritis    bil knees  . Blurred vision, left eye   . Blurry vision, bilateral   . Diabetes mellitus type II, uncontrolled (HCC)   . Diabetes mellitus without complication (HCC)   . Dysrhythmia    patient via intepreter reports at time of last stroke , her heartbeat was very irregular ; HR WDL today, patient denies any headache, chest pain, new numbness or new worsening vision since last stroke   . Fx humeral neck   . Gait disturbance   . History of kidney stones   . Hyperlipidemia LDL goal < 100   . Hypertension   . Left arm weakness    ambulates with cane   . Leg pain    worse with prolonged standing  . Peripheral vascular disease (HCC)   . Pyelonephritis 06/14/2017  . Sepsis (HCC) 06/14/2017  . Status post placement of implantable loop recorder   . Stroke [facility](H[facility]) 2018; 05/08/2017   denies residual from 2018 stroke on 05/08/2017; weakness on left; speech issues from today's stroke (05/08/2017)  . Varicose veins     Past Surgical History:  Past Surgical History:  Procedure Laterality Date  . FRACTURE SURGERY Left 2017   Shoulder  . LOOP RECORDER INSERTION N/A 05/09/2017   Procedure: LOOP RECORDER INSERTION;  Surgeon: Regan Lemmingamnitz, Will Martin, MD;  Location: [facility] INVASIVE CV LAB;  Service: Cardiovascular;  Laterality: N/A;  . REVERSE SHOULDER ARTHROPLASTY  Left 10/27/2014   Procedure: REVERSE SHOULDER ARTHROPLASTY;  Surgeon: Sheral Apleyimothy D Murphy, MD;  Location: [facility] OR;  Service: Orthopedics;  Laterality: Left;  . TEE WITHOUT CARDIOVERSION N/A 05/09/2017   Procedure: TRANSESOPHAGEAL ECHOCARDIOGRAM (TEE);  Surgeon: Jake BatheSkains, Mark C, MD;  [facility];  Service: Cardiovascular;  Laterality: N/A;  . TOTAL SHOULDER ARTHROPLASTY Left 10/27/2014   Procedure: TOTAL SHOULDER ARTHROPLASTY;  Surgeon: Sheral Apleyimothy D Murphy, MD;  Location: [facility] OR;  Service: Orthopedics;  Laterality: Left;  Marland Kitchen. VEIN SURGERY Right 1998?   right leg    Medication: Current Facility-Administered Medications  Medication Dose Route Frequency Provider Last Rate Last Dose  . 0.9 %  sodium chloride infusion   Intravenous Continuous Brayton ElBruning, Kevin, PA-C 75 mL/hr at 09/18/17 0824    . ceFAZolin (ANCEF) IVPB 2g/100 mL premix  2 g Intravenous Once Bruning, Kevin, PA-C      . ceFAZolin (ANCEF) IVPB 2g/100 mL premix  2 g Intravenous 30 min Pre-Op McKenzie, Mardene CelestePatrick L, MD        Allergies: No Known Allergies  Social History: Social History   Tobacco Use  . Smoking status: Never Smoker  . Smokeless tobacco: Never Used  Substance Use Topics  . Alcohol use: No  . Drug use: Never    Family History Family History  Problem Relation Age of Onset  . Cancer Brother   . Heart disease Sister     Review of Systems 10 systems were reviewed and are  negative except as noted specifically in the HPI.  Objective   Vital signs in last 24 hours: Ht 5\' 6"  (1.676 m)   Wt 90.7 kg (200 lb)   BMI 32.28 kg/m   Physical Exam General: NAD, A&O, resting, appropriate HEENT: [place]/AT, EOMI, MMM Pulmonary: Normal work of breathing Cardiovascular: HDS, adequate peripheral perfusion Abdomen: Soft, NTTP, nondistended GU: No CVA tenderness Extremities: warm and well perfused Neuro: Appropriate, no focal neurological deficits  Most Recent Labs: Lab Results  Component Value Date   WBC 10.2 09/14/2017    HGB 14.3 09/14/2017   HCT 45.0 09/14/2017   PLT 266 09/14/2017    Lab Results  Component Value Date   NA 139 09/14/2017   K 4.9 09/14/2017   CL 102 09/14/2017   CO2 27 09/14/2017   BUN 30 (H) 09/14/2017   CREATININE 1.04 (H) 09/14/2017   CALCIUM 9.4 09/14/2017    Lab Results  Component Value Date   INR 1.02 09/18/2017   APTT 20 (L) 09/18/2017     IMAGING: Ct Abdomen Pelvis Wo Contrast  Result Date: 09/18/2017 CLINICAL DATA:  Nephrolithiasis. Please evaluate stone burden prior to potential left-sided percutaneous nephrolithotomy. EXAM: CT ABDOMEN AND PELVIS WITHOUT CONTRAST TECHNIQUE: Multidetector CT imaging of the abdomen and pelvis was performed following the standard protocol without IV contrast. COMPARISON:  CT abdomen and pelvis - 07/17/2017; 06/14/2017 FINDINGS: The lack of intravenous contrast limits the ability to evaluate solid abdominal organs. Lower chest: Limited visualization of the lower thorax demonstrates minimal dependent subpleural ground-glass atelectasis. No discrete focal airspace opacities. No pleural effusion. Borderline cardiomegaly. No pericardial effusion. Hepatobiliary: Normal hepatic contour. No discrete hepatic lesions. Normal appearance of the gallbladder given degree distention. No radiopaque gallstones. No ascites. Pancreas: Noncontrast evaluation of the pancreas is normal. Spleen: Normal noncontrast appearance of the appendix. Adrenals/Urinary Tract: Punctate (approximately 0.5 x 0.5 cm) nonobstructing left-sided renal stone has migrated from the left renal pelvis and is now located within a nondilated inferior calyx (axial image 38, series 2; coronal image 61, series 5). No additional renal stones are identified. No renal stones are seen along expected course of either ureter or the urinary bladder. Normal noncontrast appearance of the urinary bladder given degree distention. Note is again made of a small left-sided extrarenal pelvis. Grossly unchanged  bilateral likely age and body habitus related perinephric stranding. Approximately 1.0 cm partially exophytic lesion arising from the posterior interpolar aspect of the left kidney (25, series 2), morphologically unchanged and previously characterized as a renal cyst. Additional right-sided subcentimeter hypoattenuating lesions are too small to accurately characterize though favored to represent additional renal cysts. Normal appearance of the bilateral adrenal glands. Stomach/Bowel: Rather extensive colonic diverticulosis without evidence of superimposed acute diverticulitis on this noncontrast examination. Normal noncontrast appearance of the terminal ileum and appendix. No pneumoperitoneum, pneumatosis or portal venous gas. Vascular/Lymphatic: Scattered calcified atherosclerotic plaque within a tortuous but normal caliber abdominal aorta. No bulky retroperitoneal, mesenteric, pelvic or inguinal lymphadenopathy. Reproductive: Normal noncontrast appearance of the pelvic organs. No free fluid the pelvic cul-de-sac. Other: Dystrophic calcifications are noted about the buttocks bilaterally. Scattered foci of subcutaneous emphysema within the lower abdominal pannus likely at the location of subcutaneous medication administration. Musculoskeletal: No acute or aggressive osseous abnormalities. Stigmata of DISH within the thoracic spine. Moderate bilateral facet degenerative change within the lower lumbar spine. Grade 1 anterolisthesis of L4 upon L5 without associated pars defects. IMPRESSION: 1. Solitary punctate (approximately 0.5 cm) nonobstructing left-sided renal stone has migrated from the  left renal pelvis and is now located within a nondilated left inferior calyx. 2. No evidence of right-sided nephrolithiasis. 3. Note is again made of a small left-sided extrarenal pelvis. No evidence of urinary obstruction. 4. Rather extensive colonic diverticulosis without evidence of diverticulitis. 5.  Aortic Atherosclerosis  (ICD10-I70.0). Electronically Signed   By: Simonne Come M.D.   On: 09/18/2017 10:10    ------  Assessment:  77 y.o. female with 5mm non-obstructing left renal stone.   Plan: - Proceed to OR as planned for left ureteroscopic stone extraction.

## 2017-09-18 NOTE — Consult Note (Signed)
Chief Complaint: Patient was seen in consultation today for left percutaneous nephrostomy/nephroureteral catheter placement  Referring Physician(s): [place]  Supervising Physician: Sandi Mariscal  Patient Status: [facility] - Out-pt TBA  History of Present Illness: Meghan Ford is a 77 y.o. female with history of 2.4 cm left renal pelvis stone who presents today for left percutaneous nephrostomy/nephroureteral catheter placement prior to planned nephrolithotomy.  Additional medical history as listed below.  Past Medical History:  Diagnosis Date  . Arthritis    bil knees  . Blurred vision, left eye   . Blurry vision, bilateral   . Diabetes mellitus type II, uncontrolled ([place])   . Diabetes mellitus without complication ([place])   . Dysrhythmia    patient via intepreter reports at time of last stroke , her heartbeat was very irregular ; HR WDL today, patient denies any headache, chest pain, new numbness or new worsening vision since last stroke   . Fx humeral neck   . Gait disturbance   . History of kidney stones   . Hyperlipidemia LDL goal < 100   . Hypertension   . Left arm weakness    ambulates with cane   . Leg pain    worse with prolonged standing  . Peripheral vascular disease ([place])   . Pyelonephritis 06/14/2017  . Sepsis ([place]) 06/14/2017  . Status post placement of implantable loop recorder   . Stroke [facility]) 2018; 05/08/2017   denies residual from 2018 stroke on 05/08/2017; weakness on left; speech issues from today's stroke (05/08/2017)  . Varicose veins     Past Surgical History:  Procedure Laterality Date  . FRACTURE SURGERY Left 2017   Shoulder  . LOOP RECORDER INSERTION N/A 05/09/2017   Procedure: LOOP RECORDER INSERTION;  Surgeon: Constance Haw, MD;  Location: [place] [facility];  Service: Cardiovascular;  Laterality: N/A;  . REVERSE SHOULDER ARTHROPLASTY Left 10/27/2014   Procedure: REVERSE SHOULDER ARTHROPLASTY;  Surgeon: Renette Butters, MD;   Location: [place];  Service: Orthopedics;  Laterality: Left;  . TEE WITHOUT CARDIOVERSION N/A 05/09/2017   Procedure: TRANSESOPHAGEAL ECHOCARDIOGRAM (TEE);  Surgeon: Jerline Pain, MD;  Location: [facility] ENDOSCOPY;  Service: Cardiovascular;  Laterality: N/A;  . TOTAL SHOULDER ARTHROPLASTY Left 10/27/2014   Procedure: TOTAL SHOULDER ARTHROPLASTY;  Surgeon: Renette Butters, MD;  Location: [place];  Service: Orthopedics;  Laterality: Left;  Marland Kitchen VEIN SURGERY Right 1998?   right leg    Allergies: Patient has no known allergies.  Medications: Prior to Admission medications   Medication Sig Start Date End Date Taking? Authorizing Provider  amLODipine (NORVASC) 10 MG tablet Take 1 tablet (10 mg total) by mouth daily. 08/30/17  Yes Argentina Donovan, PA-C  atorvastatin (LIPITOR) 80 MG tablet Take 1 tablet (80 mg total) by mouth daily. 08/30/17  Yes Freeman Caldron M, PA-C  blood glucose meter kit and supplies KIT Dispense based on patient and insurance preference. Use up to four times daily as directed. (FOR ICD-9 250.00, 250.01). 11/09/16  Yes Hosie Poisson, MD  Blood Glucose Monitoring Suppl (TRUE METRIX METER) DEVI 1 each by Does not apply route 3 (three) times daily before meals. 05/14/17  Yes Charlott Rakes, MD  gabapentin (NEURONTIN) 300 MG capsule Take 1 capsule (300 mg total) by mouth at bedtime. 08/30/17  Yes Freeman Caldron M, PA-C  glucose blood (TRUE METRIX BLOOD GLUCOSE TEST) test strip Use 3 times daily before meals 05/14/17  Yes Newlin, Enobong, MD  glucose blood test strip Use as instructed 11/09/16  Yes Hosie Poisson, MD  insulin glargine (LANTUS) 100 UNIT/ML injection Inject 0.35 mLs (35 Units total) into the skin 2 (two) times daily. 08/09/17  Yes Charlott Rakes, MD  Insulin Syringes, Disposable, U-100 0.3 ML MISC USE AS DIRECTED 08/14/17  Yes Newlin, Enobong, MD  isosorbide mononitrate (IMDUR) 60 MG 24 hr tablet Take 1 tablet (60 mg total) by mouth daily. 05/14/17  Yes Charlott Rakes, MD  lisinopril  (PRINIVIL,ZESTRIL) 10 MG tablet Take 1 tablet (10 mg total) by mouth daily. 08/30/17 08/30/18 Yes McClung, Dionne Bucy, PA-C  traMADol (ULTRAM) 50 MG tablet Take 1 tablet (50 mg total) by mouth every 8 (eight) hours as needed. 08/30/17  Yes McClung, Dionne Bucy, PA-C  TRUEPLUS LANCETS 28G MISC 1 each by Does not apply route 3 (three) times daily before meals. 05/14/17  Yes Charlott Rakes, MD  aspirin 81 MG tablet Take 4 tablets (325 mg total) by mouth daily. 06/26/17   Colbert Ewing, MD  clopidogrel (PLAVIX) 75 MG tablet Take 1 tablet (75 mg total) by mouth daily. 08/14/17   Charlott Rakes, MD  senna-docusate (SENOKOT-S) 8.6-50 MG tablet Take 1 tablet by mouth at bedtime as needed for mild constipation. 05/09/17   Aline August, MD     Family History  Problem Relation Age of Onset  . Cancer Brother   . Heart disease Sister     Social History   Socioeconomic History  . Marital status: Single    Spouse name: Not on file  . Number of children: Not on file  . Years of education: Not on file  . Highest education level: Not on file  Occupational History  . Not on file  Social Needs  . Financial resource strain: Not on file  . Food insecurity:    Worry: Not on file    Inability: Not on file  . Transportation needs:    Medical: Not on file    Non-medical: Not on file  Tobacco Use  . Smoking status: Never Smoker  . Smokeless tobacco: Never Used  Substance and Sexual Activity  . Alcohol use: No  . Drug use: Never  . Sexual activity: Not on file  Lifestyle  . Physical activity:    Days per week: Not on file    Minutes per session: Not on file  . Stress: Not on file  Relationships  . Social connections:    Talks on phone: Not on file    Gets together: Not on file    Attends religious service: Not on file    Active member of club or organization: Not on file    Attends meetings of clubs or organizations: Not on file    Relationship status: Not on file  Other Topics Concern  . Not on  file  Social History Narrative   ** Merged History Encounter **       Lives with son [name] who comes with her to majority of visits and helps coordinate care for her. He translates for her and has signed and patient signed a release for this on 02/02/12.             Review of Systems currently denies fever, headache, chest pain, dyspnea, cough, abdominal/back pain, nausea, vomiting or bleeding.  Does have blurry vision and left arm weakness following prior stroke  Vital Signs: Ht _0  (1.676 m)   Wt 200 lb (90.7 kg)   BMI 32.28 kg/m  blood pressure 160/68, heart rate 63, temp 99, respirations 16, O2  sat 99% room air  Physical Exam awake, alert.  Chest clear to auscultation bilaterally.  Heart with regular rate and rhythm.  Abdomen soft, positive bowel sounds, nontender.  No lower extremity edema.  Imaging: No results found.  Labs:  CBC: Recent Labs    06/19/17 0418 07/17/17 0627 08/09/17 1226 09/14/17 1200  WBC 7.0 7.3 9.6 10.2  HGB 11.7* 11.9* 13.2 14.3  HCT 38.2 38.9 42.1 45.0  PLT 283 224 279 266    COAGS: Recent Labs    11/06/16 2050 05/07/17 1545 05/09/17 0744  INR 1.00 1.02 1.04  APTT 29 31  --     BMP: Recent Labs    06/26/17 1629 07/17/17 0627 08/09/17 1226 09/14/17 1200  NA 142 139 141 139  K 4.8 4.0 4.5 4.9  CL 102 105 106 102  CO2 _0 GLUCOSE 116* 145* 131* 179*  BUN 19 23* 25* 30*  CALCIUM 9.4 8.7* 9.5 9.4  CREATININE 0.81 0.95 0.82 1.04*  GFRNONAA 72 58* >60 51*  GFRAA 83 >60 >60 59*    LIVER FUNCTION TESTS: Recent Labs    05/07/17 1545 06/14/17 0726 06/16/17 0843 07/17/17 0627  BILITOT 0.6 0.7 0.9 0.8  AST 21 35 31 19  ALT _1 13*  ALKPHOS 92 80 75 68  PROT 7.2 6.6 6.6 6.4*  ALBUMIN 3.0* 3.1* 2.7* 3.4*    TUMOR MARKERS: No results for input(s): AFPTM, CEA, CA199, CHROMGRNA in the last 8760 hours.  Assessment and Plan: 77 y.o. female with history of 2.4 cm left renal pelvis stone who presents  today for left percutaneous nephrostomy/nephroureteral catheter placement prior to planned nephrolithotomy. Risks and benefits of procedure were discussed with the patient via family interpreter  including, but not limited to, infection, bleeding, significant bleeding causing loss or decrease in renal function or damage to adjacent structures.   All of the patient's questions were answered, patient is agreeable to proceed.  Consent signed and in chart.      Thank you for this interesting consult.  I greatly enjoyed meeting Meghan Ford and look forward to participating in their care.  A copy of this report was sent to the requesting provider on this date.  Electronically Signed: D. Rowe Robert, PA-C 09/18/2017, 8:28 AM   I spent a total of     in face to face in clinical consultation, greater than 50% of which was counseling/coordinating care for left percutaneous nephrostomy/nephroureteral catheter placement

## 2017-09-18 NOTE — Op Note (Signed)
Preoperative Diagnosis: Left nephrolithiasis  Postoperative Diagnosis:  Same  Procedure(s) Performed:   - Cystourethroscopy - Left ureteroscopic stone extraction with laser lithotripsy - Left retrograde pyelogram - Left ureteral stent placement - Intraoperative fluoroscopy with interpretation <1hr.   Teaching Surgeon:  Wilkie Aye, MD  Resident Surgeon:  Case Geri Seminole, MD  Assistant(s):  None  Anesthesia:  General  Fluids:  See anesthesia record  Estimated blood loss:  0cc  Specimens:  Stone for analysis  Drains:  Left 6Fr x 24cm JJ ureteral stent without dangler  Complications:  None  Indications: 77 y.o. patient with a history of nephrolithiasis who presents today for left USE. Risks & benefits of the procedure discussed with the patient, who wishes to proceed.  Findings:  ~54mm left lower pole stone, successfully fragmentedand basket extracted  Radiologic Interpretation of Retrograde Pyelogram: Left retrograde pyelogram demonstrated no contrast extravasation, filling defects or evidence of hydroureteronephrosis.  Description:  The patient was correctly identified in the preop holding area where written informed consent as well potential risk and complication reviewed. The patient agreed. They were brought back to the operative suite where a preinduction timeout was performed. Once correct information was verified, general anesthesia was induced. They were then gently placed into dorsal lithotomy position with SCDs in place for VTE prophylaxis. They were prepped and draped in the usual sterile fashion and given appropriate preoperative antibiotics. A second timeout was then performed.   We inserted a 23F rigid cystoscope per urethra with copious lubrication and normal saline irrigation running. This demonstrated findings as described above.   We cannulated the left ureteral orifice with the combination of a sensor wire and 5Fr open ended catheter and the sensor wire was  advanced into the expected location of the renal pelvis without difficulty. The 5Fr open ended catheter was then advanced over the sensor wire into the distal ureter and the sensor wire was removed. A retrograde pyelogram was performed with findings as noted above. We then replaced the sensor wire into the right kidney and the 5Fr open ended catheter was removed. We then removed the cystoscope leaving our sensor wire in place.  We then obtained a 12/14Fr x 35cm ureteral access sheath which was easily passed over the sensor wire into the proximal ureter just distal to the UPJ under fluoroscopic guidance. The inner cannula was removed and was replaced by the flexible ureteroscope and ureteroscopy was performed. This demonstrated a ~16mm stone in the lower pole calyx.   Wethen obtained a 200 micron holmium laser fiber and performed laser lithotripsy until all of the stone burden was fragmented into several small fragments which were basket extracted using a zero-tip nitinol wire basket without complication. Stone fragments were sent for chemical analysis. We then re-explored the location of stone treatment which demonstrated no significant remaining stone burden. At this point we elected to leave a ureteral stent and withdrew our instruments leaving a sensor wire in place. We then advanced a 6Fr x 24cm JJ ureteral stent without dangler with the assistance of a stent pusher under direct fluoroscopic & visual guidance without difficultly. Sensor wire removal demonstrated satisfactory stent curl proximally in the renal pelvis and distally in the bladder. The bladder was emptied and all instrumentation was removed. The patient was woken up from anesthesia and taken to the recovery unit for routine postoperative care.  Post Op Plan:   1. Follow up 1 week for cysto stent removal 2. Follow up 6-8 weeks with RUS prior  Attestation:  Dr. Ronne BinningMcKenzie was present for the entire procedure.

## 2017-09-18 NOTE — Anesthesia Preprocedure Evaluation (Signed)
Anesthesia Evaluation  Patient identified by MRN, date of birth, ID band Patient awake    Reviewed: Allergy & Precautions, NPO status , Patient's Chart, lab work & pertinent test results  History of Anesthesia Complications Negative for: history of anesthetic complications  Airway Mallampati: II  TM Distance: >3 FB Neck ROM: Full    Dental  (+) Edentulous Upper   Pulmonary neg pulmonary ROS,    breath sounds clear to auscultation       Cardiovascular hypertension, + Peripheral Vascular Disease   Rhythm:Regular Rate:Normal     Neuro/Psych  Headaches,    GI/Hepatic   Endo/Other  diabetesMorbid obesity  Renal/GU      Musculoskeletal   Abdominal (+) + obese,   Peds  Hematology negative hematology ROS (+)   Anesthesia Other Findings   Reproductive/Obstetrics                             Anesthesia Physical  Anesthesia Plan  ASA: III  Anesthesia Plan: General   Post-op Pain Management: GA combined w/ Regional for post-op pain   Induction: Intravenous  PONV Risk Score and Plan: 3 and Ondansetron, Dexamethasone and Midazolam  Airway Management Planned: LMA  Additional Equipment:   Intra-op Plan:   Post-operative Plan: Extubation in OR  Informed Consent: I have reviewed the patients History and Physical, chart, labs and discussed the procedure including the risks, benefits and alternatives for the proposed anesthesia with the patient or authorized representative who has indicated his/her understanding and acceptance.   Dental advisory given  Plan Discussed with: CRNA and Surgeon  Anesthesia Plan Comments:         Anesthesia Quick Evaluation

## 2017-09-19 ENCOUNTER — Encounter: Payer: Self-pay | Admitting: Cardiology

## 2017-09-19 ENCOUNTER — Encounter (HOSPITAL_COMMUNITY): Payer: Self-pay | Admitting: Urology

## 2017-09-19 LAB — CUP PACEART REMOTE DEVICE CHECK
MDC IDC PG IMPLANT DT: 20190227
MDC IDC SESS DTM: 20190604001001

## 2017-10-02 ENCOUNTER — Encounter: Payer: Self-pay | Admitting: Cardiology

## 2017-10-15 MED FILL — SULFAMETHOXAZOLE-TMP DS TAB: 800-160 | 7 days supply | Qty: 14 | Fill #0

## 2017-10-16 MED FILL — GABAPENTIN 300 MG CAPSULE: 300 | 30 days supply | Qty: 30 | Fill #4

## 2017-10-16 MED FILL — LISINOPRIL 10 MG TABS: 10 | 30 days supply | Qty: 30 | Fill #1

## 2017-10-18 ENCOUNTER — Encounter: Payer: Self-pay | Admitting: *Deleted

## 2017-10-20 LAB — CUP PACEART REMOTE DEVICE CHECK
Date Time Interrogation Session: 20190707003718
Implantable Pulse Generator Implant Date: 20190227

## 2017-10-23 ENCOUNTER — Encounter: Payer: Self-pay | Admitting: Cardiology

## 2017-10-28 NOTE — Progress Notes (Deleted)
NEUROLOGY CONSULTATION NOTE  [name] MRN: [id number] DOB: April 05, 1940  Referring provider: Charlott Rakes, MD Primary care provider:Enobong Margarita Rana, MD  Reason for consult:  stroke  HISTORY OF PRESENT ILLNESS: Meghan Ford is a 77 year old ***-handed female with hypertension, hyperlipidemia, type 2 diabetes mellitus, and peripheral vascular disease who presents for stroke.  She was admitted to [facility] on 05/07/17 for left sided weakness and left visual field cut.  MRI of brain was personally reviewed and demonstrated early subacute infarct in right PCA distribution right posterior hemisphere watershed.  MRA of head showed right proximal PCA occlusion with poor right PCA distribution collateralization.  CTA of head and neck showed severely atherosclerotic diseased proximal right PCA.  Echocardiogram showed EF 65-70% with grade 1 diastolic dysfunction.  TEE demonstrated no cardiac source of emboli.  LDL was 58.  Hgb A1c was 12.7.  Bilateral carotid doppler from prior TIA in August 2018 showed no hemodynamically significant stenosis. Loop recorder was implanted.  She was discharged on dual antiplatelet therapy (ASA '325mg'$  and Plavix) and Lipitor '80mg'$  daily.    ***.  09/14/17 HGB A1c 7.4  PAST MEDICAL HISTORY: Past Medical History:  Diagnosis Date  . Arthritis    bil knees  . Blurred vision, left eye   . Blurry vision, bilateral   . Diabetes mellitus type II, uncontrolled ([place])   . Diabetes mellitus without complication ([place])   . Dysrhythmia    patient via intepreter reports at time of last stroke , her heartbeat was very irregular ; HR WDL today, patient denies any headache, chest pain, new numbness or new worsening vision since last stroke   . Fx humeral neck   . Gait disturbance   . History of kidney stones   . Hyperlipidemia LDL goal < 100   . Hypertension   . Left arm weakness    ambulates with cane   . Leg pain    worse with prolonged  standing  . Peripheral vascular disease ([place])   . Pyelonephritis 06/14/2017  . Sepsis ([place]) 06/14/2017  . Status post placement of implantable loop recorder   . Stroke [facility]) 2018; 05/08/2017   denies residual from 2018 stroke on 05/08/2017; weakness on left; speech issues from today's stroke (05/08/2017)  . Varicose veins     PAST SURGICAL HISTORY: Past Surgical History:  Procedure Laterality Date  . FRACTURE SURGERY Left 2017   Shoulder  . HOLMIUM LASER APPLICATION Left 04/16/5359   Procedure: HOLMIUM LASER APPLICATION;  Surgeon: Cleon Gustin, MD;  Location: [facility];  Service: Urology;  Laterality: Left;  . LOOP RECORDER INSERTION N/A 05/09/2017   Procedure: LOOP RECORDER INSERTION;  Surgeon: Constance Haw, MD;  Location: [place] CV LAB;  Service: Cardiovascular;  Laterality: N/A;  . NEPHROLITHOTOMY Left 09/18/2017   Procedure: CYSTOSCOPY/LEFT URETEROSCOPIC STONE EXTRACTION/LEFT RETROGRADE/ STENT PLACEMENT;  Surgeon: Cleon Gustin, MD;  Location: [facility];  Service: Urology;  Laterality: Left;  . REVERSE SHOULDER ARTHROPLASTY Left 10/27/2014   Procedure: REVERSE SHOULDER ARTHROPLASTY;  Surgeon: Renette Butters, MD;  Location: [place];  Service: Orthopedics;  Laterality: Left;  . TEE WITHOUT CARDIOVERSION N/A 05/09/2017   Procedure: TRANSESOPHAGEAL ECHOCARDIOGRAM (TEE);  Surgeon: Jerline Pain, MD;  Location: [facility] ENDOSCOPY;  Service: Cardiovascular;  Laterality: N/A;  . TOTAL SHOULDER ARTHROPLASTY Left 10/27/2014   Procedure: TOTAL SHOULDER ARTHROPLASTY;  Surgeon: Renette Butters, MD;  Location: [place];  Service: Orthopedics;  Laterality: Left;  Marland Kitchen VEIN SURGERY Right 1998?  right leg    MEDICATIONS: Current Outpatient Medications on File Prior to Visit  Medication Sig Dispense Refill  . amLODipine (NORVASC) 10 MG tablet Take 1 tablet (10 mg total) by mouth daily. 30 tablet 5  . aspirin 81 MG tablet Take 4 tablets (325 mg total) by mouth daily. 30 tablet 11  .  atorvastatin (LIPITOR) 80 MG tablet Take 1 tablet (80 mg total) by mouth daily. 30 tablet 5  . blood glucose meter kit and supplies KIT Dispense based on patient and insurance preference. Use up to four times daily as directed. (FOR ICD-9 250.00, 250.01). 1 each 0  . Blood Glucose Monitoring Suppl (TRUE METRIX METER) DEVI 1 each by Does not apply route 3 (three) times daily before meals. 1 Device 0  . clopidogrel (PLAVIX) 75 MG tablet Take 1 tablet (75 mg total) by mouth daily. 30 tablet 1  . gabapentin (NEURONTIN) 300 MG capsule Take 1 capsule (300 mg total) by mouth at bedtime. 30 capsule 5  . glucose blood (TRUE METRIX BLOOD GLUCOSE TEST) test strip Use 3 times daily before meals 100 each 12  . glucose blood test strip Use as instructed 100 each 12  . insulin glargine (LANTUS) 100 UNIT/ML injection Inject 0.35 mLs (35 Units total) into the skin 2 (two) times daily. 30 mL 11  . Insulin Syringes, Disposable, U-100 0.3 ML MISC USE AS DIRECTED 100 each 11  . isosorbide mononitrate (IMDUR) 60 MG 24 hr tablet Take 1 tablet (60 mg total) by mouth daily. 30 tablet 5  . lisinopril (PRINIVIL,ZESTRIL) 10 MG tablet Take 1 tablet (10 mg total) by mouth daily. 30 tablet 5  . ondansetron (ZOFRAN ODT) 4 MG disintegrating tablet Take 1 tablet (4 mg total) by mouth every 8 (eight) hours as needed for nausea or vomiting. 20 tablet 0  . senna-docusate (SENOKOT-S) 8.6-50 MG tablet Take 1 tablet by mouth at bedtime as needed for mild constipation. 10 tablet 0  . tamsulosin (FLOMAX) 0.4 MG CAPS capsule Take 1 capsule (0.4 mg total) by mouth at bedtime. 14 capsule 0  . traMADol (ULTRAM) 50 MG tablet Take 1 tablet (50 mg total) by mouth every 6 (six) hours as needed. 20 tablet 0  . TRUEPLUS LANCETS 28G MISC 1 each by Does not apply route 3 (three) times daily before meals. 100 each 12   No current facility-administered medications on file prior to visit.     ALLERGIES: No Known Allergies  FAMILY HISTORY: Family  History  Problem Relation Age of Onset  . Cancer Brother   . Heart disease Sister     SOCIAL HISTORY: Social History   Socioeconomic History  . Marital status: Single    Spouse name: Not on file  . Number of children: Not on file  . Years of education: Not on file  . Highest education level: Not on file  Occupational History  . Not on file  Social Needs  . Financial resource strain: Not on file  . Food insecurity:    Worry: Not on file    Inability: Not on file  . Transportation needs:    Medical: Not on file    Non-medical: Not on file  Tobacco Use  . Smoking status: Never Smoker  . Smokeless tobacco: Never Used  Substance and Sexual Activity  . Alcohol use: No  . Drug use: Never  . Sexual activity: Not on file  Lifestyle  . Physical activity:    Days per week: Not on file  Minutes per session: Not on file  . Stress: Not on file  Relationships  . Social connections:    Talks on phone: Not on file    Gets together: Not on file    Attends religious service: Not on file    Active member of club or organization: Not on file    Attends meetings of clubs or organizations: Not on file    Relationship status: Not on file  . Intimate partner violence:    Fear of current or ex partner: Not on file    Emotionally abused: Not on file    Physically abused: Not on file    Forced sexual activity: Not on file  Other Topics Concern  . Not on file  Social History Narrative   ** Merged History Encounter **       Lives with son [name] who comes with her to majority of visits and helps coordinate care for her. He translates for her and has signed and patient signed a release for this on 02/02/12.           REVIEW OF SYSTEMS: Constitutional: No fevers, chills, or sweats, no generalized fatigue, change in appetite Eyes: No visual changes, double vision, eye pain Ear, nose and throat: No hearing loss, ear pain, nasal congestion, sore throat Cardiovascular: No chest  pain, palpitations Respiratory:  No shortness of breath at rest or with exertion, wheezes GastrointestinaI: No nausea, vomiting, diarrhea, abdominal pain, fecal incontinence Genitourinary:  No dysuria, urinary retention or frequency Musculoskeletal:  No neck pain, back pain Integumentary: No rash, pruritus, skin lesions Neurological: as above Psychiatric: No depression, insomnia, anxiety Endocrine: No palpitations, fatigue, diaphoresis, mood swings, change in appetite, change in weight, increased thirst Hematologic/Lymphatic:  No purpura, petechiae. Allergic/Immunologic: no itchy/runny eyes, nasal congestion, recent allergic reactions, rashes  PHYSICAL EXAM: *** General: No acute distress.  Patient appears ***-groomed.  *** Head:  Normocephalic/atraumatic Eyes:  fundi examined but not visualized Neck: supple, no paraspinal tenderness, full range of motion Back: No paraspinal tenderness Heart: regular rate and rhythm Lungs: Clear to auscultation bilaterally. Vascular: No carotid bruits. Neurological Exam: Mental status: alert and oriented to person, place, and time, recent and remote memory intact, fund of knowledge intact, attention and concentration intact, speech fluent and not dysarthric, language intact. Cranial nerves: CN I: not tested CN II: pupils equal, round and reactive to light, visual fields intact CN III, IV, VI:  full range of motion, no nystagmus, no ptosis CN V: facial sensation intact CN VII: upper and lower face symmetric CN VIII: hearing intact CN IX, X: gag intact, uvula midline CN XI: sternocleidomastoid and trapezius muscles intact CN XII: tongue midline Bulk & Tone: normal, no fasciculations. Motor:  5/5 throughout *** Sensation:  Pinprick *** temperature *** and vibration sensation intact.  ***. Deep Tendon Reflexes:  2+ throughout, *** toes downgoing.  *** Finger to nose testing:  Without dysmetria.  *** Heel to shin:  Without dysmetria.  *** Gait:   Normal station and stride.  Able to turn and tandem walk. Romberg ***.  IMPRESSION: 1.  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 2.  Hypertension 3.  Hyperlipidemia 4.  Type 2 diabetes mellitus  PLAN: 1.  Discontinue ASA and continue Plavix '75mg'$  daily alone for secondary stroke prevention. 2.  Continue Lipitor '80mg'$  daily (LDL goal less than 70 3.  Continue to optimize glycemic control (HGB A1c goal less than 7.0) 4.  Blood pressure  control 5.  Mediterranean diet  Thank you for allowing me to take part in the care of this patient.  Metta Clines, DO  CC: ***

## 2017-10-29 ENCOUNTER — Ambulatory Visit: Payer: Self-pay | Admitting: Neurology

## 2017-10-29 ENCOUNTER — Telehealth: Payer: Self-pay | Admitting: *Deleted

## 2017-10-29 NOTE — Telephone Encounter (Signed)
LMOVM for patient's son (DPR) regarding sending manual transmission to review 12 "AF" episodes. Gave [facility] phone number to call if questions or concerns.

## 2017-11-06 NOTE — Telephone Encounter (Signed)
LMOVM for Alecia LemmingVictor requesting manual Carelink transmission for review. Gave DC phone number for questions/concerns.

## 2017-11-13 ENCOUNTER — Telehealth: Payer: Self-pay | Admitting: Cardiology

## 2017-11-13 NOTE — Telephone Encounter (Signed)
LMOVM requesting that pt send manual transmission b/c home monitor has not updated in at least 14 days.    

## 2017-11-15 NOTE — Telephone Encounter (Signed)
Manual Carelink transmission received on 11/14/17. All available "AF" episodes appear false, ECGs show SR w/PACs. ECGs printed and placed in Dr. Mickey Farber folder for review.

## 2017-11-20 ENCOUNTER — Ambulatory Visit: Payer: Self-pay | Admitting: Family Medicine

## 2017-11-21 ENCOUNTER — Ambulatory Visit (INDEPENDENT_AMBULATORY_CARE_PROVIDER_SITE_OTHER): Payer: Self-pay | Admitting: *Deleted

## 2017-11-21 DIAGNOSIS — I639 Cerebral infarction, unspecified: Secondary | ICD-10-CM

## 2017-11-21 NOTE — Progress Notes (Signed)
Carelink Summary Report / Loop Recorder 

## 2017-12-07 LAB — CUP PACEART REMOTE DEVICE CHECK
Date Time Interrogation Session: 20190911013548
MDC IDC PG IMPLANT DT: 20190227

## 2017-12-13 ENCOUNTER — Ambulatory Visit: Payer: Self-pay | Attending: Family Medicine | Admitting: Family Medicine

## 2017-12-13 ENCOUNTER — Encounter: Payer: Self-pay | Admitting: Family Medicine

## 2017-12-13 VITALS — BP 194/74 | HR 66 | Temp 97.8°F | Ht 64.0 in | Wt 202.8 lb

## 2017-12-13 DIAGNOSIS — Z7982 Long term (current) use of aspirin: Secondary | ICD-10-CM | POA: Insufficient documentation

## 2017-12-13 DIAGNOSIS — I6340 Cerebral infarction due to embolism of unspecified cerebral artery: Secondary | ICD-10-CM | POA: Insufficient documentation

## 2017-12-13 DIAGNOSIS — Z794 Long term (current) use of insulin: Secondary | ICD-10-CM | POA: Insufficient documentation

## 2017-12-13 DIAGNOSIS — E1165 Type 2 diabetes mellitus with hyperglycemia: Secondary | ICD-10-CM

## 2017-12-13 DIAGNOSIS — B079 Viral wart, unspecified: Secondary | ICD-10-CM | POA: Insufficient documentation

## 2017-12-13 DIAGNOSIS — Z79899 Other long term (current) drug therapy: Secondary | ICD-10-CM | POA: Insufficient documentation

## 2017-12-13 DIAGNOSIS — E1140 Type 2 diabetes mellitus with diabetic neuropathy, unspecified: Secondary | ICD-10-CM | POA: Insufficient documentation

## 2017-12-13 DIAGNOSIS — E7800 Pure hypercholesterolemia, unspecified: Secondary | ICD-10-CM | POA: Insufficient documentation

## 2017-12-13 DIAGNOSIS — M7502 Adhesive capsulitis of left shoulder: Secondary | ICD-10-CM | POA: Insufficient documentation

## 2017-12-13 DIAGNOSIS — M5431 Sciatica, right side: Secondary | ICD-10-CM | POA: Insufficient documentation

## 2017-12-13 DIAGNOSIS — Z9889 Other specified postprocedural states: Secondary | ICD-10-CM | POA: Insufficient documentation

## 2017-12-13 DIAGNOSIS — E1169 Type 2 diabetes mellitus with other specified complication: Secondary | ICD-10-CM

## 2017-12-13 DIAGNOSIS — I10 Essential (primary) hypertension: Secondary | ICD-10-CM | POA: Insufficient documentation

## 2017-12-13 DIAGNOSIS — Z8673 Personal history of transient ischemic attack (TIA), and cerebral infarction without residual deficits: Secondary | ICD-10-CM | POA: Insufficient documentation

## 2017-12-13 LAB — POCT GLYCOSYLATED HEMOGLOBIN (HGB A1C): HEMOGLOBIN A1C: 7.2 % — AB (ref 4.0–5.6)

## 2017-12-13 LAB — GLUCOSE, POCT (MANUAL RESULT ENTRY): POC Glucose: 152 mg/dl — AB (ref 70–99)

## 2017-12-13 MED ORDER — ISOSORBIDE MONONITRATE ER 60 MG PO TB24: 60.0000 mg | ORAL_TABLET | Freq: Every day | ORAL | 5 refills | Status: DC

## 2017-12-13 MED ORDER — INSULIN GLARGINE 100 UNIT/ML ~~LOC~~ SOLN: 35.0000 [IU] | Freq: Two times a day (BID) | SUBCUTANEOUS | 6 refills | Status: DC

## 2017-12-13 MED ORDER — AMLODIPINE BESYLATE 10 MG PO TABS: 10.0000 mg | ORAL_TABLET | Freq: Every day | ORAL | 5 refills | Status: DC

## 2017-12-13 MED ORDER — CLOPIDOGREL BISULFATE 75 MG PO TABS: 75.0000 mg | ORAL_TABLET | Freq: Every day | ORAL | 5 refills | Status: DC

## 2017-12-13 MED ORDER — ATORVASTATIN CALCIUM 80 MG PO TABS: 80.0000 mg | ORAL_TABLET | Freq: Every day | ORAL | 5 refills | Status: DC

## 2017-12-13 MED ORDER — LISINOPRIL 10 MG PO TABS: 10.0000 mg | ORAL_TABLET | Freq: Every day | ORAL | 5 refills | Status: DC

## 2017-12-13 MED ORDER — GABAPENTIN 300 MG PO CAPS: 300.0000 mg | ORAL_CAPSULE | Freq: Every day | ORAL | 5 refills | Status: DC

## 2017-12-13 MED FILL — CLOPIDOGREL 75 MG TABLET: 75 | 30 days supply | Qty: 30 | Fill #0

## 2017-12-13 MED FILL — ATORVASTATIN 80 MG TABLET: 80 | 30 days supply | Qty: 30 | Fill #0

## 2017-12-13 MED FILL — GABAPENTIN 300 MG CAPSULE: 300 | 30 days supply | Qty: 30 | Fill #0

## 2017-12-13 MED FILL — !LANTUS 100 UNITS/ML VIAL: 100 | 28 days supply | Qty: 20 | Fill #0

## 2017-12-13 MED FILL — AMLODIPINE BESYLATE 10 MG T: 10 | 30 days supply | Qty: 30 | Fill #0

## 2017-12-13 MED FILL — LISINOPRIL 10 MG TABS: 10 | 30 days supply | Qty: 30 | Fill #0

## 2017-12-13 MED FILL — ISOSORBIDE MN ER 60 MG TAB: 60 | 30 days supply | Qty: 30 | Fill #0

## 2017-12-13 NOTE — Progress Notes (Signed)
Pain in right hip down leg.  Sharp pains in upper back.

## 2017-12-13 NOTE — Patient Instructions (Signed)
Ciática  Sciatica  La ciática es el dolor, entumecimiento, debilidad u hormigueo a lo largo del nervio ciático. El nervio ciático comienza en la parte inferior de la espalda y desciende por la parte posterior de cada pierna. Controla los músculos en la parte inferior de las piernas y en la parte posterior de las rodillas. También proporciona sensibilidad a la parte posterior de los muslos, la parte inferior de las piernas y la planta de los pies. La ciática es un síntoma de otra afección que ejerce presión o “pellizca” el nervio ciático.  Generalmente la ciática afecta solo a un lado del cuerpo. Suele desaparecer por sí sola o con tratamiento. En algunos casos, la ciática puede volver a aparecer (ser recurrente).  ¿Cuáles son las causas?  Esta afección causa presión sobre el nervio ciático o lo “pellizca”. Esto puede ser el resultado de:  · Un disco que sobresale demasiado (hernia de disco) entre los huesos de la columna vertebral (vértebras).  · Cambios relacionados con la edad en los discos de la columna vertebral (discopatía degenerativa).  · Un trastorno doloroso que afecta un músculo de las nalgas (síndrome piriforme).  · Un crecimiento óseo adicional (espolón óseo) cerca del nervio ciático.  · Una lesión o fractura de la pelvis.  · Embarazo.  · Tumor (poco frecuente).    ¿Qué incrementa el riesgo?  Los siguientes factores pueden hacer que usted sea más propenso a tener esta enfermedad:  · Practicar deportes en los que se ejerce presión sobre la columna vertebral o en los que la columna realiza mucho esfuerzo, como el fútbol americano o el levantamiento de pesas.  · Tener poca fuerza y flexibilidad.  · Antecedentes médicos de lesiones en la espalda.  · Antecedentes médicos de cirugía en la espalda.  · Estar sentado durante largos períodos de tiempo.  · Realizar actividades que requieren agacharse o levantar objetos en forma repetida.  · Obesidad.    ¿Cuáles son los signos o los síntomas?  Los síntomas pueden  ser leves o graves, y pueden incluir los siguientes:  · Cualquiera de los siguientes problemas en la parte inferior de la espalda, piernas, cadera o nalgas:  ? Hormigueo leve o dolor sordo.  ? Sensación de ardor.  ? Dolor agudo.  · Adormecimiento de la parte posterior de la pantorrilla o la planta del pie.  · Debilidad en las piernas.  · Dolor de espalda intenso que dificulta el movimiento.    Estos síntomas podrían empeorar al toser, estornudar o reírse, o cuando se está sentado o de pie durante períodos prolongados. El sobrepeso también puede empeorar los síntomas. En algunos casos, los síntomas regresan luego de un tiempo.  ¿Cómo se diagnostica?  Esta afección se puede diagnosticar en función de lo siguiente:  · Sus síntomas.  · Un examen físico. El médico podría indicarle que realice ciertos movimientos para controlar si estos desencadenan los síntomas.  · También pueden hacerle exámenes que incluyen lo siguiente:  ? Análisis de sangre.  ? Radiografías.  ? Resonancia magnética (RM).  ? Exploración por tomografía computarizada (TC).    ¿Cómo se trata?  En muchos casos, esta afección mejora por sí sola, sin ningún tratamiento. Sin embargo, el tratamiento puede incluir:  · Reducción o modificación de la actividad física en los períodos de dolor.  · Ejercicios y estiramiento para fortalecer el abdomen y mejorar la flexibilidad de la columna vertebral.  · Aplicación de calor o hielo en la zona afectada.  · Medicamentos para lo siguiente:  ?   Aliviar el dolor y la inflamación.  ? Relajar los músculos.  · Medicamentos inyectables que ayudan a aliviar el dolor, la irritación y la inflamación alrededor del nervio ciático (corticoesteroides).  · Cirugía.    Siga estas indicaciones en su casa:  Medicamentos  · Tome los medicamentos de venta libre y los recetados solamente como se lo haya indicado el médico.  · No conduzca ni opere maquinaria pesada mientras toma analgésicos recetados.  Control del dolor  · Si se lo indican,  aplique hielo en la zona afectada.  ? Ponga el hielo en una bolsa plástica.  ? Coloque una toalla entre la piel y la bolsa de hielo.  ? Coloque el hielo durante 20 minutos, 2 a 3 veces por día.  · Después del hielo, aplique calor sobre la zona afectada antes de realizar ejercicio o con la frecuencia que le haya indicado el médico. Use la fuente de calor que el médico le recomiende, como una compresa de calor húmedo o una almohadilla térmica.  ? Coloque una toalla entre la piel y la fuente de calor.  ? Aplique el calor durante 20 a 30 minutos.  ? Retire la fuente de calor si la piel se pone de color rojo brillante. Esto es muy importante si no puede sentir el dolor, el calor o el frío. Puede correr un riesgo mayor de sufrir quemaduras.  Actividad  · Retome sus actividades normales como se lo haya indicado el médico. Pregúntele al médico qué actividades son seguras para usted.  ? Evite las actividades que empeoran los síntomas.  · Durante el día, descanse durante lapsos breves. Descansar recostado o de pie suele ser mejor que hacerlo sentado.  ? Cuando descanse durante períodos más largos, incorpore alguna actividad suave o ejercicios de elongación entre los períodos de descanso. Esto ayudará a evitar la rigidez y el dolor.  ? Evite estar sentado durante largos períodos de tiempo sin moverse. Levántese y muévase al menos una vez cada hora.  · Haga ejercicio y elongue habitualmente, como se lo haya indicado el médico.  · No levante nada que pese más de 10 libras (4,5 kg) mientras tenga síntomas de ciática. Aunque no tenga síntomas, evite levantar objetos pesados, en especial en forma repetida.  · Siempre use las técnicas de levantamiento correctas para levantar objetos, entre ellas:  ? Flexionar las rodillas.  ? Mantener la carga cerca del cuerpo.  ? No torcerse.  Instrucciones generales  · Mantenga una buena postura.  ? Evite reclinarse hacia adelante cuando esté sentado.  ? Evite encorvar la espalda mientras esté de  pie.  · Mantenga un peso saludable. El exceso de peso ejerce presión adicional sobre la espalda y hace que resulte difícil mantener una buena postura.  · Use calzado con buen apoyo y cómodo. Evite usar tacones.  · Evite dormir sobre un colchón que sea demasiado blando o demasiado duro. Un colchón que ofrezca un apoyo suficientemente firme para su espalda al dormir puede ayudar a aliviar el dolor.  · Concurra a todas las visitas de seguimiento como se lo haya indicado el médico. Esto es importante.  Comuníquese con un médico si:  · El dolor lo despierta cuando está dormido.  · El dolor empeora cuando se acuesta.  · El dolor es peor del que experimentó en el pasado.  · Los síntomas duran más de 4 semanas.  · Pierde peso en forma inexplicable.  Solicite ayuda de inmediato si:  · Pierde el control de la vejiga o del intestino (incontinencia).  ·   Tiene los siguientes síntomas:  ? Debilidad que empeora en la parte inferior de la espalda, la pelvis, las nalgas o las piernas.  ? Enrojecimiento o inflamación en la espalda.  ? Sensación de ardor al orinar.  Esta información no tiene como fin reemplazar el consejo del médico. Asegúrese de hacerle al médico cualquier pregunta que tenga.  Document Released: 02/27/2005 Document Revised: 07/11/2016 Document Reviewed: 11/06/2014  Elsevier Interactive Patient Education © 2018 [employer]

## 2017-12-13 NOTE — Progress Notes (Signed)
 Subjective:  Patient ID: Meghan Ford, female    DOB: 01/16/1941  Age: 77 y.o. MRN: [id number]  CC: Diabetes   HPI Meena Ford is a 77-year-old female with a history of type 2 diabetes mellitus (A1c 7.2), diabetic neuropathy, hypertension, Hyperlipidemia, previous noncompliance, right PCA stroke here for follow-up visit. She complains of bilateral neck pain which extends to her shoulders and does have limited range of motion of her left upper extremity.  Symptoms have been present for 2 weeks. She also complains of right hip pain which radiates down her right lower extremity with no preceding history of trauma and she denies history of falls.  She does have diabetic neuropathy but has been out of her gabapentin. There are no relieving or aggravating factors for her pain.  Her blood pressure is significantly elevated and she informs me she has been out of her antihypertensives.  Denies chest pains, blurry vision or shortness of breath With regards to her diabetes mellitus she denies hypoglycemia or visual concerns and does have diabetic neuropathy. She has noticed increase in size of a lesion on the right side of her neck and would like this excised. Three months ago she underwent left ureteroscopic stone extraction and left stent placement by urology and reports doing well She declines receiving a flu shot.  Past Medical History:  Diagnosis Date  . Arthritis    bil knees  . Blurred vision, left eye   . Blurry vision, bilateral   . Diabetes mellitus type II, uncontrolled (HCC)   . Diabetes mellitus without complication (HCC)   . Dysrhythmia    patient via intepreter reports at time of last stroke , her heartbeat was very irregular ; HR WDL today, patient denies any headache, chest pain, new numbness or new worsening vision since last stroke   . Fx humeral neck   . Gait disturbance   . History of kidney stones   . Hyperlipidemia LDL goal < 100   .  Hypertension   . Left arm weakness    ambulates with cane   . Leg pain    worse with prolonged standing  . Peripheral vascular disease (HCC)   . Pyelonephritis 06/14/2017  . Sepsis (HCC) 06/14/2017  . Status post placement of implantable loop recorder   . Stroke (HCC) 2018; 05/08/2017   denies residual from 2018 stroke on 05/08/2017; weakness on left; speech issues from today's stroke (05/08/2017)  . Varicose veins     Past Surgical History:  Procedure Laterality Date  . FRACTURE SURGERY Left 2017   Shoulder  . HOLMIUM LASER APPLICATION Left 09/18/2017   Procedure: HOLMIUM LASER APPLICATION;  Surgeon: McKenzie, Patrick L, MD;  Location: [facility];  Service: Urology;  Laterality: Left;  . LOOP RECORDER INSERTION N/A 05/09/2017   Procedure: LOOP RECORDER INSERTION;  Surgeon: Camnitz, Will Martin, MD;  Location: [facility];  Service: Cardiovascular;  Laterality: N/A;  . NEPHROLITHOTOMY Left 09/18/2017   Procedure: CYSTOSCOPY/LEFT URETEROSCOPIC STONE EXTRACTION/LEFT RETROGRADE/ STENT PLACEMENT;  Surgeon: McKenzie, Patrick L, MD;  Location: [facility];  Service: Urology;  Laterality: Left;  . REVERSE SHOULDER ARTHROPLASTY Left 10/27/2014   Procedure: REVERSE SHOULDER ARTHROPLASTY;  Surgeon: Timothy D Murphy, MD;  Location: [facility];  Service: Orthopedics;  Laterality: Left;  . TEE WITHOUT CARDIOVERSION N/A 05/09/2017   Procedure: TRANSESOPHAGEAL ECHOCARDIOGRAM (TEE);  Surgeon: Skains, Mark C, MD;  Location: [facility];  Service: Cardiovascular;  Laterality: N/A;  . TOTAL SHOULDER ARTHROPLASTY Left 10/27/2014   Procedure: TOTAL SHOULDER   ARTHROPLASTY;  Surgeon: Renette Butters, MD;  Location: [facility];  Service: Orthopedics;  Laterality: Left;  Marland Kitchen VEIN SURGERY Right 1998?   right leg    No Known Allergies   Outpatient Medications Prior to Visit  Medication Sig Dispense Refill  . aspirin 81 MG tablet Take 4 tablets (325 mg total) by mouth daily. 30 tablet 11  . blood glucose meter kit and supplies  KIT Dispense based on patient and insurance preference. Use up to four times daily as directed. (FOR ICD-9 250.00, 250.01). 1 each 0  . Blood Glucose Monitoring Suppl (TRUE METRIX METER) DEVI 1 each by Does not apply route 3 (three) times daily before meals. 1 Device 0  . glucose blood (TRUE METRIX BLOOD GLUCOSE TEST) test strip Use 3 times daily before meals 100 each 12  . glucose blood test strip Use as instructed 100 each 12  . Insulin Syringes, Disposable, U-100 0.3 ML MISC USE AS DIRECTED 100 each 11  . senna-docusate (SENOKOT-S) 8.6-50 MG tablet Take 1 tablet by mouth at bedtime as needed for mild constipation. 10 tablet 0  . traMADol (ULTRAM) 50 MG tablet Take 1 tablet (50 mg total) by mouth every 6 (six) hours as needed. 20 tablet 0  . TRUEPLUS LANCETS 28G MISC 1 each by Does not apply route 3 (three) times daily before meals. 100 each 12  . amLODipine (NORVASC) 10 MG tablet Take 1 tablet (10 mg total) by mouth daily. 30 tablet 5  . atorvastatin (LIPITOR) 80 MG tablet Take 1 tablet (80 mg total) by mouth daily. 30 tablet 5  . clopidogrel (PLAVIX) 75 MG tablet Take 1 tablet (75 mg total) by mouth daily. 30 tablet 1  . gabapentin (NEURONTIN) 300 MG capsule Take 1 capsule (300 mg total) by mouth at bedtime. 30 capsule 5  . insulin glargine (LANTUS) 100 UNIT/ML injection Inject 0.35 mLs (35 Units total) into the skin 2 (two) times daily. 30 mL 11  . isosorbide mononitrate (IMDUR) 60 MG 24 hr tablet Take 1 tablet (60 mg total) by mouth daily. 30 tablet 5  . lisinopril (PRINIVIL,ZESTRIL) 10 MG tablet Take 1 tablet (10 mg total) by mouth daily. 30 tablet 5  . ondansetron (ZOFRAN ODT) 4 MG disintegrating tablet Take 1 tablet (4 mg total) by mouth every 8 (eight) hours as needed for nausea or vomiting. (Patient not taking: Reported on 12/13/2017) 20 tablet 0  . tamsulosin (FLOMAX) 0.4 MG CAPS capsule Take 1 capsule (0.4 mg total) by mouth at bedtime. (Patient not taking: Reported on 12/13/2017) 14  capsule 0   No facility-administered medications prior to visit.     ROS Review of Systems  Constitutional: Negative for activity change, appetite change and fatigue.  HENT: Negative for congestion, sinus pressure and sore throat.   Eyes: Negative for visual disturbance.  Respiratory: Negative for cough, chest tightness, shortness of breath and wheezing.   Cardiovascular: Negative for chest pain and palpitations.  Gastrointestinal: Negative for abdominal distention, abdominal pain and constipation.  Endocrine: Negative for polydipsia.  Genitourinary: Negative for dysuria and frequency.  Musculoskeletal:       See hpi  Skin:       See hpi  Neurological: Negative for tremors, light-headedness and numbness.  Hematological: Does not bruise/bleed easily.  Psychiatric/Behavioral: Negative for agitation and behavioral problems.    Objective:  BP (!) 194/74   Pulse 66   Temp 97.8 F (36.6 C) (Oral)   Ht 5' 4" (1.626 m)   Wt  202 lb 12.8 oz (92 kg)   SpO2 98%   BMI 34.81 kg/m   BP/Weight 12/13/2017 09/18/2017 09/18/2017  Systolic BP 194 160 144  Diastolic BP 74 68 63  Wt. (Lbs) 202.8 200 200  BMI 34.81 32.28 32.28      Physical Exam  Constitutional: She is oriented to person, place, and time. She appears well-developed and well-nourished.  HENT:  Right Ear: External ear normal.  Left Ear: External ear normal.  Mouth/Throat: Oropharynx is clear and moist.  Cardiovascular: Normal rate, normal heart sounds and intact distal pulses.  No murmur heard. Pulmonary/Chest: Effort normal and breath sounds normal. She has no wheezes. She has no rales. She exhibits no tenderness.  Abdominal: Soft. Bowel sounds are normal. She exhibits no distension and no mass. There is no tenderness.  Musculoskeletal: Normal range of motion.  Slight tenderness on palpation of bilateral trapezius muscles; left arm abduction limited to 100 degrees. Tenderness on palpation of anterior right hip; no lumbar  spine tenderness.  Negative straight leg raise bilaterally  Neurological: She is alert and oriented to person, place, and time.  Normal handgrip Normal motor strength in upper and lower extremities  Skin:  Right neck pedunculated wart    CMP Latest Ref Rng & Units 09/14/2017 08/09/2017 07/17/2017  Glucose 70 - 99 mg/dL 179(H) 131(H) 145(H)  BUN 8 - 23 mg/dL 30(H) 25(H) 23(H)  Creatinine 0.44 - 1.00 mg/dL 1.04(H) 0.82 0.95  Sodium 135 - 145 mmol/L 139 141 139  Potassium 3.5 - 5.1 mmol/L 4.9 4.5 4.0  Chloride 98 - 111 mmol/L 102 106 105  CO2 22 - 32 mmol/L 27 25 26  Calcium 8.9 - 10.3 mg/dL 9.4 9.5 8.7(L)  Total Protein 6.5 - 8.1 g/dL - - 6.4(L)  Total Bilirubin 0.3 - 1.2 mg/dL - - 0.8  Alkaline Phos 38 - 126 U/L - - 68  AST 15 - 41 U/L - - 19  ALT 14 - 54 U/L - - 13(L)    Lipid Panel     Component Value Date/Time   CHOL 122 05/08/2017 0503   TRIG 179 (H) 05/08/2017 0503   HDL 28 (L) 05/08/2017 0503   CHOLHDL 4.4 05/08/2017 0503   VLDL 36 05/08/2017 0503   LDLCALC 58 05/08/2017 0503    Lab Results  Component Value Date   HGBA1C 7.2 (A) 12/13/2017     Assessment & Plan:   1. Type 2 diabetes mellitus with other specified complication, with long-term current use of insulin (HCC) Controlled with A1c of 7.2 Counseled on Diabetic diet, my plate method, 150 minutes of moderate intensity exercise/week Keep blood sugar logs with fasting goals of 80-120 mg/dl, random of less than 180 and in the event of sugars less than 60 mg/dl or greater than 400 mg/dl please notify the clinic ASAP. It is recommended that you undergo annual eye exams and annual foot exams. Pneumonia vaccine is recommended. - POCT glucose (manual entry) - POCT glycosylated hemoglobin (Hb A1C) - insulin glargine (LANTUS) 100 UNIT/ML injection; Inject 0.35 mLs (35 Units total) into the skin 2 (two) times daily.  Dispense: 30 mL; Refill: 6  2. Essential hypertension Uncontrolled due to running out of her  antihypertensive -no evidence of target endorgan damage Unable to administer clonidine due to heart rate at lower border of normal Compliance emphasized Counseled on blood pressure goal of less than 130/80, low-sodium, DASH diet, medication compliance, 150 minutes of moderate intensity exercise per week. Discussed medication compliance, adverse effects. -   amLODipine (NORVASC) 10 MG tablet; Take 1 tablet (10 mg total) by mouth daily.  Dispense: 30 tablet; Refill: 5 - isosorbide mononitrate (IMDUR) 60 MG 24 hr tablet; Take 1 tablet (60 mg total) by mouth daily.  Dispense: 30 tablet; Refill: 5 - lisinopril (PRINIVIL,ZESTRIL) 10 MG tablet; Take 1 tablet (10 mg total) by mouth daily.  Dispense: 30 tablet; Refill: 5  3. Pure hypercholesterolemia Controlled Low-cholesterol diet - atorvastatin (LIPITOR) 80 MG tablet; Take 1 tablet (80 mg total) by mouth daily.  Dispense: 30 tablet; Refill: 5  4. Cerebrovascular accident (CVA) due to embolism of cerebral artery (HCC) Stable Risk factor modifications - clopidogrel (PLAVIX) 75 MG tablet; Take 1 tablet (75 mg total) by mouth daily.  Dispense: 30 tablet; Refill: 5  5. Type 2 diabetes mellitus with diabetic neuropathy, with long-term current use of insulin (HCC) She has been out of gabapentin which I have refilled - gabapentin (NEURONTIN) 300 MG capsule; Take 1 capsule (300 mg total) by mouth at bedtime.  Dispense: 30 capsule; Refill: 5  6. Sciatica of right side Holding off on placing a muscle relaxant as she experienced sedation in the past Advised to perform stretching exercises Refill of gabapentin which she has been out of - gabapentin (NEURONTIN) 300 MG capsule; Take 1 capsule (300 mg total) by mouth at bedtime.  Dispense: 30 capsule; Refill: 5  7. Viral warts, unspecified type Will refer to Derm as she is requesting excision - Ambulatory referral to Dermatology  8. Adhesive capsulitis of left shoulder - Ambulatory referral to Physical  Therapy   Meds ordered this encounter  Medications  . amLODipine (NORVASC) 10 MG tablet    Sig: Take 1 tablet (10 mg total) by mouth daily.    Dispense:  30 tablet    Refill:  5  . atorvastatin (LIPITOR) 80 MG tablet    Sig: Take 1 tablet (80 mg total) by mouth daily.    Dispense:  30 tablet    Refill:  5  . clopidogrel (PLAVIX) 75 MG tablet    Sig: Take 1 tablet (75 mg total) by mouth daily.    Dispense:  30 tablet    Refill:  5  . gabapentin (NEURONTIN) 300 MG capsule    Sig: Take 1 capsule (300 mg total) by mouth at bedtime.    Dispense:  30 capsule    Refill:  5    Discontinue previous dose  . insulin glargine (LANTUS) 100 UNIT/ML injection    Sig: Inject 0.35 mLs (35 Units total) into the skin 2 (two) times daily.    Dispense:  30 mL    Refill:  6  . isosorbide mononitrate (IMDUR) 60 MG 24 hr tablet    Sig: Take 1 tablet (60 mg total) by mouth daily.    Dispense:  30 tablet    Refill:  5  . lisinopril (PRINIVIL,ZESTRIL) 10 MG tablet    Sig: Take 1 tablet (10 mg total) by mouth daily.    Dispense:  30 tablet    Refill:  5    Follow-up: Return in about 3 months (around 03/15/2018) for Follow-up of chronic medical conditions.     MD   

## 2017-12-17 ENCOUNTER — Telehealth: Payer: Self-pay | Admitting: *Deleted

## 2017-12-17 NOTE — Telephone Encounter (Signed)
LMOVM for Meghan Ford requesting manual Carelink transmission for review. Gave instructions and DC phone number for questions/concerns.  Will review 4 "AF" episodes that didn't transmit automatically. Available ECG shows SR w/PACs, not AF.

## 2017-12-24 ENCOUNTER — Ambulatory Visit (INDEPENDENT_AMBULATORY_CARE_PROVIDER_SITE_OTHER): Payer: Self-pay | Admitting: *Deleted

## 2017-12-24 DIAGNOSIS — I639 Cerebral infarction, unspecified: Secondary | ICD-10-CM

## 2017-12-25 NOTE — Progress Notes (Signed)
Carelink Summary Report / Loop Recorder 

## 2017-12-25 NOTE — Telephone Encounter (Signed)
LVMOM requesting manual Transmission. Available "AF" episodes reveal SR with PACs. Gave [facility] phone number to call back if questions or concerns.

## 2018-01-03 NOTE — Telephone Encounter (Signed)
LMOVM for Alecia Lemming requesting manual Carelink transmission for review. Gave instructions and DC phone number for questions/concerns.

## 2018-01-07 LAB — CUP PACEART REMOTE DEVICE CHECK
MDC IDC PG IMPLANT DT: 20190227
MDC IDC SESS DTM: 20191014020555

## 2018-01-14 MED FILL — CLOPIDOGREL 75 MG TABLET: 75 | 30 days supply | Qty: 30 | Fill #1

## 2018-01-14 MED FILL — ISOSORBIDE MN ER 60 MG TAB: 60 | 30 days supply | Qty: 30 | Fill #1

## 2018-01-14 MED FILL — AMLODIPINE BESYLATE 10 MG T: 10 | 30 days supply | Qty: 30 | Fill #1

## 2018-01-14 MED FILL — !LANTUS 100 UNITS/ML VIAL: 100 | 28 days supply | Qty: 20 | Fill #1

## 2018-01-14 MED FILL — GABAPENTIN 300 MG CAPSULE: 300 | 30 days supply | Qty: 30 | Fill #1

## 2018-01-14 MED FILL — ATORVASTATIN 80 MG TABLET: 80 | 30 days supply | Qty: 30 | Fill #1

## 2018-01-14 MED FILL — LISINOPRIL 10 MG TABS: 10 | 30 days supply | Qty: 30 | Fill #1

## 2018-01-15 ENCOUNTER — Other Ambulatory Visit (HOSPITAL_COMMUNITY): Payer: Self-pay

## 2018-01-16 NOTE — Telephone Encounter (Signed)
Manual transmission received. ECGs appear SR w/PACs. ECGs placed in Dr. Mickey Farber folder for review.

## 2018-01-25 ENCOUNTER — Telehealth: Payer: Self-pay | Admitting: *Deleted

## 2018-01-25 ENCOUNTER — Ambulatory Visit (INDEPENDENT_AMBULATORY_CARE_PROVIDER_SITE_OTHER): Payer: Self-pay | Admitting: *Deleted

## 2018-01-25 DIAGNOSIS — I639 Cerebral infarction, unspecified: Secondary | ICD-10-CM

## 2018-01-25 NOTE — Telephone Encounter (Signed)
Spoke with patient's daughter in law to have the patient or Alecia LemmingVictor patient's son ([facility]) give us a call. Attempted to request a manual transmission. Gave [facility] phone number.

## 2018-01-28 NOTE — Progress Notes (Signed)
Carelink Summary Report / Loop Recorder 

## 2018-01-30 NOTE — Telephone Encounter (Signed)
Spoke with Victor's wife, Sheran LawlessMadeline. Requested manual transmission from patient's monitor. She reports she will make Alecia LemmingVictor aware.

## 2018-02-04 NOTE — Telephone Encounter (Signed)
LVMOM on Victor's phone ([facility]) requesting a manual transmission. Gave [facility] phone number.

## 2018-02-11 MED FILL — ISOSORBIDE MN ER 60 MG TAB: 60 | 30 days supply | Qty: 30 | Fill #2

## 2018-02-11 MED FILL — CLOPIDOGREL 75 MG TABLET: 75 | 30 days supply | Qty: 30 | Fill #2

## 2018-02-11 MED FILL — AMLODIPINE BESYLATE 10 MG T: 10 | 30 days supply | Qty: 30 | Fill #2

## 2018-02-11 MED FILL — !LANTUS 100 UNITS/ML VIAL: 100 | 28 days supply | Qty: 20 | Fill #2

## 2018-02-11 MED FILL — GABAPENTIN 300 MG CAPSULE: 300 | 30 days supply | Qty: 30 | Fill #2

## 2018-02-11 MED FILL — LISINOPRIL 10 MG TABS: 10 | 30 days supply | Qty: 30 | Fill #2

## 2018-02-11 MED FILL — ATORVASTATIN 80 MG TABLET: 80 | 30 days supply | Qty: 30 | Fill #2

## 2018-02-19 NOTE — Telephone Encounter (Signed)
LMOVM for Meghan LemmingVictor [facility] requesting manual Carelink transmission for review.  LMOVM for Meghan Ford ([facility]) requesting manual Carelink transmission for review.

## 2018-02-26 ENCOUNTER — Encounter: Payer: Self-pay | Admitting: *Deleted

## 2018-02-26 NOTE — Telephone Encounter (Signed)
Letter and Carelink monitor instructions mailed as manual transmission has not been received as of 02/26/18.

## 2018-02-27 ENCOUNTER — Ambulatory Visit (INDEPENDENT_AMBULATORY_CARE_PROVIDER_SITE_OTHER): Payer: Self-pay

## 2018-02-27 DIAGNOSIS — I639 Cerebral infarction, unspecified: Secondary | ICD-10-CM

## 2018-02-28 NOTE — Progress Notes (Signed)
Carelink Summary Report / Loop Recorder 

## 2018-03-07 ENCOUNTER — Encounter: Payer: Self-pay | Admitting: Cardiology

## 2018-03-07 NOTE — Telephone Encounter (Signed)
Certified letter mailed 03/07/18 as manual transmission still not received.

## 2018-03-14 NOTE — Telephone Encounter (Signed)
Manual transmission received 03/12/18. Available "AF" ECGs show SR w/PACs, not true AF. ECGs printed and placed in Dr. Mickey Farber folder for review.

## 2018-03-15 ENCOUNTER — Other Ambulatory Visit: Payer: Self-pay | Admitting: Cardiology

## 2018-03-17 LAB — CUP PACEART REMOTE DEVICE CHECK
Date Time Interrogation Session: 20191116031136
Implantable Pulse Generator Implant Date: 20190227

## 2018-03-18 ENCOUNTER — Ambulatory Visit: Payer: Self-pay | Attending: Family Medicine | Admitting: Family Medicine

## 2018-03-18 ENCOUNTER — Encounter: Payer: Self-pay | Admitting: Family Medicine

## 2018-03-18 VITALS — BP 167/75 | HR 64 | Temp 97.8°F | Ht 64.0 in | Wt 209.2 lb

## 2018-03-18 DIAGNOSIS — E785 Hyperlipidemia, unspecified: Secondary | ICD-10-CM | POA: Insufficient documentation

## 2018-03-18 DIAGNOSIS — E1165 Type 2 diabetes mellitus with hyperglycemia: Secondary | ICD-10-CM

## 2018-03-18 DIAGNOSIS — E7800 Pure hypercholesterolemia, unspecified: Secondary | ICD-10-CM

## 2018-03-18 DIAGNOSIS — E1140 Type 2 diabetes mellitus with diabetic neuropathy, unspecified: Secondary | ICD-10-CM

## 2018-03-18 DIAGNOSIS — Z79899 Other long term (current) drug therapy: Secondary | ICD-10-CM | POA: Insufficient documentation

## 2018-03-18 DIAGNOSIS — Z7902 Long term (current) use of antithrombotics/antiplatelets: Secondary | ICD-10-CM | POA: Insufficient documentation

## 2018-03-18 DIAGNOSIS — Z87442 Personal history of urinary calculi: Secondary | ICD-10-CM | POA: Insufficient documentation

## 2018-03-18 DIAGNOSIS — I10 Essential (primary) hypertension: Secondary | ICD-10-CM

## 2018-03-18 DIAGNOSIS — Z8673 Personal history of transient ischemic attack (TIA), and cerebral infarction without residual deficits: Secondary | ICD-10-CM | POA: Insufficient documentation

## 2018-03-18 DIAGNOSIS — M5431 Sciatica, right side: Secondary | ICD-10-CM

## 2018-03-18 DIAGNOSIS — I6340 Cerebral infarction due to embolism of unspecified cerebral artery: Secondary | ICD-10-CM

## 2018-03-18 DIAGNOSIS — E1151 Type 2 diabetes mellitus with diabetic peripheral angiopathy without gangrene: Secondary | ICD-10-CM | POA: Insufficient documentation

## 2018-03-18 DIAGNOSIS — Z7982 Long term (current) use of aspirin: Secondary | ICD-10-CM | POA: Insufficient documentation

## 2018-03-18 DIAGNOSIS — Z794 Long term (current) use of insulin: Secondary | ICD-10-CM

## 2018-03-18 DIAGNOSIS — E1142 Type 2 diabetes mellitus with diabetic polyneuropathy: Secondary | ICD-10-CM | POA: Insufficient documentation

## 2018-03-18 DIAGNOSIS — E1169 Type 2 diabetes mellitus with other specified complication: Secondary | ICD-10-CM | POA: Insufficient documentation

## 2018-03-18 LAB — POCT GLYCOSYLATED HEMOGLOBIN (HGB A1C): Hemoglobin A1C: 8.8 % — AB (ref 4.0–5.6)

## 2018-03-18 LAB — GLUCOSE, POCT (MANUAL RESULT ENTRY): POC Glucose: 198 mg/dl — AB (ref 70–99)

## 2018-03-18 MED ORDER — INSULIN GLARGINE 100 UNIT/ML ~~LOC~~ SOLN: 38.0000 [IU] | Freq: Two times a day (BID) | SUBCUTANEOUS | 6 refills | Status: DC

## 2018-03-18 MED ORDER — AMLODIPINE BESYLATE 10 MG PO TABS: 10.0000 mg | ORAL_TABLET | Freq: Every day | ORAL | 5 refills | Status: DC

## 2018-03-18 MED ORDER — GABAPENTIN 300 MG PO CAPS: 300.0000 mg | ORAL_CAPSULE | Freq: Every day | ORAL | 5 refills | Status: DC

## 2018-03-18 MED ORDER — LISINOPRIL 10 MG PO TABS: 10.0000 mg | ORAL_TABLET | Freq: Every day | ORAL | 5 refills | Status: DC

## 2018-03-18 MED ORDER — ISOSORBIDE MONONITRATE ER 60 MG PO TB24: 60.0000 mg | ORAL_TABLET | Freq: Every day | ORAL | 5 refills | Status: DC

## 2018-03-18 MED ORDER — ATORVASTATIN CALCIUM 80 MG PO TABS: 80.0000 mg | ORAL_TABLET | Freq: Every day | ORAL | 5 refills | Status: DC

## 2018-03-18 MED ORDER — CLOPIDOGREL BISULFATE 75 MG PO TABS: 75.0000 mg | ORAL_TABLET | Freq: Every day | ORAL | 5 refills | Status: DC

## 2018-03-18 MED ORDER — "INSULIN SYRINGE-NEEDLE U-100 31G X 15/64"" 0.5 ML MISC": 1.0000 | Freq: Every day | 6 refills | Status: DC

## 2018-03-18 NOTE — Progress Notes (Signed)
Subjective:  Patient ID: Meghan Ford, female    DOB: 1940-07-30  Age: 78 y.o. MRN: [id number]  CC: Diabetes   HPI Meghan Ford is a 78 year old female with a history of type 2 diabetes mellitus (A1c 8.8), diabetic neuropathy, hypertension, Hyperlipidemia, previous noncompliance, right PCA stroke (s/p loop recorder)  here for follow-up visit. Her A1c is 8.8 which has trended up from 7.2 previously and she endorses compliance with Lantus until she ran out 2 days ago She has cut back on he rice and breads. Neuropathy is controlled on Gabapentin. Her BP is elevated and she is yet to take her antihypertensives as she ran out. With regards to her CVA she denies new weakness and has been compliant with  Plavix. Her loop recorder was interrogated 2 months ago with no abnormalities detected. She ambulates with a cane and denies recent falls.  Past Medical History:  Diagnosis Date  . Arthritis    bil knees  . Blurred vision, left eye   . Blurry vision, bilateral   . Diabetes mellitus type II, uncontrolled ([place])   . Diabetes mellitus without complication ([place])   . Dysrhythmia    patient via intepreter reports at time of last stroke , her heartbeat was very irregular ; HR WDL today, patient denies any headache, chest pain, new numbness or new worsening vision since last stroke   . Fx humeral neck   . Gait disturbance   . History of kidney stones   . Hyperlipidemia LDL goal < 100   . Hypertension   . Left arm weakness    ambulates with cane   . Leg pain    worse with prolonged standing  . Peripheral vascular disease ([place])   . Pyelonephritis 06/14/2017  . Sepsis ([place]) 06/14/2017  . Status post placement of implantable loop recorder   . Stroke [facility]) 2018; 05/08/2017   denies residual from 2018 stroke on 05/08/2017; weakness on left; speech issues from today's stroke (05/08/2017)  . Varicose veins     Past Surgical History:  Procedure Laterality Date  . FRACTURE  SURGERY Left 2017   Shoulder  . HOLMIUM LASER APPLICATION Left 04/18/35   Procedure: HOLMIUM LASER APPLICATION;  Surgeon: Cleon Gustin, MD;  Location: [facility];  Service: Urology;  Laterality: Left;  . LOOP RECORDER INSERTION N/A 05/09/2017   Procedure: LOOP RECORDER INSERTION;  Surgeon: Constance Haw, MD;  Location: [facility];  Service: Cardiovascular;  Laterality: N/A;  . NEPHROLITHOTOMY Left 09/18/2017   Procedure: CYSTOSCOPY/LEFT URETEROSCOPIC STONE EXTRACTION/LEFT RETROGRADE/ STENT PLACEMENT;  Surgeon: Cleon Gustin, MD;  Location: [facility];  Service: Urology;  Laterality: Left;  . REVERSE SHOULDER ARTHROPLASTY Left 10/27/2014   Procedure: REVERSE SHOULDER ARTHROPLASTY;  Surgeon: Renette Butters, MD;  Location: [facility];  Service: Orthopedics;  Laterality: Left;  . TEE WITHOUT CARDIOVERSION N/A 05/09/2017   Procedure: TRANSESOPHAGEAL ECHOCARDIOGRAM (TEE);  Surgeon: Jerline Pain, MD;  Location[facility];  Service: Cardiovascular;  Laterality: N/A;  . TOTAL SHOULDER ARTHROPLASTY Left 10/27/2014   Procedure: TOTAL SHOULDER ARTHROPLASTY;  Surgeon: Renette Butters, MD;  Location: [place];  Service: Orthopedics;  Laterality: Left;  [facility] Kitchen VEIN SURGERY Right 1998?   right leg    No Known Allergies '  Outpatient Medications Prior to Visit  Medication Sig Dispense Refill  . aspirin 81 MG tablet Take 4 tablets (325 mg total) by mouth daily. 30 tablet 11  . blood glucose meter kit and supplies KIT Dispense based on patient  and insurance preference. Use up to four times daily as directed. (FOR ICD-9 250.00, 250.01). 1 each 0  . Blood Glucose Monitoring Suppl (TRUE METRIX METER) DEVI 1 each by Does not apply route 3 (three) times daily before meals. 1 Device 0  . glucose blood (TRUE METRIX BLOOD GLUCOSE TEST) test strip Use 3 times daily before meals 100 each 12  . glucose blood test strip Use as instructed 100 each 12  . Insulin Syringes, Disposable, U-100 0.3 ML MISC USE AS  DIRECTED 100 each 11  . senna-docusate (SENOKOT-S) 8.6-50 MG tablet Take 1 tablet by mouth at bedtime as needed for mild constipation. 10 tablet 0  . TRUEPLUS LANCETS 28G MISC 1 each by Does not apply route 3 (three) times daily before meals. 100 each 12  . amLODipine (NORVASC) 10 MG tablet Take 1 tablet (10 mg total) by mouth daily. 30 tablet 5  . atorvastatin (LIPITOR) 80 MG tablet Take 1 tablet (80 mg total) by mouth daily. 30 tablet 5  . clopidogrel (PLAVIX) 75 MG tablet Take 1 tablet (75 mg total) by mouth daily. 30 tablet 5  . gabapentin (NEURONTIN) 300 MG capsule Take 1 capsule (300 mg total) by mouth at bedtime. 30 capsule 5  . insulin glargine (LANTUS) 100 UNIT/ML injection Inject 0.35 mLs (35 Units total) into the skin 2 (two) times daily. 30 mL 6  . isosorbide mononitrate (IMDUR) 60 MG 24 hr tablet Take 1 tablet (60 mg total) by mouth daily. 30 tablet 5  . lisinopril (PRINIVIL,ZESTRIL) 10 MG tablet Take 1 tablet (10 mg total) by mouth daily. 30 tablet 5  . ondansetron (ZOFRAN ODT) 4 MG disintegrating tablet Take 1 tablet (4 mg total) by mouth every 8 (eight) hours as needed for nausea or vomiting. (Patient not taking: Reported on 12/13/2017) 20 tablet 0  . tamsulosin (FLOMAX) 0.4 MG CAPS capsule Take 1 capsule (0.4 mg total) by mouth at bedtime. (Patient not taking: Reported on 12/13/2017) 14 capsule 0  . traMADol (ULTRAM) 50 MG tablet Take 1 tablet (50 mg total) by mouth every 6 (six) hours as needed. (Patient not taking: Reported on 03/18/2018) 20 tablet 0   No facility-administered medications prior to visit.     ROS Review of Systems  Constitutional: Negative for activity change, appetite change and fatigue.  HENT: Negative for congestion, sinus pressure and sore throat.   Eyes: Negative for visual disturbance.  Respiratory: Negative for cough, chest tightness, shortness of breath and wheezing.   Cardiovascular: Negative for chest pain and palpitations.  Gastrointestinal: Negative  for abdominal distention, abdominal pain and constipation.  Endocrine: Negative for polydipsia.  Genitourinary: Negative for dysuria and frequency.  Musculoskeletal: Negative for arthralgias and back pain.  Skin: Negative for rash.  Neurological: Negative for tremors, light-headedness and numbness.  Hematological: Does not bruise/bleed easily.  Psychiatric/Behavioral: Negative for agitation and behavioral problems.    Objective:  BP (!) 167/75   Pulse 64   Temp 97.8 F (36.6 C) (Oral)   Ht _0  (1.626 m)   Wt 209 lb 3.2 oz (94.9 kg)   SpO2 99%   BMI 35.91 kg/m   BP/Weight 03/18/2018 16/03/958 06/15/4096  Systolic BP 119 147 829  Diastolic BP 75 74 68  Wt. (Lbs) 209.2 202.8 200  BMI 35.91 34.81 32.28      Physical Exam Constitutional:      Appearance: She is well-developed.  Cardiovascular:     Rate and Rhythm: Normal rate.     Heart  sounds: Normal heart sounds. No murmur.  Pulmonary:     Effort: Pulmonary effort is normal.     Breath sounds: Normal breath sounds. No wheezing or rales.  Chest:     Chest wall: No tenderness.  Abdominal:     General: Bowel sounds are normal. There is no distension.     Palpations: Abdomen is soft. There is no mass.     Tenderness: There is no abdominal tenderness.  Musculoskeletal:     Comments: Left shoulder abduction limited to 70 degrees  Neurological:     Mental Status: She is alert and oriented to person, place, and time.  Psychiatric:        Mood and Affect: Mood normal.        Behavior: Behavior normal.      Lab Results  Component Value Date   HGBA1C 8.8 (A) 03/18/2018    Assessment & Plan:   1. Type 2 diabetes mellitus with other specified complication, with long-term current use of insulin (HCC) Uncontrolled with A1c of 8.8 which has trended up from 7.2 previously Increase Lantus dose Counseled on Diabetic diet, my plate method, 226 minutes of moderate intensity exercise/week Keep blood sugar logs with fasting  goals of 80-120 mg/dl, random of less than 180 and in the event of sugars less than 60 mg/dl or greater than 400 mg/dl please notify the clinic ASAP. It is recommended that you undergo annual eye exams and annual foot exams. Pneumonia vaccine is recommended. - POCT glucose (manual entry) - POCT glycosylated hemoglobin (Hb A1C) - insulin glargine (LANTUS) 100 UNIT/ML injection; Inject 0.38 mLs (38 Units total) into the skin 2 (two) times daily.  Dispense: 30 mL; Refill: 6  2. Essential hypertension Uncontrolled Yet to take antihypertensive Counseled on blood pressure goal of less than 130/80, low-sodium, DASH diet, medication compliance, 150 minutes of moderate intensity exercise per week. Discussed medication compliance, adverse effects. - amLODipine (NORVASC) 10 MG tablet; Take 1 tablet (10 mg total) by mouth daily.  Dispense: 30 tablet; Refill: 5 - isosorbide mononitrate (IMDUR) 60 MG 24 hr tablet; Take 1 tablet (60 mg total) by mouth daily.  Dispense: 30 tablet; Refill: 5 - lisinopril (PRINIVIL,ZESTRIL) 10 MG tablet; Take 1 tablet (10 mg total) by mouth daily.  Dispense: 30 tablet; Refill: 5  3. Pure hypercholesterolemia Stable Low cholesterol diet - atorvastatin (LIPITOR) 80 MG tablet; Take 1 tablet (80 mg total) by mouth daily.  Dispense: 30 tablet; Refill: 5  4. Cerebrovascular accident (CVA) due to embolism of cerebral artery (HCC) Risk factor modification - clopidogrel (PLAVIX) 75 MG tablet; Take 1 tablet (75 mg total) by mouth daily.  Dispense: 30 tablet; Refill: 5  5. Type 2 diabetes mellitus with diabetic neuropathy, with long-term current use of insulin (HCC) Neuropathy is stable - gabapentin (NEURONTIN) 300 MG capsule; Take 1 capsule (300 mg total) by mouth at bedtime.  Dispense: 30 capsule; Refill: 5  6. Sciatica of right side Stable - gabapentin (NEURONTIN) 300 MG capsule; Take 1 capsule (300 mg total) by mouth at bedtime.  Dispense: 30 capsule; Refill: 5   Meds  ordered this encounter  Medications  . insulin glargine (LANTUS) 100 UNIT/ML injection    Sig: Inject 0.38 mLs (38 Units total) into the skin 2 (two) times daily.    Dispense:  30 mL    Refill:  6  . Insulin Syringe-Needle U-100 (BD INSULIN SYRINGE ULTRAFINE) 31G X 15/64" 0.5 ML MISC    Sig: 1 each by Does not  apply route daily.    Dispense:  60 each    Refill:  6  . amLODipine (NORVASC) 10 MG tablet    Sig: Take 1 tablet (10 mg total) by mouth daily.    Dispense:  30 tablet    Refill:  5  . atorvastatin (LIPITOR) 80 MG tablet    Sig: Take 1 tablet (80 mg total) by mouth daily.    Dispense:  30 tablet    Refill:  5  . clopidogrel (PLAVIX) 75 MG tablet    Sig: Take 1 tablet (75 mg total) by mouth daily.    Dispense:  30 tablet    Refill:  5  . gabapentin (NEURONTIN) 300 MG capsule    Sig: Take 1 capsule (300 mg total) by mouth at bedtime.    Dispense:  30 capsule    Refill:  5    Discontinue previous dose  . isosorbide mononitrate (IMDUR) 60 MG 24 hr tablet    Sig: Take 1 tablet (60 mg total) by mouth daily.    Dispense:  30 tablet    Refill:  5  . lisinopril (PRINIVIL,ZESTRIL) 10 MG tablet    Sig: Take 1 tablet (10 mg total) by mouth daily.    Dispense:  30 tablet    Refill:  5    Follow-up: Return in about 3 months (around 06/17/2018) for Follow-up of chronic medical conditions.   Charlott Rakes MD

## 2018-03-18 NOTE — Patient Instructions (Signed)
Diabetes Mellitus and Nutrition, Adult  When you have diabetes (diabetes mellitus), it is very important to have healthy eating habits because your blood sugar (glucose) levels are greatly affected by what you eat and drink. Eating healthy foods in the appropriate amounts, at about the same times every day, can help you:  · Control your blood glucose.  · Lower your risk of heart disease.  · Improve your blood pressure.  · Reach or maintain a healthy weight.  Every person with diabetes is different, and each person has different needs for a meal plan. Your health care provider may recommend that you work with a diet and nutrition specialist (dietitian) to make a meal plan that is best for you. Your meal plan may vary depending on factors such as:  · The calories you need.  · The medicines you take.  · Your weight.  · Your blood glucose, blood pressure, and cholesterol levels.  · Your activity level.  · Other health conditions you have, such as heart or kidney disease.  How do carbohydrates affect me?  Carbohydrates, also called carbs, affect your blood glucose level more than any other type of food. Eating carbs naturally raises the amount of glucose in your blood. Carb counting is a method for keeping track of how many carbs you eat. Counting carbs is important to keep your blood glucose at a healthy level, especially if you use insulin or take certain oral diabetes medicines.  It is important to know how many carbs you can safely have in each meal. This is different for every person. Your dietitian can help you calculate how many carbs you should have at each meal and for each snack.  Foods that contain carbs include:  · Bread, cereal, rice, pasta, and crackers.  · Potatoes and corn.  · Peas, beans, and lentils.  · Milk and yogurt.  · Fruit and juice.  · Desserts, such as cakes, cookies, ice cream, and candy.  How does alcohol affect me?  Alcohol can cause a sudden decrease in blood glucose (hypoglycemia),  especially if you use insulin or take certain oral diabetes medicines. Hypoglycemia can be a life-threatening condition. Symptoms of hypoglycemia (sleepiness, dizziness, and confusion) are similar to symptoms of having too much alcohol.  If your health care provider says that alcohol is safe for you, follow these guidelines:  · Limit alcohol intake to no more than 1 drink per day for nonpregnant women and 2 drinks per day for men. One drink equals 12 oz of beer, 5 oz of wine, or 1½ oz of hard liquor.  · Do not drink on an empty stomach.  · Keep yourself hydrated with water, diet soda, or unsweetened iced tea.  · Keep in mind that regular soda, juice, and other mixers may contain a lot of sugar and must be counted as carbs.  What are tips for following this plan?    Reading food labels  · Start by checking the serving size on the "Nutrition Facts" label of packaged foods and drinks. The amount of calories, carbs, fats, and other nutrients listed on the label is based on one serving of the item. Many items contain more than one serving per package.  · Check the total grams (g) of carbs in one serving. You can calculate the number of servings of carbs in one serving by dividing the total carbs by 15. For example, if a food has 30 g of total carbs, it would be equal to 2   servings of carbs.  · Check the number of grams (g) of saturated and trans fats in one serving. Choose foods that have low or no amount of these fats.  · Check the number of milligrams (mg) of salt (sodium) in one serving. Most people should limit total sodium intake to less than 2,300 mg per day.  · Always check the nutrition information of foods labeled as "low-fat" or "nonfat". These foods may be higher in added sugar or refined carbs and should be avoided.  · Talk to your dietitian to identify your daily goals for nutrients listed on the label.  Shopping  · Avoid buying canned, premade, or processed foods. These foods tend to be high in fat, sodium,  and added sugar.  · Shop around the outside edge of the grocery store. This includes fresh fruits and vegetables, bulk grains, fresh meats, and fresh dairy.  Cooking  · Use low-heat cooking methods, such as baking, instead of high-heat cooking methods like deep frying.  · Cook using healthy oils, such as olive, canola, or sunflower oil.  · Avoid cooking with butter, cream, or high-fat meats.  Meal planning  · Eat meals and snacks regularly, preferably at the same times every day. Avoid going long periods of time without eating.  · Eat foods high in fiber, such as fresh fruits, vegetables, beans, and whole grains. Talk to your dietitian about how many servings of carbs you can eat at each meal.  · Eat 4-6 ounces (oz) of lean protein each day, such as lean meat, chicken, fish, eggs, or tofu. One oz of lean protein is equal to:  ? 1 oz of meat, chicken, or fish.  ? 1 egg.  ? ¼ cup of tofu.  · Eat some foods each day that contain healthy fats, such as avocado, nuts, seeds, and fish.  Lifestyle  · Check your blood glucose regularly.  · Exercise regularly as told by your health care provider. This may include:  ? 150 minutes of moderate-intensity or vigorous-intensity exercise each week. This could be brisk walking, biking, or water aerobics.  ? Stretching and doing strength exercises, such as yoga or weightlifting, at least 2 times a week.  · Take medicines as told by your health care provider.  · Do not use any products that contain nicotine or tobacco, such as cigarettes and e-cigarettes. If you need help quitting, ask your health care provider.  · Work with a counselor or diabetes educator to identify strategies to manage stress and any emotional and social challenges.  Questions to ask a health care provider  · Do I need to meet with a diabetes educator?  · Do I need to meet with a dietitian?  · What number can I call if I have questions?  · When are the best times to check my blood glucose?  Where to find more  information:  · American Diabetes Association: diabetes.org  · Academy of Nutrition and Dietetics: www.eatright.org  · National Institute of Diabetes and Digestive and Kidney Diseases (NIH): www.niddk.nih.gov  Summary  · A healthy meal plan will help you control your blood glucose and maintain a healthy lifestyle.  · Working with a diet and nutrition specialist (dietitian) can help you make a meal plan that is best for you.  · Keep in mind that carbohydrates (carbs) and alcohol have immediate effects on your blood glucose levels. It is important to count carbs and to use alcohol carefully.  This information is not intended to   replace advice given to you by your health care provider. Make sure you discuss any questions you have with your health care provider.  Document Released: 11/24/2004 Document Revised: 09/27/2016 Document Reviewed: 04/03/2016  [employer]vier Interactive Patient Education © 2019 [employer]

## 2018-03-23 LAB — CUP PACEART REMOTE DEVICE CHECK
Date Time Interrogation Session: 20191219033643
MDC IDC PG IMPLANT DT: 20190227

## 2018-03-25 MED FILL — ATORVASTATIN 80 MG TABLET: 80 | 30 days supply | Qty: 30 | Fill #0

## 2018-03-25 MED FILL — GABAPENTIN 300 MG CAPSULE: 300 | 30 days supply | Qty: 30 | Fill #0

## 2018-03-25 MED FILL — !LANTUS 100 UNITS/ML VIAL: 100 | 26 days supply | Qty: 20 | Fill #0

## 2018-03-25 MED FILL — ISOSORBIDE MN ER 60 MG TAB: 60 | 30 days supply | Qty: 30 | Fill #0

## 2018-03-25 MED FILL — LISINOPRIL 10 MG TABS: 10 | 30 days supply | Qty: 30 | Fill #0

## 2018-03-25 MED FILL — CLOPIDOGREL 75 MG TABLET: 75 | 30 days supply | Qty: 30 | Fill #0

## 2018-03-25 MED FILL — TRUEPLUS SYR 0.5ML 31GX5/16: 31G X 5/16" | 60 days supply | Qty: 60 | Fill #0

## 2018-03-25 MED FILL — AMLODIPINE BESYLATE 10 MG T: 10 | 30 days supply | Qty: 30 | Fill #0

## 2018-04-01 ENCOUNTER — Ambulatory Visit (INDEPENDENT_AMBULATORY_CARE_PROVIDER_SITE_OTHER): Payer: Self-pay

## 2018-04-01 DIAGNOSIS — I639 Cerebral infarction, unspecified: Secondary | ICD-10-CM

## 2018-04-02 LAB — CUP PACEART REMOTE DEVICE CHECK
Date Time Interrogation Session: 20200121051111
MDC IDC PG IMPLANT DT: 20190227

## 2018-04-02 NOTE — Progress Notes (Signed)
Carelink Summary Report / Loop Recorder 

## 2018-04-10 ENCOUNTER — Other Ambulatory Visit: Payer: Self-pay | Admitting: Cardiology

## 2018-05-06 ENCOUNTER — Encounter: Payer: Self-pay | Admitting: *Deleted

## 2018-05-13 ENCOUNTER — Other Ambulatory Visit: Payer: Self-pay | Admitting: Cardiology

## 2018-05-13 MED FILL — AMLODIPINE BESYLATE 10 MG T: 10 | 30 days supply | Qty: 30 | Fill #1

## 2018-05-13 MED FILL — LISINOPRIL 10 MG TABS: 10 | 30 days supply | Qty: 30 | Fill #1

## 2018-05-13 MED FILL — GABAPENTIN 300 MG CAPSULE: 300 | 30 days supply | Qty: 30 | Fill #1

## 2018-05-13 MED FILL — ISOSORBIDE MN ER 60 MG TAB: 60 | 30 days supply | Qty: 30 | Fill #1

## 2018-05-13 MED FILL — !LANTUS 100 UNITS/ML VIAL: 100 | 26 days supply | Qty: 20 | Fill #1

## 2018-05-13 MED FILL — ATORVASTATIN 80 MG TABLET: 80 | 30 days supply | Qty: 30 | Fill #1

## 2018-05-13 MED FILL — CLOPIDOGREL 75 MG TABLET: 75 | 30 days supply | Qty: 30 | Fill #1

## 2018-06-14 MED FILL — AMLODIPINE BESYLATE 10 MG T: 10 | 30 days supply | Qty: 30 | Fill #2

## 2018-06-14 MED FILL — ATORVASTATIN 80 MG TABLET: 80 | 30 days supply | Qty: 30 | Fill #2

## 2018-06-14 MED FILL — CLOPIDOGREL 75 MG TABLET: 75 | 30 days supply | Qty: 30 | Fill #2

## 2018-06-14 MED FILL — LISINOPRIL 10 MG TABS: 10 | 30 days supply | Qty: 30 | Fill #2

## 2018-06-14 MED FILL — ISOSORBIDE MN ER 60 MG TAB: 60 | 30 days supply | Qty: 30 | Fill #2

## 2018-06-14 MED FILL — GABAPENTIN 300 MG CAPSULE: 300 | 30 days supply | Qty: 30 | Fill #2

## 2018-06-17 ENCOUNTER — Ambulatory Visit: Payer: Self-pay | Admitting: Family Medicine

## 2018-07-26 MED FILL — ISOSORBIDE MN ER 60 MG TAB: 60 | 30 days supply | Qty: 30 | Fill #3

## 2018-07-26 MED FILL — CLOPIDOGREL 75 MG TABLET: 75 | 30 days supply | Qty: 30 | Fill #3

## 2018-07-26 MED FILL — ?AMLODIPINE BESYLATE 10 MG: 10 | 30 days supply | Qty: 30 | Fill #3

## 2018-07-26 MED FILL — GABAPENTIN 300 MG CAPSULE: 300 | 30 days supply | Qty: 30 | Fill #3

## 2018-07-26 MED FILL — ?ATORVASTATIN 40MG TABLET: 40 | 30 days supply | Qty: 60 | Fill #0

## 2018-07-26 MED FILL — LISINOPRIL 10 MG TABS: 10 | 30 days supply | Qty: 30 | Fill #3

## 2018-09-18 MED FILL — ISOSORBIDE MN ER 60 MG TAB: 60 | 30 days supply | Qty: 30 | Fill #4

## 2018-09-18 MED FILL — CLOPIDOGREL 75 MG TABLET: 75 | 30 days supply | Qty: 30 | Fill #4

## 2018-09-18 MED FILL — ATORVASTATIN CALCIUM 40 MG: 40 | 30 days supply | Qty: 60 | Fill #1

## 2018-09-18 MED FILL — AMLODIPINE BESYLATE 10 MG T: 10 | 30 days supply | Qty: 30 | Fill #4

## 2018-09-18 MED FILL — LISINOPRIL 10 MG TABS: 10 | 30 days supply | Qty: 30 | Fill #4

## 2018-09-18 MED FILL — GABAPENTIN 300 MG CAPSULE: 300 | 30 days supply | Qty: 30 | Fill #4

## 2018-09-18 MED FILL — TRUEPLUS SYR 0.5ML 31GX5/16: 31G X 5/16" | 60 days supply | Qty: 60 | Fill #1

## 2018-10-25 MED FILL — ?AMLODIPINE BESYLATE 10 MG: 10 | 30 days supply | Qty: 30 | Fill #5

## 2018-10-25 MED FILL — !LANTUS 100 UNITS/ML VIAL: 100 | 26 days supply | Qty: 20 | Fill #2

## 2018-10-25 MED FILL — ISOSORBIDE MN ER 60 MG TAB: 60 | 30 days supply | Qty: 30 | Fill #5

## 2018-10-25 MED FILL — LISINOPRIL 10 MG TABS: 10 | 30 days supply | Qty: 30 | Fill #5

## 2018-10-25 MED FILL — ?ATORVASTATIN 40MG TABLET: 40 | 30 days supply | Qty: 60 | Fill #2

## 2018-10-25 MED FILL — GABAPENTIN 300 MG CAPSULE: 300 | 30 days supply | Qty: 30 | Fill #5

## 2018-10-25 MED FILL — CLOPIDOGREL 75 MG TABLET: 75 | 30 days supply | Qty: 30 | Fill #5

## 2018-12-05 MED FILL — LISINOPRIL 10 MG TABS: 10 | 30 days supply | Qty: 30 | Fill #3

## 2018-12-05 MED FILL — ISOSORBIDE MN ER 60 MG TAB: 60 | 30 days supply | Qty: 30 | Fill #3

## 2018-12-05 MED FILL — GABAPENTIN 300 MG CAPSULE: 300 | 30 days supply | Qty: 30 | Fill #3

## 2018-12-05 MED FILL — ?AMLODIPINE BESYLATE 10 MG: 10 | 30 days supply | Qty: 30 | Fill #3

## 2018-12-05 MED FILL — CLOPIDOGREL 75 MG TABLET: 75 | 30 days supply | Qty: 30 | Fill #3

## 2018-12-05 MED FILL — TRUEPLUS SYR 0.5ML 31GX5/16: 31G X 5/16" | 50 days supply | Qty: 100 | Fill #2

## 2018-12-05 MED FILL — !LANTUS 100 UNITS/ML VIAL: 100 | 26 days supply | Qty: 20 | Fill #3

## 2018-12-30 ENCOUNTER — Ambulatory Visit: Payer: Self-pay | Attending: Family Medicine | Admitting: Internal Medicine

## 2018-12-30 ENCOUNTER — Other Ambulatory Visit: Payer: Self-pay

## 2018-12-30 ENCOUNTER — Encounter: Payer: Self-pay | Admitting: Internal Medicine

## 2018-12-30 VITALS — BP 177/82 | HR 60 | Temp 98.6°F | Resp 16 | Wt 220.4 lb

## 2018-12-30 DIAGNOSIS — Z2821 Immunization not carried out because of patient refusal: Secondary | ICD-10-CM

## 2018-12-30 DIAGNOSIS — Z794 Long term (current) use of insulin: Secondary | ICD-10-CM

## 2018-12-30 DIAGNOSIS — I10 Essential (primary) hypertension: Secondary | ICD-10-CM

## 2018-12-30 DIAGNOSIS — E1169 Type 2 diabetes mellitus with other specified complication: Secondary | ICD-10-CM

## 2018-12-30 DIAGNOSIS — I6340 Cerebral infarction due to embolism of unspecified cerebral artery: Secondary | ICD-10-CM

## 2018-12-30 DIAGNOSIS — E1142 Type 2 diabetes mellitus with diabetic polyneuropathy: Secondary | ICD-10-CM

## 2018-12-30 DIAGNOSIS — E7800 Pure hypercholesterolemia, unspecified: Secondary | ICD-10-CM

## 2018-12-30 LAB — POCT GLYCOSYLATED HEMOGLOBIN (HGB A1C): HbA1c, POC (controlled diabetic range): 6.7 % (ref 0.0–7.0)

## 2018-12-30 LAB — GLUCOSE, POCT (MANUAL RESULT ENTRY): POC Glucose: 125 mg/dl — AB (ref 70–99)

## 2018-12-30 MED ORDER — INSULIN GLARGINE 100 UNIT/ML ~~LOC~~ SOLN: 32.0000 [IU] | Freq: Two times a day (BID) | SUBCUTANEOUS | 6 refills | Status: DC

## 2018-12-30 MED ORDER — ATORVASTATIN CALCIUM 80 MG PO TABS: 80.0000 mg | ORAL_TABLET | Freq: Every day | ORAL | 1 refills | Status: DC

## 2018-12-30 MED ORDER — GABAPENTIN 300 MG PO CAPS: 300.0000 mg | ORAL_CAPSULE | Freq: Every day | ORAL | 1 refills | Status: DC

## 2018-12-30 MED ORDER — AMLODIPINE BESYLATE 10 MG PO TABS: 10.0000 mg | ORAL_TABLET | Freq: Every day | ORAL | 1 refills | Status: DC

## 2018-12-30 MED ORDER — LISINOPRIL 10 MG PO TABS: 10.0000 mg | ORAL_TABLET | Freq: Every day | ORAL | 1 refills | Status: DC

## 2018-12-30 MED ORDER — CLOPIDOGREL BISULFATE 75 MG PO TABS: 75.0000 mg | ORAL_TABLET | Freq: Every day | ORAL | 1 refills | Status: DC

## 2018-12-30 MED ORDER — TRUE METRIX BLOOD GLUCOSE TEST VI STRP: ORAL_STRIP | 12 refills | Status: AC

## 2018-12-30 MED ORDER — ISOSORBIDE MONONITRATE ER 60 MG PO TB24: 60.0000 mg | ORAL_TABLET | Freq: Every day | ORAL | 1 refills | Status: DC

## 2018-12-30 NOTE — Progress Notes (Signed)
Patient ID: Meghan Ford, female    DOB: 09/30/1940  MRN: [id number]  CC: Diabetes and Hypertension   Subjective: Meghan Ford is a 78 y.o. female who presents for chronic disease management.  PCP is Dr. Margarita Ford who she last saw 03/2018.  Granddaughter, Meghan Ford, is with her.   Her concerns today include:  type 2 diabetes mellitus (A1c 8.8), diabetic neuropathy, hypertension, Hyperlipidemia, previous noncompliance, right PCA stroke (s/p loop recorder)   DIABETES TYPE 2 Last A1C:   Results for orders placed or performed in visit on 12/30/18  POCT glucose (manual entry)  Result Value Ref Range   POC Glucose 125 (A) 70 - 99 mg/dl  POCT glycosylated hemoglobin (Hb A1C)  Result Value Ref Range   Hemoglobin A1C     HbA1c POC (<> result, manual entry)     HbA1c, POC (prediabetic range)     HbA1c, POC (controlled diabetic range) 6.7 0.0 - 7.0 %    Med Adherence:  '[x]'$  Yes    '[]'$  No Medication side effects:  '[]'$  Yes    '[]'$  No Home Monitoring?  '[x]'$  Yes -  1-4 x a day   '[]'$  No Home glucose results range: reports frequent low BS recently.  Having to eat chocolate and drink juice to get it up.  Does not have log or glucometer with her.  Reports low BS as low as 35.  Low BS occur 2 x a wk.  On Lantus 35 units BID.  Her PCP had increases to 38 units BID when she saw her 03/2018  HTN:  BP elev.  Reports she has been out of meds since yesterday.  Last Rfs were given in 03/2018 with 5 refills which indicates she would have ran out several mths ago She denies any chest pains or shortness of breath.  No headaches or dizziness.  HL: Needing refill on Lipitor.  Patient Active Problem List   Diagnosis Date Noted  . Hydronephrosis concurrent with and due to calculi of kidney and ureter   . Herpes labialis 06/18/2017  . Pyelonephritis   . Renal stone   . Abdominal pain, right lateral 06/16/2017  . Anemia 06/16/2017  . Insulin dependent diabetes mellitus 06/15/2017  .  History of CVA (cerebrovascular accident) 05/07/2017  . Stroke ([place]) 05/07/2017  . Intracranial vascular stenosis   . TIA (transient ischemic attack) 11/07/2016  . Insulin dependent type 2 diabetes mellitus, uncontrolled ([place]) 11/07/2016  . HLD (hyperlipidemia) 11/07/2016  . HTN (hypertension) 11/07/2016  . 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 Noncompliance with diet and medication regimen 08/26/2015  . S/p reverse total shoulder arthroplasty   . Leukocytosis   . Proximal humerus fracture 10/26/2014  . Hyperlipidemia 10/23/2014  . Hypokalemia 10/09/2014  . Essential hypertension 10/09/2014  . Protein-calorie malnutrition, severe ([place]) 09/29/2014  . Severe sepsis ([place]) 09/28/2014  . Fever 09/28/2014  . Headache 09/28/2014  . Diabetes mellitus with neuropathy ([place])   . Pyrexia   . Uncontrolled hypertension 10/15/2012  . Neuropathic pain of both legs 10/15/2012  . Palpitations 05/21/2012  . Dyslipidemia 05/15/2012  . Hypertension   . Hyperlipidemia LDL goal < 100   . Diabetes mellitus type II, uncontrolled ([place])   . Varicose veins of lower extremities with other complications 89/21/1939     Current Outpatient Medications on File Prior to Visit  Medication Sig Dispense Refill  . aspirin 81 MG tablet Take 4 tablets (325 mg total) by mouth  daily. 30 tablet 11  . blood glucose meter kit and supplies KIT Dispense based on patient and insurance preference. Use up to four times daily as directed. (FOR ICD-9 250.00, 250.01). 1 each 0  . Blood Glucose Monitoring Suppl (TRUE METRIX METER) DEVI 1 each by Does not apply route 3 (three) times daily before meals. 1 Device 0  . glucose blood test strip Use as instructed 100 each 12  . Insulin Syringe-Needle U-100 (BD INSULIN SYRINGE ULTRAFINE) 31G X 15/64" 0.5 ML MISC 1 each by Does not apply route daily. 60 each 6  . Insulin Syringes, Disposable, U-100 0.3 ML MISC USE AS DIRECTED 100 each 11   . senna-docusate (SENOKOT-S) 8.6-50 MG tablet Take 1 tablet by mouth at bedtime as needed for mild constipation. 10 tablet 0  . tamsulosin (FLOMAX) 0.4 MG CAPS capsule Take 1 capsule (0.4 mg total) by mouth at bedtime. (Patient not taking: Reported on 12/13/2017) 14 capsule 0  . TRUEPLUS LANCETS 28G MISC 1 each by Does not apply route 3 (three) times daily before meals. 100 each 12   No current facility-administered medications on file prior to visit.     No Known Allergies  Social History   Socioeconomic History  . Marital status: Single    Spouse name: Not on file  . Number of children: Not on file  . Years of education: Not on file  . Highest education level: Not on file  Occupational History  . Not on file  Social Needs  . Financial resource strain: Not on file  . Food insecurity    Worry: Not on file    Inability: Not on file  . Transportation needs    Medical: Not on file    Non-medical: Not on file  Tobacco Use  . Smoking status: Never Smoker  . Smokeless tobacco: Never Used  Substance and Sexual Activity  . Alcohol use: No  . Drug use: Never  . Sexual activity: Not on file  Lifestyle  . Physical activity    Days per week: Not on file    Minutes per session: Not on file  . Stress: Not on file  Relationships  . Social Herbalist on phone: Not on file    Gets together: Not on file    Attends religious service: Not on file    Active member of club or organization: Not on file    Attends meetings of clubs or organizations: Not on file    Relationship status: Not on file  . Intimate partner violence    Fear of current or ex partner: Not on file    Emotionally abused: Not on file    Physically abused: Not on file    Forced sexual activity: Not on file  Other Topics Concern  . Not on file  Social History Narrative   ** Merged History Encounter **       Lives with son Meghan Ford who comes with her to majority of visits and helps coordinate care for  her. He translates for her and has signed and patient signed a release for this on 02/02/12.           Family History  Problem Relation Age of Onset  . Cancer Brother   . Heart disease Sister     Past Surgical History:  Procedure Laterality Date  . FRACTURE SURGERY Left 2017   Shoulder  . HOLMIUM LASER APPLICATION Left 09/15/2261   Procedure: HOLMIUM LASER APPLICATION;  Surgeon: Cleon Gustin, MD;  Location: [facility];  Service: Urology;  Laterality: Left;  . LOOP RECORDER INSERTION N/A 05/09/2017   Procedure: LOOP RECORDER INSERTION;  Surgeon: Constance Haw, MD;  Location: [facility];  Service: Cardiovascular;  Laterality: N/A;  . NEPHROLITHOTOMY Left 09/18/2017   Procedure: CYSTOSCOPY/LEFT URETEROSCOPIC STONE EXTRACTION/LEFT RETROGRADE/ STENT PLACEMENT;  Surgeon: Cleon Gustin, MD;  Location: [facility];  Service: Urology;  Laterality: Left;  . REVERSE SHOULDER ARTHROPLASTY Left 10/27/2014   Procedure: REVERSE SHOULDER ARTHROPLASTY;  Surgeon: Renette Butters, MD;  Location: [place];  Service: Orthopedics;  Laterality: Left;  . TEE WITHOUT CARDIOVERSION N/A 05/09/2017   Procedure: TRANSESOPHAGEAL ECHOCARDIOGRAM (TEE);  Surgeon: Jerline Pain, MD;  Location[facility];  Service: Cardiovascular;  Laterality: N/A;  . TOTAL SHOULDER ARTHROPLASTY Left 10/27/2014   Procedure: TOTAL SHOULDER ARTHROPLASTY;  Surgeon: Renette Butters, MD;  Location: [place];  Service: Orthopedics;  Laterality: Left;  Marland Kitchen VEIN SURGERY Right 1998?   right leg    ROS: Review of Systems Negative except as stated above  PHYSICAL EXAM: BP (!) 177/82   Pulse 60   Temp 98.6 F (37 C) (Oral)   Resp 16   Wt 220 lb 6.4 oz (100 kg)   SpO2 99%   BMI 37.83 kg/m   Physical Exam  General appearance - alert, well appearing, elderly Hispanic female and in no distress Mental status - normal mood, behavior, speech, dress, motor activity, and thought processes Neck - supple, no significant adenopathy  Chest - clear to auscultation, no wheezes, rales or rhonchi, symmetric air entry Heart - normal rate, regular rhythm, normal S1, S2, no murmurs, rubs, clicks or gallops Extremities -trace lower extremity edema.  CMP Latest Ref Rng & Units 09/14/2017 08/09/2017 07/17/2017  Glucose 70 - 99 mg/dL 179(H) 131(H) 145(H)  BUN 8 - 23 mg/dL 30(H) 25(H) 23(H)  Creatinine 0.44 - 1.00 mg/dL 1.04(H) 0.82 0.95  Sodium 135 - 145 mmol/L 139 141 139  Potassium 3.5 - 5.1 mmol/L 4.9 4.5 4.0  Chloride 98 - 111 mmol/L 102 106 105  CO2 22 - 32 mmol/L '27 25 26  '$ Calcium 8.9 - 10.3 mg/dL 9.4 9.5 8.7(L)  Total Protein 6.5 - 8.1 g/dL - - 6.4(L)  Total Bilirubin 0.3 - 1.2 mg/dL - - 0.8  Alkaline Phos 38 - 126 U/L - - 68  AST 15 - 41 U/L - - 19  ALT 14 - 54 U/L - - 13(L)   Lipid Panel     Component Value Date/Time   CHOL 122 05/08/2017 0503   TRIG 179 (H) 05/08/2017 0503   HDL 28 (L) 05/08/2017 0503   CHOLHDL 4.4 05/08/2017 0503   VLDL 36 05/08/2017 0503   LDLCALC 58 05/08/2017 0503    CBC    Component Value Date/Time   WBC 10.2 09/14/2017 1200   RBC 5.59 (H) 09/14/2017 1200   HGB 14.3 09/14/2017 1200   HCT 45.0 09/14/2017 1200   PLT 266 09/14/2017 1200   MCV 80.5 09/14/2017 1200   MCV 85.2 12/11/2011 1442   MCH 25.6 (L) 09/14/2017 1200   MCHC 31.8 09/14/2017 1200   RDW 15.0 09/14/2017 1200   LYMPHSABS 1.4 07/17/2017 0627   MONOABS 0.5 07/17/2017 0627   EOSABS 0.2 07/17/2017 0627   BASOSABS 0.0 07/17/2017 0627    ASSESSMENT AND PLAN: 1. Type 2 diabetes mellitus with diabetic polyneuropathy, with long-term current use of insulin ([place]) At goal.  However patient reports frequent  hypoglycemia.  Advised that she decrease Lantus from 35 units to 32 units twice a day.  Call if she continues to have low blood sugar episodes.  Will arrange short-term follow-up with her PCP in 1 month. - POCT glucose (manual entry) - POCT glycosylated hemoglobin (Hb A1C) - Microalbumin / creatinine urine ratio - CBC -  Comprehensive metabolic panel - Lipid panel - insulin glargine (LANTUS) 100 UNIT/ML injection; Inject 0.32 mLs (32 Units total) into the skin 2 (two) times daily.  Dispense: 30 mL; Refill: 6 - gabapentin (NEURONTIN) 300 MG capsule; Take 1 capsule (300 mg total) by mouth at bedtime.  Dispense: 30 capsule; Refill: 1  2. Essential hypertension Not at goal.  Patient out of her medications.  Refills given.  Advised to call if she runs out of medicines between appointments as uncontrolled blood pressure puts her at risk for having another stroke - lisinopril (ZESTRIL) 10 MG tablet; Take 1 tablet (10 mg total) by mouth daily.  Dispense: 30 tablet; Refill: 1 - amLODipine (NORVASC) 10 MG tablet; Take 1 tablet (10 mg total) by mouth daily.  Dispense: 30 tablet; Refill: 1 - isosorbide mononitrate (IMDUR) 60 MG 24 hr tablet; Take 1 tablet (60 mg total) by mouth daily.  Dispense: 30 tablet; Refill: 1  3. Pure hypercholesterolemia - atorvastatin (LIPITOR) 80 MG tablet; Take 1 tablet (80 mg total) by mouth daily.  Dispense: 30 tablet; Refill: 1  4. Cerebrovascular accident (CVA) due to embolism of cerebral artery (HCC) - clopidogrel (PLAVIX) 75 MG tablet; Take 1 tablet (75 mg total) by mouth daily.  Dispense: 30 tablet; Refill: 1  5. Pneumococcal vaccination declined by patient 6. Influenza vaccination declined     Patient was given the opportunity to ask questions.  Patient verbalized understanding of the plan and was able to repeat key elements of the plan.  [employer] interpreter used during this encounter. #[id number]   Orders Placed This Encounter  Procedures  . Microalbumin / creatinine urine ratio  . CBC  . Comprehensive metabolic panel  . Lipid panel  . POCT glucose (manual entry)  . POCT glycosylated hemoglobin (Hb A1C)     Requested Prescriptions   Signed Prescriptions Disp Refills  . glucose blood (TRUE METRIX BLOOD GLUCOSE TEST) test strip 100 each 12    Sig: Use 3 times daily before  meals  . lisinopril (ZESTRIL) 10 MG tablet 30 tablet 1    Sig: Take 1 tablet (10 mg total) by mouth daily.  Marland Kitchen amLODipine (NORVASC) 10 MG tablet 30 tablet 1    Sig: Take 1 tablet (10 mg total) by mouth daily.  . insulin glargine (LANTUS) 100 UNIT/ML injection 30 mL 6    Sig: Inject 0.32 mLs (32 Units total) into the skin 2 (two) times daily.  . isosorbide mononitrate (IMDUR) 60 MG 24 hr tablet 30 tablet 1    Sig: Take 1 tablet (60 mg total) by mouth daily.  . clopidogrel (PLAVIX) 75 MG tablet 30 tablet 1    Sig: Take 1 tablet (75 mg total) by mouth daily.  Marland Kitchen atorvastatin (LIPITOR) 80 MG tablet 30 tablet 1    Sig: Take 1 tablet (80 mg total) by mouth daily.  Marland Kitchen gabapentin (NEURONTIN) 300 MG capsule 30 capsule 1    Sig: Take 1 capsule (300 mg total) by mouth at bedtime.    Return in about 1 month (around 01/30/2019) for Dr. Margarita Ford.  Karle Plumber, MD, FACP

## 2018-12-31 LAB — COMPREHENSIVE METABOLIC PANEL
ALT: 10 IU/L (ref 0–32)
AST: 14 IU/L (ref 0–40)
Albumin/Globulin Ratio: 1.3 (ref 1.2–2.2)
Albumin: 3.6 g/dL — ABNORMAL LOW (ref 3.7–4.7)
Alkaline Phosphatase: 93 IU/L (ref 39–117)
BUN/Creatinine Ratio: 23 (ref 12–28)
BUN: 24 mg/dL (ref 8–27)
Bilirubin Total: 0.4 mg/dL (ref 0.0–1.2)
CO2: 24 mmol/L (ref 20–29)
Calcium: 9.4 mg/dL (ref 8.7–10.3)
Chloride: 105 mmol/L (ref 96–106)
Creatinine, Ser: 1.06 mg/dL — ABNORMAL HIGH (ref 0.57–1.00)
GFR calc Af Amer: 59 mL/min/{1.73_m2} — ABNORMAL LOW (ref >59)
GFR calc non Af Amer: 51 mL/min/{1.73_m2} — ABNORMAL LOW (ref >59)
Globulin, Total: 2.8 g/dL (ref 1.5–4.5)
Glucose: 115 mg/dL — ABNORMAL HIGH (ref 65–99)
Potassium: 4.9 mmol/L (ref 3.5–5.2)
Sodium: 143 mmol/L (ref 134–144)
Total Protein: 6.4 g/dL (ref 6.0–8.5)

## 2018-12-31 LAB — LIPID PANEL
Chol/HDL Ratio: 4.2 ratio (ref 0.0–4.4)
Cholesterol, Total: 243 mg/dL — ABNORMAL HIGH (ref 100–199)
HDL: 58 mg/dL (ref >39)
LDL Chol Calc (NIH): 158 mg/dL — ABNORMAL HIGH (ref 0–99)
Triglycerides: 153 mg/dL — ABNORMAL HIGH (ref 0–149)
VLDL Cholesterol Cal: 27 mg/dL (ref 5–40)

## 2018-12-31 LAB — CBC
Hematocrit: 45.2 % (ref 34.0–46.6)
Hemoglobin: 13.9 g/dL (ref 11.1–15.9)
MCH: 25.3 pg — ABNORMAL LOW (ref 26.6–33.0)
MCHC: 30.8 g/dL — ABNORMAL LOW (ref 31.5–35.7)
MCV: 82 fL (ref 79–97)
Platelets: 297 10*3/uL (ref 150–450)
RBC: 5.49 x10E6/uL — ABNORMAL HIGH (ref 3.77–5.28)
RDW: 14.2 % (ref 11.7–15.4)
WBC: 7.6 10*3/uL (ref 3.4–10.8)

## 2019-01-07 ENCOUNTER — Telehealth: Payer: Self-pay

## 2019-01-07 NOTE — Telephone Encounter (Signed)
[employer]  [place] Id# [id number]  contacted pt to go over lab results pt didn't answer left a detailed vm informing pt of results and if she has any questions or concerns to give me a call   

## 2019-01-22 ENCOUNTER — Ambulatory Visit: Payer: Self-pay | Admitting: Family Medicine

## 2019-02-03 ENCOUNTER — Ambulatory Visit: Payer: Self-pay | Admitting: Family Medicine

## 2019-02-20 ENCOUNTER — Other Ambulatory Visit: Payer: Self-pay | Admitting: Family Medicine

## 2019-02-20 DIAGNOSIS — Z794 Long term (current) use of insulin: Secondary | ICD-10-CM

## 2019-02-20 DIAGNOSIS — I6340 Cerebral infarction due to embolism of unspecified cerebral artery: Secondary | ICD-10-CM

## 2019-02-20 DIAGNOSIS — I10 Essential (primary) hypertension: Secondary | ICD-10-CM

## 2019-02-20 DIAGNOSIS — E1142 Type 2 diabetes mellitus with diabetic polyneuropathy: Secondary | ICD-10-CM

## 2019-02-20 MED FILL — ?AMLODIPINE BESYLATE 10 MG: 10 | 30 days supply | Qty: 30 | Fill #0

## 2019-02-20 MED FILL — LISINOPRIL 10 MG TABS: 10 | 30 days supply | Qty: 30 | Fill #0

## 2019-02-20 MED FILL — CLOPIDOGREL 75 MG TABLET: 75 | 30 days supply | Qty: 30 | Fill #0

## 2019-02-20 MED FILL — ?ATORVASTATIN 40MG TABLET: 40 | 30 days supply | Qty: 60 | Fill #0

## 2019-02-20 MED FILL — !LANTUS 100 UNITS/ML VIAL: 100 | 26 days supply | Qty: 20 | Fill #4

## 2019-02-20 MED FILL — ISOSORBIDE MN ER 60 MG TAB: 60 | 30 days supply | Qty: 30 | Fill #0

## 2019-02-20 MED FILL — TRUEPLUS SYR 0.5ML 31GX5/16: 31G X 5/16" | 50 days supply | Qty: 100 | Fill #3

## 2019-02-21 MED FILL — GABAPENTIN 300 MG CAPSULE: 300 | 30 days supply | Qty: 30 | Fill #0

## 2019-03-27 ENCOUNTER — Other Ambulatory Visit: Payer: Self-pay

## 2019-03-27 ENCOUNTER — Emergency Department (HOSPITAL_BASED_OUTPATIENT_CLINIC_OR_DEPARTMENT_OTHER): Payer: Self-pay

## 2019-03-27 ENCOUNTER — Inpatient Hospital Stay (HOSPITAL_BASED_OUTPATIENT_CLINIC_OR_DEPARTMENT_OTHER)
Admission: EM | Admit: 2019-03-27 | Discharge: 2019-03-29 | DRG: 378 | Disposition: A | Discharge status: Home / Self Care | Payer: Self-pay | Attending: Internal Medicine | Admitting: Internal Medicine

## 2019-03-27 ENCOUNTER — Encounter (HOSPITAL_BASED_OUTPATIENT_CLINIC_OR_DEPARTMENT_OTHER): Payer: Self-pay | Admitting: *Deleted

## 2019-03-27 DIAGNOSIS — D649 Anemia, unspecified: Secondary | ICD-10-CM

## 2019-03-27 DIAGNOSIS — Z8673 Personal history of transient ischemic attack (TIA), and cerebral infarction without residual deficits: Secondary | ICD-10-CM

## 2019-03-27 DIAGNOSIS — I10 Essential (primary) hypertension: Secondary | ICD-10-CM | POA: Diagnosis present

## 2019-03-27 DIAGNOSIS — Z20822 Contact with and (suspected) exposure to covid-19: Secondary | ICD-10-CM | POA: Diagnosis present

## 2019-03-27 DIAGNOSIS — H538 Other visual disturbances: Secondary | ICD-10-CM | POA: Diagnosis present

## 2019-03-27 DIAGNOSIS — Z79899 Other long term (current) drug therapy: Secondary | ICD-10-CM

## 2019-03-27 DIAGNOSIS — K635 Polyp of colon: Secondary | ICD-10-CM | POA: Diagnosis present

## 2019-03-27 DIAGNOSIS — D62 Acute posthemorrhagic anemia: Secondary | ICD-10-CM | POA: Diagnosis present

## 2019-03-27 DIAGNOSIS — E1165 Type 2 diabetes mellitus with hyperglycemia: Secondary | ICD-10-CM | POA: Diagnosis present

## 2019-03-27 DIAGNOSIS — K5731 Diverticulosis of large intestine without perforation or abscess with bleeding: Principal | ICD-10-CM | POA: Diagnosis present

## 2019-03-27 DIAGNOSIS — Z8249 Family history of ischemic heart disease and other diseases of the circulatory system: Secondary | ICD-10-CM

## 2019-03-27 DIAGNOSIS — E785 Hyperlipidemia, unspecified: Secondary | ICD-10-CM | POA: Diagnosis present

## 2019-03-27 DIAGNOSIS — K922 Gastrointestinal hemorrhage, unspecified: Secondary | ICD-10-CM | POA: Diagnosis present

## 2019-03-27 DIAGNOSIS — B001 Herpesviral vesicular dermatitis: Secondary | ICD-10-CM | POA: Diagnosis present

## 2019-03-27 DIAGNOSIS — E1151 Type 2 diabetes mellitus with diabetic peripheral angiopathy without gangrene: Secondary | ICD-10-CM | POA: Diagnosis present

## 2019-03-27 DIAGNOSIS — IMO0002 Reserved for concepts with insufficient information to code with codable children: Secondary | ICD-10-CM | POA: Diagnosis present

## 2019-03-27 DIAGNOSIS — K625 Hemorrhage of anus and rectum: Secondary | ICD-10-CM

## 2019-03-27 DIAGNOSIS — I6340 Cerebral infarction due to embolism of unspecified cerebral artery: Secondary | ICD-10-CM

## 2019-03-27 DIAGNOSIS — Z7902 Long term (current) use of antithrombotics/antiplatelets: Secondary | ICD-10-CM

## 2019-03-27 DIAGNOSIS — E1142 Type 2 diabetes mellitus with diabetic polyneuropathy: Secondary | ICD-10-CM | POA: Diagnosis present

## 2019-03-27 DIAGNOSIS — Z794 Long term (current) use of insulin: Secondary | ICD-10-CM

## 2019-03-27 DIAGNOSIS — Z7982 Long term (current) use of aspirin: Secondary | ICD-10-CM

## 2019-03-27 DIAGNOSIS — Z96612 Presence of left artificial shoulder joint: Secondary | ICD-10-CM | POA: Diagnosis present

## 2019-03-27 LAB — CBC
HCT: 26.5 % — ABNORMAL LOW (ref 36.0–46.0)
Hemoglobin: 8 g/dL — ABNORMAL LOW (ref 12.0–15.0)
MCH: 25.4 pg — ABNORMAL LOW (ref 26.0–34.0)
MCHC: 30.2 g/dL (ref 30.0–36.0)
MCV: 84.1 fL (ref 80.0–100.0)
Platelets: 247 10*3/uL (ref 150–400)
RBC: 3.15 MIL/uL — ABNORMAL LOW (ref 3.87–5.11)
RDW: 15.6 % — ABNORMAL HIGH (ref 11.5–15.5)
WBC: 9.1 10*3/uL (ref 4.0–10.5)
nRBC: 0 % (ref 0.0–0.2)

## 2019-03-27 LAB — COMPREHENSIVE METABOLIC PANEL
ALT: 12 U/L (ref 0–44)
AST: 17 U/L (ref 15–41)
Albumin: 2.7 g/dL — ABNORMAL LOW (ref 3.5–5.0)
Alkaline Phosphatase: 57 U/L (ref 38–126)
Anion gap: 7 (ref 5–15)
BUN: 38 mg/dL — ABNORMAL HIGH (ref 8–23)
CO2: 25 mmol/L (ref 22–32)
Calcium: 8.1 mg/dL — ABNORMAL LOW (ref 8.9–10.3)
Chloride: 111 mmol/L (ref 98–111)
Creatinine, Ser: 1.28 mg/dL — ABNORMAL HIGH (ref 0.44–1.00)
GFR calc Af Amer: 47 mL/min — ABNORMAL LOW (ref >60)
GFR calc non Af Amer: 41 mL/min — ABNORMAL LOW (ref >60)
Glucose, Bld: 102 mg/dL — ABNORMAL HIGH (ref 70–99)
Potassium: 4.3 mmol/L (ref 3.5–5.1)
Sodium: 143 mmol/L (ref 135–145)
Total Bilirubin: 0.3 mg/dL (ref 0.3–1.2)
Total Protein: 5.2 g/dL — ABNORMAL LOW (ref 6.5–8.1)

## 2019-03-27 LAB — CBC WITH DIFFERENTIAL/PLATELET
Abs Immature Granulocytes: 0.03 10*3/uL (ref 0.00–0.07)
Basophils Absolute: 0 10*3/uL (ref 0.0–0.1)
Basophils Relative: 0 %
Eosinophils Absolute: 0.1 10*3/uL (ref 0.0–0.5)
Eosinophils Relative: 1 %
HCT: 29.3 % — ABNORMAL LOW (ref 36.0–46.0)
Hemoglobin: 8.8 g/dL — ABNORMAL LOW (ref 12.0–15.0)
Immature Granulocytes: 0 %
Lymphocytes Relative: 29 %
Lymphs Abs: 2.7 10*3/uL (ref 0.7–4.0)
MCH: 25 pg — ABNORMAL LOW (ref 26.0–34.0)
MCHC: 30 g/dL (ref 30.0–36.0)
MCV: 83.2 fL (ref 80.0–100.0)
Monocytes Absolute: 0.5 10*3/uL (ref 0.1–1.0)
Monocytes Relative: 5 %
Neutro Abs: 6 10*3/uL (ref 1.7–7.7)
Neutrophils Relative %: 65 %
Platelets: 264 10*3/uL (ref 150–400)
RBC: 3.52 MIL/uL — ABNORMAL LOW (ref 3.87–5.11)
RDW: 15.6 % — ABNORMAL HIGH (ref 11.5–15.5)
WBC: 9.3 10*3/uL (ref 4.0–10.5)
nRBC: 0 % (ref 0.0–0.2)

## 2019-03-27 LAB — PROTIME-INR
INR: 1 (ref 0.8–1.2)
Prothrombin Time: 13.2 seconds (ref 11.4–15.2)

## 2019-03-27 LAB — RESPIRATORY PANEL BY RT PCR (FLU A&B, COVID)
Influenza A by PCR: NEGATIVE
Influenza B by PCR: NEGATIVE
SARS Coronavirus 2 by RT PCR: NEGATIVE

## 2019-03-27 MED ORDER — SODIUM CHLORIDE 0.9 % IV BOLUS
1000.0000 mL | Freq: Once | INTRAVENOUS | Status: AC
  Administered 2019-03-27: 1000 mL via INTRAVENOUS

## 2019-03-27 MED ORDER — IOHEXOL 300 MG/ML  SOLN
100.0000 mL | Freq: Once | INTRAMUSCULAR | Status: AC | PRN
  Administered 2019-03-27: 80 mL via INTRAVENOUS

## 2019-03-27 MED ORDER — SODIUM CHLORIDE 0.9 % IV SOLN: INTRAVENOUS | Status: AC

## 2019-03-27 MED ORDER — ACETAMINOPHEN 650 MG RE SUPP: 650.0000 mg | Freq: Four times a day (QID) | RECTAL | Status: DC | PRN

## 2019-03-27 MED ORDER — ACETAMINOPHEN 325 MG PO TABS: 650.0000 mg | ORAL_TABLET | Freq: Four times a day (QID) | ORAL | Status: DC | PRN

## 2019-03-27 NOTE — ED Notes (Signed)
Report given to Suszanne Conners, receiving nurse at [facility]

## 2019-03-27 NOTE — ED Notes (Signed)
Report given to [facility], RN with [facility]

## 2019-03-27 NOTE — H&P (Addendum)
TRH H&P    Patient Demographics:    Meghan Ford, is a 79 y.o. female  MRN: [id number]  DOB - [birth year]  Admit Date - 03/27/2019  Referring MD/NP/PA: Trish Mage  Outpatient Primary MD for the patient is Charlott Rakes, MD  Patient coming from: home-> med center [facility]  Chief complaint- rectal bleeding   HPI:    Meghan Ford  is a 79 y.o. female,  Whypertension, hyperlipidemia, Dm2, PVD, h/o stroke, presents with c/o rectal bleeding since yesterday.  Pt notes slight diarrhea, loose stool as well as lower abdominal discomfort, bilaterally, mild.  No recent colonoscopy .   Pt denies fever, chills, cough, cp, palp, sob, n/v, epigastric, pain, constipation, black stool.  Pt presented to ED due to rectal bleeding.    In ED,  T 98.5 P 73  R 20, Bp; 154/68  Pox 10% on RA wt 90.7kg   CT abd/ pelvis IMPRESSION: 1. No acute abdominal or pelvic pathology. 2. Extensive colonic diverticulosis without evidence of diverticulitis. 3.  Aortic Atherosclerosis (ICD10-I70.0).  Na 143, K 4.3, Bun 38, Creatinine 1.28 Ast 17, Alt 12, Alb 2.7 Wbc 9.3, Hgb 8.8, Plt 264  Covid negative  Pt will be admitted for evaluation of rectal bleeding likely diverticular     Review of systems:    In addition to the HPI above,  No Fever-chills, No Headache, No changes with Vision or hearing, No problems swallowing food or Liquids, No Chest pain, Cough or Shortness of Breath,  No Nausea or Vomiting, bowel movements are regular, No Blood in   Urine, No dysuria, No new skin rashes or bruises, No new joints pains-aches,  No new weakness, tingling, numbness in any extremity, No recent weight gain or loss, No polyuria, polydypsia or polyphagia, No significant Mental Stressors.  All other systems reviewed and are negative.    Past History of the following :    Past Medical History:  Diagnosis  Date  . Arthritis    bil knees  . Blurred vision, left eye   . Blurry vision, bilateral   . Diabetes mellitus type II, uncontrolled ([place])   . Diabetes mellitus without complication ([place])   . Dysrhythmia    patient via intepreter reports at time of last stroke , her heartbeat was very irregular ; HR WDL today, patient denies any headache, chest pain, new numbness or new worsening vision since last stroke   . Fx humeral neck   . Gait disturbance   . History of kidney stones   . Hyperlipidemia LDL goal < 100   . Hypertension   . Left arm weakness    ambulates with cane   . Leg pain    worse with prolonged standing  . Peripheral vascular disease ([place])   . Pyelonephritis 06/14/2017  . Sepsis ([place]) 06/14/2017  . Status post placement of implantable loop recorder   . Stroke [facility]) 2018; 05/08/2017   denies residual from 2018 stroke on 05/08/2017; weakness on left; speech issues from today's stroke (05/08/2017)  . Varicose veins  Past Surgical History:  Procedure Laterality Date  . FRACTURE SURGERY Left 2017   Shoulder  . HOLMIUM LASER APPLICATION Left 06/19/6757   Procedure: HOLMIUM LASER APPLICATION;  Surgeon: Cleon Gustin, MD;  Location: [facility];  Service: Urology;  Laterality: Left;  . LOOP RECORDER INSERTION N/A 05/09/2017   Procedure: LOOP RECORDER INSERTION;  Surgeon: Constance Haw, MD;  Location: [place] [facility];  Service: Cardiovascular;  Laterality: N/A;  . NEPHROLITHOTOMY Left 09/18/2017   Procedure: CYSTOSCOPY/LEFT URETEROSCOPIC STONE EXTRACTION/LEFT RETROGRADE/ STENT PLACEMENT;  Surgeon: Cleon Gustin, MD;  Location: [facility];  Service: Urology;  Laterality: Left;  . REVERSE SHOULDER ARTHROPLASTY Left 10/27/2014   Procedure: REVERSE SHOULDER ARTHROPLASTY;  Surgeon: Renette Butters, MD;  Location: [place];  Service: Orthopedics;  Laterality: Left;  . TEE WITHOUT CARDIOVERSION N/A 05/09/2017   Procedure: TRANSESOPHAGEAL ECHOCARDIOGRAM (TEE);  Surgeon: Jerline Pain, MD;  Location: [facility] ENDOSCOPY;  Service: Cardiovascular;  Laterality: N/A;  . TOTAL SHOULDER ARTHROPLASTY Left 10/27/2014   Procedure: TOTAL SHOULDER ARTHROPLASTY;  Surgeon: Renette Butters, MD;  Location: [place];  Service: Orthopedics;  Laterality: Left;  Marland Kitchen VEIN SURGERY Right 1998?   right leg      Social History:      Social History   Tobacco Use  . Smoking status: Never Smoker  . Smokeless tobacco: Never Used  Substance Use Topics  . Alcohol use: No       Family History :     Family History  Problem Relation Age of Onset  . Cancer Brother   . Heart disease Sister        Home Medications:   Prior to Admission medications   Medication Sig Start Date End Date Taking? Authorizing Provider  amLODipine (NORVASC) 10 MG tablet TAKE 1 TABLET (10 MG TOTAL) BY MOUTH DAILY. 02/20/19   Charlott Rakes, MD  aspirin 81 MG tablet Take 4 tablets (325 mg total) by mouth daily. 06/26/17   Colbert Ewing, MD  atorvastatin (LIPITOR) 40 MG tablet TAKE 2 TABLETS BY MOUTH DAILY 02/20/19   Charlott Rakes, MD  blood glucose meter kit and supplies KIT Dispense based on patient and insurance preference. Use up to four times daily as directed. (FOR ICD-9 250.00, 250.01). 11/09/16   Hosie Poisson, MD  Blood Glucose Monitoring Suppl (TRUE METRIX METER) DEVI 1 each by Does not apply route 3 (three) times daily before meals. 05/14/17   Charlott Rakes, MD  clopidogrel (PLAVIX) 75 MG tablet TAKE 1 TABLET (75 MG TOTAL) BY MOUTH DAILY. 02/20/19   Charlott Rakes, MD  gabapentin (NEURONTIN) 300 MG capsule TAKE 1 CAPSULE (300 MG TOTAL) BY MOUTH AT BEDTIME. 02/21/19   Charlott Rakes, MD  glucose blood (TRUE METRIX BLOOD GLUCOSE TEST) test strip Use 3 times daily before meals 12/30/18   Ladell Pier, MD  glucose blood test strip Use as instructed 11/09/16   Hosie Poisson, MD  insulin glargine (LANTUS) 100 UNIT/ML injection Inject 0.32 mLs (32 Units total) into the skin 2 (two) times daily. 12/30/18    Ladell Pier, MD  Insulin Syringe-Needle U-100 (BD INSULIN SYRINGE ULTRAFINE) 31G X 15/64" 0.5 ML MISC 1 each by Does not apply route daily. 03/18/18   Charlott Rakes, MD  Insulin Syringes, Disposable, U-100 0.3 ML MISC USE AS DIRECTED 08/14/17   Charlott Rakes, MD  isosorbide mononitrate (IMDUR) 60 MG 24 hr tablet TAKE 1 TABLET (60 MG TOTAL) BY MOUTH DAILY. 02/20/19   Charlott Rakes, MD  lisinopril (  ZESTRIL) 10 MG tablet TAKE 1 TABLET (10 MG TOTAL) BY MOUTH DAILY. 02/20/19   Charlott Rakes, MD  senna-docusate (SENOKOT-S) 8.6-50 MG tablet Take 1 tablet by mouth at bedtime as needed for mild constipation. 05/09/17   Aline August, MD  tamsulosin (FLOMAX) 0.4 MG CAPS capsule Take 1 capsule (0.4 mg total) by mouth at bedtime. Patient not taking: Reported on 12/13/2017 09/18/17   Clydene Laming, Case M, MD  TRUEPLUS LANCETS 28G MISC 1 each by Does not apply route 3 (three) times daily before meals. 05/14/17   Charlott Rakes, MD     Allergies:    No Known Allergies   Physical Exam:   Vitals  Blood pressure (!) 148/80, pulse 78, temperature 98.5 F (36.9 C), temperature source Oral, resp. rate 18, height _0  (1.676 m), weight 90.7 kg, SpO2 100 %.  1.  General: axoxo3  2. Psychiatric: euthymic  3. Neurologic: nonfocal  4. HEENMT:  Anicteric, pale conjuctiva, pupils 1.42m symmetric, direct, consensual near intact Neck: no jvd  5. Respiratory : CTAB  6. Cardiovascular : rrr s1, s2, 2/6 sem   7. Gastrointestinal:  Abd: soft, nt, nd, +bs   8. Skin:  Ext: no c/c/e, no rash  9.Musculoskeletal:  Good ROM    Data Review:    CBC Recent Labs  Lab 03/27/19 1009  WBC 9.3  HGB 8.8*  HCT 29.3*  PLT 264  MCV 83.2  MCH 25.0*  MCHC 30.0  RDW 15.6*  LYMPHSABS 2.7  MONOABS 0.5  EOSABS 0.1  BASOSABS 0.0   ------------------------------------------------------------------------------------------------------------------  Results for orders placed or performed during the hospital  encounter of 03/27/19 (from the past 48 hour(s))  Comprehensive metabolic panel     Status: Abnormal   Collection Time: 03/27/19 10:09 AM  Result Value Ref Range   Sodium 143 135 - 145 mmol/L   Potassium 4.3 3.5 - 5.1 mmol/L   Chloride 111 98 - 111 mmol/L   CO2 25 22 - 32 mmol/L   Glucose, Bld 102 (H) 70 - 99 mg/dL   BUN 38 (H) 8 - 23 mg/dL   Creatinine, Ser 1.28 (H) 0.44 - 1.00 mg/dL   Calcium 8.1 (L) 8.9 - 10.3 mg/dL   Total Protein 5.2 (L) 6.5 - 8.1 g/dL   Albumin 2.7 (L) 3.5 - 5.0 g/dL   AST 17 15 - 41 U/L   ALT 12 0 - 44 U/L   Alkaline Phosphatase 57 38 - 126 U/L   Total Bilirubin 0.3 0.3 - 1.2 mg/dL   GFR calc non Af Amer 41 (L) >60 mL/min   GFR calc Af Amer 47 (L) >60 mL/min   Anion gap 7 5 - 15    Comment: [facility]formed [facility] [address] [id number] CBC with Differential     Status: Abnormal   Collection Time: 03/27/19 10:09 AM  Result Value Ref Range   WBC 9.3 4.0 - 10.5 K/uL   RBC 3.52 (L) 3.87 - 5.11 MIL/uL   Hemoglobin 8.8 (L) 12.0 - 15.0 g/dL   HCT 29.3 (L) 36.0 - 46.0 %   MCV 83.2 80.0 - 100.0 fL   MCH 25.0 (L) 26.0 - 34.0 pg   MCHC 30.0 30.0 - 36.0 g/dL   RDW 15.6 (H) 11.5 - 15.5 %   Platelets 264 150 - 400 K/uL   nRBC 0.0 0.0 - 0.2 %   Neutrophils Relative % 65 %   Neutro Abs 6.0 1.7 - 7.7  K/uL   Lymphocytes Relative 29 %   Lymphs Abs 2.7 0.7 - 4.0 K/uL   Monocytes Relative 5 %   Monocytes Absolute 0.5 0.1 - 1.0 K/uL   Eosinophils Relative 1 %   Eosinophils Absolute 0.1 0.0 - 0.5 K/uL   Basophils Relative 0 %   Basophils Absolute 0.0 0.0 - 0.1 K/uL   Immature Granulocytes 0 %   Abs Immature Granulocytes 0.03 0.00 - 0.07 K/uL    Comment: Performed at [facility], [address] [place], [place] [address]  Respiratory Panel by RT PCR (Flu A&B, Covid) - Nasopharyngeal Swab     Status: None   Collection Time: 03/27/19 12:20 PM   Specimen: Nasopharyngeal Swab  Result Value Ref Range   SARS Coronavirus 2  by RT PCR NEGATIVE NEGATIVE    Comment: (NOTE) SARS-CoV-2 target nucleic acids are NOT DETECTED. The SARS-CoV-2 RNA is generally detectable in upper respiratoy specimens during the acute phase of infection. The lowest concentration of SARS-CoV-2 viral copies this assay can detect is 131 copies/mL. A negative result does not preclude SARS-Cov-2 infection and should not be used as the sole basis for treatment or other patient management decisions. A negative result may occur with  improper specimen collection/handling, submission of specimen other than nasopharyngeal swab, presence of viral mutation(s) within the areas targeted by this assay, and inadequate number of viral copies (<131 copies/mL). A negative result must be combined with clinical observations, patient history, and epidemiological information. The expected result is Negative. Fact Sheet for Patients:  PinkCheek.be Fact Sheet for Healthcare Providers:  GravelBags.it This test is not yet ap proved or cleared by the [place] FDA and  has been authorized for detection and/or diagnosis of SARS-CoV-2 by FDA under an Emergency Use Authorization (EUA). This EUA will remain  in effect (meaning this test can be used) for the duration of the COVID-19 declaration under Section 564(b)(1) of the Act, 21 U.S.C. section 360bbb-3(b)(1), unless the authorization is terminated or revoked sooner.    Influenza A by PCR NEGATIVE NEGATIVE   Influenza B by PCR NEGATIVE NEGATIVE    Comment: (NOTE) The Xpert Xpress SARS-CoV-2/FLU/RSV assay is intended as an aid in  the diagnosis of influenza from Nasopharyngeal swab specimens and  should not be used as a sole basis for treatment. Nasal washings and  aspirates are unacceptable for Xpert Xpress SARS-CoV-2/FLU/RSV  testing. Fact Sheet for Patients: PinkCheek.be Fact Sheet for Healthcare  Providers: GravelBags.it This test is not yet approved or cleared by the [place] FDA and  has been authorized for detection and/or diagnosis of SARS-CoV-2 by  FDA under an Emergency Use Authorization (EUA). This EUA will remain  in effect (meaning this test can be used) for the duration of the  Covid-19 declaration under Section 564(b)(1) of the Act, 21  U.S.C. section 360bbb-3(b)(1), unless the authorization is  terminated or revoked. Performed at [facility], [place] [address], [place], [place] [address]     Chemistries  Recent Labs  Lab 03/27/19 1009  NA 143  K 4.3  CL 111  CO2 25  GLUCOSE 102*  BUN 38*  CREATININE 1.28*  CALCIUM 8.1*  AST 17  ALT 12  ALKPHOS 57  BILITOT 0.3   ------------------------------------------------------------------------------------------------------------------  ------------------------------------------------------------------------------------------------------------------ GFR: Estimated Creatinine Clearance: 42.4 mL/min (A) (by C-G formula based on SCr of 1.28 mg/dL (H)). Liver Function Tests: Recent Labs  Lab 03/27/19 1009  AST 17  ALT 12  ALKPHOS 57  BILITOT 0.3  PROT 5.2*  ALBUMIN 2.7*   No results for input(s): LIPASE, AMYLASE in the last 168 hours. No results for input(s): AMMONIA in the last 168 hours. Coagulation Profile: No results for input(s): INR, PROTIME in the last 168 hours. Cardiac Enzymes: No results for input(s): CKTOTAL, CKMB, CKMBINDEX, TROPONINI in the last 168 hours. BNP (last 3 results) No results for input(s): PROBNP in the last 8760 hours. HbA1C: No results for input(s): HGBA1C in the last 72 hours. CBG: No results for input(s): GLUCAP in the last 168 hours. Lipid Profile: No results for input(s): CHOL, HDL, LDLCALC, TRIG, CHOLHDL, LDLDIRECT in the last 72 hours. Thyroid Function Tests: No results for input(s): TSH, T4TOTAL, FREET4, T3FREE, THYROIDAB in the  last 72 hours. Anemia Panel: No results for input(s): VITAMINB12, FOLATE, FERRITIN, TIBC, IRON, RETICCTPCT in the last 72 hours.  --------------------------------------------------------------------------------------------------------------- Urine analysis:    Component Value Date/Time   COLORURINE YELLOW 07/17/2017 0741   APPEARANCEUR CLEAR 07/17/2017 0741   LABSPEC 1.018 07/17/2017 0741   PHURINE 6.0 07/17/2017 0741   GLUCOSEU NEGATIVE 07/17/2017 0741   HGBUR NEGATIVE 07/17/2017 0741   BILIRUBINUR NEGATIVE 07/17/2017 0741   BILIRUBINUR negative 08/25/2015 1515   KETONESUR NEGATIVE 07/17/2017 0741   PROTEINUR 100 (A) 07/17/2017 0741   UROBILINOGEN 1.0 08/25/2015 1515   UROBILINOGEN 0.2 10/27/2014 2321   NITRITE NEGATIVE 07/17/2017 0741   LEUKOCYTESUR SMALL (A) 07/17/2017 0741      Imaging Results:    CT ABDOMEN PELVIS W CONTRAST  Result Date: 03/27/2019 CLINICAL DATA:  Diarrhea with blood clots onset yesterday, chills, weakness. EXAM: CT ABDOMEN AND PELVIS WITH CONTRAST TECHNIQUE: Multidetector CT imaging of the abdomen and pelvis was performed using the standard protocol following bolus administration of intravenous contrast. CONTRAST:  31m OMNIPAQUE IOHEXOL 300 MG/ML  SOLN COMPARISON:  None. FINDINGS: Lower chest: No acute abnormality. Hepatobiliary: No focal liver abnormality is seen. No gallstones, gallbladder wall thickening, or biliary dilatation. Pancreas: Unremarkable. No pancreatic ductal dilatation or surrounding inflammatory changes. Spleen: Normal in size without focal abnormality. Adrenals/Urinary Tract: Adrenal glands are unremarkable. Kidneys are normal, without renal calculi, solid mass, or hydronephrosis. 2 small right renal cysts. Bladder is unremarkable. Stomach/Bowel: Stomach is within normal limits. No evidence of bowel wall thickening, distention, or inflammatory changes. Diverticulosis without evidence of diverticulitis. Vascular/Lymphatic: Normal caliber  abdominal aorta with mild atherosclerosis. No lymphadenopathy. Reproductive: Uterus and bilateral adnexa are unremarkable. Other: No abdominal wall hernia or abnormality. No abdominopelvic ascites. Musculoskeletal: No acute or significant osseous findings. Grade 1 anterolisthesis of L4 on L5 secondary to bilateral facet arthropathy. Bilateral facet arthropathy throughout the lumbar spine. IMPRESSION: 1. No acute abdominal or pelvic pathology. 2. Extensive colonic diverticulosis without evidence of diverticulitis. 3.  Aortic Atherosclerosis (ICD10-I70.0). Electronically Signed   By: HKathreen Devoid  On: 03/27/2019 12:12      Assessment & Plan:    Principal Problem:   Rectal bleeding Active Problems:   Hypertension   Diabetes mellitus type II, uncontrolled ([place]   Dyslipidemia   History of CVA (cerebrovascular accident)   Anemia   LGI bleed   GIB (gastrointestinal bleeding)  Rectal bleeding likely diverticular NPO Type and screen Check cbc now and in am Stop aspirin, plavix [facility] consulted by ED, appreciate input  Anemia Check ferritin, iron, tibc, b12, folate in am Check cbc in am  Hypertension Cont Amlodipine 14mpo qday Cont Lisinopril 1038mo qdya Cont Imdur  H/o PVD, h/o Stroke Stop, aspirin, plavix as above  Hyperlipidemia Cont Lipitor 52m po qhs  Dm2 fsbs q4h, ISS   DVT Prophylaxis-   - SCDs   AM Labs Ordered, also please review Full Orders  Family Communication: Admission, patients condition and plan of care including tests being ordered have been discussed with the patient  who indicate understanding and agree with the plan and Code Status.  Code Status:  FULL CODE per patient,   Admission status:  Inpatient: Based on patients clinical presentation and evaluation of above clinical data, I have made determination that patient meets Inpatient criteria at this time.  Pt was admitted inpatient per Dr. RTana Coastorder. Pt is anemic, w rectal bleeding and due to her  age and comorbidities has high risk of clinical deterioration.  Pt will require >2 nites stay.   Time spent in minutes : 55 minutes   JJani GravelM.D on 03/27/2019 at 8:42 PM

## 2019-03-27 NOTE — ED Triage Notes (Signed)
C/o diarrhea w blood clots onset yesterday w weakness and shivers

## 2019-03-27 NOTE — ED Notes (Signed)
Patient transported to CT 

## 2019-03-27 NOTE — ED Notes (Signed)
ED MD spoke with daughter-in-law, Jacqulyn Cane  regarding admission

## 2019-03-27 NOTE — ED Provider Notes (Signed)
[facility] EMERGENCY DEPARTMENT Provider Note   CSN: [id number] Arrival date & time: 03/27/19  4008     History Chief Complaint  Patient presents with  . Diarrhea    Meghan Ford is a 79 y.o. female.  Patient is a 79 year old female with past medical history of diabetes, hypertension, prior CVA.  She presents today for evaluation of abdominal cramping and loose/bloody stool.  This is worsened over the past 24 hours.  She feels weak and shaky.  She denies any fevers or chills.  She denies any chest pain or difficulty breathing.  The history is provided by the patient.  Diarrhea Diarrhea characteristics: Bloody. Severity:  Moderate Onset quality:  Sudden Duration:  1 day Timing:  Constant Progression:  Worsening Relieved by:  Nothing Worsened by:  Nothing Ineffective treatments:  None tried      Past Medical History:  Diagnosis Date  . Arthritis    bil knees  . Blurred vision, left eye   . Blurry vision, bilateral   . Diabetes mellitus type II, uncontrolled ([place])   . Diabetes mellitus without complication ([place])   . Dysrhythmia    patient via intepreter reports at time of last stroke , her heartbeat was very irregular ; HR WDL today, patient denies any headache, chest pain, new numbness or new worsening vision since last stroke   . Fx humeral neck   . Gait disturbance   . History of kidney stones   . Hyperlipidemia LDL goal < 100   . Hypertension   . Left arm weakness    ambulates with cane   . Leg pain    worse with prolonged standing  . Peripheral vascular disease ([place])   . Pyelonephritis 06/14/2017  . Sepsis ( [place]) 06/14/2017  . Status post placement of implantable loop recorder   . Stroke [facility]) 2018; 05/08/2017   denies residual from 2018 stroke on 05/08/2017; weakness on left; speech issues from today's stroke (05/08/2017)  . Varicose veins     Patient Active Problem List   Diagnosis Date Noted  . Hydronephrosis concurrent with and  due to calculi of kidney and ureter   . Herpes labialis 06/18/2017  . Pyelonephritis   . Renal stone   . Abdominal pain, right lateral 06/16/2017  . Anemia 06/16/2017  . Insulin dependent diabetes mellitus 06/15/2017  . History of CVA (cerebrovascular accident) 05/07/2017  . Stroke ([place]) 05/07/2017  . Intracranial vascular stenosis   . TIA (transient ischemic attack) 11/07/2016  . Insulin dependent type 2 diabetes mellitus, uncontrolled ([place]) 11/07/2016  . HLD (hyperlipidemia) 11/07/2016  . HTN (hypertension) 11/07/2016  . 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 Noncompliance with diet and medication regimen 08/26/2015  . S/p reverse total shoulder arthroplasty   . Leukocytosis   . Proximal humerus fracture 10/26/2014  . Hyperlipidemia 10/23/2014  . Hypokalemia 10/09/2014  . Essential hypertension 10/09/2014  . Protein-calorie malnutrition, severe ([place]) 09/29/2014  . Severe sepsis ([place]) 09/28/2014  . Fever 09/28/2014  . Headache 09/28/2014  . Diabetes mellitus with neuropathy ([place])   . Pyrexia   . Uncontrolled hypertension 10/15/2012  . Neuropathic pain of both legs 10/15/2012  . Palpitations 05/21/2012  . Dyslipidemia 05/15/2012  . Hypertension   . Hyperlipidemia LDL goal < 100   . Diabetes mellitus type II, uncontrolled ([address])   . Varicose veins of lower extremities with other complications 67/61/9509    Past Surgical History:  Procedure Laterality Date  .  FRACTURE SURGERY Left 2017   Shoulder  . HOLMIUM LASER APPLICATION Left 11/13/2353   Procedure: HOLMIUM LASER APPLICATION;  Surgeon: Cleon Gustin, MD;  Location: [facility];  Service: Urology;  Laterality: Left;  . LOOP RECORDER INSERTION N/A 05/09/2017   Procedure: LOOP RECORDER INSERTION;  Surgeon: Constance Haw, MD;  Location: [facility] CV LAB;  Service: Cardiovascular;  Laterality: N/A;  . NEPHROLITHOTOMY Left 09/18/2017   Procedure: CYSTOSCOPY/LEFT  URETEROSCOPIC STONE EXTRACTION/LEFT RETROGRADE/ STENT PLACEMENT;  Surgeon: Cleon Gustin, MD;  [facility];  Service: Urology;  Laterality: Left;  . REVERSE SHOULDER ARTHROPLASTY Left 10/27/2014   Procedure: REVERSE SHOULDER ARTHROPLASTY;  Surgeon: Renette Butters, MD;  Location: [facility];  Service: Orthopedics;  Laterality: Left;  . TEE WITHOUT CARDIOVERSION N/A 05/09/2017   Procedure: TRANSESOPHAGEAL ECHOCARDIOGRAM (TEE);  Surgeon: Jerline Pain, MD;  Location[facility] END[facility]COPY;  Service: Cardiovascular;  Laterality: N/A;  . TOTAL SHOULDER ARTHROPLASTY Left 10/27/2014   Procedure: TOTAL SHOULDER ARTHROPLASTY;  Surgeon: Renette Butters, MD;  Location: [facility];  Service: Orthopedics;  Laterality: Left;  Marland Kitchen VEIN SURGERY Right 1998?   right leg     OB History    Gravida  0   Para  0   Term  0   Preterm  0   AB  0   Living        SAB  0   TAB  0   Ectopic  0   Multiple      Live Births              Family History  Problem Relation Age of Onset  . Cancer Brother   . Heart disease Sister     Social History   Tobacco Use  . Smoking status: Never Smoker  . Smokeless tobacco: Never Used  Substance Use Topics  . Alcohol use: No  . Drug use: Never    Home Medications Prior to Admission medications   Medication Sig Start Date End Date Taking? Authorizing Provider  amLODipine (NORVASC) 10 MG tablet TAKE 1 TABLET (10 MG TOTAL) BY MOUTH DAILY. 02/20/19   Charlott Rakes, MD  aspirin 81 MG tablet Take 4 tablets (325 mg total) by mouth daily. 06/26/17   Colbert Ewing, MD  atorvastatin (LIPITOR) 40 MG tablet TAKE 2 TABLETS BY MOUTH DAILY 02/20/19   Charlott Rakes, MD  blood glucose meter kit and supplies KIT Dispense based on patient and insurance preference. Use up to four times daily as directed. (FOR ICD-9 250.00, 250.01). 11/09/16   Hosie Poisson, MD  Blood Glucose Monitoring Suppl (TRUE METRIX METER) DEVI 1 each by Does not apply route 3 (three) times daily before  meals. 05/14/17   Charlott Rakes, MD  clopidogrel (PLAVIX) 75 MG tablet TAKE 1 TABLET (75 MG TOTAL) BY MOUTH DAILY. 02/20/19   Charlott Rakes, MD  gabapentin (NEURONTIN) 300 MG capsule TAKE 1 CAPSULE (300 MG TOTAL) BY MOUTH AT BEDTIME. 02/21/19   Charlott Rakes, MD  glucose blood (TRUE METRIX BLOOD GLUCOSE TEST) test strip Use 3 times daily before meals 12/30/18   Ladell Pier, MD  glucose blood test strip Use as instructed 11/09/16   Hosie Poisson, MD  insulin glargine (LANTUS) 100 UNIT/ML injection Inject 0.32 mLs (32 Units total) into the skin 2 (two) times daily. 12/30/18   Ladell Pier, MD  Insulin Syringe-Needle U-100 (BD INSULIN SYRINGE ULTRAFINE) 31G X 15/64" 0.5 ML MISC 1 each by Does not apply route daily. 03/18/18  Charlott Rakes, MD  Insulin Syringes, Disposable, U-100 0.3 ML MISC USE AS DIRECTED 08/14/17   Charlott Rakes, MD  isosorbide mononitrate (IMDUR) 60 MG 24 hr tablet TAKE 1 TABLET (60 MG TOTAL) BY MOUTH DAILY. 02/20/19   Charlott Rakes, MD  lisinopril (ZESTRIL) 10 MG tablet TAKE 1 TABLET (10 MG TOTAL) BY MOUTH DAILY. 02/20/19   Charlott Rakes, MD  senna-docusate (SENOKOT-S) 8.6-50 MG tablet Take 1 tablet by mouth at bedtime as needed for mild constipation. 05/09/17   Aline August, MD  tamsulosin (FLOMAX) 0.4 MG CAPS capsule Take 1 capsule (0.4 mg total) by mouth at bedtime. Patient not taking: Reported on 12/13/2017 09/18/17   Clydene Laming, Case M, MD  TRUEPLUS LANCETS 28G MISC 1 each by Does not apply route 3 (three) times daily before meals. 05/14/17   Charlott Rakes, MD    Allergies    Patient has no known allergies.  Review of Systems   Review of Systems  Gastrointestinal: Positive for diarrhea.  All other systems reviewed and are negative.   Physical Exam Updated Vital Signs BP (!) 154/68 (BP Location: Right Arm)   Pulse 73   Temp 98.5 F (36.9 C) (Oral)   Resp 20   Ht '5\' 6"'$  (1.676 m)   Wt 90.7 kg   SpO2 100%   BMI 32.28 kg/m   Physical Exam Vitals  and nursing note reviewed.  Constitutional:      General: She is not in acute distress.    Appearance: She is well-developed. She is not diaphoretic.  HENT:     Head: Normocephalic and atraumatic.  Cardiovascular:     Rate and Rhythm: Normal rate and regular rhythm.     Heart sounds: No murmur. No friction rub. No gallop.   Pulmonary:     Effort: Pulmonary effort is normal. No respiratory distress.     Breath sounds: Normal breath sounds. No wheezing.  Abdominal:     General: Bowel sounds are normal. There is no distension.     Palpations: Abdomen is soft. There is no mass.     Tenderness: There is abdominal tenderness. There is no right CVA tenderness, left CVA tenderness, guarding or rebound.     Hernia: No hernia is present.     Comments: There is mild generalized tenderness to palpation.  Musculoskeletal:        General: Normal range of motion.     Cervical back: Normal range of motion and neck supple.  Skin:    General: Skin is warm and dry.  Neurological:     Mental Status: She is alert and oriented to person, place, and time.     ED Results / Procedures / Treatments   Labs (all labs ordered are listed, but only abnormal results are displayed) Labs Reviewed  COMPREHENSIVE METABOLIC PANEL  CBC WITH DIFFERENTIAL/PLATELET    EKG None  Radiology No results found.  Procedures Procedures (including critical care time)  Medications Ordered in ED Medications  sodium chloride 0.9 % bolus 1,000 mL (has no administration in time range)    ED Course  I have reviewed the triage vital signs and the nursing notes.  Pertinent labs & imaging results that were available during my care of the patient were reviewed by me and considered in my medical decision making (see chart for details).    MDM Rules/Calculators/A&P  Patient presenting here with a 2-day history of abdominal cramping and bloody stools.  Patient's hemoglobin today is 8.8, down from 13.7 from October.  CT  scan shows extensive diverticulosis, but no evidence for diverticulitis.  Patient appears hemodynamically stable with adequate blood pressure and no tachycardia.  Patient's care was discussed with Dr. Paulita Fujita from gastroenterology.  He is recommending admission.  I have spoken with Dr. Tana Coast who agrees to admit.  Final Clinical Impression(s) / ED Diagnoses Final diagnoses:  None    Rx / DC Orders ED Discharge Orders    None       Veryl Speak, MD 03/27/19 1315

## 2019-03-28 ENCOUNTER — Encounter (HOSPITAL_COMMUNITY): Payer: Self-pay | Admitting: Internal Medicine

## 2019-03-28 DIAGNOSIS — K625 Hemorrhage of anus and rectum: Secondary | ICD-10-CM

## 2019-03-28 LAB — COMPREHENSIVE METABOLIC PANEL
ALT: 13 U/L (ref 0–44)
AST: 18 U/L (ref 15–41)
Albumin: 2.6 g/dL — ABNORMAL LOW (ref 3.5–5.0)
Alkaline Phosphatase: 48 U/L (ref 38–126)
Anion gap: 7 (ref 5–15)
BUN: 32 mg/dL — ABNORMAL HIGH (ref 8–23)
CO2: 24 mmol/L (ref 22–32)
Calcium: 8.1 mg/dL — ABNORMAL LOW (ref 8.9–10.3)
Chloride: 113 mmol/L — ABNORMAL HIGH (ref 98–111)
Creatinine, Ser: 1.17 mg/dL — ABNORMAL HIGH (ref 0.44–1.00)
GFR calc Af Amer: 52 mL/min — ABNORMAL LOW (ref >60)
GFR calc non Af Amer: 45 mL/min — ABNORMAL LOW (ref >60)
Glucose, Bld: 78 mg/dL (ref 70–99)
Potassium: 4 mmol/L (ref 3.5–5.1)
Sodium: 144 mmol/L (ref 135–145)
Total Bilirubin: 0.5 mg/dL (ref 0.3–1.2)
Total Protein: 4.8 g/dL — ABNORMAL LOW (ref 6.5–8.1)

## 2019-03-28 LAB — GLUCOSE, CAPILLARY
Glucose-Capillary: 145 mg/dL — ABNORMAL HIGH (ref 70–99)
Glucose-Capillary: 83 mg/dL (ref 70–99)

## 2019-03-28 LAB — VITAMIN B12: Vitamin B-12: 246 pg/mL (ref 180–914)

## 2019-03-28 LAB — CBC
HCT: 24.1 % — ABNORMAL LOW (ref 36.0–46.0)
Hemoglobin: 7.2 g/dL — ABNORMAL LOW (ref 12.0–15.0)
MCH: 25.2 pg — ABNORMAL LOW (ref 26.0–34.0)
MCHC: 29.9 g/dL — ABNORMAL LOW (ref 30.0–36.0)
MCV: 84.3 fL (ref 80.0–100.0)
Platelets: 233 10*3/uL (ref 150–400)
RBC: 2.86 MIL/uL — ABNORMAL LOW (ref 3.87–5.11)
RDW: 15.7 % — ABNORMAL HIGH (ref 11.5–15.5)
WBC: 7.5 10*3/uL (ref 4.0–10.5)
nRBC: 0 % (ref 0.0–0.2)

## 2019-03-28 LAB — IRON AND TIBC
Iron: 42 ug/dL (ref 28–170)
Saturation Ratios: 16 % (ref 10.4–31.8)
TIBC: 257 ug/dL (ref 250–450)
UIBC: 215 ug/dL

## 2019-03-28 LAB — PREPARE RBC (CROSSMATCH)

## 2019-03-28 LAB — FERRITIN: Ferritin: 22 ng/mL (ref 11–307)

## 2019-03-28 MED ORDER — PEG 3350-KCL-NA BICARB-NACL 420 G PO SOLR
4000.0000 mL | Freq: Once | ORAL | Status: AC
  Administered 2019-03-28: 4000 mL via ORAL

## 2019-03-28 MED ORDER — SODIUM CHLORIDE 0.9 % IV SOLN: INTRAVENOUS | Status: DC

## 2019-03-28 MED ORDER — INSULIN ASPART 100 UNIT/ML ~~LOC~~ SOLN: 0.0000 [IU] | Freq: Three times a day (TID) | SUBCUTANEOUS | Status: DC

## 2019-03-28 MED ORDER — BISACODYL 10 MG RE SUPP: 10.0000 mg | Freq: Once | RECTAL | Status: DC

## 2019-03-28 MED ORDER — ADULT MULTIVITAMIN W/MINERALS CH
1.0000 | ORAL_TABLET | Freq: Every day | ORAL | Status: DC
  Administered 2019-03-28: 1 via ORAL
  Filled 2019-03-28 (×2): qty 1

## 2019-03-28 MED ORDER — SODIUM CHLORIDE 0.9% IV SOLUTION: Freq: Once | INTRAVENOUS | Status: AC

## 2019-03-28 MED ORDER — PRO-STAT SUGAR FREE PO LIQD
30.0000 mL | Freq: Two times a day (BID) | ORAL | Status: DC
  Administered 2019-03-28 – 2019-03-29 (×3): 30 mL via ORAL
  Filled 2019-03-28 (×3): qty 30

## 2019-03-28 MED ORDER — BOOST / RESOURCE BREEZE PO LIQD CUSTOM
1.0000 | Freq: Three times a day (TID) | ORAL | Status: DC
  Administered 2019-03-28 (×3): 1 via ORAL

## 2019-03-28 NOTE — Consult Note (Signed)
Meghan Ford  Referring Provider: [facility] Primary Care Physician:  Charlott Rakes, MD  Reason for Consultation:  hematochezia  HPI: Meghan Ford is a 79 y.o. female admitted for hematochezia.  History obtained via phone line interpreter.  Started couple days ago.  History stroke on clopidigrel, last dose 2 days ago.  No prior colonoscopy.  No nausea, vomiting, abdominal pain, change in bowel habits, unintentional weight loss.  No known family history of colon cancer or polyps.   Past Medical History:  Diagnosis Date  . Arthritis    bil knees  . Blurred vision, left eye   . Blurry vision, bilateral   . Diabetes mellitus type II, uncontrolled ([place])   . Diabetes mellitus without complication ([place])   . Dysrhythmia    patient via intepreter reports at time of last stroke , her heartbeat was very irregular ; HR WDL today, patient denies any headache, chest pain, new numbness or new worsening vision since last stroke   . Fx humeral neck   . Gait disturbance   . History of kidney stones   . Hyperlipidemia LDL goal < 100   . Hypertension   . Left arm weakness    ambulates with cane   . Leg pain    worse with prolonged standing  . Peripheral vascular disease ([place])   . Pyelonephritis 06/14/2017  . Sepsis ([place]) 06/14/2017  . Status post placement of implantable loop recorder   . Stroke [facility]) 2018; 05/08/2017   denies residual from 2018 stroke on 05/08/2017; weakness on left; speech issues from today's stroke (05/08/2017)  . Varicose veins     Past Surgical History:  Procedure Laterality Date  . FRACTURE SURGERY Left 2017   Shoulder  . HOLMIUM LASER APPLICATION Left 05/12/5174   Procedure: HOLMIUM LASER APPLICATION;  Surgeon: Cleon Gustin, MD;  [facility];  Service: Urology;  Laterality: Left;  . LOOP RECORDER INSERTION N/A 05/09/2017   Procedure: LOOP RECORDER INSERTION;  Surgeon: Constance Haw, MD;  Location: [facility] CV LAB;  Service: Cardiovascular;  Laterality: N/A;  . NEPHROLITHOTOMY Left 09/18/2017   Procedure: CYSTOSCOPY/LEFT URETEROSCOPIC STONE EXTRACTION/LEFT RETROGRADE/ STENT PLACEMENT;  Surgeon: Cleon Gustin, MD;  [facility];  Service: Urology;  Laterality: Left;  . REVERSE SHOULDER ARTHROPLASTY Left 10/27/2014   Procedure: REVERSE SHOULDER ARTHROPLASTY;  Surgeon: Renette Butters, MD;  Location: [facility];  Service: Orthopedics;  Laterality: Left;  . TEE WITHOUT CARDIOVERSION N/A 05/09/2017   Procedure: TRANSESOPHAGEAL ECHOCARDIOGRAM (TEE);  Surgeon: Jerline Pain, MD;  Location: [facility] ENDOSCOPY;  Service: Cardiovascular;  Laterality: N/A;  . TOTAL SHOULDER ARTHROPLASTY Left 10/27/2014   Procedure: TOTAL SHOULDER ARTHROPLASTY;  Surgeon: Renette Butters, MD;  Location: [facility];  Service: Orthopedics;  Laterality: Left;  Marland Kitchen VEIN SURGERY Right 1998?   right leg    Prior to Admission medications   Medication Sig Start Date End Date Taking? Authorizing Provider  amLODipine (NORVASC) 10 MG tablet TAKE 1 TABLET (10 MG TOTAL) BY MOUTH DAILY. 02/20/19  Yes Charlott Rakes, MD  aspirin 81 MG tablet Take 4 tablets (325 mg total) by mouth daily. 06/26/17  Yes Colbert Ewing, MD  atorvastatin (LIPITOR) 40 MG tablet TAKE 2 TABLETS BY MOUTH DAILY Patient taking differently: Take 80 mg by mouth daily.  02/20/19  Yes Charlott Rakes, MD  clopidogrel (PLAVIX) 75 MG tablet TAKE 1 TABLET (75 MG TOTAL) BY MOUTH DAILY. 02/20/19  Yes Charlott Rakes, MD  gabapentin (NEURONTIN) 300 MG capsule  TAKE 1 CAPSULE (300 MG TOTAL) BY MOUTH AT BEDTIME. 02/21/19  Yes Newlin, Enobong, MD  insulin glargine (LANTUS) 100 UNIT/ML injection Inject 0.32 mLs (32 Units total) into the skin 2 (two) times daily. 12/30/18  Yes Ladell Pier, MD  lisinopril (ZESTRIL) 10 MG tablet TAKE 1 TABLET (10 MG TOTAL) BY MOUTH DAILY. 02/20/19  Yes Newlin, Charlane Ferretti, MD  senna-docusate (SENOKOT-S) 8.6-50 MG tablet Take 1 tablet by mouth at  bedtime as needed for mild constipation. 05/09/17  Yes Aline August, MD  blood glucose meter kit and supplies KIT Dispense based on patient and insurance preference. Use up to four times daily as directed. (FOR ICD-9 250.00, 250.01). 11/09/16   Hosie Poisson, MD  Blood Glucose Monitoring Suppl (TRUE METRIX METER) DEVI 1 each by Does not apply route 3 (three) times daily before meals. 05/14/17   Charlott Rakes, MD  glucose blood (TRUE METRIX BLOOD GLUCOSE TEST) test strip Use 3 times daily before meals 12/30/18   Karle Plumber B, MD  glucose blood test strip Use as instructed 11/09/16   Hosie Poisson, MD  Insulin Syringe-Needle U-100 (BD INSULIN SYRINGE ULTRAFINE) 31G X 15/64" 0.5 ML MISC 1 each by Does not apply route daily. 03/18/18   Charlott Rakes, MD  Insulin Syringes, Disposable, U-100 0.3 ML MISC USE AS DIRECTED 08/14/17   Charlott Rakes, MD  isosorbide mononitrate (IMDUR) 60 MG 24 hr tablet TAKE 1 TABLET (60 MG TOTAL) BY MOUTH DAILY. 02/20/19   Charlott Rakes, MD  tamsulosin (FLOMAX) 0.4 MG CAPS capsule Take 1 capsule (0.4 mg total) by mouth at bedtime. Patient not taking: Reported on 12/13/2017 09/18/17   Clydene Laming, Case M, MD  TRUEPLUS LANCETS 28G MISC 1 each by Does not apply route 3 (three) times daily before meals. 05/14/17   Charlott Rakes, MD    Current Facility-Administered Medications  Medication Dose Route Frequency Provider Last Rate Last Admin  . acetaminophen (TYLENOL) tablet 650 mg  650 mg Oral Q6H PRN Jani Gravel, MD       Or  . acetaminophen (TYLENOL) suppository 650 mg  650 mg Rectal Q6H PRN Jani Gravel, MD        Allergies as of 03/27/2019  . (No Known Allergies)    Family History  Problem Relation Age of Onset  . Cancer Brother   . Heart disease Sister     Social History   Socioeconomic History  . Marital status: Single    Spouse name: Not on file  . Number of children: Not on file  . Years of education: Not on file  . Highest education level: Not on file   Occupational History  . Not on file  Tobacco Use  . Smoking status: Never Smoker  . Smokeless tobacco: Never Used  Substance and Sexual Activity  . Alcohol use: No  . Drug use: Never  . Sexual activity: Not on file  Other Topics Concern  . Not on file  Social History Narrative   ** Merged History Encounter **       Lives with son [name] who comes with her to majority of visits and helps coordinate care for her. He translates for her and has signed and patient signed a release for this on 02/02/12.          Social Determinants of Health   Financial Resource Strain:   . Difficulty of Paying Living Expenses: Not on file  Food Insecurity:   . Worried About Charity fundraiser in the Last  Year: Not on file  . Ran Out of Food in the Last Year: Not on file  Transportation Needs:   . Lack of Transportation (Medical): Not on file  . Lack of Transportation (Non-Medical): Not on file  Physical Activity:   . Days of Exercise per Week: Not on file  . Minutes of Exercise per Session: Not on file  Stress:   . Feeling of Stress : Not on file  Social Connections:   . Frequency of Communication with Friends and Family: Not on file  . Frequency of Social Gatherings with Friends and Family: Not on file  . Attends Religious Services: Not on file  . Active Member of Clubs or Organizations: Not on file  . Attends Archivist Meetings: Not on file  . Marital Status: Not on file  Intimate Partner Violence:   . Fear of Current or Ex-Partner: Not on file  . Emotionally Abused: Not on file  . Physically Abused: Not on file  . Sexually Abused: Not on file    Review of Systems: As per HPI, all others negative  Physical Exam: Vital signs in last 24 hours: Temp:  [98.8 F (37.1 C)] 98.8 F (37.1 C) (01/15 0434) Pulse Rate:  [69-92] 88 (01/15 0434) Resp:  [16-20] 16 (01/15 0434) BP: (144-160)/(54-80) 155/67 (01/15 0434) SpO2:  [96 %-100 %] 96 % (01/15 0434) Weight:  [99.6  kg] 99.6 kg (01/15 0432) Last BM Date: 03/27/19(pta) General:   Alert,  Well-developed, well-nourished, pleasant and cooperative in NAD Head:  Normocephalic and atraumatic. Eyes:  Sclera clear, no icterus.   Conjunctiva pink. Ears:  Normal auditory acuity. Nose:  No deformity, discharge,  or lesions. Mouth:  No deformity or lesions.  Oropharynx pink & moist. Neck:  Supple; no masses or thyromegaly. Lungs:  Clear throughout to auscultation.   No wheezes, crackles, or rhonchi. No acute distress. Heart:  Regular rate and rhythm; no murmurs, clicks, rubs,  or gallops. Abdomen:  Soft, obese, protuberant, nontender and nondistended. No masses, hepatosplenomegaly or hernias noted. Normal bowel sounds, without guarding, and without rebound.     Msk:  Symmetrical without gross deformities. Normal posture. Pulses:  Normal pulses noted. Extremities:  Without clubbing or edema. Neurologic:  Alert and  oriented x4;  grossly normal neurologically. Skin:  Intact without significant lesions or rashes. Psych:  Alert and cooperative. Normal mood and affect.   Lab Results: Recent Labs    03/27/19 1009 03/27/19 2107 03/28/19 0539  WBC 9.3 9.1 7.5  HGB 8.8* 8.0* 7.2*  HCT 29.3* 26.5* 24.1*  PLT 264 247 233   BMET Recent Labs    03/27/19 1009 03/28/19 0539  NA 143 144  K 4.3 4.0  CL 111 113*  CO2 25 24  GLUCOSE 102* 78  BUN 38* 32*  CREATININE 1.28* 1.17*  CALCIUM 8.1* 8.1*   LFT Recent Labs    03/28/19 0539  PROT 4.8*  ALBUMIN 2.6*  AST 18  ALT 13  ALKPHOS 48  BILITOT 0.5   PT/INR Recent Labs    03/27/19 2107  LABPROT 13.2  INR 1.0    Studies/Results: CT ABDOMEN PELVIS W CONTRAST  Result Date: 03/27/2019 CLINICAL DATA:  Diarrhea with blood clots onset yesterday, chills, weakness. EXAM: CT ABDOMEN AND PELVIS WITH CONTRAST TECHNIQUE: Multidetector CT imaging of the abdomen and pelvis was performed using the standard protocol following bolus administration of intravenous  contrast. CONTRAST:  65m OMNIPAQUE IOHEXOL 300 MG/ML  SOLN COMPARISON:  None. FINDINGS: Lower chest:  No acute abnormality. Hepatobiliary: No focal liver abnormality is seen. No gallstones, gallbladder wall thickening, or biliary dilatation. Pancreas: Unremarkable. No pancreatic ductal dilatation or surrounding inflammatory changes. Spleen: Normal in size without focal abnormality. Adrenals/Urinary Tract: Adrenal glands are unremarkable. Kidneys are normal, without renal calculi, solid mass, or hydronephrosis. 2 small right renal cysts. Bladder is unremarkable. Stomach/Bowel: Stomach is within normal limits. No evidence of bowel wall thickening, distention, or inflammatory changes. Diverticulosis without evidence of diverticulitis. Vascular/Lymphatic: Normal caliber abdominal aorta with mild atherosclerosis. No lymphadenopathy. Reproductive: Uterus and bilateral adnexa are unremarkable. Other: No abdominal wall hernia or abnormality. No abdominopelvic ascites. Musculoskeletal: No acute or significant osseous findings. Grade 1 anterolisthesis of L4 on L5 secondary to bilateral facet arthropathy. Bilateral facet arthropathy throughout the lumbar spine. IMPRESSION: 1. No acute abdominal or pelvic pathology. 2. Extensive colonic diverticulosis without evidence of diverticulitis. 3.  Aortic Atherosclerosis (ICD10-I70.0). Electronically Signed   By: Kathreen Devoid   On: 03/27/2019 12:12    Impression:  1.  Painless hematochezia. Suspect colonic diverticulosis. 2.   Acute blood loss diarrhea. 3.  CT scan abdomen with diverticulosis in colon. 4.  History TIA; on clopidigrel (now on hold).  Plan:  1.  Medical management; serial CBCs, volume repletion, transfusion PRBCs. 2.  Hold clopidigrel. 3.  Clear liquids today, NPO after midnight. 4.  Colonoscopy planned for tomorrow. 5.  Risks (bleeding, infection, bowel perforation that could require surgery, sedation-related changes in cardiopulmonary systems), benefits  (identification and possible treatment of source of symptoms, exclusion of certain causes of symptoms), and alternatives (watchful waiting, radiographic imaging studies, empiric medical treatment) of colonoscopy were explained to patient/family in detail and patient wishes to proceed. 6.  Next step in management pending colonoscopy findings.   LOS: 1 day   , M  03/28/2019, 10:37 AM  Cell [phone] If no answer or after 5 PM call [phone]

## 2019-03-28 NOTE — Progress Notes (Signed)
PROGRESS NOTE    Meghan Ford  KZS:[id number] DOB: 1940-06-28 DOA: 03/27/2019 PCP: Hoy Register, MD   Brief Narrative: 79 y.o. female,  Whypertension, hyperlipidemia, Dm2, PVD, h/o stroke, presents with c/o rectal bleeding since yesterday.  Pt notes slight diarrhea, loose stool as well as lower abdominal discomfort, bilaterally, mild.  No recent colonoscopy .   Pt denies fever, chills, cough, cp, palp, sob, n/v, epigastric, pain, constipation, black stool.  Pt presented to ED due to rectal bleeding.    In ED,  T 98.5 P 73  R 20, Bp; 154/68  Pox 10% on RA wt 90.7kg   CT abd/ pelvis IMPRESSION: 1. No acute abdominal or pelvic pathology. 2. Extensive colonic diverticulosis without evidence of diverticulitis. 3. Aortic Atherosclerosis (ICD10-I70.0).  Na 143, K 4.3, Bun 38, Creatinine 1.28 Ast 17, Alt 12, Alb 2.7 Wbc 9.3, Hgb 8.8, Plt 264  Covid negative   Assessment & Plan:   Principal Problem:   Rectal bleeding Active Problems:   Hypertension   Diabetes mellitus type II, uncontrolled (HCC)   Dyslipidemia   History of CVA (cerebrovascular accident)   Anemia   LGI bleed   GIB (gastrointestinal bleeding)    #1 lower GI bleed likely secondary to diverticulosis-patient has not had any GI work-up in the past.  Appreciate GI input.  Plan for colonoscopy tomorrow morning.  We will keep her n.p.o. after midnight and on clear liquids today.  Continue to hold Plavix and aspirin. CT of the abdomen shows diverticulosis without evidence of diverticulitis.  #2 history of TIA on Plavix on hold.  #3 hypertension on amlodipine 10 mg daily, lisinopril 10 mg daily and Imdur.  Her blood pressure today is 160/63.  #4 acute blood loss anemia hemoglobin on admission is 8.8 which is down from 13.9 on October 2020.  Serial CBCs shows the latest hemoglobin to be 7.2.  I will transfuse her 1 unit of packed RBC.  #5 history of PVD aspirin and Plavix on hold  #6  hyperlipidemia on Lipitor  #7 type 2 diabetes-no CBGs done will start SSI check CBG  Nutrition Problem: Inadequate protein intake Etiology: other (see comment)(current diet order)     Signs/Symptoms: other (comment)(CLD does not meet estimated protein need.)    Interventions: Prostat, Boost Breeze, MVI  Estimated body mass index is 36.25 kg/m as calculated from the following:   Height as of this encounter: 5\' 5"  (1.651 m).   Weight as of this encounter: 98.8 kg.  DVT prophylaxis: SCD Code Status: Full code Family Communication: None Disposition Plan: She is pending GI work-up colonoscopy tomorrow with ongoing bleeding getting blood transfusion not ready for discharge yet   Consultants:   GI  Procedures: None Antimicrobials none  Subjective: She is sitting up by the side of the bed talk to her with interpreters Staff reports no further bloody bowel movements today  Objective: Vitals:   03/28/19 0432 03/28/19 0434 03/28/19 1100 03/28/19 1345  BP:  (!) 155/67  (!) 160/63  Pulse:  88  78  Resp:  16  20  Temp:  98.8 F (37.1 C)  98.7 F (37.1 C)  TempSrc:      SpO2:  96%  100%  Weight: 99.6 kg  98.8 kg   Height:   5\' 5"  (1.651 m)     Intake/Output Summary (Last 24 hours) at 03/28/2019 1423 Last data filed at 03/28/2019 1246 Gross per 24 hour  Intake 0 ml  Output --  Net 0 ml  Filed Weights   03/27/19 0934 03/28/19 0432 03/28/19 1100  Weight: 90.7 kg 99.6 kg 98.8 kg    Examination:  General exam: Appears calm and comfortable  Respiratory system: Clear to auscultation. Respiratory effort normal. Cardiovascular system: S1 & S2 heard, RRR. No JVD, murmurs, rubs, gallops or clicks. No pedal edema. Gastrointestinal system: Abdomen is nondistended, soft and nontender. No organomegaly or masses felt. Normal bowel sounds heard. Central nervous system: Alert and oriented. No focal neurological deficits. Extremities: Symmetric 5 x 5 power. Skin: No rashes,  lesions or ulcers Psychiatry: Judgement and insight appear normal. Mood & affect appropriate.     Data Reviewed: I have personally reviewed following labs and imaging studies  CBC: Recent Labs  Lab 03/27/19 1009 03/27/19 2107 03/28/19 0539  WBC 9.3 9.1 7.5  NEUTROABS 6.0  --   --   HGB 8.8* 8.0* 7.2*  HCT 29.3* 26.5* 24.1*  MCV 83.2 84.1 84.3  PLT 264 247 938   Basic Metabolic Panel: Recent Labs  Lab 03/27/19 1009 03/28/19 0539  NA 143 144  K 4.3 4.0  CL 111 113*  CO2 25 24  GLUCOSE 102* 78  BUN 38* 32*  CREATININE 1.28* 1.17*  CALCIUM 8.1* 8.1*   GFR: Estimated Creatinine Clearance: 47.6 mL/min (A) (by C-G formula based on SCr of 1.17 mg/dL (H)). Liver Function Tests: Recent Labs  Lab 03/27/19 1009 03/28/19 0539  AST 17 18  ALT 12 13  ALKPHOS 57 48  BILITOT 0.3 0.5  PROT 5.2* 4.8*  ALBUMIN 2.7* 2.6*   No results for input(s): LIPASE, AMYLASE in the last 168 hours. No results for input(s): AMMONIA in the last 168 hours. Coagulation Profile: Recent Labs  Lab 03/27/19 2107  INR 1.0   Cardiac Enzymes: No results for input(s): CKTOTAL, CKMB, CKMBINDEX, TROPONINI in the last 168 hours. BNP (last 3 results) No results for input(s): PROBNP in the last 8760 hours. HbA1C: No results for input(s): HGBA1C in the last 72 hours. CBG: No results for input(s): GLUCAP in the last 168 hours. Lipid Profile: No results for input(s): CHOL, HDL, LDLCALC, TRIG, CHOLHDL, LDLDIRECT in the last 72 hours. Thyroid Function Tests: No results for input(s): TSH, T4TOTAL, FREET4, T3FREE, THYROIDAB in the last 72 hours. Anemia Panel: Recent Labs    03/28/19 0539  VITAMINB12 246  FERRITIN 22  TIBC 257  IRON 42   Sepsis Labs: No results for input(s): PROCALCITON, LATICACIDVEN in the last 168 hours.  Recent Results (from the past 240 hour(s))  Respiratory Panel by RT PCR (Flu A&B, Covid) - Nasopharyngeal Swab     Status: None   Collection Time: 03/27/19 12:20 PM    Specimen: Nasopharyngeal Swab  Result Value Ref Range Status   SARS Coronavirus 2 by RT PCR NEGATIVE NEGATIVE Final    Comment: (NOTE) SARS-CoV-2 target nucleic acids are NOT DETECTED. The SARS-CoV-2 RNA is generally detectable in upper respiratoy specimens during the acute phase of infection. The lowest concentration of SARS-CoV-2 viral copies this assay can detect is 131 copies/mL. A negative result does not preclude SARS-Cov-2 infection and should not be used as the sole basis for treatment or other patient management decisions. A negative result may occur with  improper specimen collection/handling, submission of specimen other than nasopharyngeal swab, presence of viral mutation(s) within the areas targeted by this assay, and inadequate number of viral copies (<131 copies/mL). A negative result must be combined with clinical observations, patient history, and epidemiological information. The expected result is Negative. Fact  Sheet for Patients:  https://www.moore.com/ Fact Sheet for Healthcare Providers:  https://www.young.biz/ This test is not yet ap proved or cleared by the [place] FDA and  has been authorized for detection and/or diagnosis of SARS-CoV-2 by FDA under an Emergency Use Authorization (EUA). This EUA will remain  in effect (meaning this test can be used) for the duration of the COVID-19 declaration under Section 564(b)(1) of the Act, 21 U.S.C. section 360bbb-3(b)(1), unless the authorization is terminated or revoked sooner.    Influenza A by PCR NEGATIVE NEGATIVE Final   Influenza B by PCR NEGATIVE NEGATIVE Final    Comment: (NOTE) The Xpert Xpress SARS-CoV-2/FLU/RSV assay is intended as an aid in  the diagnosis of influenza from Nasopharyngeal swab specimens and  should not be used as a sole basis for treatment. Nasal washings and  aspirates are unacceptable for Xpert Xpress SARS-CoV-2/FLU/RSV  testing. Fact Sheet  for Patients: https://www.moore.com/ Fact Sheet for Healthcare Providers: https://www.young.biz/ This test is not yet approved or cleared by the [place] FDA and  has been authorized for detection and/or diagnosis of SARS-CoV-2 by  FDA under an Emergency Use Authorization (EUA). This EUA will remain  in effect (meaning this test can be used) for the duration of the  Covid-19 declaration under Section 564(b)(1) of the Act, 21  U.S.C. section 360bbb-3(b)(1), unless the authorization is  terminated or revoked. Performed at [facility], [address] [place], [place] [address]          Radiology Studies: CT ABDOMEN PELVIS W CONTRAST  Result Date: 03/27/2019 CLINICAL DATA:  Diarrhea with blood clots onset yesterday, chills, weakness. EXAM: CT ABDOMEN AND PELVIS WITH CONTRAST TECHNIQUE: Multidetector CT imaging of the abdomen and pelvis was performed using the standard protocol following bolus administration of intravenous contrast. CONTRAST:  40mL OMNIPAQUE IOHEXOL 300 MG/ML  SOLN COMPARISON:  None. FINDINGS: Lower chest: No acute abnormality. Hepatobiliary: No focal liver abnormality is seen. No gallstones, gallbladder wall thickening, or biliary dilatation. Pancreas: Unremarkable. No pancreatic ductal dilatation or surrounding inflammatory changes. Spleen: Normal in size without focal abnormality. Adrenals/Urinary Tract: Adrenal glands are unremarkable. Kidneys are normal, without renal calculi, solid mass, or hydronephrosis. 2 small right renal cysts. Bladder is unremarkable. Stomach/Bowel: Stomach is within normal limits. No evidence of bowel wall thickening, distention, or inflammatory changes. Diverticulosis without evidence of diverticulitis. Vascular/Lymphatic: Normal caliber abdominal aorta with mild atherosclerosis. No lymphadenopathy. Reproductive: Uterus and bilateral adnexa are unremarkable. Other: No abdominal wall hernia or  abnormality. No abdominopelvic ascites. Musculoskeletal: No acute or significant osseous findings. Grade 1 anterolisthesis of L4 on L5 secondary to bilateral facet arthropathy. Bilateral facet arthropathy throughout the lumbar spine. IMPRESSION: 1. No acute abdominal or pelvic pathology. 2. Extensive colonic diverticulosis without evidence of diverticulitis. 3.  Aortic Atherosclerosis (ICD10-I70.0). Electronically Signed   By: Elige Ko   On: 03/27/2019 12:12        Scheduled Meds: . bisacodyl  10 mg Rectal Once  . feeding supplement  1 Container Oral TID BM  . feeding supplement (PRO-STAT SUGAR FREE 64)  30 mL Oral BID  . multivitamin with minerals  1 tablet Oral Daily  . polyethylene glycol-electrolytes  4,000 mL Oral Once   Continuous Infusions:   LOS: 1 day     Alwyn Ren, MD [facility]  If 7PM-7AM, please contact night-coverage [email or web address] Password [facility] 03/28/2019, 2:23 PM

## 2019-03-28 NOTE — H&P (View-Only) (Signed)
[facility] Consultation Note  Referring Provider: [facility] Primary Care Physician:  Newlin, Enobong, MD  Reason for Consultation:  hematochezia  HPI: Meghan Ford is a 78 y.o. female admitted for hematochezia.  History obtained via phone line interpreter.  Started couple days ago.  History stroke on clopidigrel, last dose 2 days ago.  No prior colonoscopy.  No nausea, vomiting, abdominal pain, change in bowel habits, unintentional weight loss.  No known family history of colon cancer or polyps.   Past Medical History:  Diagnosis Date  . Arthritis    bil knees  . Blurred vision, left eye   . Blurry vision, bilateral   . Diabetes mellitus type II, uncontrolled (HCC)   . Diabetes mellitus without complication (HCC)   . Dysrhythmia    patient via intepreter reports at time of last stroke , her heartbeat was very irregular ; HR WDL today, patient denies any headache, chest pain, new numbness or new worsening vision since last stroke   . Fx humeral neck   . Gait disturbance   . History of kidney stones   . Hyperlipidemia LDL goal < 100   . Hypertension   . Left arm weakness    ambulates with cane   . Leg pain    worse with prolonged standing  . Peripheral vascular disease (HCC)   . Pyelonephritis 06/14/2017  . Sepsis (HCC) 06/14/2017  . Status post placement of implantable loop recorder   . Stroke (HCC) 2018; 05/08/2017   denies residual from 2018 stroke on 05/08/2017; weakness on left; speech issues from today's stroke (05/08/2017)  . Varicose veins     Past Surgical History:  Procedure Laterality Date  . FRACTURE SURGERY Left 2017   Shoulder  . HOLMIUM LASER APPLICATION Left 09/18/2017   Procedure: HOLMIUM LASER APPLICATION;  Surgeon: McKenzie, Patrick L, MD;  Location: [facility] ORS;  Service: Urology;  Laterality: Left;  . LOOP RECORDER INSERTION N/A 05/09/2017   Procedure: LOOP RECORDER INSERTION;  Surgeon: Camnitz, Will Martin, MD;  Location: [facility]  INVASIVE CV LAB;  Service: Cardiovascular;  Laterality: N/A;  . NEPHROLITHOTOMY Left 09/18/2017   Procedure: CYSTOSCOPY/LEFT URETEROSCOPIC STONE EXTRACTION/LEFT RETROGRADE/ STENT PLACEMENT;  Surgeon: McKenzie, Patrick L, MD;  Location: [facility];  Service: Urology;  Laterality: Left;  . REVERSE SHOULDER ARTHROPLASTY Left 10/27/2014   Procedure: REVERSE SHOULDER ARTHROPLASTY;  Surgeon: Timothy D Murphy, MD;  Location: [facility] OR;  Service: Orthopedics;  Laterality: Left;  . TEE WITHOUT CARDIOVERSION N/A 05/09/2017   Procedure: TRANSESOPHAGEAL ECHOCARDIOGRAM (TEE);  Surgeon: Skains, Mark C, MD;  Location: [facility] ENDOSCOPY;  Service: Cardiovascular;  Laterality: N/A;  . TOTAL SHOULDER ARTHROPLASTY Left 10/27/2014   Procedure: TOTAL SHOULDER ARTHROPLASTY;  Surgeon: Timothy D Murphy, MD;  Location: [facility] OR;  Service: Orthopedics;  Laterality: Left;  . VEIN SURGERY Right 1998?   right leg    Prior to Admission medications   Medication Sig Start Date End Date Taking? Authorizing Provider  amLODipine (NORVASC) 10 MG tablet TAKE 1 TABLET (10 MG TOTAL) BY MOUTH DAILY. 02/20/19  Yes Newlin, Enobong, MD  aspirin 81 MG tablet Take 4 tablets (325 mg total) by mouth daily. 06/26/17  Yes Huang, Jennifer, MD  atorvastatin (LIPITOR) 40 MG tablet TAKE 2 TABLETS BY MOUTH DAILY Patient taking differently: Take 80 mg by mouth daily.  02/20/19  Yes Newlin, Enobong, MD  clopidogrel (PLAVIX) 75 MG tablet TAKE 1 TABLET (75 MG TOTAL) BY MOUTH DAILY. 02/20/19  Yes Newlin, Enobong, MD  gabapentin (NEURONTIN) 300 MG capsule   TAKE 1 CAPSULE (300 MG TOTAL) BY MOUTH AT BEDTIME. 02/21/19  Yes Newlin, Enobong, MD  insulin glargine (LANTUS) 100 UNIT/ML injection Inject 0.32 mLs (32 Units total) into the skin 2 (two) times daily. 12/30/18  Yes Johnson, Deborah B, MD  lisinopril (ZESTRIL) 10 MG tablet TAKE 1 TABLET (10 MG TOTAL) BY MOUTH DAILY. 02/20/19  Yes Newlin, Enobong, MD  senna-docusate (SENOKOT-S) 8.6-50 MG tablet Take 1 tablet by mouth at  bedtime as needed for mild constipation. 05/09/17  Yes Alekh, Kshitiz, MD  blood glucose meter kit and supplies KIT Dispense based on patient and insurance preference. Use up to four times daily as directed. (FOR ICD-9 250.00, 250.01). 11/09/16   Akula, Vijaya, MD  Blood Glucose Monitoring Suppl (TRUE METRIX METER) DEVI 1 each by Does not apply route 3 (three) times daily before meals. 05/14/17   Newlin, Enobong, MD  glucose blood (TRUE METRIX BLOOD GLUCOSE TEST) test strip Use 3 times daily before meals 12/30/18   Johnson, Deborah B, MD  glucose blood test strip Use as instructed 11/09/16   Akula, Vijaya, MD  Insulin Syringe-Needle U-100 (BD INSULIN SYRINGE ULTRAFINE) 31G X 15/64" 0.5 ML MISC 1 each by Does not apply route daily. 03/18/18   Newlin, Enobong, MD  Insulin Syringes, Disposable, U-100 0.3 ML MISC USE AS DIRECTED 08/14/17   Newlin, Enobong, MD  isosorbide mononitrate (IMDUR) 60 MG 24 hr tablet TAKE 1 TABLET (60 MG TOTAL) BY MOUTH DAILY. 02/20/19   Newlin, Enobong, MD  tamsulosin (FLOMAX) 0.4 MG CAPS capsule Take 1 capsule (0.4 mg total) by mouth at bedtime. Patient not taking: Reported on 12/13/2017 09/18/17   Wood, Case M, MD  TRUEPLUS LANCETS 28G MISC 1 each by Does not apply route 3 (three) times daily before meals. 05/14/17   Newlin, Enobong, MD    Current Facility-Administered Medications  Medication Dose Route Frequency Provider Last Rate Last Admin  . acetaminophen (TYLENOL) tablet 650 mg  650 mg Oral Q6H PRN Kim, James, MD       Or  . acetaminophen (TYLENOL) suppository 650 mg  650 mg Rectal Q6H PRN Kim, James, MD        Allergies as of 03/27/2019  . (No Known Allergies)    Family History  Problem Relation Age of Onset  . Cancer Brother   . Heart disease Sister     Social History   Socioeconomic History  . Marital status: Single    Spouse name: Not on file  . Number of children: Not on file  . Years of education: Not on file  . Highest education level: Not on file   Occupational History  . Not on file  Tobacco Use  . Smoking status: Never Smoker  . Smokeless tobacco: Never Used  Substance and Sexual Activity  . Alcohol use: No  . Drug use: Never  . Sexual activity: Not on file  Other Topics Concern  . Not on file  Social History Narrative   ** Merged History Encounter **       Lives with son Luis Escobar who comes with her to majority of visits and helps coordinate care for her. He translates for her and has signed and patient signed a release for this on 02/02/12.          Social Determinants of Health   Financial Resource Strain:   . Difficulty of Paying Living Expenses: Not on file  Food Insecurity:   . Worried About Running Out of Food in the Last   Year: Not on file  . Ran Out of Food in the Last Year: Not on file  Transportation Needs:   . Lack of Transportation (Medical): Not on file  . Lack of Transportation (Non-Medical): Not on file  Physical Activity:   . Days of Exercise per Week: Not on file  . Minutes of Exercise per Session: Not on file  Stress:   . Feeling of Stress : Not on file  Social Connections:   . Frequency of Communication with Friends and Family: Not on file  . Frequency of Social Gatherings with Friends and Family: Not on file  . Attends Religious Services: Not on file  . Active Member of Clubs or Organizations: Not on file  . Attends Club or Organization Meetings: Not on file  . Marital Status: Not on file  Intimate Partner Violence:   . Fear of Current or Ex-Partner: Not on file  . Emotionally Abused: Not on file  . Physically Abused: Not on file  . Sexually Abused: Not on file    Review of Systems: As per HPI, all others negative  Physical Exam: Vital signs in last 24 hours: Temp:  [98.8 F (37.1 C)] 98.8 F (37.1 C) (01/15 0434) Pulse Rate:  [69-92] 88 (01/15 0434) Resp:  [16-20] 16 (01/15 0434) BP: (144-160)/(54-80) 155/67 (01/15 0434) SpO2:  [96 %-100 %] 96 % (01/15 0434) Weight:  [99.6  kg] 99.6 kg (01/15 0432) Last BM Date: 03/27/19(pta) General:   Alert,  Well-developed, well-nourished, pleasant and cooperative in NAD Head:  Normocephalic and atraumatic. Eyes:  Sclera clear, no icterus.   Conjunctiva pink. Ears:  Normal auditory acuity. Nose:  No deformity, discharge,  or lesions. Mouth:  No deformity or lesions.  Oropharynx pink & moist. Neck:  Supple; no masses or thyromegaly. Lungs:  Clear throughout to auscultation.   No wheezes, crackles, or rhonchi. No acute distress. Heart:  Regular rate and rhythm; no murmurs, clicks, rubs,  or gallops. Abdomen:  Soft, obese, protuberant, nontender and nondistended. No masses, hepatosplenomegaly or hernias noted. Normal bowel sounds, without guarding, and without rebound.     Msk:  Symmetrical without gross deformities. Normal posture. Pulses:  Normal pulses noted. Extremities:  Without clubbing or edema. Neurologic:  Alert and  oriented x4;  grossly normal neurologically. Skin:  Intact without significant lesions or rashes. Psych:  Alert and cooperative. Normal mood and affect.   Lab Results: Recent Labs    03/27/19 1009 03/27/19 2107 03/28/19 0539  WBC 9.3 9.1 7.5  HGB 8.8* 8.0* 7.2*  HCT 29.3* 26.5* 24.1*  PLT 264 247 233   BMET Recent Labs    03/27/19 1009 03/28/19 0539  NA 143 144  K 4.3 4.0  CL 111 113*  CO2 25 24  GLUCOSE 102* 78  BUN 38* 32*  CREATININE 1.28* 1.17*  CALCIUM 8.1* 8.1*   LFT Recent Labs    03/28/19 0539  PROT 4.8*  ALBUMIN 2.6*  AST 18  ALT 13  ALKPHOS 48  BILITOT 0.5   PT/INR Recent Labs    03/27/19 2107  LABPROT 13.2  INR 1.0    Studies/Results: CT ABDOMEN PELVIS W CONTRAST  Result Date: 03/27/2019 CLINICAL DATA:  Diarrhea with blood clots onset yesterday, chills, weakness. EXAM: CT ABDOMEN AND PELVIS WITH CONTRAST TECHNIQUE: Multidetector CT imaging of the abdomen and pelvis was performed using the standard protocol following bolus administration of intravenous  contrast. CONTRAST:  80mL OMNIPAQUE IOHEXOL 300 MG/ML  SOLN COMPARISON:  None. FINDINGS: Lower chest:   No acute abnormality. Hepatobiliary: No focal liver abnormality is seen. No gallstones, gallbladder wall thickening, or biliary dilatation. Pancreas: Unremarkable. No pancreatic ductal dilatation or surrounding inflammatory changes. Spleen: Normal in size without focal abnormality. Adrenals/Urinary Tract: Adrenal glands are unremarkable. Kidneys are normal, without renal calculi, solid mass, or hydronephrosis. 2 small right renal cysts. Bladder is unremarkable. Stomach/Bowel: Stomach is within normal limits. No evidence of bowel wall thickening, distention, or inflammatory changes. Diverticulosis without evidence of diverticulitis. Vascular/Lymphatic: Normal caliber abdominal aorta with mild atherosclerosis. No lymphadenopathy. Reproductive: Uterus and bilateral adnexa are unremarkable. Other: No abdominal wall hernia or abnormality. No abdominopelvic ascites. Musculoskeletal: No acute or significant osseous findings. Grade 1 anterolisthesis of L4 on L5 secondary to bilateral facet arthropathy. Bilateral facet arthropathy throughout the lumbar spine. IMPRESSION: 1. No acute abdominal or pelvic pathology. 2. Extensive colonic diverticulosis without evidence of diverticulitis. 3.  Aortic Atherosclerosis (ICD10-I70.0). Electronically Signed   By: Hetal  Patel   On: 03/27/2019 12:12    Impression:  1.  Painless hematochezia. Suspect colonic diverticulosis. 2.   Acute blood loss diarrhea. 3.  CT scan abdomen with diverticulosis in colon. 4.  History TIA; on clopidigrel (now on hold).  Plan:  1.  Medical management; serial CBCs, volume repletion, transfusion PRBCs. 2.  Hold clopidigrel. 3.  Clear liquids today, NPO after midnight. 4.  Colonoscopy planned for tomorrow. 5.  Risks (bleeding, infection, bowel perforation that could require surgery, sedation-related changes in cardiopulmonary systems), benefits  (identification and possible treatment of source of symptoms, exclusion of certain causes of symptoms), and alternatives (watchful waiting, radiographic imaging studies, empiric medical treatment) of colonoscopy were explained to patient/family in detail and patient wishes to proceed. 6.  Next step in management pending colonoscopy findings.   LOS: 1 day   Iann Rodier M  03/28/2019, 10:37 AM  Cell [phone] If no answer or after 5 PM call [phone]  

## 2019-03-28 NOTE — Progress Notes (Signed)
Initial Nutrition Assessment  RD working remotely.   DOCUMENTATION CODES:   Obesity unspecified  INTERVENTION:  - will order Boost Breeze TID, each supplement provides 250 kcal and 9 grams of protein. - will order 30 mL Prostat BID, each supplement provides 100 kcal and 15 grams of protein. - will order daily multivitamin with minerals. - encourage PO intakes and continue to advance diet as medically feasible.    NUTRITION DIAGNOSIS:   Inadequate protein intake related to other (see comment)(current diet order) as evidenced by other (comment)(CLD does not meet estimated protein need.).  GOAL:   Patient will meet greater than or equal to 90% of their needs  MONITOR:   PO intake, Supplement acceptance, Diet advancement, Labs, Weight trends  REASON FOR ASSESSMENT:   Malnutrition Screening Tool  ASSESSMENT:   79 y.o. female with medical history of HTN, hyperlipidemia, type 2 DM, PVD, and h/o stroke. She presented to the ED on 1/14 with 1 day hx of rectal bleeding. She reported some diarrhea and loose stools and mild lower abdominal discomfort. She denied chills, cough, chest pain, palpitations, N/V, constipation, or black/dark stools.  Her diet was advanced from NPO to CLD today at 1050 with plan to return to NPO at 0001. Patient is Spanish-speaking. Per chart review, weight today is 219 lb and weight on 12/30/18 was 220 lb.   Per notes: - rectal bleeding--thought to be diverticular; GI consulted and plan for colonoscopy 1/16 - anemia   Labs reviewed; Cl: 113 mmol/l, BUN: 32 mg/dl, craetinine: 1.61 mg/dl, Ca: 8.1 mg/dl, GFR: 45 ml/min. Medications reviewed; 10 mg rectal dulcolax x1 dose 1/15.     NUTRITION - FOCUSED PHYSICAL EXAM:  unable to complete at this time.   Diet Order:   Diet Order            Diet NPO time specified  Diet effective midnight        Diet clear liquid Room service appropriate? Yes; Fluid consistency: Thin  Diet effective now               EDUCATION NEEDS:   No education needs have been identified at this time  Skin:  Skin Assessment: Reviewed RN Assessment  Last BM:  1/14  Height:   Ht Readings from Last 1 Encounters:  03/27/19 5\' 6"  (1.676 m)    Weight:   Wt Readings from Last 1 Encounters:  03/28/19 99.6 kg    Ideal Body Weight:  59.1 kg  BMI:  Body mass index is 35.44 kg/m.  Estimated Nutritional Needs:   Kcal:  1790-1990 kcal  Protein:  75-85 grams  Fluid:  >/= 2 L/day     03/30/19, MS, RD, LDN, [facility] Inpatient Clinical Dietitian Pager # [phone] After hours/weekend pager # [phone]

## 2019-03-29 ENCOUNTER — Inpatient Hospital Stay (HOSPITAL_COMMUNITY): Payer: Self-pay | Admitting: Certified Registered Nurse Anesthetist

## 2019-03-29 ENCOUNTER — Encounter (HOSPITAL_COMMUNITY)
Admission: EM | Disposition: A | Discharge status: Home / Self Care | Payer: Self-pay | Source: Home / Self Care | Attending: Internal Medicine

## 2019-03-29 HISTORY — PX: COLONOSCOPY WITH PROPOFOL: SHX5780

## 2019-03-29 HISTORY — PX: POLYPECTOMY: SHX5525

## 2019-03-29 LAB — COMPREHENSIVE METABOLIC PANEL
ALT: 14 U/L (ref 0–44)
AST: 25 U/L (ref 15–41)
Albumin: 2.8 g/dL — ABNORMAL LOW (ref 3.5–5.0)
Alkaline Phosphatase: 52 U/L (ref 38–126)
Anion gap: 8 (ref 5–15)
BUN: 25 mg/dL — ABNORMAL HIGH (ref 8–23)
CO2: 22 mmol/L (ref 22–32)
Calcium: 8.1 mg/dL — ABNORMAL LOW (ref 8.9–10.3)
Chloride: 112 mmol/L — ABNORMAL HIGH (ref 98–111)
Creatinine, Ser: 0.95 mg/dL (ref 0.44–1.00)
GFR calc Af Amer: >60 mL/min (ref >60)
GFR calc non Af Amer: 58 mL/min — ABNORMAL LOW (ref >60)
Glucose, Bld: 104 mg/dL — ABNORMAL HIGH (ref 70–99)
Potassium: 3.7 mmol/L (ref 3.5–5.1)
Sodium: 142 mmol/L (ref 135–145)
Total Bilirubin: 0.6 mg/dL (ref 0.3–1.2)
Total Protein: 5.2 g/dL — ABNORMAL LOW (ref 6.5–8.1)

## 2019-03-29 LAB — CBC
HCT: 29.4 % — ABNORMAL LOW (ref 36.0–46.0)
Hemoglobin: 8.9 g/dL — ABNORMAL LOW (ref 12.0–15.0)
MCH: 25.6 pg — ABNORMAL LOW (ref 26.0–34.0)
MCHC: 30.3 g/dL (ref 30.0–36.0)
MCV: 84.7 fL (ref 80.0–100.0)
Platelets: 212 10*3/uL (ref 150–400)
RBC: 3.47 MIL/uL — ABNORMAL LOW (ref 3.87–5.11)
RDW: 15.3 % (ref 11.5–15.5)
WBC: 7.8 10*3/uL (ref 4.0–10.5)
nRBC: 0 % (ref 0.0–0.2)

## 2019-03-29 LAB — HEMOGLOBIN A1C
Hgb A1c MFr Bld: 6.5 % — ABNORMAL HIGH (ref 4.8–5.6)
Mean Plasma Glucose: 139.85 mg/dL

## 2019-03-29 LAB — GLUCOSE, CAPILLARY
Glucose-Capillary: 95 mg/dL (ref 70–99)
Glucose-Capillary: 120 mg/dL — ABNORMAL HIGH (ref 70–99)

## 2019-03-29 SURGERY — COLONOSCOPY WITH PROPOFOL
Anesthesia: Monitor Anesthesia Care | Laterality: Left

## 2019-03-29 MED ORDER — ISOSORBIDE MONONITRATE ER 60 MG PO TB24: 60.0000 mg | ORAL_TABLET | Freq: Every day | ORAL | Status: DC

## 2019-03-29 MED ORDER — TRUE METRIX METER DEVI: 1.0000 | Freq: Three times a day (TID) | Status: DC

## 2019-03-29 MED ORDER — GABAPENTIN 300 MG PO CAPS: 300.0000 mg | ORAL_CAPSULE | Freq: Every day | ORAL | Status: DC

## 2019-03-29 MED ORDER — LABETALOL HCL 5 MG/ML IV SOLN
10.0000 mg | Freq: Once | INTRAVENOUS | Status: AC
  Administered 2019-03-29: 10 mg via INTRAVENOUS
  Filled 2019-03-29: qty 4

## 2019-03-29 MED ORDER — PROPOFOL 500 MG/50ML IV EMUL
INTRAVENOUS | Status: DC | PRN
  Administered 2019-03-29: 125 ug/kg/min via INTRAVENOUS

## 2019-03-29 MED ORDER — AMLODIPINE BESYLATE 10 MG PO TABS: 5.0000 mg | ORAL_TABLET | Freq: Every day | ORAL | 2 refills | Status: DC

## 2019-03-29 MED ORDER — LACTATED RINGERS IV SOLN: INTRAVENOUS | Status: DC | PRN

## 2019-03-29 MED ORDER — PROPOFOL 10 MG/ML IV BOLUS
INTRAVENOUS | Status: DC | PRN
  Administered 2019-03-29: 70 mg via INTRAVENOUS

## 2019-03-29 MED ORDER — AMLODIPINE BESYLATE 10 MG PO TABS
10.0000 mg | ORAL_TABLET | Freq: Every day | ORAL | Status: DC
  Administered 2019-03-29: 10 mg via ORAL
  Filled 2019-03-29: qty 1

## 2019-03-29 MED ORDER — TRUEPLUS LANCETS 28G MISC: 1.0000 | Freq: Three times a day (TID) | Status: DC

## 2019-03-29 MED ORDER — INSULIN GLARGINE 100 UNIT/ML ~~LOC~~ SOLN: 32.0000 [IU] | Freq: Two times a day (BID) | SUBCUTANEOUS | Status: DC

## 2019-03-29 MED ORDER — SENNOSIDES-DOCUSATE SODIUM 8.6-50 MG PO TABS: 1.0000 | ORAL_TABLET | Freq: Every evening | ORAL | Status: DC | PRN

## 2019-03-29 MED ORDER — LISINOPRIL 10 MG PO TABS
10.0000 mg | ORAL_TABLET | Freq: Every day | ORAL | Status: DC
  Administered 2019-03-29: 10 mg via ORAL
  Filled 2019-03-29: qty 1

## 2019-03-29 MED ORDER — CLOPIDOGREL BISULFATE 75 MG PO TABS: 75.0000 mg | ORAL_TABLET | Freq: Every day | ORAL | 2 refills | Status: DC

## 2019-03-29 MED ORDER — "INSULIN SYRINGE-NEEDLE U-100 31G X 15/64"" 0.5 ML MISC": 1.0000 | Freq: Every day | Status: DC

## 2019-03-29 MED ORDER — ASPIRIN 81 MG PO TABS: 325.0000 mg | ORAL_TABLET | Freq: Every day | ORAL | 11 refills | Status: DC

## 2019-03-29 MED ORDER — LIDOCAINE 2% (20 MG/ML) 5 ML SYRINGE
INTRAMUSCULAR | Status: DC | PRN
  Administered 2019-03-29: 60 mg via INTRAVENOUS

## 2019-03-29 MED ORDER — LABETALOL HCL 5 MG/ML IV SOLN
INTRAVENOUS | Status: AC
  Filled 2019-03-29: qty 4

## 2019-03-29 SURGICAL SUPPLY — 22 items

## 2019-03-29 NOTE — Interval H&P Note (Signed)
History and Physical Interval Note: 78/female with no prior colonoscopy presented with painless hematochezia, she is on plavix for history of stroke.  03/29/2019 9:15 AM  Meghan Ford  has presented today for colonoscopy, with the diagnosis of hematochezia, anemia.  The various methods of treatment have been discussed with the patient and family. After consideration of risks, benefits and other options for treatment, the patient has consented to  Procedure(s): COLONOSCOPY WITH PROPOFOL (Left) as a surgical intervention.  The patient's history has been reviewed, patient examined, no change in status, stable for surgery.  I have reviewed the patient's chart and labs.  Questions were answered to the patient's satisfaction.     Kerin Salen

## 2019-03-29 NOTE — Op Note (Signed)
[facility] Patient Name: Meghan Ford Procedure Date: 03/29/2019 MRN: [id number] Attending MD: Kerin Salen , MD Date of Birth: 08-24-1940 CSN: [id number] Age: 79 Admit Type: Inpatient Procedure:                Colonoscopy Indications:              This is the patient's first colonoscopy, Rectal                            bleeding Providers:                Kerin Salen, MD, Glory Rosebush, RN, Arlee Muslim                            Tech., Technician, Sherron Flemings, CRNA Referring MD:             [facility] Medicines:                Monitored Anesthesia Care Complications:            No immediate complications. Estimated blood loss:                            None. Estimated Blood Loss:     Estimated blood loss: none. Procedure:                Pre-Anesthesia Assessment:                           - Prior to the procedure, a History and Physical                            was performed, and patient medications and                            allergies were reviewed. The patient's tolerance of                            previous anesthesia was also reviewed. The risks                            and benefits of the procedure and the sedation                            options and risks were discussed with the patient.                            All questions were answered, and informed consent                            was obtained. Prior Anticoagulants: The patient has                            taken Plavix (clopidogrel), last dose was 3 days                            prior to  procedure. ASA Grade Assessment: III - A                            patient with severe systemic disease. After                            reviewing the risks and benefits, the patient was                            deemed in satisfactory condition to undergo the                            procedure.                           After obtaining informed consent, the colonoscope                          was passed under direct vision. Throughout the                            procedure, the patient's blood pressure, pulse, and                            oxygen saturations were monitored continuously. The                            PCF-H190DL ([id number] Olympus pediatric colonscope                            was introduced through the anus and advanced to the                            the cecum, identified by appendiceal orifice and                            ileocecal valve. The colonoscopy was performed                            without difficulty. The patient tolerated the                            procedure well. The quality of the bowel                            preparation was good. Scope In: 9:39:41 AM Scope Out: 9:56:36 AM Scope Withdrawal Time: 0 hours 10 minutes 52 seconds  Total Procedure Duration: 0 hours 16 minutes 55 seconds  Findings:      The perianal and digital rectal examinations were normal.      Multiple small and large-mouthed diverticula were found in the sigmoid       colon, descending colon, transverse colon and hepatic flexure.      A 8 mm polyp was found in the sigmoid colon. The polyp was sessile. The       polyp was removed  with a hot snare. Resection and retrieval were       complete.      The exam was otherwise without abnormality on direct and retroflexion       views. Impression:               - Diverticulosis in the sigmoid colon, in the                            descending colon, in the transverse colon and at                            the hepatic flexure.                           - One 8 mm polyp in the sigmoid colon, removed with                            a hot snare. Resected and retrieved.                           - The examination was otherwise normal on direct                            and retroflexion views. Moderate Sedation:      Patient did not receive moderate sedation for this procedure, but       instead  received monitored anesthesia care. Recommendation:           - High fiber diet.                           - Continue present medications.                           - Await pathology results.                           - Repeat colonoscopy is not recommended for                            screening purposes.                           - Resume Plavix (clopidogrel) at prior dose in 2                            days. Procedure Code(s):        --- Professional ---                           [phone], Colonoscopy, flexible; with removal of                            tumor(s), polyp(s), or other lesion(s) by snare                            technique Diagnosis Code(s):        ---  Professional ---                           K63.5, Polyp of colon                           K62.5, Hemorrhage of anus and rectum                           K57.30, Diverticulosis of large intestine without                            perforation or abscess without bleeding CPT copyright 2019 [employer]. All rights reserved. The codes documented in this report are preliminary and upon coder review may  be revised to meet current compliance requirements. Ronnette Juniper, MD 03/29/2019 10:06:29 AM This report has been signed electronically. Number of Addenda: 0

## 2019-03-29 NOTE — Brief Op Note (Signed)
03/27/2019 - 03/29/2019  9:58 AM  PATIENT:  Rhett Bannister  79 y.o. female  PRE-OPERATIVE DIAGNOSIS:  hematochezia, anemia  POST-OPERATIVE DIAGNOSIS:  sigmoid polyp removed hot snare  PROCEDURE:  Procedure(s): COLONOSCOPY WITH PROPOFOL (Left) POLYPECTOMY  SURGEON:  Surgeon(s) and Role:    Ronnette Juniper, MD - Primary  PHYSICIAN ASSISTANT:   ASSISTANTS: Jenell Milliner   ANESTHESIA:   MAC  EBL:  None  BLOOD ADMINISTERED:none  DRAINS: none   LOCAL MEDICATIONS USED:  NONE  SPECIMEN:  Biopsy / Limited Resection  DISPOSITION OF SPECIMEN:  PATHOLOGY  COUNTS:  YES  TOURNIQUET:  * No tourniquets in log *  DICTATION: .Dragon Dictation  PLAN OF CARE: Admit to inpatient   PATIENT DISPOSITION:  PACU - hemodynamically stable.   Delay start of Pharmacological VTE agent (>24hrs) due to surgical blood loss or risk of bleeding: yes

## 2019-03-29 NOTE — Progress Notes (Signed)
Pt leaving this afternoon with her son. Discharge instructions given/explained with pt and her son verbalizing understanding. Pt and her son aware of followup appointments.

## 2019-03-29 NOTE — Transfer of Care (Signed)
Immediate Anesthesia Transfer of Care Note  Patient: Meghan Ford  Procedure(s) Performed: Procedure(s): COLONOSCOPY WITH PROPOFOL (Left) POLYPECTOMY  Patient Location: PACU and Endoscopy Unit  Anesthesia Type:General  Level of Consciousness: awake, alert  and oriented  Airway & Oxygen Therapy: Patient Spontanous Breathing and Patient connected to nasal cannula oxygen  Post-op Assessment: Report given to RN and Post -op Vital signs reviewed and stable  Post vital signs: Reviewed and stable  Last Vitals:  Vitals:   03/29/19 0831 03/29/19 0917  BP: (!) 195/86 (!) 211/77  Pulse: 80 76  Resp: 16 16  Temp: 37.2 C 37.1 C  SpO2: 98% 99%    Complications: No apparent anesthesia complications

## 2019-03-29 NOTE — Anesthesia Preprocedure Evaluation (Signed)
Anesthesia Evaluation  Patient identified by MRN, date of birth, ID band Patient awake    Reviewed: Allergy & Precautions, H&P , NPO status , Patient's Chart, lab work & pertinent test results  Airway Mallampati: II   Neck ROM: full    Dental   Pulmonary neg pulmonary ROS,    breath sounds clear to auscultation       Cardiovascular hypertension, + Peripheral Vascular Disease   Rhythm:regular Rate:Normal     Neuro/Psych  Headaches, CVA    GI/Hepatic GI bleeding   Endo/Other  diabetes, Type 2  Renal/GU stones     Musculoskeletal  (+) Arthritis ,   Abdominal   Peds  Hematology  (+) Blood dyscrasia, anemia ,   Anesthesia Other Findings   Reproductive/Obstetrics                             Anesthesia Physical Anesthesia Plan  ASA: III  Anesthesia Plan: MAC   Post-op Pain Management:    Induction: Intravenous  PONV Risk Score and Plan: 2 and Propofol infusion and Treatment may vary due to age or medical condition  Airway Management Planned: Nasal Cannula  Additional Equipment:   Intra-op Plan:   Post-operative Plan:   Informed Consent: I have reviewed the patients History and Physical, chart, labs and discussed the procedure including the risks, benefits and alternatives for the proposed anesthesia with the patient or authorized representative who has indicated his/her understanding and acceptance.       Plan Discussed with: CRNA, Anesthesiologist and Surgeon  Anesthesia Plan Comments:         Anesthesia Quick Evaluation

## 2019-03-29 NOTE — Discharge Summary (Signed)
Physician Discharge Summary  Meghan Ford WUJ:[id number] DOB: [birth year] DOA: 03/27/2019  PCP: Charlott Rakes, MD  Admit date: 03/27/2019 Discharge date: 03/29/2019  Admitted From: Home Disposition: Home Recommendations for Outpatient Follow-up:  1. Follow up with PCP in 1-2 weeks 2. Please obtain BMP/CBC in one week 3. Please follow up on the following pending results: Biopsy of the polyp  Home Health: None Equipment/Devices: None Discharge Condition: Stable and improved CODE STATUS: Full code Diet recommendation: Cardiac  brief/Interim Summary:79 y.o.female,Whypertension, hyperlipidemia, Dm2, PVD, h/o stroke, presents with c/o rectal bleedingsince yesterday. Pt notes slight diarrhea, loose stool as well as lower abdominal discomfort, bilaterally, mild. No recent colonoscopy . Pt denies fever, chills, cough, cp, palp, sob, n/v, epigastric, pain, constipation, black stool. Pt presented to ED due to rectal bleeding.   Discharge Diagnoses:  Principal Problem:   Rectal bleeding Active Problems:   Hypertension   Diabetes mellitus type II, uncontrolled ([place])   Dyslipidemia   History of CVA (cerebrovascular accident)   Anemia   LGI bleed   GIB (gastrointestinal bleeding)    #1 lower GI bleed likely secondary to diverticulosis-patient had a colonoscopy 03/29/2019 -diverticulosis of the sigmoid transverse and descending colon and hepatic flexure with no evidence of active or recent bleeding 8 mm polyp noted in the sigmoid colon removed via snare polypectomy biopsy pending.  CT of the abdomen shows diverticulosis without evidence of diverticulitis.  Restart aspirin and Plavix 03/31/2019  #2 history of TIA restart Plavix after 2 days 03/31/2019  #3 hypertension on amlodipine 10 mg daily, lisinopril 10 mg daily and Imdur at home.  Restart lisinopril and Imdur and decrease amlodipine to 5 mg daily.  #4 acute blood loss anemia hemoglobin on admission is 8.8 which is down  from 13.9 on October 2020.  Patient received 1 unit of blood transfusion during this hospital stay.  Hemoglobin on discharge is 8.9.  If she continues to be anemic she will benefit from a hematology evaluation as an outpatient.   #5 history of PVD restart aspirin Plavix 03/31/2019  #6 hyperlipidemia on Lipitor  #7 type 2 diabetes-continue insulin.    Nutrition Problem: Inadequate protein intake Etiology: other (see comment)(current diet order)    Signs/Symptoms: other (comment)(CLD does not meet estimated protein need.)     Interventions: Prostat, Boost Breeze, MVI  Estimated body mass index is 36.76 kg/m as calculated from the following:   Height as of this encounter: 5' 5" (1.651 m).   Weight as of this encounter: 100.2 kg.  Discharge Instructions  Discharge Instructions    Call MD for:  persistant dizziness or light-headedness   Complete by: As directed    Call MD for:  persistant nausea and vomiting   Complete by: As directed    Call MD for:  severe uncontrolled pain   Complete by: As directed    Call MD for:  temperature >100.4   Complete by: As directed    Diet - low sodium heart healthy   Complete by: As directed    Increase activity slowly   Complete by: As directed      Allergies as of 03/29/2019   No Known Allergies     Medication List    STOP taking these medications   tamsulosin 0.4 MG Caps capsule Commonly known as: Flomax     TAKE these medications   amLODipine 10 MG tablet Commonly known as: NORVASC Take 0.5 tablets (5 mg total) by mouth daily. What changed: how much to take   aspirin  81 MG tablet Take 4 tablets (325 mg total) by mouth daily.   atorvastatin 40 MG tablet Commonly known as: LIPITOR TAKE 2 TABLETS BY MOUTH DAILY   blood glucose meter kit and supplies Kit Dispense based on patient and insurance preference. Use up to four times daily as directed. (FOR ICD-9 250.00, 250.01).   clopidogrel 75 MG tablet Commonly known as:  PLAVIX Take 1 tablet (75 mg total) by mouth daily. Start taking Plavix on 03/31/2019. What changed: additional instructions   gabapentin 300 MG capsule Commonly known as: NEURONTIN TAKE 1 CAPSULE (300 MG TOTAL) BY MOUTH AT BEDTIME.   glucose blood test strip Use as instructed   True Metrix Blood Glucose Test test strip Generic drug: glucose blood Use 3 times daily before meals   insulin glargine 100 UNIT/ML injection Commonly known as: Lantus Inject 0.32 mLs (32 Units total) into the skin 2 (two) times daily.   Insulin Syringe-Needle U-100 31G X 15/64" 0.5 ML Misc Commonly known as: BD Insulin Syringe Ultrafine 1 each by Does not apply route daily.   Insulin Syringes (Disposable) U-100 0.3 ML Misc USE AS DIRECTED   isosorbide mononitrate 60 MG 24 hr tablet Commonly known as: IMDUR TAKE 1 TABLET (60 MG TOTAL) BY MOUTH DAILY.   lisinopril 10 MG tablet Commonly known as: ZESTRIL TAKE 1 TABLET (10 MG TOTAL) BY MOUTH DAILY.   senna-docusate 8.6-50 MG tablet Commonly known as: Senokot-S Take 1 tablet by mouth at bedtime as needed for mild constipation.   True Metrix Meter Devi 1 each by Does not apply route 3 (three) times daily before meals.   TRUEplus Lancets 28G Misc 1 each by Does not apply route 3 (three) times daily before meals.      Follow-up Information    Charlott Rakes, MD Follow up.   Specialty: Family Medicine Contact information: [place] [address] [phone]          No Known Allergies  Consultations: [facility]  Procedures/Studies: CT ABDOMEN PELVIS W CONTRAST  Result Date: 03/27/2019 CLINICAL DATA:  Diarrhea with blood clots onset yesterday, chills, weakness. EXAM: CT ABDOMEN AND PELVIS WITH CONTRAST TECHNIQUE: Multidetector CT imaging of the abdomen and pelvis was performed using the standard protocol following bolus administration of intravenous contrast. CONTRAST:  8m OMNIPAQUE IOHEXOL 300 MG/ML  SOLN  COMPARISON:  None. FINDINGS: Lower chest: No acute abnormality. Hepatobiliary: No focal liver abnormality is seen. No gallstones, gallbladder wall thickening, or biliary dilatation. Pancreas: Unremarkable. No pancreatic ductal dilatation or surrounding inflammatory changes. Spleen: Normal in size without focal abnormality. Adrenals/Urinary Tract: Adrenal glands are unremarkable. Kidneys are normal, without renal calculi, solid mass, or hydronephrosis. 2 small right renal cysts. Bladder is unremarkable. Stomach/Bowel: Stomach is within normal limits. No evidence of bowel wall thickening, distention, or inflammatory changes. Diverticulosis without evidence of diverticulitis. Vascular/Lymphatic: Normal caliber abdominal aorta with mild atherosclerosis. No lymphadenopathy. Reproductive: Uterus and bilateral adnexa are unremarkable. Other: No abdominal wall hernia or abnormality. No abdominopelvic ascites. Musculoskeletal: No acute or significant osseous findings. Grade 1 anterolisthesis of L4 on L5 secondary to bilateral facet arthropathy. Bilateral facet arthropathy throughout the lumbar spine. IMPRESSION: 1. No acute abdominal or pelvic pathology. 2. Extensive colonic diverticulosis without evidence of diverticulitis. 3.  Aortic Atherosclerosis (ICD10-I70.0). Electronically Signed   By: HKathreen Devoid  On: 03/27/2019 12:12    (Echo, Carotid, EGD, Colonoscopy, ERCP)    Subjective:  Patient is resting in bed anxious about the procedure and drinking GoLYTELY  Discharge Exam: Vitals:   03/29/19 1020 03/29/19 1035  BP: (!) 168/43 (!) 147/66  Pulse: 71 73  Resp: 17 20  Temp:  99.4 F (37.4 C)  SpO2: 97% 100%   Vitals:   03/29/19 1004 03/29/19 1010 03/29/19 1020 03/29/19 1035  BP: (!) 138/47 (!) 160/72 (!) 168/43 (!) 147/66  Pulse: 72 71 71 73  Resp: 18 (!) _0 Temp: 97.7 F (36.5 C)   99.4 F (37.4 C)  TempSrc: Temporal   Oral  SpO2: 100% 99% 97% 100%  Weight:      Height:         General: Pt is alert, awake, not in acute distress Cardiovascular: RRR, S1/S2 +, no rubs, no gallops Respiratory: CTA bilaterally, no wheezing, no rhonchi Abdominal: Soft, NT, ND, bowel sounds + Extremities: no edema, no cyanosis    The results of significant diagnostics from this hospitalization (including imaging, microbiology, ancillary and laboratory) are listed below for reference.     Microbiology: Recent Results (from the past 240 hour(s))  Respiratory Panel by RT PCR (Flu A&B, Covid) - Nasopharyngeal Swab     Status: None   Collection Time: 03/27/19 12:20 PM   Specimen: Nasopharyngeal Swab  Result Value Ref Range Status   SARS Coronavirus 2 by RT PCR NEGATIVE NEGATIVE Final    Comment: (NOTE) SARS-CoV-2 target nucleic acids are NOT DETECTED. The SARS-CoV-2 RNA is generally detectable in upper respiratoy specimens during the acute phase of infection. The lowest concentration of SARS-CoV-2 viral copies this assay can detect is 131 copies/mL. A negative result does not preclude SARS-Cov-2 infection and should not be used as the sole basis for treatment or other patient management decisions. A negative result may occur with  improper specimen collection/handling, submission of specimen other than nasopharyngeal swab, presence of viral mutation(s) within the areas targeted by this assay, and inadequate number of viral copies (<131 copies/mL). A negative result must be combined with clinical observations, patient history, and epidemiological information. The expected result is Negative. Fact Sheet for Patients:  PinkCheek.be Fact Sheet for Healthcare Providers:  GravelBags.it This test is not yet ap proved or cleared by the [place] FDA and  has been authorized for detection and/or diagnosis of SARS-CoV-2 by FDA under an Emergency Use Authorization (EUA). This EUA will remain  in effect (meaning this test can  be used) for the duration of the COVID-19 declaration under Section 564(b)(1) of the Act, 21 U.S.C. section 360bbb-3(b)(1), unless the authorization is terminated or revoked sooner.    Influenza A by PCR NEGATIVE NEGATIVE Final   Influenza B by PCR NEGATIVE NEGATIVE Final    Comment: (NOTE) The Xpert Xpress SARS-CoV-2/FLU/RSV assay is intended as an aid in  the diagnosis of influenza from Nasopharyngeal swab specimens and  should not be used as a sole basis for treatment. Nasal washings and  aspirates are unacceptable for Xpert Xpress SARS-CoV-2/FLU/RSV  testing. Fact Sheet for Patients: PinkCheek.be Fact Sheet for Healthcare Providers: GravelBags.it This test is not yet approved or cleared by the [place] FDA and  has been authorized for detection and/or diagnosis of SARS-CoV-2 by  FDA under an Emergency Use Authorization (EUA). This EUA will remain  in effect (meaning this test can be used) for the duration of the  Covid-19 declaration under Section 564(b)(1) of the Act, 21  U.S.C. section 360bbb-3(b)(1), unless the authorization is  terminated or revoked. Performed at [facility], [place] [address] [place], [place] [address]  Labs: BNP (last 3 results) No results for input(s): BNP in the last 8760 hours. Basic Metabolic Panel: Recent Labs  Lab 03/27/19 1009 03/28/19 0539 03/29/19 0621  NA 143 144 142  K 4.3 4.0 3.7  CL 111 113* 112*  CO2 _0 GLUCOSE 102* 78 104*  BUN 38* 32* 25*  CREATININE 1.28* 1.17* 0.95  CALCIUM 8.1* 8.1* 8.1*   Liver Function Tests: Recent Labs  Lab 03/27/19 1009 03/28/19 0539 03/29/19 0621  AST _1 ALT _2 ALKPHOS 57 48 52  BILITOT 0.3 0.5 0.6  PROT 5.2* 4.8* 5.2*  ALBUMIN 2.7* 2.6* 2.8*   No results for input(s): LIPASE, AMYLASE in the last 168 hours. No results for input(s): AMMONIA in the last 168 hours. CBC: Recent Labs  Lab  03/27/19 1009 03/27/19 2107 03/28/19 0539 03/29/19 0621  WBC 9.3 9.1 7.5 7.8  NEUTROABS 6.0  --   --   --   HGB 8.8* 8.0* 7.2* 8.9*  HCT 29.3* 26.5* 24.1* 29.4*  MCV 83.2 84.1 84.3 84.7  PLT 264 247 233 212   Cardiac Enzymes: No results for input(s): CKTOTAL, CKMB, CKMBINDEX, TROPONINI in the last 168 hours. BNP: Invalid input(s): POCBNP CBG: Recent Labs  Lab 03/28/19 1700 03/28/19 2150 03/29/19 0754 03/29/19 1145  GLUCAP 145* 83 95 120*   D-Dimer No results for input(s): DDIMER in the last 72 hours. Hgb A1c Recent Labs    03/29/19 0621  HGBA1C 6.5*   Lipid Profile No results for input(s): CHOL, HDL, LDLCALC, TRIG, CHOLHDL, LDLDIRECT in the last 72 hours. Thyroid function studies No results for input(s): TSH, T4TOTAL, T3FREE, THYROIDAB in the last 72 hours.  Invalid input(s): FREET3 Anemia work up Recent Labs    03/28/19 0539  VITAMINB12 246  FERRITIN 22  TIBC 257  IRON 42   Urinalysis    Component Value Date/Time   COLORURINE YELLOW 07/17/2017 [place] 07/17/2017 0741   LABSPEC 1.018 07/17/2017 0741   PHURINE 6.0 07/17/2017 0741   GLUCOSEU NEGATIVE 07/17/2017 0741   HGBUR NEGATIVE 07/17/2017 0741   BILIRUBINUR NEGATIVE 07/17/2017 0741   BILIRUBINUR negative 08/25/2015 1515   KETONESUR NEGATIVE 07/17/2017 0741   PROTEINUR 100 (A) 07/17/2017 0741   UROBILINOGEN 1.0 08/25/2015 1515   UROBILINOGEN 0.2 10/27/2014 2321   NITRITE NEGATIVE 07/17/2017 0741   LEUKOCYTESUR SMALL (A) 07/17/2017 0741   Sepsis Labs Invalid input(s): PROCALCITONIN,  WBC,  LACTICIDVEN Microbiology Recent Results (from the past 240 hour(s))  Respiratory Panel by RT PCR (Flu A&B, Covid) - Nasopharyngeal Swab     Status: None   Collection Time: 03/27/19 12:20 PM   Specimen: Nasopharyngeal Swab  Result Value Ref Range Status   SARS Coronavirus 2 by RT PCR NEGATIVE NEGATIVE Final    Comment: (NOTE) SARS-CoV-2 target nucleic acids are NOT DETECTED. The  SARS-CoV-2 RNA is generally detectable in upper respiratoy specimens during the acute phase of infection. The lowest concentration of SARS-CoV-2 viral copies this assay can detect is 131 copies/mL. A negative result does not preclude SARS-Cov-2 infection and should not be used as the sole basis for treatment or other patient management decisions. A negative result may occur with  improper specimen collection/handling, submission of specimen other than nasopharyngeal swab, presence of viral mutation(s) within the areas targeted by this assay, and inadequate number of viral copies (<131 copies/mL). A negative result must be combined with clinical observations, patient history, and epidemiological information. The expected result is  Negative. Fact Sheet for Patients:  PinkCheek.be Fact Sheet for Healthcare Providers:  GravelBags.it This test is not yet ap proved or cleared by the [place] FDA and  has been authorized for detection and/or diagnosis of SARS-CoV-2 by FDA under an Emergency Use Authorization (EUA). This EUA will remain  in effect (meaning this test can be used) for the duration of the COVID-19 declaration under Section 564(b)(1) of the Act, 21 U.S.C. section 360bbb-3(b)(1), unless the authorization is terminated or revoked sooner.    Influenza A by PCR NEGATIVE NEGATIVE Final   Influenza B by PCR NEGATIVE NEGATIVE Final    Comment: (NOTE) The Xpert Xpress SARS-CoV-2/FLU/RSV assay is intended as an aid in  the diagnosis of influenza from Nasopharyngeal swab specimens and  should not be used as a sole basis for treatment. Nasal washings and  aspirates are unacceptable for Xpert Xpress SARS-CoV-2/FLU/RSV  testing. Fact Sheet for Patients: PinkCheek.be Fact Sheet for Healthcare Providers: GravelBags.it This test is not yet approved or cleared by the [place] FDA and  has been authorized for detection and/or diagnosis of SARS-CoV-2 by  FDA under an Emergency Use Authorization (EUA). This EUA will remain  in effect (meaning this test can be used) for the duration of the  Covid-19 declaration under Section 564(b)(1) of the Act, 21  U.S.C. section 360bbb-3(b)(1), unless the authorization is  terminated or revoked. Performed at [facility], [place] [address] [place], [place] [address]      Time coordinating discharge: 37 minutes  SIGNED:   Georgette Shell, MD  [facility] 03/29/2019, 12:30 PM Pager   If 7PM-7AM, please contact night-coverage [email or web address] Password [email or web address]

## 2019-03-29 NOTE — Progress Notes (Signed)
Pt has returned to Room 1517 from Endo. Alert and VS WNL. Telemetry reapplied.

## 2019-03-29 NOTE — Op Note (Signed)
Colonoscopy was performed for painless hematochezia. Patient was off Plavix for 3 days.   Findings: Diverticulosis noted in sigmoid, descending, transverse and hepatic flexure. There was no evidence of recent or active bleeding. 8 mm polyp noted in sigmoid colon, removed via snare polypectomy, no bleeding noted thereafter.   Recommendations: Resume high-fiber diet. Resume Plavix in 2 days, if needed. Patient likely had diverticular bleeding which seems to have resolved. Okay to DC from GI standpoint. Pathology may be followed as outpatient. No further colonoscopy recommended for surveillance due to age.  Kerin Salen, MD

## 2019-03-30 LAB — FOLATE RBC
Folate, Hemolysate: 300 ng/mL
Folate, RBC: 1266 ng/mL (ref >498)
Hematocrit: 23.7 % — ABNORMAL LOW (ref 34.0–46.6)

## 2019-03-31 ENCOUNTER — Encounter: Payer: Self-pay | Admitting: *Deleted

## 2019-03-31 ENCOUNTER — Telehealth: Payer: Self-pay

## 2019-03-31 LAB — TYPE AND SCREEN
ABO/RH(D): O POS
Antibody Screen: NEGATIVE
Unit division: 0

## 2019-03-31 LAB — BPAM RBC
Blood Product Expiration Date: 202102122359
ISSUE DATE / TIME: 202101151814
Unit Type and Rh: 5100

## 2019-03-31 MED FILL — ?AMLODIPINE BESYLATE 10 MG: 10 | 30 days supply | Qty: 15 | Fill #0

## 2019-03-31 MED FILL — CLOPIDOGREL 75 MG TABLET: 75 | 30 days supply | Qty: 30 | Fill #0

## 2019-03-31 NOTE — Telephone Encounter (Signed)
Opened in error

## 2019-03-31 NOTE — Telephone Encounter (Signed)
Transition Care Management Follow-up Telephone Call  Call completed with patient's sone, Midge Minium - DPR on file to speak with him.  The patient was not with him at the time of the call.  The call was initially made with assistance of Spanish interpreter # [phone] /[employer]. But then Alecia Lemming stated he speaks [place] and did not need an interpreter.     Date of discharge and from where: 03/29/2019, [facility]   How have you been since you were released from the hospital? He son stated that she is " doing good."   Any questions or concerns? None reported at this time . Alecia Lemming said she has not reported any bleeding.   She will need to apply for [facility] Financial Assistance/ [employer]. As per her son, she does not have a permanent social security number and is not eligible for medicare. They said she had received emergency medicaid in the past but they have not applied for permanent medicaid.   Items Reviewed:  Did the pt receive and understand the discharge instructions provided? He stated that they received the instructions but he did not have them with him as they (instructions) were in the car..   Medications obtained and verified? Yes, he said that they have all medications, including the new ones and , he did not have any questions. He said that they were told to have her start the plavix on 04/01/2019 yet the AVS indicates to start it 03/31/2019.  He explained that the family sets up a medication box for her weekly. Encouraged him to call the clinic if he has any questions after reviewing the med list again as she ash new medications and is to stop taking the tamsulosin.   Any new allergies since your discharge? None reported.   Do you have support at home? she lives alone but family provides support when needed. He said that they check on her every day.   Other (ie: DME, Home Health, etc) no home health ordered.  Alecia Lemming said that she has a glucometer and uses it but  has not used it today. He was not able to give a range of what her blood sugars run.   Functional Questionnaire: (I = Independent and D = Dependent) ADL's:independent. Son/daughter will provide assist when needed. She has a cane to use if needed.   Follow up appointments reviewed:    PCP Hospital f/u appt confirmed? Appointment scheduled with Dr Alvis Lemmings 04/15/2019 @ 1050.   Specialist Hospital f/u appt confirmed? None scheduled at this time   Are transportation arrangements needed? No, family will provide transporation  If their condition worsens, is the pt aware to call  their PCP or go to the ED? yes  Was the patient provided with contact information for the PCP's office or ED? They have the phone number for the clinic  Was the pt encouraged to call back with questions or concerns?yes

## 2019-04-01 LAB — SURGICAL PATHOLOGY

## 2019-04-03 NOTE — Anesthesia Postprocedure Evaluation (Signed)
Anesthesia Post Note  Patient: Meghan Ford  Procedure(s) Performed: COLONOSCOPY WITH PROPOFOL (Left ) POLYPECTOMY     Patient location during evaluation: Endoscopy Anesthesia Type: MAC Level of consciousness: awake and alert Pain management: pain level controlled Vital Signs Assessment: post-procedure vital signs reviewed and stable Respiratory status: spontaneous breathing, nonlabored ventilation, respiratory function stable and patient connected to nasal cannula oxygen Cardiovascular status: blood pressure returned to baseline and stable Postop Assessment: no apparent nausea or vomiting Anesthetic complications: no    Last Vitals:  Vitals:   03/29/19 1035 03/29/19 1405  BP: (!) 147/66 (!) 142/57  Pulse: 73 74  Resp: 20 18  Temp: 37.4 C 37.6 C  SpO2: 100% 100%    Last Pain:  Vitals:   03/29/19 1405  TempSrc: Oral  PainSc:                  [place] S

## 2019-04-15 ENCOUNTER — Ambulatory Visit: Payer: Self-pay | Admitting: Family Medicine

## 2019-04-18 MED FILL — TRUE METRIX TEST STRIP: 33 days supply | Qty: 100 | Fill #0

## 2019-04-21 ENCOUNTER — Other Ambulatory Visit: Payer: Self-pay | Admitting: Family Medicine

## 2019-04-21 DIAGNOSIS — E1142 Type 2 diabetes mellitus with diabetic polyneuropathy: Secondary | ICD-10-CM

## 2019-04-21 MED FILL — !LANTUS 100 UNITS/ML VIAL: 100 | 26 days supply | Qty: 20 | Fill #0

## 2019-04-21 MED FILL — ISOSORBIDE MN ER 60 MG TAB: 60 | 30 days supply | Qty: 30 | Fill #1

## 2019-04-21 MED FILL — LISINOPRIL 10 MG TABS: 10 | 30 days supply | Qty: 30 | Fill #1

## 2019-04-21 MED FILL — ?ATORVASTATIN 40MG TABL: 40 | 30 days supply | Qty: 60 | Fill #1

## 2019-04-21 MED FILL — GABAPENTIN 300 MG CAPSULE: 300 | 30 days supply | Qty: 30 | Fill #1

## 2019-06-19 MED FILL — GABAPENTIN 300 MG CAPSULE: 300 | 30 days supply | Qty: 30 | Fill #2

## 2019-06-19 MED FILL — ?CLOPIDOGREL 75MG TA: 75 | 30 days supply | Qty: 30 | Fill #1

## 2019-06-19 MED FILL — LISINOPRIL 10 MG TABS: 10 | 30 days supply | Qty: 30 | Fill #2

## 2019-06-19 MED FILL — ?ATORVASTATIN 40MG TABLET: 40 | 30 days supply | Qty: 60 | Fill #2

## 2019-06-19 MED FILL — AMLODIPINE BESYLATE 10 MG T: 10 | 30 days supply | Qty: 15 | Fill #1

## 2019-06-19 MED FILL — LANTUS 100 UNITS/ML VIAL: 100 | 26 days supply | Qty: 20 | Fill #0

## 2019-06-19 MED FILL — ISOSORBIDE MN ER 60 MG TAB: 60 | 30 days supply | Qty: 30 | Fill #2

## 2019-06-19 MED FILL — TRUE METRIX TEST STRIP: 33 days supply | Qty: 100 | Fill #1

## 2019-07-28 ENCOUNTER — Other Ambulatory Visit: Payer: Self-pay | Admitting: Family Medicine

## 2019-07-28 DIAGNOSIS — E1142 Type 2 diabetes mellitus with diabetic polyneuropathy: Secondary | ICD-10-CM

## 2019-07-28 DIAGNOSIS — I10 Essential (primary) hypertension: Secondary | ICD-10-CM

## 2019-07-28 MED FILL — AMLODIPINE BESYLATE 10 MG T: 10 | 30 days supply | Qty: 15 | Fill #2

## 2019-07-28 MED FILL — CLOPIDOGREL 75 MG TABLET: 75 | 30 days supply | Qty: 30 | Fill #2

## 2019-07-28 MED FILL — GABAPENTIN 300 MG CAPSULE: 300 | 30 days supply | Qty: 30 | Fill #3

## 2019-07-28 MED FILL — TRUE METRIX TEST STRIP: 33 days supply | Qty: 100 | Fill #2

## 2019-08-19 ENCOUNTER — Other Ambulatory Visit: Payer: Self-pay | Admitting: Family Medicine

## 2019-08-19 DIAGNOSIS — E1142 Type 2 diabetes mellitus with diabetic polyneuropathy: Secondary | ICD-10-CM

## 2019-08-19 DIAGNOSIS — I10 Essential (primary) hypertension: Secondary | ICD-10-CM

## 2019-09-24 ENCOUNTER — Ambulatory Visit: Payer: Self-pay | Attending: Family Medicine | Admitting: Family Medicine

## 2019-09-24 ENCOUNTER — Other Ambulatory Visit: Payer: Self-pay

## 2019-09-24 ENCOUNTER — Encounter: Payer: Self-pay | Admitting: Family Medicine

## 2019-09-24 ENCOUNTER — Other Ambulatory Visit: Payer: Self-pay | Admitting: Family Medicine

## 2019-09-24 VITALS — BP 176/73 | HR 64 | Ht 65.0 in | Wt 227.4 lb

## 2019-09-24 DIAGNOSIS — I10 Essential (primary) hypertension: Secondary | ICD-10-CM

## 2019-09-24 DIAGNOSIS — M5442 Lumbago with sciatica, left side: Secondary | ICD-10-CM

## 2019-09-24 DIAGNOSIS — Z794 Long term (current) use of insulin: Secondary | ICD-10-CM

## 2019-09-24 DIAGNOSIS — M5441 Lumbago with sciatica, right side: Secondary | ICD-10-CM

## 2019-09-24 DIAGNOSIS — R42 Dizziness and giddiness: Secondary | ICD-10-CM

## 2019-09-24 DIAGNOSIS — Z1159 Encounter for screening for other viral diseases: Secondary | ICD-10-CM

## 2019-09-24 DIAGNOSIS — I6340 Cerebral infarction due to embolism of unspecified cerebral artery: Secondary | ICD-10-CM

## 2019-09-24 DIAGNOSIS — E1142 Type 2 diabetes mellitus with diabetic polyneuropathy: Secondary | ICD-10-CM

## 2019-09-24 DIAGNOSIS — G8929 Other chronic pain: Secondary | ICD-10-CM

## 2019-09-24 DIAGNOSIS — R600 Localized edema: Secondary | ICD-10-CM

## 2019-09-24 LAB — GLUCOSE, POCT (MANUAL RESULT ENTRY): POC Glucose: 189 mg/dl — AB (ref 70–99)

## 2019-09-24 LAB — POCT GLYCOSYLATED HEMOGLOBIN (HGB A1C): HbA1c, POC (controlled diabetic range): 6.7 % (ref 0.0–7.0)

## 2019-09-24 MED ORDER — AMLODIPINE BESYLATE 5 MG PO TABS: 5.0000 mg | ORAL_TABLET | Freq: Every day | ORAL | 1 refills | Status: DC

## 2019-09-24 MED ORDER — ATORVASTATIN CALCIUM 80 MG PO TABS: 80.0000 mg | ORAL_TABLET | Freq: Every day | ORAL | 1 refills | Status: DC

## 2019-09-24 MED ORDER — LISINOPRIL 20 MG PO TABS: 20.0000 mg | ORAL_TABLET | Freq: Every day | ORAL | 1 refills | Status: DC

## 2019-09-24 MED ORDER — CLOPIDOGREL BISULFATE 75 MG PO TABS: 75.0000 mg | ORAL_TABLET | Freq: Every day | ORAL | 1 refills | Status: DC

## 2019-09-24 MED ORDER — INSULIN GLARGINE 100 UNIT/ML ~~LOC~~ SOLN: 32.0000 [IU] | Freq: Two times a day (BID) | SUBCUTANEOUS | 6 refills | Status: DC

## 2019-09-24 MED ORDER — NAPROXEN 500 MG PO TABS: 500.0000 mg | ORAL_TABLET | Freq: Two times a day (BID) | ORAL | 0 refills | Status: DC

## 2019-09-24 MED ORDER — ISOSORBIDE MONONITRATE ER 60 MG PO TB24: 60.0000 mg | ORAL_TABLET | Freq: Every day | ORAL | 1 refills | Status: DC

## 2019-09-24 MED ORDER — GABAPENTIN 300 MG PO CAPS: 300.0000 mg | ORAL_CAPSULE | Freq: Every day | ORAL | 1 refills | Status: DC

## 2019-09-24 MED FILL — NAPROXEN 500 MG TABLET: 500 | 15 days supply | Qty: 30 | Fill #0

## 2019-09-24 MED FILL — LANTUS 100 UNITS/ML VIAL: 100 | 31 days supply | Qty: 20 | Fill #0

## 2019-09-24 MED FILL — GABAPENTIN 300 MG CAPSULE: 300 | 30 days supply | Qty: 30 | Fill #0

## 2019-09-24 MED FILL — ATORVASTATIN 80 MG TABLET: 80 | 30 days supply | Qty: 30 | Fill #0

## 2019-09-24 MED FILL — CLOPIDOGREL 75 MG TABLET: 75 | 30 days supply | Qty: 30 | Fill #0

## 2019-09-24 MED FILL — ISOSORBIDE MN ER 60 MG TAB: 60 | 30 days supply | Qty: 30 | Fill #0

## 2019-09-24 MED FILL — AMLODIPINE BESYLATE 5 MG TA: 5 | 30 days supply | Qty: 30 | Fill #0

## 2019-09-24 MED FILL — LISINOPRIL 20 MG TABLET: 20 | 30 days supply | Qty: 30 | Fill #0

## 2019-09-24 NOTE — Progress Notes (Signed)
Subjective:  Patient ID: Meghan Ford, female    DOB: 20-Dec-1940  Age: 79 y.o. MRN: [id number]  CC: Diabetes   HPI Meghan Ford  is a 79 year old female with a history of type 2 diabetes mellitus (A1c 6.7), diabetic neuropathy, hypertension, Hyperlipidemia, previous noncompliance, right PCA stroke (s/p loop recorder)  here for follow-up visit. For the last 2 weeks she has felt light headed on bending and would need to hold on to support.Feels she is loosing her balance while walking. Denies presence of dizziness on sitting or when she turns her head.  Denies hearing loss. Fasting glucose was 137 this morning and sometimes has them in the 80s and she drinks some juice to raise her sugars ; currently on 35 units of Lantus bid.  She complains of lower back pain which radiates down to her lower extremities bilaterally and also complains of pedal edema. She has been compliant with her diabetic regimen, is doing well on her antihypertensive and her statin. Past Medical History:  Diagnosis Date  . Arthritis    bil knees  . Blurred vision, left eye   . Blurry vision, bilateral   . Diabetes mellitus type II, uncontrolled ([place])   . Diabetes mellitus without complication ([place])   . Dysrhythmia    patient via intepreter reports at time of last stroke , her heartbeat was very irregular ; HR WDL today, patient denies any headache, chest pain, new numbness or new worsening vision since last stroke   . Fx humeral neck   . Gait disturbance   . History of kidney stones   . Hyperlipidemia LDL goal < 100   . Hypertension   . Left arm weakness    ambulates with cane   . Leg pain    worse with prolonged standing  . Peripheral vascular disease ([place])   . Pyelonephritis 06/14/2017  . Sepsis ([place]) 06/14/2017  . Status post placement of implantable loop recorder   . Stroke [facility]) 2018; 05/08/2017   denies residual from 2018 stroke on 05/08/2017; weakness on left; speech issues from  today's stroke (05/08/2017)  . Varicose veins     Past Surgical History:  Procedure Laterality Date  . COLONOSCOPY WITH PROPOFOL Left 03/29/2019   Procedure: COLONOSCOPY WITH PROPOFOL;  Surgeon: Ronnette Juniper, MD;  Location: [facility];  Service: Gastroenterology;  Laterality: Left;  . FRACTURE SURGERY Left 2017   Shoulder  . HOLMIUM LASER APPLICATION Left 0/03/6008   Procedure: HOLMIUM LASER APPLICATION;  Surgeon: Cleon Gustin, MD;  Location: [facility];  Service: Urology;  Laterality: Left;  . LOOP RECORDER INSERTION N/A 05/09/2017   Procedure: LOOP RECORDER INSERTION;  Surgeon: Constance Haw, MD;  Location: [place] CV [facility];  Service: Cardiovascular;  Laterality: N/A;  . NEPHROLITHOTOMY Left 09/18/2017   Procedure: CYSTOSCOPY/LEFT URETEROSCOPIC STONE EXTRACTION/LEFT RETROGRADE/ STENT PLACEMENT;  Surgeon: Cleon Gustin, MD;  Location: [facility];  Service: Urology;  Laterality: Left;  . POLYPECTOMY  03/29/2019   Procedure: POLYPECTOMY;  Surgeon: Ronnette Juniper, MD;  Location: [facility];  Service: Gastroenterology;;  . REVERSE SHOULDER ARTHROPLASTY Left 10/27/2014   Procedure: REVERSE SHOULDER ARTHROPLASTY;  Surgeon: Renette Butters, MD;  Location: [place];  Service: Orthopedics;  Laterality: Left;  . TEE WITHOUT CARDIOVERSION N/A 05/09/2017   Procedure: TRANSESOPHAGEAL ECHOCARDIOGRAM (TEE);  Surgeon: Jerline Pain, MD;  Location: [facility] ENDOSCOPY;  Service: Cardiovascular;  Laterality: N/A;  . TOTAL SHOULDER ARTHROPLASTY Left 10/27/2014   Procedure: TOTAL SHOULDER ARTHROPLASTY;  Surgeon: Renette Butters, MD;  Location: [place];  Service: Orthopedics;  Laterality: Left;  Marland Kitchen VEIN SURGERY Right 1998?   right leg    Family History  Problem Relation Age of Onset  . Cancer Brother   . Heart disease Sister     No Known Allergies  Outpatient Medications Prior to Visit  Medication Sig Dispense Refill  . aspirin 81 MG tablet Take 4 tablets (325 mg total) by mouth daily. Start 03/31/2019 30  tablet 11  . blood glucose meter kit and supplies KIT Dispense based on patient and insurance preference. Use up to four times daily as directed. (FOR ICD-9 250.00, 250.01). 1 each 0  . Blood Glucose Monitoring Suppl (TRUE METRIX METER) DEVI 1 each by Does not apply route 3 (three) times daily before meals. 1 Device 0  . glucose blood (TRUE METRIX BLOOD GLUCOSE TEST) test strip Use 3 times daily before meals 100 each 12  . glucose blood test strip Use as instructed 100 each 12  . Insulin Syringe-Needle U-100 (BD INSULIN SYRINGE ULTRAFINE) 31G X 15/64" 0.5 ML MISC 1 each by Does not apply route daily. 60 each 6  . Insulin Syringes, Disposable, U-100 0.3 ML MISC USE AS DIRECTED 100 each 11  . senna-docusate (SENOKOT-S) 8.6-50 MG tablet Take 1 tablet by mouth at bedtime as needed for mild constipation. 10 tablet 0  . TRUEPLUS LANCETS 28G MISC 1 each by Does not apply route 3 (three) times daily before meals. 100 each 12  . amLODipine (NORVASC) 10 MG tablet Take 0.5 tablets (5 mg total) by mouth daily. 30 tablet 2  . atorvastatin (LIPITOR) 40 MG tablet TAKE 2 TABLETS BY MOUTH DAILY (Patient taking differently: Take 80 mg by mouth daily. ) 60 tablet 2  . clopidogrel (PLAVIX) 75 MG tablet Take 1 tablet (75 mg total) by mouth daily. Start taking Plavix on 03/31/2019. 30 tablet 2  . gabapentin (NEURONTIN) 300 MG capsule TAKE 1 CAPSULE (300 MG TOTAL) BY MOUTH AT BEDTIME. 30 capsule 5  . insulin glargine (LANTUS) 100 UNIT/ML injection Inject 0.38 mLs (38 Units total) into the skin 2 (two) times daily. 20 mL 0  . isosorbide mononitrate (IMDUR) 60 MG 24 hr tablet TAKE 1 TABLET (60 MG TOTAL) BY MOUTH DAILY. 30 tablet 2  . lisinopril (ZESTRIL) 10 MG tablet TAKE 1 TABLET (10 MG TOTAL) BY MOUTH DAILY. 30 tablet 2   No facility-administered medications prior to visit.     ROS Review of Systems  Constitutional: Negative for activity change, appetite change and fatigue.  HENT: Negative for congestion, sinus  pressure and sore throat.   Eyes: Negative for visual disturbance.  Respiratory: Negative for cough, chest tightness, shortness of breath and wheezing.   Cardiovascular: Negative for chest pain and palpitations.  Gastrointestinal: Negative for abdominal distention, abdominal pain and constipation.  Endocrine: Negative for polydipsia.  Genitourinary: Negative for dysuria and frequency.  Musculoskeletal: Negative for arthralgias and back pain.  Skin: Negative for rash.  Neurological: Positive for dizziness. Negative for tremors, light-headedness and numbness.  Hematological: Does not bruise/bleed easily.  Psychiatric/Behavioral: Negative for agitation and behavioral problems.    Objective:  BP (!) 176/73 (BP Location: Left Arm, Cuff Size: Normal)   Pulse 64   Ht '5\' 5"'$  (1.651 m)   Wt 227 lb 6.4 oz (103.1 kg)   SpO2 96%   BMI 37.84 kg/m   BP/Weight 09/24/2019 03/29/2019 78/46/9629  Systolic BP 528 413 244  Diastolic BP 73 57 82  Wt. (Lbs) 227.4 220.9 220.4  BMI 37.84 36.76 37.83      Physical Exam Constitutional:      Appearance: She is well-developed.  Neck:     Vascular: No JVD.  Cardiovascular:     Rate and Rhythm: Normal rate.     Heart sounds: Normal heart sounds. No murmur heard.   Pulmonary:     Effort: Pulmonary effort is normal.     Breath sounds: Normal breath sounds. No wheezing or rales.  Chest:     Chest wall: No tenderness.  Abdominal:     General: Bowel sounds are normal. There is no distension.     Palpations: Abdomen is soft. There is no mass.     Tenderness: There is no abdominal tenderness.  Musculoskeletal:        General: Normal range of motion.     Right lower leg: No edema.     Left lower leg: No edema.  Neurological:     Mental Status: She is alert and oriented to person, place, and time.  Psychiatric:        Mood and Affect: Mood normal.     CMP Latest Ref Rng & Units 09/24/2019 03/29/2019 03/28/2019  Glucose 65 - 99 mg/dL 141(H) 104(H) 78   BUN 8 - 27 mg/dL 19 25(H) 32(H)  Creatinine 0.57 - 1.00 mg/dL 1.19(H) 0.95 1.17(H)  Sodium 134 - 144 mmol/L 140 142 144  Potassium 3.5 - 5.2 mmol/L 4.3 3.7 4.0  Chloride 96 - 106 mmol/L 104 112(H) 113(H)  CO2 20 - 29 mmol/L '24 22 24  '$ Calcium 8.7 - 10.3 mg/dL 8.4(L) 8.1(L) 8.1(L)  Total Protein 6.0 - 8.5 g/dL 5.8(L) 5.2(L) 4.8(L)  Total Bilirubin 0.0 - 1.2 mg/dL <0.2 0.6 0.5  Alkaline Phos 48 - 121 IU/L 106 52 48  AST 0 - 40 IU/L '16 25 18  '$ ALT 0 - 32 IU/L '12 14 13    '$ Lipid Panel     Component Value Date/Time   CHOL 243 (H) 12/30/2018 1119   TRIG 153 (H) 12/30/2018 1119   HDL 58 12/30/2018 1119   CHOLHDL 4.2 12/30/2018 1119   CHOLHDL 4.4 05/08/2017 0503   VLDL 36 05/08/2017 0503   LDLCALC 158 (H) 12/30/2018 1119    CBC    Component Value Date/Time   WBC 7.8 03/29/2019 0621   RBC 3.47 (L) 03/29/2019 0621   HGB 8.9 (L) 03/29/2019 0621   HGB 13.9 12/30/2018 1119   HCT 29.4 (L) 03/29/2019 0621   HCT 23.7 (L) 03/28/2019 0539   PLT 212 03/29/2019 0621   PLT 297 12/30/2018 1119   MCV 84.7 03/29/2019 0621   MCV 82 12/30/2018 1119   MCH 25.6 (L) 03/29/2019 0621   MCHC 30.3 03/29/2019 0621   RDW 15.3 03/29/2019 0621   RDW 14.2 12/30/2018 1119   LYMPHSABS 2.7 03/27/2019 1009   MONOABS 0.5 03/27/2019 1009   EOSABS 0.1 03/27/2019 1009   BASOSABS 0.0 03/27/2019 1009    Lab Results  Component Value Date   HGBA1C 6.7 09/24/2019    Assessment & Plan:  1. Type 2 diabetes mellitus with diabetic polyneuropathy, with long-term current use of insulin (HCC) Controlled with A1c of 6.7 We will decrease Lantus dose to 32 units twice daily due to recent complaints of dizziness Counseled on Diabetic diet, my plate method, 812 minutes of moderate intensity exercise/week Blood sugar logs with fasting goals of 80-120 mg/dl, random of less than 180 and in the event of sugars less than 60 mg/dl or greater than  400 mg/dl encouraged to notify the clinic. Advised on the need for annual eye  exams, annual foot exams, Pneumonia vaccine. - POCT glucose (manual entry) - POCT glycosylated hemoglobin (Hb A1C) - insulin glargine (LANTUS) 100 UNIT/ML injection; Inject 0.32 mLs (32 Units total) into the skin 2 (two) times daily.  Dispense: 30 mL; Refill: 6 - gabapentin (NEURONTIN) 300 MG capsule; Take 1 capsule (300 mg total) by mouth at bedtime.  Dispense: 90 capsule; Refill: 1 - Microalbumin / creatinine urine ratio  2. Essential hypertension Uncontrolled We will discontinue amlodipine due to complaints of pedal edema Increase lisinopril dose We will check renal function - isosorbide mononitrate (IMDUR) 60 MG 24 hr tablet; Take 1 tablet (60 mg total) by mouth daily.  Dispense: 90 tablet; Refill: 1 - lisinopril (ZESTRIL) 20 MG tablet; Take 1 tablet (20 mg total) by mouth daily.  Dispense: 90 tablet; Refill: 1 - CMP14+EGFR  3. Cerebrovascular accident (CVA) due to embolism of cerebral artery (HCC) Risk factor modification Continue statin, Plavix - clopidogrel (PLAVIX) 75 MG tablet; Take 1 tablet (75 mg total) by mouth daily. Start taking Plavix on 03/31/2019.  Dispense: 90 tablet; Refill: 1  4. Pedal edema Discontinued amlodipine Elevate feet, use compression stocking  5. Chronic midline low back pain with bilateral sciatica Refilled gabapentin Placed on naproxen  6. Dizziness Orthostatic vitals checked and she is not orthostatic. Sitting: BP-176/73, P-64 Standing: BP- 189/89, P -71 Advised to change positions slowly If symptoms persist, consider addition of meclizine  7. Need for hepatitis C screening test - HCV RNA quant rflx ultra or genotyp([facility])   Meds ordered this encounter  Medications  . insulin glargine (LANTUS) 100 UNIT/ML injection    Sig: Inject 0.32 mLs (32 Units total) into the skin 2 (two) times daily.    Dispense:  30 mL    Refill:  6    Dose change  . DISCONTD: amLODipine (NORVASC) 5 MG tablet    Sig: Take 1 tablet (5 mg total) by mouth  daily.    Dispense:  90 tablet    Refill:  1  . atorvastatin (LIPITOR) 80 MG tablet    Sig: Take 1 tablet (80 mg total) by mouth daily.    Dispense:  90 tablet    Refill:  1  . clopidogrel (PLAVIX) 75 MG tablet    Sig: Take 1 tablet (75 mg total) by mouth daily. Start taking Plavix on 03/31/2019.    Dispense:  90 tablet    Refill:  1  . gabapentin (NEURONTIN) 300 MG capsule    Sig: Take 1 capsule (300 mg total) by mouth at bedtime.    Dispense:  90 capsule    Refill:  1  . isosorbide mononitrate (IMDUR) 60 MG 24 hr tablet    Sig: Take 1 tablet (60 mg total) by mouth daily.    Dispense:  90 tablet    Refill:  1  . lisinopril (ZESTRIL) 20 MG tablet    Sig: Take 1 tablet (20 mg total) by mouth daily.    Dispense:  90 tablet    Refill:  1    Discontinue amlodipine  . naproxen (NAPROSYN) 500 MG tablet    Sig: Take 1 tablet (500 mg total) by mouth 2 (two) times daily with a meal.    Dispense:  30 tablet    Refill:  0    Follow-up: Return in about 3 months (around 12/25/2019) for chronic disease management.       Manley Fason,  MD, FAAFP. [facility] [place], [place]   09/25/2019, 1:01 PM

## 2019-09-24 NOTE — Patient Instructions (Signed)
Mareos Dizziness Los mareos son un problema muy frecuente. Causan sensacin de inestabilidad o de desvanecimiento. Puede sentir que se va a desmayar. Los mareos pueden provocarle una lesin si se tropieza o se cae. La causa puede deberse a muchos problemas, tales como los siguientes:  Medicamentos.  No tener suficiente agua en el cuerpo (deshidratacin).  Enfermedad. Siga estas indicaciones en su casa: Comida y bebida   Beba suficiente lquido para mantener el pis (orina) claro o de color amarillo plido. Esto evita la deshidratacin. Trate de beber ms lquidos transparentes, como agua.  No beba alcohol.  Limite la cantidad de cafena que bebe o come si el mdico se lo indica.  Limite la cantidad de sal (sodio) que bebe o come si el mdico se lo indica. Actividad   Evite los movimientos rpidos. ? Cuando se levante de una silla, sujtese hasta sentirse bien. ? Por la maana, sintese primero a un lado de la cama. Cuando se sienta bien, pngase lentamente de pie mientras se sostiene de algo. Haga esto hasta que se sienta seguro en cuanto al equilibrio.  Mueva las piernas con frecuencia si debe estar de pie en un lugar durante mucho tiempo. Mientras est de pie, contraiga y relaje los msculos de las piernas.  No conduzca vehculos ni opere maquinaria pesada si se siente mareado.  Evite agacharse si se siente mareado. En su casa, coloque los objetos en algn lugar que le resulte fcil alcanzarlos sin agacharse. Estilo de vida  No consuma ningn producto que contenga nicotina o tabaco, como cigarrillos y cigarrillos electrnicos. Si necesita ayuda para dejar de fumar, consulte al mdico.  Intente bajar el nivel de estrs. Para hacerlo, puede usar mtodos como el yoga o la meditacin. Hable con el mdico si necesita ayuda. Instrucciones generales  Controle sus mareos para ver si hay cambios.  Tome los medicamentos de venta libre y los recetados solamente como se lo haya indicado  el mdico. Hable con el mdico si cree que la causa de sus mareos es algn medicamento que est tomando.  Infrmele a un amigo o a un familiar si se siente mareado. Pdale a esta persona que llame al mdico si observa cambios en su comportamiento.  Concurra a todas las visitas de control como se lo haya indicado el mdico. Esto es importante. Comunquese con un mdico si:  Los mareos persisten.  Los mareos o la sensacin de desvanecimiento empeoran.  Siente malestar estomacal (nuseas).  Tiene problemas para escuchar.  Aparecen nuevos sntomas.  Siente inestabilidad al estar de pie.  Siente que la habitacin da vueltas. Solicite ayuda de inmediato si:  Vomita o tiene heces acuosas (diarrea), y no puede comer o beber nada.  Tiene dificultad para hacer lo siguiente: ? Hablar. ? Caminar. ? Tragar. ? Usar los brazos, las manos o las piernas.  Se siente constantemente dbil.  No piensa con claridad o tiene dificultad para armar oraciones. Es posible que un amigo o un familiar adviertan que esto ocurre.  Tiene los siguientes sntomas: ? Dolor en el pecho. ? Dolor en el vientre (abdomen). ? Falta de aire. ? Sudoracin.  Cambios en la visin.  Sangrado.  Dolor de cabeza muy intenso.  Dolor o rigidez en el cuello.  Fiebre. Estos sntomas pueden indicar una emergencia. No espere hasta que los sntomas desaparezcan. Solicite atencin mdica de inmediato. Comunquese con el servicio de emergencias de su localidad (911 en los [place]). No conduzca por sus propios medios hasta el hospital. Resumen  Los   mareos causan sensacin de inestabilidad o de desvanecimiento. Puede sentir que se va a desmayar.  Beba suficiente lquido para mantener el pis (orina) claro o de color amarillo plido. No beba alcohol.  Evite los movimientos rpidos si se siente mareado.  Controle sus mareos para ver si hay cambios. Esta informacin no tiene como fin reemplazar el consejo del  mdico. Asegrese de hacerle al mdico cualquier pregunta que tenga. Document Revised: 08/31/2016 Document Reviewed: 08/31/2016 Elsevier Patient Education  2020 [employer]vier Inc.  

## 2019-09-24 NOTE — Progress Notes (Signed)
States that she is feeling very bad.  Would like to get lab work to evaluate.

## 2019-09-25 LAB — CMP14+EGFR
ALT: 12 IU/L (ref 0–32)
AST: 16 IU/L (ref 0–40)
Albumin/Globulin Ratio: 1.2 (ref 1.2–2.2)
Albumin: 3.2 g/dL — ABNORMAL LOW (ref 3.7–4.7)
Alkaline Phosphatase: 106 IU/L (ref 48–121)
BUN/Creatinine Ratio: 16 (ref 12–28)
BUN: 19 mg/dL (ref 8–27)
Bilirubin Total: <0.2 mg/dL (ref 0.0–1.2)
CO2: 24 mmol/L (ref 20–29)
Calcium: 8.4 mg/dL — ABNORMAL LOW (ref 8.7–10.3)
Chloride: 104 mmol/L (ref 96–106)
Creatinine, Ser: 1.19 mg/dL — ABNORMAL HIGH (ref 0.57–1.00)
GFR calc Af Amer: 51 mL/min/{1.73_m2} — ABNORMAL LOW (ref >59)
GFR calc non Af Amer: 44 mL/min/{1.73_m2} — ABNORMAL LOW (ref >59)
Globulin, Total: 2.6 g/dL (ref 1.5–4.5)
Glucose: 141 mg/dL — ABNORMAL HIGH (ref 65–99)
Potassium: 4.3 mmol/L (ref 3.5–5.2)
Sodium: 140 mmol/L (ref 134–144)
Total Protein: 5.8 g/dL — ABNORMAL LOW (ref 6.0–8.5)

## 2019-09-25 LAB — HCV RNA QUANT RFLX ULTRA OR GENOTYP: HCV Quant Baseline: NOT DETECTED IU/mL

## 2019-09-25 LAB — MICROALBUMIN / CREATININE URINE RATIO
Creatinine, Urine: 221.9 mg/dL
Microalb/Creat Ratio: 4799 mg/g creat — ABNORMAL HIGH (ref 0–29)
Microalbumin, Urine: 10650 ug/mL

## 2019-09-26 ENCOUNTER — Telehealth: Payer: Self-pay

## 2019-09-26 NOTE — Telephone Encounter (Signed)
-----   Message from Hoy Register, MD sent at 09/25/2019  5:32 PM EDT ----- Labs reveal kidneys spilling out protein which could be as a result of diabetes affecting her kidney. Continue with Diabetic regimen and Lisinopril which should help with this

## 2019-09-26 NOTE — Telephone Encounter (Signed)
Patient was called and informed of lab results via voicemail. 

## 2019-11-11 ENCOUNTER — Other Ambulatory Visit: Payer: Self-pay | Admitting: Family Medicine

## 2019-11-11 DIAGNOSIS — E1142 Type 2 diabetes mellitus with diabetic polyneuropathy: Secondary | ICD-10-CM

## 2019-11-11 MED ORDER — GABAPENTIN 300 MG PO CAPS: 300.0000 mg | ORAL_CAPSULE | Freq: Every day | ORAL | 1 refills | Status: DC

## 2019-11-11 MED FILL — GABAPENTIN 300 MG CAPSULE: 300 | 30 days supply | Qty: 30 | Fill #0

## 2019-11-11 NOTE — Telephone Encounter (Signed)
Medication Refill - Medication:  gabapentin (NEURONTIN) 300 MG capsule [[id number]]    Preferred Pharmacy (with phone number or street name):  [facility] - [place], [place] - [address]  [address] [place] [address]  Phone: [phone] Fax: [phone]  Hours: Not open 24 hours     Agent: Please be advised that RX refills may take up to 3 business days. We ask that you follow-up with your pharmacy.

## 2019-11-14 MED FILL — ISOSORBIDE MN ER 60 MG TAB: 60 | 30 days supply | Qty: 30 | Fill #1

## 2019-11-14 MED FILL — ?CLOPIDOGREL 75MG TA: 75 | 30 days supply | Qty: 30 | Fill #1

## 2019-11-14 MED FILL — AMLODIPINE BESYLATE 5 MG TA: 5 | 30 days supply | Qty: 30 | Fill #1

## 2019-11-14 MED FILL — ATORVASTATIN 80 MG TABLET: 80 | 30 days supply | Qty: 30 | Fill #1

## 2019-11-14 MED FILL — LISINOPRIL 20 MG TABLET: 20 | 30 days supply | Qty: 30 | Fill #1

## 2019-12-31 MED FILL — ?CLOPIDOGREL 75MG TA: 75 | 30 days supply | Qty: 30 | Fill #1

## 2019-12-31 MED FILL — AMLODIPINE BESYLATE 10 MG T: 10 | 30 days supply | Qty: 30 | Fill #1

## 2020-01-01 ENCOUNTER — Other Ambulatory Visit: Payer: Self-pay | Admitting: Family Medicine

## 2020-01-01 MED FILL — ISOSORBIDE MN ER 60 MG TAB: 60 | 30 days supply | Qty: 30 | Fill #1

## 2020-01-01 MED FILL — ATORVASTATIN CALCIUM 80 MG: 80 | 30 days supply | Qty: 30 | Fill #1

## 2020-01-01 MED FILL — TRUEPLUS SYR 0.5ML 31GX5/16: 31G X 5/16" | 25 days supply | Qty: 100 | Fill #0

## 2020-01-01 MED FILL — GABAPENTIN 300 MG CAPSULE: 300 | 30 days supply | Qty: 30 | Fill #1

## 2020-01-01 MED FILL — LISINOPRIL 20 MG TABLET: 20 | 30 days supply | Qty: 30 | Fill #1

## 2020-01-01 MED FILL — !LANTUS 100 UNITS/ML VIAL: 100 | 31 days supply | Qty: 20 | Fill #1

## 2020-01-06 ENCOUNTER — Ambulatory Visit (INDEPENDENT_AMBULATORY_CARE_PROVIDER_SITE_OTHER): Payer: Self-pay | Admitting: Primary Care

## 2020-01-13 ENCOUNTER — Ambulatory Visit (INDEPENDENT_AMBULATORY_CARE_PROVIDER_SITE_OTHER): Payer: Self-pay | Admitting: Primary Care

## 2020-02-16 ENCOUNTER — Encounter (HOSPITAL_COMMUNITY): Payer: Self-pay

## 2020-02-16 ENCOUNTER — Other Ambulatory Visit: Payer: Self-pay

## 2020-02-16 ENCOUNTER — Inpatient Hospital Stay (HOSPITAL_COMMUNITY)
Admission: EM | Admit: 2020-02-16 | Discharge: 2020-02-18 | DRG: 637 | Disposition: A | Discharge status: Home / Self Care | Payer: Self-pay | Attending: Internal Medicine | Admitting: Internal Medicine

## 2020-02-16 ENCOUNTER — Emergency Department (HOSPITAL_COMMUNITY): Payer: Self-pay

## 2020-02-16 DIAGNOSIS — G9341 Metabolic encephalopathy: Secondary | ICD-10-CM | POA: Diagnosis present

## 2020-02-16 DIAGNOSIS — Z96619 Presence of unspecified artificial shoulder joint: Secondary | ICD-10-CM | POA: Diagnosis present

## 2020-02-16 DIAGNOSIS — Z7982 Long term (current) use of aspirin: Secondary | ICD-10-CM

## 2020-02-16 DIAGNOSIS — Z20822 Contact with and (suspected) exposure to covid-19: Secondary | ICD-10-CM | POA: Diagnosis present

## 2020-02-16 DIAGNOSIS — Z7902 Long term (current) use of antithrombotics/antiplatelets: Secondary | ICD-10-CM

## 2020-02-16 DIAGNOSIS — Z6837 Body mass index (BMI) 37.0-37.9, adult: Secondary | ICD-10-CM

## 2020-02-16 DIAGNOSIS — G9389 Other specified disorders of brain: Secondary | ICD-10-CM | POA: Diagnosis present

## 2020-02-16 DIAGNOSIS — E876 Hypokalemia: Secondary | ICD-10-CM | POA: Diagnosis present

## 2020-02-16 DIAGNOSIS — E1151 Type 2 diabetes mellitus with diabetic peripheral angiopathy without gangrene: Secondary | ICD-10-CM | POA: Diagnosis present

## 2020-02-16 DIAGNOSIS — K639 Disease of intestine, unspecified: Secondary | ICD-10-CM | POA: Diagnosis present

## 2020-02-16 DIAGNOSIS — E669 Obesity, unspecified: Secondary | ICD-10-CM | POA: Diagnosis present

## 2020-02-16 DIAGNOSIS — E11641 Type 2 diabetes mellitus with hypoglycemia with coma: Principal | ICD-10-CM | POA: Diagnosis present

## 2020-02-16 DIAGNOSIS — N1831 Chronic kidney disease, stage 3a: Secondary | ICD-10-CM | POA: Diagnosis present

## 2020-02-16 DIAGNOSIS — I10 Essential (primary) hypertension: Secondary | ICD-10-CM | POA: Diagnosis present

## 2020-02-16 DIAGNOSIS — Z794 Long term (current) use of insulin: Secondary | ICD-10-CM

## 2020-02-16 DIAGNOSIS — M170 Bilateral primary osteoarthritis of knee: Secondary | ICD-10-CM | POA: Diagnosis present

## 2020-02-16 DIAGNOSIS — G9340 Encephalopathy, unspecified: Secondary | ICD-10-CM | POA: Diagnosis present

## 2020-02-16 DIAGNOSIS — E162 Hypoglycemia, unspecified: Secondary | ICD-10-CM

## 2020-02-16 DIAGNOSIS — N179 Acute kidney failure, unspecified: Secondary | ICD-10-CM | POA: Diagnosis present

## 2020-02-16 DIAGNOSIS — E1122 Type 2 diabetes mellitus with diabetic chronic kidney disease: Secondary | ICD-10-CM | POA: Diagnosis present

## 2020-02-16 DIAGNOSIS — I129 Hypertensive chronic kidney disease with stage 1 through stage 4 chronic kidney disease, or unspecified chronic kidney disease: Secondary | ICD-10-CM | POA: Diagnosis present

## 2020-02-16 DIAGNOSIS — E785 Hyperlipidemia, unspecified: Secondary | ICD-10-CM | POA: Diagnosis present

## 2020-02-16 DIAGNOSIS — IMO0002 Reserved for concepts with insufficient information to code with codable children: Secondary | ICD-10-CM | POA: Diagnosis present

## 2020-02-16 DIAGNOSIS — Z8249 Family history of ischemic heart disease and other diseases of the circulatory system: Secondary | ICD-10-CM

## 2020-02-16 DIAGNOSIS — Z8673 Personal history of transient ischemic attack (TIA), and cerebral infarction without residual deficits: Secondary | ICD-10-CM

## 2020-02-16 DIAGNOSIS — E1165 Type 2 diabetes mellitus with hyperglycemia: Secondary | ICD-10-CM | POA: Diagnosis present

## 2020-02-16 DIAGNOSIS — E1140 Type 2 diabetes mellitus with diabetic neuropathy, unspecified: Secondary | ICD-10-CM | POA: Diagnosis present

## 2020-02-16 DIAGNOSIS — D631 Anemia in chronic kidney disease: Secondary | ICD-10-CM | POA: Diagnosis present

## 2020-02-16 DIAGNOSIS — Z79899 Other long term (current) drug therapy: Secondary | ICD-10-CM

## 2020-02-16 LAB — I-STAT VENOUS BLOOD GAS, ED
Acid-base deficit: 1 mmol/L (ref 0.0–2.0)
Bicarbonate: 24.4 mmol/L (ref 20.0–28.0)
Calcium, Ion: 1.08 mmol/L — ABNORMAL LOW (ref 1.15–1.40)
HCT: 34 % — ABNORMAL LOW (ref 36.0–46.0)
Hemoglobin: 11.6 g/dL — ABNORMAL LOW (ref 12.0–15.0)
O2 Saturation: 100 %
Potassium: 3.1 mmol/L — ABNORMAL LOW (ref 3.5–5.1)
Sodium: 139 mmol/L (ref 135–145)
TCO2: 26 mmol/L (ref 22–32)
pCO2, Ven: 42.7 mmHg — ABNORMAL LOW (ref 44.0–60.0)
pH, Ven: 7.364 (ref 7.250–7.430)
pO2, Ven: 184 mmHg — ABNORMAL HIGH (ref 32.0–45.0)

## 2020-02-16 LAB — COMPREHENSIVE METABOLIC PANEL
ALT: 21 U/L (ref 0–44)
AST: 31 U/L (ref 15–41)
Albumin: 2.1 g/dL — ABNORMAL LOW (ref 3.5–5.0)
Alkaline Phosphatase: 74 U/L (ref 38–126)
Anion gap: 11 (ref 5–15)
BUN: 28 mg/dL — ABNORMAL HIGH (ref 8–23)
CO2: 22 mmol/L (ref 22–32)
Calcium: 8 mg/dL — ABNORMAL LOW (ref 8.9–10.3)
Chloride: 106 mmol/L (ref 98–111)
Creatinine, Ser: 1.85 mg/dL — ABNORMAL HIGH (ref 0.44–1.00)
GFR, Estimated: 28 mL/min — ABNORMAL LOW (ref >60)
Glucose, Bld: 270 mg/dL — ABNORMAL HIGH (ref 70–99)
Potassium: 3 mmol/L — ABNORMAL LOW (ref 3.5–5.1)
Sodium: 139 mmol/L (ref 135–145)
Total Bilirubin: 0.6 mg/dL (ref 0.3–1.2)
Total Protein: 5.2 g/dL — ABNORMAL LOW (ref 6.5–8.1)

## 2020-02-16 LAB — I-STAT CHEM 8, ED
BUN: 34 mg/dL — ABNORMAL HIGH (ref 8–23)
Calcium, Ion: 1.07 mmol/L — ABNORMAL LOW (ref 1.15–1.40)
Chloride: 106 mmol/L (ref 98–111)
Creatinine, Ser: 1.7 mg/dL — ABNORMAL HIGH (ref 0.44–1.00)
Glucose, Bld: 275 mg/dL — ABNORMAL HIGH (ref 70–99)
HCT: 36 % (ref 36.0–46.0)
Hemoglobin: 12.2 g/dL (ref 12.0–15.0)
Potassium: 3 mmol/L — ABNORMAL LOW (ref 3.5–5.1)
Sodium: 139 mmol/L (ref 135–145)
TCO2: 23 mmol/L (ref 22–32)

## 2020-02-16 LAB — CBC WITH DIFFERENTIAL/PLATELET
Abs Immature Granulocytes: 0.01 10*3/uL (ref 0.00–0.07)
Basophils Absolute: 0 10*3/uL (ref 0.0–0.1)
Basophils Relative: 0 %
Eosinophils Absolute: 0.1 10*3/uL (ref 0.0–0.5)
Eosinophils Relative: 1 %
HCT: 36.4 % (ref 36.0–46.0)
Hemoglobin: 11.3 g/dL — ABNORMAL LOW (ref 12.0–15.0)
Immature Granulocytes: 0 %
Lymphocytes Relative: 37 %
Lymphs Abs: 3.5 10*3/uL (ref 0.7–4.0)
MCH: 25.6 pg — ABNORMAL LOW (ref 26.0–34.0)
MCHC: 31 g/dL (ref 30.0–36.0)
MCV: 82.4 fL (ref 80.0–100.0)
Monocytes Absolute: 0.7 10*3/uL (ref 0.1–1.0)
Monocytes Relative: 7 %
Neutro Abs: 5 10*3/uL (ref 1.7–7.7)
Neutrophils Relative %: 55 %
Platelets: 239 10*3/uL (ref 150–400)
RBC: 4.42 MIL/uL (ref 3.87–5.11)
RDW: 15.4 % (ref 11.5–15.5)
WBC: 9.3 10*3/uL (ref 4.0–10.5)
nRBC: 0 % (ref 0.0–0.2)

## 2020-02-16 LAB — CBG MONITORING, ED
Glucose-Capillary: 43 mg/dL — CL (ref 70–99)
Glucose-Capillary: 169 mg/dL — ABNORMAL HIGH (ref 70–99)
Glucose-Capillary: 83 mg/dL (ref 70–99)
Glucose-Capillary: 132 mg/dL — ABNORMAL HIGH (ref 70–99)

## 2020-02-16 LAB — URINALYSIS, COMPLETE (UACMP) WITH MICROSCOPIC
Bilirubin Urine: NEGATIVE
Glucose, UA: 150 mg/dL — AB
Hgb urine dipstick: NEGATIVE
Ketones, ur: NEGATIVE mg/dL
Leukocytes,Ua: NEGATIVE
Nitrite: NEGATIVE
Protein, ur: >=300 mg/dL — AB
Specific Gravity, Urine: 1.031 — ABNORMAL HIGH (ref 1.005–1.030)
pH: 5 (ref 5.0–8.0)

## 2020-02-16 LAB — RAPID URINE DRUG SCREEN, HOSP PERFORMED
Amphetamines: NOT DETECTED
Barbiturates: NOT DETECTED
Benzodiazepines: NOT DETECTED
Cocaine: NOT DETECTED
Opiates: NOT DETECTED
Tetrahydrocannabinol: NOT DETECTED

## 2020-02-16 LAB — BRAIN NATRIURETIC PEPTIDE: B Natriuretic Peptide: 47.9 pg/mL (ref 0.0–100.0)

## 2020-02-16 LAB — LACTIC ACID, PLASMA: Lactic Acid, Venous: 1.8 mmol/L (ref 0.5–1.9)

## 2020-02-16 LAB — TROPONIN I (HIGH SENSITIVITY): Troponin I (High Sensitivity): 11 ng/L (ref <18)

## 2020-02-16 LAB — ETHANOL: Alcohol, Ethyl (B): <10 mg/dL (ref <10)

## 2020-02-16 MED ORDER — DEXTROSE 50 % IV SOLN
INTRAVENOUS | Status: AC
  Filled 2020-02-16: qty 50

## 2020-02-16 MED ORDER — THIAMINE HCL 100 MG/ML IJ SOLN
100.0000 mg | Freq: Once | INTRAMUSCULAR | Status: AC
  Administered 2020-02-16: 100 mg via INTRAVENOUS
  Filled 2020-02-16: qty 2

## 2020-02-16 MED ORDER — DEXTROSE 50 % IV SOLN
INTRAVENOUS | Status: AC | PRN
  Administered 2020-02-16: 50 mL via INTRAVENOUS

## 2020-02-16 MED ORDER — DEXTROSE 50 % IV SOLN
25.0000 mL | Freq: Once | INTRAVENOUS | Status: AC
  Administered 2020-02-16: 25 mL via INTRAVENOUS
  Filled 2020-02-16: qty 50

## 2020-02-16 MED ORDER — POTASSIUM CHLORIDE 10 MEQ/100ML IV SOLN
10.0000 meq | INTRAVENOUS | Status: AC
  Administered 2020-02-16 – 2020-02-17 (×4): 10 meq via INTRAVENOUS
  Filled 2020-02-16 (×4): qty 100

## 2020-02-16 MED ORDER — DEXTROSE 10 % IV SOLN: INTRAVENOUS | Status: DC

## 2020-02-16 MED ORDER — SODIUM CHLORIDE 0.9 % IV SOLN
1.0000 g | Freq: Once | INTRAVENOUS | Status: AC
  Administered 2020-02-17: 1 g via INTRAVENOUS
  Filled 2020-02-16: qty 10

## 2020-02-16 MED ORDER — LACTATED RINGERS IV BOLUS
1000.0000 mL | Freq: Once | INTRAVENOUS | Status: AC
  Administered 2020-02-16: 1000 mL via INTRAVENOUS

## 2020-02-16 NOTE — ED Triage Notes (Addendum)
Pt arrive POV as unresponsive. Pt's son reports pt called him earlier today and told him her sugar was low and she drank a coke. Pt is diabetic. Pt diaphoretic upon arrival to ED. Pt transported to Rm 26 by staff. Pt was minimally responsive and was being bagged. 2 IVs placed. CBG was 43. Amp of D50 given. CBG came up to 169. Pt became more responsive. Pt still lethargic and requires painful stimuli to arouse. Pt placed on 2L [place] by RT. Pt told PA that she has had a headache and CP since yesterday w/ some N/V. Unable to get further information.

## 2020-02-16 NOTE — ED Notes (Signed)
CBG 43. 

## 2020-02-16 NOTE — ED Provider Notes (Incomplete)
[facility] EMERGENCY DEPARTMENT Provider Note   CSN: [id number] Arrival date & time: 02/16/20  2017     History Chief Complaint  Patient presents with  . Unresponsive    Meghan Ford is a 79 y.o. female with a past medical history of DM 2, stroke, who presents today for evaluation of unresponsive.  Level 5 caveat for altered mental status  Patient presents POV unresponsive.  Patient's family member reports that she called him earlier today and told him that her sugar was low when she drank a Coke.  Patient is nonverbal on arrival.    Patient's family member reports "she does not tell me when she falls anymore."  No known fall or trauma.  HPI     Past Medical History:  Diagnosis Date  . Arthritis    bil knees  . Blurred vision, left eye   . Blurry vision, bilateral   . Diabetes mellitus type II, uncontrolled ([place])   . Diabetes mellitus without complication ([place])   . Dysrhythmia    patient via intepreter reports at time of last stroke , her heartbeat was very irregular ; HR WDL today, patient denies any headache, chest pain, new numbness or new worsening vision since last stroke   . Fx humeral neck   . Gait disturbance   . History of kidney stones   . Hyperlipidemia LDL goal < 100   . Hypertension   . Left arm weakness    ambulates with cane   . Leg pain    worse with prolonged standing  . Peripheral vascular disease ([place])   . Pyelonephritis 06/14/2017  . Sepsis ([place]) 06/14/2017  . Status post placement of implantable loop recorder   . Stroke [facility]) 2018; 05/08/2017   denies residual from 2018 stroke on 05/08/2017; weakness on left; speech issues from today's stroke (05/08/2017)  . Varicose veins     Patient Active Problem List   Diagnosis Date Noted  . Acute encephalopathy 02/16/2020  . LGI bleed 03/27/2019  . GIB (gastrointestinal bleeding) 03/27/2019  . Rectal bleeding 03/27/2019  . Hydronephrosis concurrent with and due to  calculi of kidney and ureter   . Herpes labialis 06/18/2017  . Pyelonephritis   . Renal stone   . Abdominal pain, right lateral 06/16/2017  . Anemia 06/16/2017  . Insulin dependent diabetes mellitus 06/15/2017  . History of CVA (cerebrovascular accident) 05/07/2017  . Stroke ([place]) 05/07/2017  . Intracranial vascular stenosis   . TIA (transient ischemic attack) 11/07/2016  . Insulin dependent type 2 diabetes mellitus, uncontrolled ([place]) 11/07/2016  . HLD (hyperlipidemia) 11/07/2016  . HTN (hypertension) 11/07/2016  . 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 Noncompliance with diet and medication regimen 08/26/2015  . S/p reverse total shoulder arthroplasty   . Leukocytosis   . Proximal humerus fracture 10/26/2014  . Hyperlipidemia 10/23/2014  . Hypokalemia 10/09/2014  . Essential hypertension 10/09/2014  . Protein-calorie malnutrition, severe ([place]) 09/29/2014  . Severe sepsis ([place]) 09/28/2014  . Fever 09/28/2014  . Headache 09/28/2014  . Diabetes mellitus with neuropathy ([place])   . Pyrexia   . Uncontrolled hypertension 10/15/2012  . Neuropathic pain of both legs 10/15/2012  . Palpitations 05/21/2012  . Dyslipidemia 05/15/2012  . Hypertension   . Hyperlipidemia LDL goal < 100   . Diabetes mellitus type II, uncontrolled ([place])   . Varicose veins of lower extremities with other complications 03/15/7251    Past Surgical History:  Procedure Laterality  Date  . COLONOSCOPY WITH PROPOFOL Left 03/29/2019   Procedure: COLONOSCOPY WITH PROPOFOL;  Surgeon: Ronnette Juniper, MD;  Location: [facility] ENDOSCOPY;  Service: Gastroenterology;  Laterality: Left;  . FRACTURE SURGERY Left 2017   Shoulder  . HOLMIUM LASER APPLICATION Left 09/16/5463   Procedure: HOLMIUM LASER APPLICATION;  Surgeon: Cleon Gustin, MD;  Location: [facility] ORS;  Service: Urology;  Laterality: Left;  . LOOP RECORDER INSERTION N/A 05/09/2017   Procedure: LOOP RECORDER INSERTION;   Surgeon: Constance Haw, MD;  Location: [place] CV LAB;  Service: Cardiovascular;  Laterality: N/A;  . NEPHROLITHOTOMY Left 09/18/2017   Procedure: CYSTOSCOPY/LEFT URETEROSCOPIC STONE EXTRACTION/LEFT RETROGRADE/ STENT PLACEMENT;  Surgeon: Cleon Gustin, MD;  Location: [facility] OR[facility];  Service: Urology;  Laterality: Left;  . POLYPECTOMY  03/29/2019   Procedure: POLYPECTOMY;  Surgeon: Ronnette Juniper, MD;  Location: [facility];  Service: Gastroenterology;;  . REVERSE SHOULDER ARTHROPLASTY Left 10/27/2014   Procedure: REVERSE SHOULDER ARTHROPLASTY;  Surgeon: Renette Butters, MD;  Location: [place];  Service: Orthopedics;  Laterality: Left;  . TEE WITHOUT CARDIOVERSION N/A 05/09/2017   Procedure: TRANSESOPHAGEAL ECHOCARDIOGRAM (TEE);  Surgeon: Jerline Pain, MD;  Location: [facility] ENDOSCOPY;  Service: Cardiovascular;  Laterality: N/A;  . TOTAL SHOULDER ARTHROPLASTY Left 10/27/2014   Procedure: TOTAL SHOULDER ARTHROPLASTY;  Surgeon: Renette Butters, MD;  Location: [place];  Service: Orthopedics;  Laterality: Left;  Marland Kitchen VEIN SURGERY Right 1998?   right leg     OB History    Gravida  0   Para  0   Term  0   Preterm  0   AB  0   Living        SAB  0   TAB  0   Ectopic  0   Multiple      Live Births              Family History  Problem Relation Age of Onset  . Cancer Brother   . Heart disease Sister     Social History   Tobacco Use  . Smoking status: Never Smoker  . Smokeless tobacco: Never Used  Vaping Use  . Vaping Use: Never used  Substance Use Topics  . Alcohol use: No  . Drug use: Never    Home Medications Prior to Admission medications   Medication Sig Start Date End Date Taking? Authorizing Provider  aspirin 81 MG tablet Take 4 tablets (325 mg total) by mouth daily. Start 03/31/2019 03/29/19   Georgette Shell, MD  atorvastatin (LIPITOR) 80 MG tablet Take 1 tablet (80 mg total) by mouth daily. 09/24/19   Charlott Rakes, MD  blood glucose meter kit and  supplies KIT Dispense based on patient and insurance preference. Use up to four times daily as directed. (FOR ICD-9 250.00, 250.01). 11/09/16   Hosie Poisson, MD  Blood Glucose Monitoring Suppl (TRUE METRIX METER) DEVI 1 each by Does not apply route 3 (three) times daily before meals. 05/14/17   Charlott Rakes, MD  clopidogrel (PLAVIX) 75 MG tablet Take 1 tablet (75 mg total) by mouth daily. Start taking Plavix on 03/31/2019. 09/24/19   Charlott Rakes, MD  gabapentin (NEURONTIN) 300 MG capsule Take 1 capsule (300 mg total) by mouth at bedtime. 11/11/19   Charlott Rakes, MD  glucose blood (TRUE METRIX BLOOD GLUCOSE TEST) test strip Use 3 times daily before meals 12/30/18   Ladell Pier, MD  glucose blood test strip Use as instructed 11/09/16   Hosie Poisson, MD  insulin glargine (LANTUS) 100 UNIT/ML injection Inject 0.32 mLs (32 Units total) into the skin 2 (two) times daily. 09/24/19   Charlott Rakes, MD  Insulin Syringes, Disposable, U-100 0.3 ML MISC USE AS DIRECTED 08/14/17   Charlott Rakes, MD  isosorbide mononitrate (IMDUR) 60 MG 24 hr tablet Take 1 tablet (60 mg total) by mouth daily. 09/24/19   Charlott Rakes, MD  lisinopril (ZESTRIL) 20 MG tablet Take 1 tablet (20 mg total) by mouth daily. 09/24/19   Charlott Rakes, MD  naproxen (NAPROSYN) 500 MG tablet Take 1 tablet (500 mg total) by mouth 2 (two) times daily with a meal. 09/24/19   Newlin, Enobong, MD  senna-docusate (SENOKOT-S) 8.6-50 MG tablet Take 1 tablet by mouth at bedtime as needed for mild constipation. 05/09/17   Aline August, MD  TRUEPLUS INSULIN SYRINGE 31G X 5/16" 0.5 ML MISC USE AS DIRECTED. 01/01/20   Charlott Rakes, MD  TRUEPLUS LANCETS 28G MISC 1 each by Does not apply route 3 (three) times daily before meals. 05/14/17   Charlott Rakes, MD    Allergies    Patient has no known allergies.  Review of Systems   Review of Systems  Unable to perform ROS: Patient nonverbal    Physical Exam Updated Vital Signs BP (!)  135/56   Pulse 64   Temp (!) 94.4 F (34.7 C) (Rectal)   Resp 18   SpO2 97%   Physical Exam Vitals and nursing note reviewed.  Constitutional:      General: She is in acute distress.     Appearance: She is well-developed. She is obese. She is diaphoretic.  HENT:     Head: Normocephalic and atraumatic.     Mouth/Throat:     Mouth: Mucous membranes are moist.  Eyes:     General: No scleral icterus.       Right eye: No discharge.        Left eye: No discharge.     Conjunctiva/sclera: Conjunctivae normal.  Cardiovascular:     Rate and Rhythm: Normal rate and regular rhythm.     Pulses: Normal pulses.     Heart sounds: Normal heart sounds.  Pulmonary:     Effort: Pulmonary effort is normal. No respiratory distress.     Breath sounds: No stridor. Wheezing (Right upper lung) present.     Comments: Decreased respiratory effort, assisted with BVM Abdominal:     General: There is no distension.     Tenderness: There is no abdominal tenderness. There is no guarding.  Musculoskeletal:        General: No deformity.     Cervical back: Normal range of motion and neck supple.     Right lower leg: Edema present.     Left lower leg: Edema present.  Skin:    General: Skin is warm.  Neurological:     Motor: No abnormal muscle tone.     Comments: GCS3     ED Results / Procedures / Treatments   Labs (all labs ordered are listed, but only abnormal results are displayed) Labs Reviewed  COMPREHENSIVE METABOLIC PANEL - Abnormal; Notable for the following components:      Result Value   Potassium 3.0 (*)    Glucose, Bld 270 (*)    BUN 28 (*)    Creatinine, Ser 1.85 (*)    Calcium 8.0 (*)    Total Protein 5.2 (*)    Albumin 2.1 (*)    GFR, Estimated 28 (*)    All  other components within normal limits  CBC WITH DIFFERENTIAL/PLATELET - Abnormal; Notable for the following components:   Hemoglobin 11.3 (*)    MCH 25.6 (*)    All other components within normal limits  URINALYSIS,  COMPLETE (UACMP) WITH MICROSCOPIC - Abnormal; Notable for the following components:   Color, Urine AMBER (*)    APPearance CLOUDY (*)    Specific Gravity, Urine 1.031 (*)    Glucose, UA 150 (*)    Protein, ur >=300 (*)    Bacteria, UA RARE (*)    All other components within normal limits  CBG MONITORING, ED - Abnormal; Notable for the following components:   Glucose-Capillary 43 (*)    All other components within normal limits  CBG MONITORING, ED - Abnormal; Notable for the following components:   Glucose-Capillary 169 (*)    All other components within normal limits  I-STAT CHEM 8, ED - Abnormal; Notable for the following components:   Potassium 3.0 (*)    BUN 34 (*)    Creatinine, Ser 1.70 (*)    Glucose, Bld 275 (*)    Calcium, Ion 1.07 (*)    All other components within normal limits  I-STAT VENOUS BLOOD GAS, ED - Abnormal; Notable for the following components:   pCO2, Ven 42.7 (*)    pO2, Ven 184.0 (*)    Potassium 3.1 (*)    Calcium, Ion 1.08 (*)    HCT 34.0 (*)    Hemoglobin 11.6 (*)    All other components within normal limits  CBG MONITORING, ED - Abnormal; Notable for the following components:   Glucose-Capillary 132 (*)    All other components within normal limits  CULTURE, BLOOD (ROUTINE X 2)  CULTURE, BLOOD (ROUTINE X 2)  URINE CULTURE  RESP PANEL BY RT-PCR (FLU A&B, COVID) ARPGX2  RAPID URINE DRUG SCREEN, HOSP PERFORMED  ETHANOL  LACTIC ACID, PLASMA  BRAIN NATRIURETIC PEPTIDE  MAGNESIUM  TSH  CBG MONITORING, ED  I-STAT ARTERIAL BLOOD GAS, ED  CBG MONITORING, ED  TROPONIN I (HIGH SENSITIVITY)  TROPONIN I (HIGH SENSITIVITY)    EKG EKG Interpretation  Date/Time:  Monday February 16 2020 20:29:13 EST Ventricular Rate:  63 PR Interval:    QRS Duration: 103 QT Interval:  457 QTC Calculation: 468 R Axis:   17 Text Interpretation: Sinus rhythm Consider anterior infarct When compard to prior, similar appearnce. No STEMI Confirmed by Antony Blackbird  [phone]) on 02/16/2020 8:32:45 PM   Radiology CT HEAD WO CONTRAST  Result Date: 02/16/2020 CLINICAL DATA:  Mental status change EXAM: CT HEAD WITHOUT CONTRAST TECHNIQUE: Contiguous axial images were obtained from the base of the skull through the vertex without intravenous contrast. COMPARISON:  May 08, 2017. FINDINGS: Brain: No evidence of acute territorial infarction, hemorrhage, hydrocephalus,extra-axial collection or mass lesion/mass effect. There is dilatation the ventricles and sulci consistent with age-related atrophy. Low-attenuation changes in the deep white matter consistent with small vessel ischemia. Areas of prior encephalomalacia involving the posterior right parietooccipital lobe. Vascular: No hyperdense vessel or unexpected calcification. Skull: The skull is intact. No fracture or focal lesion identified. Sinuses/Orbits: The visualized paranasal sinuses and mastoid air cells are clear. The orbits and globes intact. Other: None IMPRESSION: No acute intracranial abnormality. Findings consistent with age related atrophy and chronic small vessel ischemia Area of encephalomalacia involving the right parieto-occipital lobe. Electronically Signed   By: Prudencio Pair M.D.   On: 02/16/2020 21:28   DG Chest Port 1 View  Result Date: 02/16/2020 CLINICAL  DATA:  Change in mental status EXAM: PORTABLE CHEST 1 VIEW COMPARISON:  None. FINDINGS: The heart size and mediastinal contours are within normal limits. Aortic knob calcifications are seen. Both lungs are clear. 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 Degenerative changes in the midthoracic spine. IMPRESSION: No active disease. Electronically Signed   By: Prudencio Pair M.D.   On: 02/16/2020 21:04    Procedures .Critical Care Performed by: Lorin Glass, PA-C Authorized by: Lorin Glass, PA-C   Critical care provider statement:    Critical care time (minutes):  45   Critical care time was exclusive of:  Separately billable  procedures and treating other patients and teaching time   Critical care was time spent personally by me on the following activities:  Discussions with consultants, evaluation of patient's response to treatment, examination of patient, ordering and performing treatments and interventions, ordering and review of laboratory studies, ordering and review of radiographic studies, pulse oximetry, re-evaluation of patient's condition, obtaining history from patient or surrogate and review of old charts   (including critical care time)  Medications Ordered in ED Medications  dextrose 10 % infusion ( Intravenous New Bag/Given 02/16/20 2142)  potassium chloride 10 mEq in 100 mL IVPB (10 mEq Intravenous New Bag/Given 02/16/20 2240)  cefTRIAXone (ROCEPHIN) 1 g in sodium chloride 0.9 % 100 mL IVPB (has no administration in time range)  dextrose 50 % solution ( Intravenous Canceled Entry 02/16/20 2045)  thiamine (B-1) injection 100 mg (100 mg Intravenous Given 02/16/20 2145)  dextrose 50 % solution 25 mL (25 mLs Intravenous Given 02/16/20 2143)  lactated ringers bolus 1,000 mL (1,000 mLs Intravenous New Bag/Given 02/16/20 2239)    ED Course  I have reviewed the triage vital signs and the nursing notes.  Pertinent labs & imaging results that were available during my care of the patient were reviewed by me and considered in my medical decision making (see chart for details).  Clinical Course as of Dec 07 0000  Mon Feb 16, 2020  2135 I was informed of hypothermia at 94.4.  Bair hugger is ordered.  Repeat CBG is 83 which is downtrending.  I ordered half an amp of D50 and a D10 drip.  Patient is still slow to respond however is more responsive now.   [EH]  2224 Fluids ordered  Specific Gravity, Urine(!): 1.031 [EH]    Clinical Course User Index [EH] Ollen Gross   MDM Rules/Calculators/A&P                         Patient is a 79 year old woman who presents today initially unresponsive.  She was  found to be hypoglycemic and diaphoretic with initial GCS of 3.  She was treated with IV D50, after which her mental status improved however she still remained lethargic.  It is unclear how long she had been hypoglycemic for, SPECT a minimum of 1 hour.  CT head obtained without hemorrhage, or acute intracranial abnormality.  She is spontaneously moves all 4 extremities.  After glucose she is awake and alert, oriented x4 and responds to commands.    VBG shows PCO2 of 42.7.   *** Final Clinical Impression(s) / ED Diagnoses Final diagnoses:  Hypoglycemia  Enteropathy  Hypokalemia    Rx / DC Orders ED Discharge Orders    None

## 2020-02-16 NOTE — ED Provider Notes (Signed)
[facility] EMERGENCY DEPARTMENT Provider Note   CSN: [id number] Arrival date & time: 02/16/20  2017     History Chief Complaint  Patient presents with  . Unresponsive    Meghan Ford is a 79 y.o. female with a past medical history of DM 2, stroke, who presents today for evaluation of unresponsive.  Level 5 caveat for altered mental status  Patient presents POV unresponsive.  Patient's family member reports that she called him earlier today and told him that her sugar was low when she drank a Coke.  Patient is nonverbal on arrival.    Patient's family member reports "she does not tell me when she falls anymore."  No known fall or trauma.  HPI     Past Medical History:  Diagnosis Date  . Arthritis    bil knees  . Blurred vision, left eye   . Blurry vision, bilateral   . Diabetes mellitus type II, uncontrolled ([place])   . Diabetes mellitus without complication ([place])   . Dysrhythmia    patient via intepreter reports at time of last stroke , her heartbeat was very irregular ; HR WDL today, patient denies any headache, chest pain, new numbness or new worsening vision since last stroke   . Fx humeral neck   . Gait disturbance   . History of kidney stones   . Hyperlipidemia LDL goal < 100   . Hypertension   . Left arm weakness    ambulates with cane   . Leg pain    worse with prolonged standing  . Peripheral vascular disease ([place])   . Pyelonephritis 06/14/2017  . Sepsis ([place]) 06/14/2017  . Status post placement of implantable loop recorder   . Stroke [facility]) 2018; 05/08/2017   denies residual from 2018 stroke on 05/08/2017; weakness on left; speech issues from today's stroke (05/08/2017)  . Varicose veins     Patient Active Problem List   Diagnosis Date Noted  . Acute encephalopathy 02/16/2020  . LGI bleed 03/27/2019  . GIB (gastrointestinal bleeding) 03/27/2019  . Rectal bleeding 03/27/2019  . Hydronephrosis concurrent with and due to  calculi of kidney and ureter   . Herpes labialis 06/18/2017  . Pyelonephritis   . Renal stone   . Abdominal pain, right lateral 06/16/2017  . Anemia 06/16/2017  . Insulin dependent diabetes mellitus 06/15/2017  . History of CVA (cerebrovascular accident) 05/07/2017  . Stroke ([place]) 05/07/2017  . Intracranial vascular stenosis   . TIA (transient ischemic attack) 11/07/2016  . Insulin dependent type 2 diabetes mellitus, uncontrolled ([place]) 11/07/2016  . HLD (hyperlipidemia) 11/07/2016  . HTN (hypertension) 11/07/2016  . 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 Noncompliance with diet and medication regimen 08/26/2015  . S/p reverse total shoulder arthroplasty   . Leukocytosis   . Proximal humerus fracture 10/26/2014  . Hyperlipidemia 10/23/2014  . Hypokalemia 10/09/2014  . Essential hypertension 10/09/2014  . Protein-calorie malnutrition, severe ([place]) 09/29/2014  . Severe sepsis ([place]) 09/28/2014  . Fever 09/28/2014  . Headache 09/28/2014  . Diabetes mellitus with neuropathy ([place])   . Pyrexia   . Uncontrolled hypertension 10/15/2012  . Neuropathic pain of both legs 10/15/2012  . Palpitations 05/21/2012  . Dyslipidemia 05/15/2012  . Hypertension   . Hyperlipidemia LDL goal < 100   . Diabetes mellitus type II, uncontrolled ([place])   . Varicose veins of lower extremities with other complications 03/15/7251    Past Surgical History:  Procedure Laterality  Date  . COLONOSCOPY WITH PROPOFOL Left 03/29/2019   Procedure: COLONOSCOPY WITH PROPOFOL;  Surgeon: Ronnette Juniper, MD;  Location: [facility] ENDOSCOPY;  Service: Gastroenterology;  Laterality: Left;  . FRACTURE SURGERY Left 2017   Shoulder  . HOLMIUM LASER APPLICATION Left 09/16/5463   Procedure: HOLMIUM LASER APPLICATION;  Surgeon: Cleon Gustin, MD;  Location: [facility] ORS;  Service: Urology;  Laterality: Left;  . LOOP RECORDER INSERTION N/A 05/09/2017   Procedure: LOOP RECORDER INSERTION;   Surgeon: Constance Haw, MD;  Location: [place] CV LAB;  Service: Cardiovascular;  Laterality: N/A;  . NEPHROLITHOTOMY Left 09/18/2017   Procedure: CYSTOSCOPY/LEFT URETEROSCOPIC STONE EXTRACTION/LEFT RETROGRADE/ STENT PLACEMENT;  Surgeon: Cleon Gustin, MD;  Location: [facility] OR[facility];  Service: Urology;  Laterality: Left;  . POLYPECTOMY  03/29/2019   Procedure: POLYPECTOMY;  Surgeon: Ronnette Juniper, MD;  Location: [facility];  Service: Gastroenterology;;  . REVERSE SHOULDER ARTHROPLASTY Left 10/27/2014   Procedure: REVERSE SHOULDER ARTHROPLASTY;  Surgeon: Renette Butters, MD;  Location: [place];  Service: Orthopedics;  Laterality: Left;  . TEE WITHOUT CARDIOVERSION N/A 05/09/2017   Procedure: TRANSESOPHAGEAL ECHOCARDIOGRAM (TEE);  Surgeon: Jerline Pain, MD;  Location: [facility] ENDOSCOPY;  Service: Cardiovascular;  Laterality: N/A;  . TOTAL SHOULDER ARTHROPLASTY Left 10/27/2014   Procedure: TOTAL SHOULDER ARTHROPLASTY;  Surgeon: Renette Butters, MD;  Location: [place];  Service: Orthopedics;  Laterality: Left;  Marland Kitchen VEIN SURGERY Right 1998?   right leg     OB History    Gravida  0   Para  0   Term  0   Preterm  0   AB  0   Living        SAB  0   TAB  0   Ectopic  0   Multiple      Live Births              Family History  Problem Relation Age of Onset  . Cancer Brother   . Heart disease Sister     Social History   Tobacco Use  . Smoking status: Never Smoker  . Smokeless tobacco: Never Used  Vaping Use  . Vaping Use: Never used  Substance Use Topics  . Alcohol use: No  . Drug use: Never    Home Medications Prior to Admission medications   Medication Sig Start Date End Date Taking? Authorizing Provider  aspirin 81 MG tablet Take 4 tablets (325 mg total) by mouth daily. Start 03/31/2019 03/29/19   Georgette Shell, MD  atorvastatin (LIPITOR) 80 MG tablet Take 1 tablet (80 mg total) by mouth daily. 09/24/19   Charlott Rakes, MD  blood glucose meter kit and  supplies KIT Dispense based on patient and insurance preference. Use up to four times daily as directed. (FOR ICD-9 250.00, 250.01). 11/09/16   Hosie Poisson, MD  Blood Glucose Monitoring Suppl (TRUE METRIX METER) DEVI 1 each by Does not apply route 3 (three) times daily before meals. 05/14/17   Charlott Rakes, MD  clopidogrel (PLAVIX) 75 MG tablet Take 1 tablet (75 mg total) by mouth daily. Start taking Plavix on 03/31/2019. 09/24/19   Charlott Rakes, MD  gabapentin (NEURONTIN) 300 MG capsule Take 1 capsule (300 mg total) by mouth at bedtime. 11/11/19   Charlott Rakes, MD  glucose blood (TRUE METRIX BLOOD GLUCOSE TEST) test strip Use 3 times daily before meals 12/30/18   Ladell Pier, MD  glucose blood test strip Use as instructed 11/09/16   Hosie Poisson, MD  insulin glargine (LANTUS) 100 UNIT/ML injection Inject 0.32 mLs (32 Units total) into the skin 2 (two) times daily. 09/24/19   Charlott Rakes, MD  Insulin Syringes, Disposable, U-100 0.3 ML MISC USE AS DIRECTED 08/14/17   Charlott Rakes, MD  isosorbide mononitrate (IMDUR) 60 MG 24 hr tablet Take 1 tablet (60 mg total) by mouth daily. 09/24/19   Charlott Rakes, MD  lisinopril (ZESTRIL) 20 MG tablet Take 1 tablet (20 mg total) by mouth daily. 09/24/19   Charlott Rakes, MD  naproxen (NAPROSYN) 500 MG tablet Take 1 tablet (500 mg total) by mouth 2 (two) times daily with a meal. 09/24/19   Newlin, Enobong, MD  senna-docusate (SENOKOT-S) 8.6-50 MG tablet Take 1 tablet by mouth at bedtime as needed for mild constipation. 05/09/17   Aline August, MD  TRUEPLUS INSULIN SYRINGE 31G X 5/16" 0.5 ML MISC USE AS DIRECTED. 01/01/20   Charlott Rakes, MD  TRUEPLUS LANCETS 28G MISC 1 each by Does not apply route 3 (three) times daily before meals. 05/14/17   Charlott Rakes, MD    Allergies    Patient has no known allergies.  Review of Systems   Review of Systems  Unable to perform ROS: Patient nonverbal    Physical Exam Updated Vital Signs BP (!)  135/56   Pulse 64   Temp (!) 94.4 F (34.7 C) (Rectal)   Resp 18   SpO2 97%   Physical Exam Vitals and nursing note reviewed.  Constitutional:      General: She is in acute distress.     Appearance: She is well-developed. She is obese. She is diaphoretic.  HENT:     Head: Normocephalic and atraumatic.     Mouth/Throat:     Mouth: Mucous membranes are moist.  Eyes:     General: No scleral icterus.       Right eye: No discharge.        Left eye: No discharge.     Conjunctiva/sclera: Conjunctivae normal.  Cardiovascular:     Rate and Rhythm: Normal rate and regular rhythm.     Pulses: Normal pulses.     Heart sounds: Normal heart sounds.  Pulmonary:     Effort: Pulmonary effort is normal. No respiratory distress.     Breath sounds: No stridor. Wheezing (Right upper lung) present.     Comments: Decreased respiratory effort, assisted with BVM Abdominal:     General: There is no distension.     Tenderness: There is no abdominal tenderness. There is no guarding.  Musculoskeletal:        General: No deformity.     Cervical back: Normal range of motion and neck supple.     Right lower leg: Edema present.     Left lower leg: Edema present.  Skin:    General: Skin is warm.  Neurological:     Motor: No abnormal muscle tone.     Comments: GCS3     ED Results / Procedures / Treatments   Labs (all labs ordered are listed, but only abnormal results are displayed) Labs Reviewed  COMPREHENSIVE METABOLIC PANEL - Abnormal; Notable for the following components:      Result Value   Potassium 3.0 (*)    Glucose, Bld 270 (*)    BUN 28 (*)    Creatinine, Ser 1.85 (*)    Calcium 8.0 (*)    Total Protein 5.2 (*)    Albumin 2.1 (*)    GFR, Estimated 28 (*)    All  other components within normal limits  CBC WITH DIFFERENTIAL/PLATELET - Abnormal; Notable for the following components:   Hemoglobin 11.3 (*)    MCH 25.6 (*)    All other components within normal limits  URINALYSIS,  COMPLETE (UACMP) WITH MICROSCOPIC - Abnormal; Notable for the following components:   Color, Urine AMBER (*)    APPearance CLOUDY (*)    Specific Gravity, Urine 1.031 (*)    Glucose, UA 150 (*)    Protein, ur >=300 (*)    Bacteria, UA RARE (*)    All other components within normal limits  CBG MONITORING, ED - Abnormal; Notable for the following components:   Glucose-Capillary 43 (*)    All other components within normal limits  CBG MONITORING, ED - Abnormal; Notable for the following components:   Glucose-Capillary 169 (*)    All other components within normal limits  I-STAT CHEM 8, ED - Abnormal; Notable for the following components:   Potassium 3.0 (*)    BUN 34 (*)    Creatinine, Ser 1.70 (*)    Glucose, Bld 275 (*)    Calcium, Ion 1.07 (*)    All other components within normal limits  I-STAT VENOUS BLOOD GAS, ED - Abnormal; Notable for the following components:   pCO2, Ven 42.7 (*)    pO2, Ven 184.0 (*)    Potassium 3.1 (*)    Calcium, Ion 1.08 (*)    HCT 34.0 (*)    Hemoglobin 11.6 (*)    All other components within normal limits  CBG MONITORING, ED - Abnormal; Notable for the following components:   Glucose-Capillary 132 (*)    All other components within normal limits  CULTURE, BLOOD (ROUTINE X 2)  CULTURE, BLOOD (ROUTINE X 2)  URINE CULTURE  RESP PANEL BY RT-PCR (FLU A&B, COVID) ARPGX2  RAPID URINE DRUG SCREEN, HOSP PERFORMED  ETHANOL  LACTIC ACID, PLASMA  BRAIN NATRIURETIC PEPTIDE  MAGNESIUM  TSH  CBG MONITORING, ED  I-STAT ARTERIAL BLOOD GAS, ED  CBG MONITORING, ED  TROPONIN I (HIGH SENSITIVITY)  TROPONIN I (HIGH SENSITIVITY)    EKG EKG Interpretation  Date/Time:  Monday February 16 2020 20:29:13 EST Ventricular Rate:  63 PR Interval:    QRS Duration: 103 QT Interval:  457 QTC Calculation: 468 R Axis:   17 Text Interpretation: Sinus rhythm Consider anterior infarct When compard to prior, similar appearnce. No STEMI Confirmed by Antony Blackbird  [phone]) on 02/16/2020 8:32:45 PM   Radiology CT HEAD WO CONTRAST  Result Date: 02/16/2020 CLINICAL DATA:  Mental status change EXAM: CT HEAD WITHOUT CONTRAST TECHNIQUE: Contiguous axial images were obtained from the base of the skull through the vertex without intravenous contrast. COMPARISON:  May 08, 2017. FINDINGS: Brain: No evidence of acute territorial infarction, hemorrhage, hydrocephalus,extra-axial collection or mass lesion/mass effect. There is dilatation the ventricles and sulci consistent with age-related atrophy. Low-attenuation changes in the deep white matter consistent with small vessel ischemia. Areas of prior encephalomalacia involving the posterior right parietooccipital lobe. Vascular: No hyperdense vessel or unexpected calcification. Skull: The skull is intact. No fracture or focal lesion identified. Sinuses/Orbits: The visualized paranasal sinuses and mastoid air cells are clear. The orbits and globes intact. Other: None IMPRESSION: No acute intracranial abnormality. Findings consistent with age related atrophy and chronic small vessel ischemia Area of encephalomalacia involving the right parieto-occipital lobe. Electronically Signed   By: Prudencio Pair M.D.   On: 02/16/2020 21:28   DG Chest Port 1 View  Result Date: 02/16/2020 CLINICAL  DATA:  Change in mental status EXAM: PORTABLE CHEST 1 VIEW COMPARISON:  None. FINDINGS: The heart size and mediastinal contours are within normal limits. Aortic knob calcifications are seen. Both lungs are clear. 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 Degenerative changes in the midthoracic spine. IMPRESSION: No active disease. Electronically Signed   By: Prudencio Pair M.D.   On: 02/16/2020 21:04    Procedures .Critical Care Performed by: Lorin Glass, PA-C Authorized by: Lorin Glass, PA-C   Critical care provider statement:    Critical care time (minutes):  45   Critical care time was exclusive of:  Separately billable  procedures and treating other patients and teaching time   Critical care was time spent personally by me on the following activities:  Discussions with consultants, evaluation of patient's response to treatment, examination of patient, ordering and performing treatments and interventions, ordering and review of laboratory studies, ordering and review of radiographic studies, pulse oximetry, re-evaluation of patient's condition, obtaining history from patient or surrogate and review of old charts   (including critical care time)  Medications Ordered in ED Medications  dextrose 10 % infusion ( Intravenous New Bag/Given 02/16/20 2142)  potassium chloride 10 mEq in 100 mL IVPB (10 mEq Intravenous New Bag/Given 02/16/20 2240)  cefTRIAXone (ROCEPHIN) 1 g in sodium chloride 0.9 % 100 mL IVPB (has no administration in time range)  dextrose 50 % solution ( Intravenous Canceled Entry 02/16/20 2045)  thiamine (B-1) injection 100 mg (100 mg Intravenous Given 02/16/20 2145)  dextrose 50 % solution 25 mL (25 mLs Intravenous Given 02/16/20 2143)  lactated ringers bolus 1,000 mL (1,000 mLs Intravenous New Bag/Given 02/16/20 2239)    ED Course  I have reviewed the triage vital signs and the nursing notes.  Pertinent labs & imaging results that were available during my care of the patient were reviewed by me and considered in my medical decision making (see chart for details).  Clinical Course as of Dec 07 0000  Mon Feb 16, 2020  2135 I was informed of hypothermia at 94.4.  Bair hugger is ordered.  Repeat CBG is 83 which is downtrending.  I ordered half an amp of D50 and a D10 drip.  Patient is still slow to respond however is more responsive now.   [EH]  2224 Fluids ordered  Specific Gravity, Urine(!): 1.031 [EH]    Clinical Course User Index [EH] Ollen Gross   MDM Rules/Calculators/A&P                         Patient is a 79 year old woman who presents today initially unresponsive.  She was  found to be hypoglycemic and diaphoretic with initial GCS of 3.  She was treated with IV D50, after which her mental status improved however she still remained lethargic.  It is unclear how long she had been hypoglycemic for, SPECT a minimum of 1 hour.  CT head obtained without hemorrhage, or acute intracranial abnormality.  She is spontaneously moves all 4 extremities.  After glucose she is awake and alert, oriented x4 and responds to commands.    VBG shows PCO2 of 42.7.  UDS is negative, ethanol is undetected.  She is hypokalemic, however does not appear to be safe for p.o. intake at this time.  IV potassium is ordered.  Magnesium level is also ordered.  Patient is given thiamine due to unclear history in the setting of hypoglycemia.  After patient was treated with D50  her sugar started dipping back down to 80s, at that point she is given half an amp of D50 and started on a D10 drip.  Patient was reevaluated multiple times.  She was found to be hypothermic, and started on [facility].  UA does show rare bacteria with 6-10 squamous epithelial cells.  No clear source of bacterial infection, however given her altered mental status, slowness to respond and hypothermia blood and urine cultures are sent.  She is ordered a one-time dose of Rocephin due to bacteriuria and inability to clearly determine if she is having symptoms consistent with a UTI or not.  Lactic acid is not elevated and she does not have a significant leukocytosis, does not clearly meet Sirs/sepsis criteria at this time.    Will require admission.  I spoke with hospitalist who will see patient for admission.  Note: Portions of this report may have been transcribed using voice recognition software. Every effort was made to ensure accuracy; however, inadvertent computerized transcription errors may be present  Final Clinical Impression(s) / ED Diagnoses Final diagnoses:  Hypoglycemia  Enteropathy  Hypokalemia    Rx / DC Orders ED  Discharge Orders    None       Lorin Glass, PA-C 02/17/20 0005    Tegeler, Gwenyth Allegra, MD 02/17/20 1734

## 2020-02-16 NOTE — H&P (Signed)
History and Physical   Meghan Ford ZOX:[id number] DOB: [birth year] DOA: 02/16/2020  PCP: Charlott Rakes, MD   Patient coming from: Home  Chief Complaint: Altered renal status  HPI: Meghan Ford is a 79 y.o. female with medical history significant of anemia, CVA, diabetes, hyperlipidemia, hypertension, GI bleed who presents with altered mental status.  Due to patient lethargy and language barrier history was obtained with son acting as Optometrist and as primary historian with the assistance of chart review.  Patient's son called her around 3 PM and she said that her blood sugar was low and that she was going to drink a Coke and see if that would help.  He did not hear from her until around 62 when his sister-in-law went to see her and she was not responding and [place] get up.  EMS was called and she was brought to the ED.  Her glucose was 43 on arrival on arrival as she was very lethargic responsive to only painful stimuli at that time.  At times she has mentioned she had headache and chest pain the day prior and at other times she denies this unclear if true.  According to her son she had no other specific complaints when I spoke other than her blood sugar being low.  ED Course: In the ED vital signs significant for temperature of 94.4 initially.  Lab work showed BMP with potassium of 3.0, creatinine of 1.85 from a baseline of 1.2, glucose improved to 270 at that time.  LFTs showed calcium of 8 which corrects considering albumin of 2.1, protein of 5.2.  CBC showed stable anemia around 11.  Ethanol was negative, troponin initial was negative with repeat pending, UDS was negative, lactic acid was normal with repeat pending, BNP was normal, respiratory panel for flu and Covid is pending.  VBG showed normal pH with PCO2 of 42 after being bagged initially in the ED.  Urinalysis showed protein glucose some white cells and rare bacteria.  Urine cultures and blood cultures are  pending.  Patient received 1 L IV fluids, dextrose, IV potassium, thiamine, and was placed on active rewarming in ED.  Review of Systems: As per HPI otherwise all other systems reviewed and are negative.  Past Medical History:  Diagnosis Date  . Arthritis    bil knees  . Blurred vision, left eye   . Blurry vision, bilateral   . Diabetes mellitus type II, uncontrolled ([place])   . Diabetes mellitus without complication ([place])   . Dysrhythmia    patient via intepreter reports at time of last stroke , her heartbeat was very irregular ; HR WDL today, patient denies any headache, chest pain, new numbness or new worsening vision since last stroke   . Fever 09/28/2014  . Fx humeral neck   . Gait disturbance   . Headache 09/28/2014  . History of kidney stones   . Hyperlipidemia LDL goal < 100   . Hypertension   . Left arm weakness    ambulates with cane   . Leg pain    worse with prolonged standing  . Peripheral vascular disease ([place])   . Pyelonephritis 06/14/2017  . Sepsis ([place]) 06/14/2017  . Status post placement of implantable loop recorder   . Stroke [facility]) 2018; 05/08/2017   denies residual from 2018 stroke on 05/08/2017; weakness on left; speech issues from today's stroke (05/08/2017)  . Varicose veins     Past Surgical History:  Procedure Laterality Date  . COLONOSCOPY WITH PROPOFOL Left 03/29/2019  Procedure: COLONOSCOPY WITH PROPOFOL;  Surgeon: Ronnette Juniper, MD;  Location: [facility] ENDOS[facility];  Service: Gastroenterology;  Laterality: Left;  . FRACTURE SURGERY Left 2017   Shoulder  . HOLMIUM LASER APPLICATION Left 07/14/6642   Procedure: HOLMIUM LASER APPLICATION;  Surgeon: Cleon Gustin, MD;  Location: [facility] ORS;  Service: Urology;  Laterality: Left;  . LOOP RECORDER INSERTION N/A 05/09/2017   Procedure: LOOP RECORDER INSERTION;  Surgeon: Constance Haw, MD;  Location: [facility] LAB;  Service: Cardiovascular;  Laterality: N/A;  . NEPHROLITHOTOMY Left 09/18/2017   Procedure:  CYSTOSCOPY/LEFT URETEROSCOPIC STONE EXTRACTION/LEFT RETROGRADE/ STENT PLACEMENT;  Surgeon: Cleon Gustin, MD;  Location: [facility];  Service: Urology;  Laterality: Left;  . POLYPECTOMY  03/29/2019   Procedure: POLYPECTOMY;  Surgeon: Ronnette Juniper, MD;  Location: [facility];  Service: Gastroenterology;;  . REVERSE SHOULDER ARTHROPLASTY Left 10/27/2014   Procedure: REVERSE SHOULDER ARTHROPLASTY;  Surgeon: Renette Butters, MD;  Location: [facility];  Service: Orthopedics;  Laterality: Left;  . TEE WITHOUT CARDIOVERSION N/A 05/09/2017   Procedure: TRANSESOPHAGEAL ECHOCARDIOGRAM (TEE);  Surgeon: Jerline Pain, MD;  Location: [facility];  Service: Cardiovascular;  Laterality: N/A;  . TOTAL SHOULDER ARTHROPLASTY Left 10/27/2014   Procedure: TOTAL SHOULDER ARTHROPLASTY;  Surgeon: Renette Butters, MD;  Location: [facility];  Service: Orthopedics;  Laterality: Left;  Marland Kitchen VEIN SURGERY Right 1998?   right leg    Social History  reports that she has never smoked. She has never used smokeless tobacco. She reports that she does not drink alcohol and does not use drugs.  No Known Allergies  Family History  Problem Relation Age of Onset  . Cancer Brother   . Heart disease Sister   Reviewed on admission  Prior to Admission medications   Medication Sig Start Date End Date Taking? Authorizing Provider  aspirin 81 MG tablet Take 4 tablets (325 mg total) by mouth daily. Start 03/31/2019 03/29/19   Georgette Shell, MD  atorvastatin (LIPITOR) 80 MG tablet Take 1 tablet (80 mg total) by mouth daily. 09/24/19   Charlott Rakes, MD  blood glucose meter kit and supplies KIT Dispense based on patient and insurance preference. Use up to four times daily as directed. (FOR ICD-9 250.00, 250.01). 11/09/16   Hosie Poisson, MD  Blood Glucose Monitoring Suppl (TRUE METRIX METER) DEVI 1 each by Does not apply route 3 (three) times daily before meals. 05/14/17   Charlott Rakes, MD  clopidogrel (PLAVIX) 75 MG tablet Take 1 tablet  (75 mg total) by mouth daily. Start taking Plavix on 03/31/2019. 09/24/19   Charlott Rakes, MD  gabapentin (NEURONTIN) 300 MG capsule Take 1 capsule (300 mg total) by mouth at bedtime. 11/11/19   Charlott Rakes, MD  glucose blood (TRUE METRIX BLOOD GLUCOSE TEST) test strip Use 3 times daily before meals 12/30/18   Karle Plumber B, MD  glucose blood test strip Use as instructed 11/09/16   Hosie Poisson, MD  insulin glargine (LANTUS) 100 UNIT/ML injection Inject 0.32 mLs (32 Units total) into the skin 2 (two) times daily. 09/24/19   Charlott Rakes, MD  Insulin Syringes, Disposable, U-100 0.3 ML MISC USE AS DIRECTED 08/14/17   Charlott Rakes, MD  isosorbide mononitrate (IMDUR) 60 MG 24 hr tablet Take 1 tablet (60 mg total) by mouth daily. 09/24/19   Charlott Rakes, MD  lisinopril (ZESTRIL) 20 MG tablet Take 1 tablet (20 mg total) by mouth daily. 09/24/19   Charlott Rakes, MD  naproxen (NAPROSYN) 500 MG tablet Take 1  tablet (500 mg total) by mouth 2 (two) times daily with a meal. 09/24/19   Newlin, Charlane Ferretti, MD  senna-docusate (SENOKOT-S) 8.6-50 MG tablet Take 1 tablet by mouth at bedtime as needed for mild constipation. 05/09/17   Aline August, MD  TRUEPLUS INSULIN SYRINGE 31G X 5/16" 0.5 ML MISC USE AS DIRECTED. 01/01/20   Charlott Rakes, MD  TRUEPLUS LANCETS 28G MISC 1 each by Does not apply route 3 (three) times daily before meals. 05/14/17   Charlott Rakes, MD    Physical Exam: Vitals:   02/16/20 2230 02/16/20 2245 02/16/20 2300 02/16/20 2315  BP: (!) 122/52 (!) 147/54 (!) 147/56 (!) 135/56  Pulse: (!) 59 62 65 64  Resp: _0 Temp:      TempSrc:      SpO2: 96% 96% 97% 97%   Physical Exam Constitutional:      General: She is not in acute distress.    Appearance: She is obese.     Comments: Lethargic  HENT:     Head: Normocephalic and atraumatic.     Mouth/Throat:     Mouth: Mucous membranes are moist.     Pharynx: Oropharynx is clear.  Eyes:     Extraocular Movements:  Extraocular movements intact.     Pupils: Pupils are equal, round, and reactive to light.  Cardiovascular:     Rate and Rhythm: Normal rate and regular rhythm.     Pulses: Normal pulses.     Heart sounds: Murmur heard.   Pulmonary:     Effort: Pulmonary effort is normal. No respiratory distress.     Breath sounds: Normal breath sounds.  Abdominal:     General: Bowel sounds are normal. There is no distension.     Palpations: Abdomen is soft.     Tenderness: There is no abdominal tenderness.  Musculoskeletal:        General: No swelling or deformity.  Skin:    General: Skin is warm and dry.  Neurological:     General: No focal deficit present.     Mental Status: She is oriented to person, place, and time.     Comments: Alert and oriented with son asking orientation questions Lethargic    Labs on Admission: I have personally reviewed following labs and imaging studies  CBC: Recent Labs  Lab 02/16/20 2033 02/16/20 2059 02/16/20 2101  WBC 9.3  --   --   NEUTROABS 5.0  --   --   HGB 11.3* 12.2 11.6*  HCT 36.4 36.0 34.0*  MCV 82.4  --   --   PLT 239  --   --     Basic Metabolic Panel: Recent Labs  Lab 02/16/20 2033 02/16/20 2059 02/16/20 2101  NA 139 139 139  K 3.0* 3.0* 3.1*  CL 106 106  --   CO2 22  --   --   GLUCOSE 270* 275*  --   BUN 28* 34*  --   CREATININE 1.85* 1.70*  --   CALCIUM 8.0*  --   --     GFR: CrCl cannot be calculated (Unknown ideal weight.).  Liver Function Tests: Recent Labs  Lab 02/16/20 2033  AST 31  ALT 21  ALKPHOS 74  BILITOT 0.6  PROT 5.2*  ALBUMIN 2.1*    Urine analysis:    Component Value Date/Time   COLORURINE AMBER (A) 02/16/2020 2135   APPEARANCEUR CLOUDY (A) 02/16/2020 2135   LABSPEC 1.031 (H) 02/16/2020 2135   PHURINE 5.0  02/16/2020 2135   GLUCOSEU 150 (A) 02/16/2020 2135   HGBUR NEGATIVE 02/16/2020 2135   BILIRUBINUR NEGATIVE 02/16/2020 2135   BILIRUBINUR negative 08/25/2015 1515   KETONESUR NEGATIVE  02/16/2020 2135   PROTEINUR >=300 (A) 02/16/2020 2135   UROBILINOGEN 1.0 08/25/2015 1515   UROBILINOGEN 0.2 10/27/2014 2321   NITRITE NEGATIVE 02/16/2020 2135   LEUKOCYTESUR NEGATIVE 02/16/2020 2135    Radiological Exams on Admission: CT HEAD WO CONTRAST  Result Date: 02/16/2020 CLINICAL DATA:  Mental status change EXAM: CT HEAD WITHOUT CONTRAST TECHNIQUE: Contiguous axial images were obtained from the base of the skull through the vertex without intravenous contrast. COMPARISON:  May 08, 2017. FINDINGS: Brain: No evidence of acute territorial infarction, hemorrhage, hydrocephalus,extra-axial collection or mass lesion/mass effect. There is dilatation the ventricles and sulci consistent with age-related atrophy. Low-attenuation changes in the deep white matter consistent with small vessel ischemia. Areas of prior encephalomalacia involving the posterior right parietooccipital lobe. Vascular: No hyperdense vessel or unexpected calcification. Skull: The skull is intact. No fracture or focal lesion identified. Sinuses/Orbits: The visualized paranasal sinuses and mastoid air cells are clear. The orbits and globes intact. Other: None IMPRESSION: No acute intracranial abnormality. Findings consistent with age related atrophy and chronic small vessel ischemia Area of encephalomalacia involving the right parieto-occipital lobe. Electronically Signed   By: Prudencio Pair M.D.   On: 02/16/2020 21:28   DG Chest Port 1 View  Result Date: 02/16/2020 CLINICAL DATA:  Change in mental status EXAM: PORTABLE CHEST 1 VIEW COMPARISON:  None. FINDINGS: The heart size and mediastinal contours are within normal limits. Aortic knob calcifications are seen. Both lungs are clear. 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 Degenerative changes in the midthoracic spine. IMPRESSION: No active disease. Electronically Signed   By: Prudencio Pair M.D.   On: 02/16/2020 21:04    TKZ:SWFUXN to view EKGs at this time due to technical  difficulties with EMR.  Assessment/Plan Principal Problem:   Acute encephalopathy Active Problems:   Diabetes mellitus type II, uncontrolled (HCC)   Diabetes mellitus with neuropathy (HCC)   Hypokalemia   Essential hypertension   HLD (hyperlipidemia)   History of CVA (cerebrovascular accident)  Hypoglycemia Acute encephalopathy > Patient with known episode of hypoglycemia at home did not respond to soft drink > Presented unresponsive requiring bagging with CBG in the 40s, now on dextrose drip, after several pushes did not maintain glucose levels > Patient's mentation has improved she is now alert and oriented with son translating, but remains lethargic > Most likely etiology of her acute encephalopathy is her hypoglycemia, however she remains lethargic so additional work-up continues.  She received a dose of empiric ceftriaxone in ED > Lab work-up in ED showed normal alcohol levels, UDS, troponin, BNP, lactic acid, normal pH on VBG. - Hold off on further antibiotics pending work-up and response - Continue D10 drip with frequent glucose monitoring, can discontinue if glucose becomes significantly elevated - Follow-up TSH - Follow-up urine studies and blood cultures  AKI Hypokalemia > Creatinine of 1.85 from a baseline of 1.2 > K 3.0 in ED, 6 rounds of IV potassium ordered - Received 1 L bolus in ED, patient receiving fluids with her potassium runs - Continue 6 rounds of IV potassium - Avoid nephrotoxic agents - Trend renal function - Follow-up add on magnesium  Anemia > Hemoglobin stable at 11.3 - Trend CBC  Diabetes > On 30 units of daily insulin at home > Hypoglycemic on arrival as above - Continue dextrose drip for  now - Monitor CBGs - Hold home insulin  Hyperlipidemia History of CVA - Holding aspirin, Plavix, and statin tonight - Resume tomorrow if more alert and able to tolerate p.o.  Hypertension - Holding home lisinopril in the setting of AKI and  lethargy   DVT prophylaxis: Lovenox Code Status:   Full  Family Communication:  Son updated at bedside  Disposition Plan:   Patient is from:  Home  Anticipated DC to:  Home  Anticipated DC date:  Pending clinical course  Anticipated DC barriers: None  Consults called:  None  Admission status:  Observation, telemetry   Severity of Illness: The appropriate patient status for this patient is OBSERVATION. Observation status is judged to be reasonable and necessary in order to provide the required intensity of service to ensure the patient's safety. The patient's presenting symptoms, physical exam findings, and initial radiographic and laboratory data in the context of their medical condition is felt to place them at decreased risk for further clinical deterioration. Furthermore, it is anticipated that the patient will be medically stable for discharge from the hospital within 2 midnights of admission. The following factors support the patient status of observation.   " The patient's presenting symptoms include altered mental status, hypoglycemia. " The physical exam findings include lethargy. " The initial radiographic and laboratory data are concerning for AKI with creatinine of 1.85 from baseline of 1.2, stable anemia 11.3, hypoglycemia initially as low as 43.   Marcelyn Bruins MD [facility]  How to contact the [facility] Attending or Consulting provider [place] or covering provider during after hours [place], for this patient?   1. Check the care team in [facility] and look for a) attending/consulting TR[facility] provider listed and b) the [facility] team listed 2. Log into [email or web address] and use [place]'s universal password to access. If you do not have the password, please contact the hospital operator. 3. Locate the [facility] provider you are looking for under [facility] and page to a number that you can be directly reached. 4. If you still have difficulty reaching the provider, please page the [facility]  (Director on Call) for the Hospitalists listed on amion for assistance.  02/17/2020, 12:18 AM

## 2020-02-16 NOTE — ED Notes (Signed)
Bair Hugger applied

## 2020-02-16 NOTE — ED Notes (Signed)
D50 given by Efraim Kaufmann RN

## 2020-02-16 NOTE — ED Notes (Signed)
Pt transported to CT at this time.

## 2020-02-16 NOTE — ED Notes (Signed)
Cultures obtained by lab

## 2020-02-17 ENCOUNTER — Encounter (HOSPITAL_COMMUNITY): Payer: Self-pay | Admitting: Internal Medicine

## 2020-02-17 DIAGNOSIS — Z8673 Personal history of transient ischemic attack (TIA), and cerebral infarction without residual deficits: Secondary | ICD-10-CM

## 2020-02-17 DIAGNOSIS — E876 Hypokalemia: Secondary | ICD-10-CM

## 2020-02-17 DIAGNOSIS — E162 Hypoglycemia, unspecified: Secondary | ICD-10-CM | POA: Diagnosis present

## 2020-02-17 DIAGNOSIS — I10 Essential (primary) hypertension: Secondary | ICD-10-CM

## 2020-02-17 DIAGNOSIS — E11641 Type 2 diabetes mellitus with hypoglycemia with coma: Principal | ICD-10-CM

## 2020-02-17 LAB — BASIC METABOLIC PANEL
Anion gap: 9 (ref 5–15)
BUN: 27 mg/dL — ABNORMAL HIGH (ref 8–23)
CO2: 21 mmol/L — ABNORMAL LOW (ref 22–32)
Calcium: 7.8 mg/dL — ABNORMAL LOW (ref 8.9–10.3)
Chloride: 107 mmol/L (ref 98–111)
Creatinine, Ser: 1.48 mg/dL — ABNORMAL HIGH (ref 0.44–1.00)
GFR, Estimated: 36 mL/min — ABNORMAL LOW (ref >60)
Glucose, Bld: 194 mg/dL — ABNORMAL HIGH (ref 70–99)
Potassium: 3.9 mmol/L (ref 3.5–5.1)
Sodium: 137 mmol/L (ref 135–145)

## 2020-02-17 LAB — CBG MONITORING, ED
Glucose-Capillary: 110 mg/dL — ABNORMAL HIGH (ref 70–99)
Glucose-Capillary: 111 mg/dL — ABNORMAL HIGH (ref 70–99)
Glucose-Capillary: 149 mg/dL — ABNORMAL HIGH (ref 70–99)
Glucose-Capillary: 161 mg/dL — ABNORMAL HIGH (ref 70–99)
Glucose-Capillary: 172 mg/dL — ABNORMAL HIGH (ref 70–99)
Glucose-Capillary: 174 mg/dL — ABNORMAL HIGH (ref 70–99)

## 2020-02-17 LAB — CBC
HCT: 36.1 % (ref 36.0–46.0)
Hemoglobin: 10.7 g/dL — ABNORMAL LOW (ref 12.0–15.0)
MCH: 24.9 pg — ABNORMAL LOW (ref 26.0–34.0)
MCHC: 29.6 g/dL — ABNORMAL LOW (ref 30.0–36.0)
MCV: 84.1 fL (ref 80.0–100.0)
Platelets: 171 10*3/uL (ref 150–400)
RBC: 4.29 MIL/uL (ref 3.87–5.11)
RDW: 15.2 % (ref 11.5–15.5)
WBC: 7 10*3/uL (ref 4.0–10.5)
nRBC: 0 % (ref 0.0–0.2)

## 2020-02-17 LAB — TROPONIN I (HIGH SENSITIVITY): Troponin I (High Sensitivity): 20 ng/L — ABNORMAL HIGH (ref <18)

## 2020-02-17 LAB — I-STAT ARTERIAL BLOOD GAS, ED
Acid-base deficit: 1 mmol/L (ref 0.0–2.0)
Bicarbonate: 24.6 mmol/L (ref 20.0–28.0)
Calcium, Ion: 1.19 mmol/L (ref 1.15–1.40)
HCT: 35 % — ABNORMAL LOW (ref 36.0–46.0)
Hemoglobin: 11.9 g/dL — ABNORMAL LOW (ref 12.0–15.0)
O2 Saturation: 96 %
Patient temperature: 94.1
Potassium: 3.4 mmol/L — ABNORMAL LOW (ref 3.5–5.1)
Sodium: 140 mmol/L (ref 135–145)
TCO2: 26 mmol/L (ref 22–32)
pCO2 arterial: 39 mmHg (ref 32.0–48.0)
pH, Arterial: 7.396 (ref 7.350–7.450)
pO2, Arterial: 70 mmHg — ABNORMAL LOW (ref 83.0–108.0)

## 2020-02-17 LAB — GLUCOSE, CAPILLARY
Glucose-Capillary: 152 mg/dL — ABNORMAL HIGH (ref 70–99)
Glucose-Capillary: 143 mg/dL — ABNORMAL HIGH (ref 70–99)
Glucose-Capillary: 137 mg/dL — ABNORMAL HIGH (ref 70–99)

## 2020-02-17 LAB — RESP PANEL BY RT-PCR (FLU A&B, COVID) ARPGX2
Influenza A by PCR: NEGATIVE
Influenza B by PCR: NEGATIVE
SARS Coronavirus 2 by RT PCR: NEGATIVE

## 2020-02-17 LAB — MAGNESIUM: Magnesium: 1.9 mg/dL (ref 1.7–2.4)

## 2020-02-17 LAB — TSH: TSH: 1.803 u[IU]/mL (ref 0.350–4.500)

## 2020-02-17 MED ORDER — ENOXAPARIN SODIUM 30 MG/0.3ML ~~LOC~~ SOLN
30.0000 mg | Freq: Every day | SUBCUTANEOUS | Status: DC
  Administered 2020-02-17 – 2020-02-18 (×2): 30 mg via SUBCUTANEOUS
  Filled 2020-02-17 (×2): qty 0.3

## 2020-02-17 MED ORDER — CLOPIDOGREL BISULFATE 75 MG PO TABS
75.0000 mg | ORAL_TABLET | Freq: Every day | ORAL | Status: DC
  Administered 2020-02-17 – 2020-02-18 (×2): 75 mg via ORAL
  Filled 2020-02-17 (×2): qty 1

## 2020-02-17 MED ORDER — ATORVASTATIN CALCIUM 80 MG PO TABS
80.0000 mg | ORAL_TABLET | Freq: Every day | ORAL | Status: DC
  Administered 2020-02-17 – 2020-02-18 (×2): 80 mg via ORAL
  Filled 2020-02-17 (×2): qty 1

## 2020-02-17 MED ORDER — ASPIRIN EC 81 MG PO TBEC
325.0000 mg | DELAYED_RELEASE_TABLET | Freq: Every day | ORAL | Status: DC
  Filled 2020-02-17: qty 4

## 2020-02-17 NOTE — ED Notes (Signed)
Attempted to call report

## 2020-02-17 NOTE — ED Notes (Signed)
Lunch Tray Ordered @ 1036. 

## 2020-02-17 NOTE — Evaluation (Signed)
Occupational Therapy Evaluation Patient Details Name: Meghan Ford MRN: [id number] DOB: 1940-10-16 Today's Date: 02/17/2020    History of Present Illness  Meghan Ford is a 79 y.o. female with medical history significant of anemia, CVA, diabetes, hyperlipidemia, hypertension, GI bleed who presents with altered mental status, Acute metabolic encephalopathy, and  Hypoglycemia.    Clinical Impression   Pt pleasant and cooperative. Reports that she lives with a friend and is typically mod I for ADL and transfers with grab bars and a SPC. She states that her children perform IADL. Today she is max A for LB dressing, mod A +2 for transfers off the ED stretcher and BSC. She was able to perform rear peri care in standing after assist for boost into standing. Unsure of cognitive status as no family present and Pt with contradicting information at times during evaluation. Pt will benefit from skilled OT in the acute setting and afterwards at the [facility] level.    Follow Up Recommendations  Home health OT;Supervision/Assistance - 24 hour    Equipment Recommendations  3 in 1 bedside commode    Recommendations for Other Services       Precautions / Restrictions Precautions Precautions: Fall Restrictions Weight Bearing Restrictions: No      Mobility Bed Mobility Overal bed mobility: Needs Assistance Bed Mobility: Supine to Sit;Sit to Supine     Supine to sit: Mod assist;+2 for safety/equipment;+2 for physical assistance Sit to supine: Mod assist;+2 for safety/equipment;+2 for physical assistance   General bed mobility comments: Required assist for trunk and LE management. Also required assist to scoot to EOB.     Transfers Overall transfer level: Needs assistance Equipment used: 2 person hand held assist Transfers: Sit to/from [employer] Sit to Stand: Mod assist;+2 physical assistance Stand pivot transfers: Min assist;+2 physical assistance        General transfer comment: Mod A for lift assist and steadying to stand. Min A for steadying assist to transfer to [facility]. Pt with short steps to [facility] secondary to pain.     Balance Overall balance assessment: Needs assistance Sitting-balance support: No upper extremity supported;Feet supported Sitting balance-Leahy Scale: Fair     Standing balance support: Bilateral upper extremity supported;During functional activity Standing balance-Leahy Scale: Poor Standing balance comment: Reliant on UE and external support                           ADL either performed or assessed with clinical judgement   ADL Overall ADL's : Needs assistance/impaired Eating/Feeding: NPO   Grooming: Min guard;Sitting;Wash/dry face;Wash/dry hands Grooming Details (indicate cue type and reason): on BSC Upper Body Bathing: Minimal assistance;Sitting   Lower Body Bathing: Moderate assistance;+2 for physical assistance;+2 for safety/equipment;Sit to/from stand   Upper Body Dressing : Minimal assistance;Sitting   Lower Body Dressing: Moderate assistance;+2 for physical assistance;+2 for safety/equipment;Sit to/from stand Lower Body Dressing Details (indicate cue type and reason): Pt unable to don socks  - "I have special clothes at home that allow me to dress myself" Toilet Transfer: Moderate assistance;+2 for physical assistance;+2 for safety/equipment;Stand-pivot;BSC   Toileting- Clothing Manipulation and Hygiene: Minimal assistance;+2 for physical assistance;+2 for safety/equipment;Sit to/from stand Toileting - Clothing Manipulation Details (indicate cue type and reason): Pt able to perform rear peri care this session     Functional mobility during ADLs: Minimal assistance;+2 for physical assistance;+2 for safety/equipment (2 person HHA )       Vision  Perception     Praxis      Pertinent Vitals/Pain Pain Assessment: No/denies pain     Hand Dominance Left   Extremity/Trunk  Assessment Upper Extremity Assessment Upper Extremity Assessment: LUE deficits/detail LUE Deficits / Details: Pt reports 3 prior CVA and so baseline numbness all the time LUE Sensation: decreased light touch   Lower Extremity Assessment Lower Extremity Assessment: Defer to PT evaluation   Cervical / Trunk Assessment Cervical / Trunk Assessment: Normal   Communication Communication Communication: Prefers language other than [place];Interpreter utilized Donetta Potts [phone])   Cognition Arousal/Alertness: Awake/alert Behavior During Therapy: WFL for tasks assessed/performed Overall Cognitive Status: No family/caregiver present to determine baseline cognitive functioning                                 General Comments: Pt with safety awareness deficits and STM deficits. No family present to determine baseline.    General Comments  VSS throughout session    Exercises     Shoulder Instructions      Home Living Family/patient expects to be discharged to:: Private residence Living Arrangements: Non-relatives/Friends Available Help at Discharge: Friend(s);Family Type of Home: House Home Access: Level entry     Home Layout: One level     Bathroom Shower/Tub: Producer, television/film/video: Standard     Home Equipment: Grab bars - toilet;Grab bars - tub/shower;Cane - single point   Additional Comments: Pt's children perform IADL for Pt      Prior Functioning/Environment Level of Independence: Independent with assistive device(s)        Comments: Outside of home pt uses cane. Independent with ADLs, but requires assist for IADLs.         OT Problem List: Decreased activity tolerance;Impaired balance (sitting and/or standing);Decreased cognition;Decreased safety awareness;Obesity      OT Treatment/Interventions:      OT Goals(Current goals can be found in the care plan section) Acute Rehab OT Goals Patient Stated Goal: to go home OT Goal Formulation:  With patient Time For Goal Achievement: 03/02/20 Potential to Achieve Goals: Good ADL Goals Pt Will Perform Grooming: with supervision;sitting Pt Will Perform Upper Body Dressing: with set-up;sitting Pt Will Perform Lower Body Dressing: with supervision;sit to/from stand Pt Will Transfer to Toilet: with min guard assist;ambulating Pt Will Perform Toileting - Clothing Manipulation and hygiene: with supervision;sitting/lateral leans  OT Frequency: Min 2X/week   Barriers to D/C:            Co-evaluation PT/OT/SLP Co-Evaluation/Treatment: Yes Reason for Co-Treatment: To address functional/ADL transfers;For patient/therapist safety;Necessary to address cognition/behavior during functional activity PT goals addressed during session: Mobility/safety with mobility;Balance;Strengthening/ROM OT goals addressed during session: ADL's and self-care      AM-PAC OT "6 Clicks" Daily Activity     Outcome Measure Help from another person eating meals?: Total (NPO) Help from another person taking care of personal grooming?: A Little Help from another person toileting, which includes using toliet, bedpan, or urinal?: A Lot Help from another person bathing (including washing, rinsing, drying)?: A Lot Help from another person to put on and taking off regular upper body clothing?: A Little Help from another person to put on and taking off regular lower body clothing?: A Lot 6 Click Score: 13   End of Session Equipment Utilized During Treatment: Gait belt Nurse Communication: Mobility status;Other (comment) (both bowel and bladder released)  Activity Tolerance: Patient tolerated treatment well Patient left: in bed;with  call bell/phone within reach;Other (comment) (on ED stretcher with both rails up)  OT Visit Diagnosis: Unsteadiness on feet (R26.81);Other abnormalities of gait and mobility (R26.89);Muscle weakness (generalized) (M62.81);Other symptoms and signs involving cognitive function                 Time: 5009-3818 OT Time Calculation (min): 30 min Charges:  OT General Charges $OT Visit: 1 Visit OT Evaluation $OT Eval Moderate Complexity: 1 Mod  Nyoka Cowden OTR/L Acute Rehabilitation Services Pager: [phone] Office: [phone]  Meghan Ford 02/17/2020, 2:41 PM

## 2020-02-17 NOTE — Evaluation (Signed)
Physical Therapy Evaluation Patient Details Name: Meghan Ford MRN: [id number] DOB: [birth year] Today's Date: 02/17/2020   History of Present Illness   Meghan Ford is a 79 y.o. female with medical history significant of anemia, CVA, diabetes, hyperlipidemia, hypertension, GI bleed who presents with altered mental status, Acute metabolic encephalopathy, and  Hypoglycemia.   Clinical Impression  Pt admitted secondary to problem above with deficits below. Pt with increased pain in BLE at baseline. Pt requiring mod A +2 to stand and min A +2 to transfer to/from [employer]. Pt with decreased awareness of current deficits. Feel she would benefit from use of RW and HHPT. Will need to ensure appropriate family assist at home. Will continue to follow acutely.     Follow Up Recommendations Home health PT;Supervision for mobility/OOB    Equipment Recommendations  Rolling walker with 5" wheels    Recommendations for Other Services       Precautions / Restrictions Precautions Precautions: Fall Restrictions Weight Bearing Restrictions: No      Mobility  Bed Mobility Overal bed mobility: Needs Assistance Bed Mobility: Supine to Sit;Sit to Supine     Supine to sit: Mod assist;+2 for safety/equipment;+2 for physical assistance Sit to supine: Mod assist;+2 for safety/equipment;+2 for physical assistance   General bed mobility comments: Required assist for trunk and LE management. Also required assist to scoot to EOB.     Transfers Overall transfer level: Needs assistance Equipment used: 2 person hand held assist Transfers: Sit to/from [employer] Sit to Stand: Mod assist;+2 physical assistance Stand pivot transfers: Min assist;+2 physical assistance       General transfer comment: Mod A for lift assist and steadying to stand. Min A for steadying assist to transfer to [facility]. Pt with short steps to [facility] secondary to pain.   Ambulation/Gait                 Stairs            Wheelchair Mobility    Modified Rankin (Stroke Patients Only)       Balance Overall balance assessment: Needs assistance Sitting-balance support: No upper extremity supported;Feet supported Sitting balance-Leahy Scale: Fair     Standing balance support: Bilateral upper extremity supported;During functional activity Standing balance-Leahy Scale: Poor Standing balance comment: Reliant on UE and external support                             Pertinent Vitals/Pain Pain Assessment: No/denies pain    Home Living Family/patient expects to be discharged to:: Private residence Living Arrangements: Non-relatives/Friends Available Help at Discharge: Friend(s) Type of Home: House Home Access: Level entry     Home Layout: One level Home Equipment: Grab bars - toilet;Grab bars - tub/shower;Cane - single point      Prior Function Level of Independence: Independent with assistive device(s)         Comments: Outside of home pt uses cane. Independent with ADLs, but requires assist for IADLs.      Hand Dominance        Extremity/Trunk Assessment   Upper Extremity Assessment Upper Extremity Assessment: Defer to OT evaluation    Lower Extremity Assessment Lower Extremity Assessment: Generalized weakness (reports pain in BLE. )    Cervical / Trunk Assessment Cervical / Trunk Assessment: Normal  Communication   Communication: Prefers language other than [place];Interpreter utilized  Cognition Arousal/Alertness: Awake/alert Behavior During Therapy: WFL for tasks assessed/performed Overall Cognitive Status: No family/caregiver  present to determine baseline cognitive functioning                                 General Comments: Pt with safety awareness deficits and STM deficits. No family present to determine baseline.       General Comments      Exercises     Assessment/Plan    PT Assessment Patient needs  continued PT services  PT Problem List Decreased strength;Decreased balance;Decreased mobility;Decreased cognition;Decreased safety awareness;Decreased knowledge of use of DME;Decreased knowledge of precautions;Pain       PT Treatment Interventions Gait training;DME instruction;Functional mobility training;Therapeutic activities;Therapeutic exercise;Neuromuscular re-education;Balance training;Patient/family education    PT Goals (Current goals can be found in the Care Plan section)  Acute Rehab PT Goals Patient Stated Goal: to go home PT Goal Formulation: With patient Time For Goal Achievement: 03/02/20 Potential to Achieve Goals: Good    Frequency Min 3X/week   Barriers to discharge        Co-evaluation PT/OT/SLP Co-Evaluation/Treatment: Yes Reason for Co-Treatment: To address functional/ADL transfers;For patient/therapist safety PT goals addressed during session: Mobility/safety with mobility;Balance         AM-PAC PT "6 Clicks" Mobility  Outcome Measure Help needed turning from your back to your side while in a flat bed without using bedrails?: A Lot Help needed moving from lying on your back to sitting on the side of a flat bed without using bedrails?: A Lot Help needed moving to and from a bed to a chair (including a wheelchair)?: A Little Help needed standing up from a chair using your arms (e.g., wheelchair or bedside chair)?: A Lot Help needed to walk in hospital room?: A Lot Help needed climbing 3-5 steps with a railing? : A Lot 6 Click Score: 13    End of Session   Activity Tolerance: Patient limited by pain Patient left: in bed;with call bell/phone within reach (on stretcher in ED) Nurse Communication: Mobility status PT Visit Diagnosis: Unsteadiness on feet (R26.81);Muscle weakness (generalized) (M62.81);Difficulty in walking, not elsewhere classified (R26.2)    Time: 4259-5638 PT Time Calculation (min) (ACUTE ONLY): 30 min   Charges:   PT Evaluation $PT  Eval Moderate Complexity: 1 Mod          Farley Ly, PT, DPT  Acute Rehabilitation Services  Pager: [phone] Office: [phone]   Lehman Prom 02/17/2020, 12:55 PM

## 2020-02-17 NOTE — Progress Notes (Signed)
[facility] PROGRESS NOTE   Meghan Ford TIW:[id number] DOB: [birth year] DOA: 02/16/2020  PCP: Hoy Register, MD  Brief History/Interval Summary: 79 y.o. female with medical history significant of anemia, CVA, diabetes, hyperlipidemia, hypertension, GI bleed who presented with altered mental status.    Patient's son mentioned that her blood glucose level was low.  Subsequently patient apparently became unresponsive at home.  EMS was called.  Patient was brought into the ED.  Noted to have a glucose level of 43.  She was very lethargic.  Had to be started on a D10 infusion subsequently.  Hospitalized for further management.    Reason for Visit: Acute metabolic encephalopathy.  Hypoglycemia  Consultants: None  Procedures: None  Antibiotics: Anti-infectives (From admission, onward)   Start     Dose/Rate Route Frequency Ordered Stop   02/16/20 2345  cefTRIAXone (ROCEPHIN) 1 g in sodium chloride 0.9 % 100 mL IVPB        1 g 200 mL/hr over 30 Minutes Intravenous  Once 02/16/20 2332 02/17/20 0247      Subjective/Interval History: Patient noted to be somewhat distracted but awake.  Denies any pain.     Assessment/Plan:  Acute metabolic encephalopathy This is secondary to hypoglycemia.  CT head did not show any acute findings.  Age-related atrophy and chronic small vessel ischemia was noted.  Area of encephalomalacia involving the right parietal occipital lobe was noted.  Patient does not have any focal deficits.  She was also noted to be hypothermic which is also improving.  Continue to monitor.  TSH normal.  UA does not suggest infection.  SARS Cov2 and influenza were negative.  Hypoglycemia in the setting of known history of diabetes Likely secondary to insulin.  Was unresponsive due to severe hypoglycemia.  Requiring D10 infusion.  Continue to monitor.  Taper D10 as glucose levels improved.  No recent HbA1c in our system.  Last one was 6.5 in January.  We will  recheck it here.  Acute kidney injury/hypokalemia Creatinine was 1.85 at admission.  Baseline is around 1.2.  Improving with IV fluids.  Potassium was repleted.  Magnesium 1.9.  Normocytic anemia Mild drop in hemoglobin is dilutional.  No evidence of overt bleeding.  History of CVA/hyperlipidemia Patient on aspirin Plavix and statin at home.  Should be able to resume.  Essential hypertension Holding lisinopril.  Blood pressure reasonably well controlled.  Obesity Estimated body mass index is 37.84 kg/m as calculated from the following:   Height as of 09/24/19: 5\' 5"  (1.651 m).   Weight as of 09/24/19: 103.1 kg.     DVT Prophylaxis: Lovenox Code Status: Full code Family Communication: No family at bedside Disposition Plan: Hopefully return home when improved PT and OT evaluation  Status is: Observation  The patient will require care spanning > 2 midnights and should be moved to inpatient because: Persistent severe electrolyte disturbances, Altered mental status and Inpatient level of care appropriate due to severity of illness  Dispo: The patient is from: Home              Anticipated d/c is to: Home              Anticipated d/c date is: 1 day              Patient currently is not medically stable to d/c.        Medications:  Scheduled: . enoxaparin (LOVENOX) injection  30 mg Subcutaneous Daily   Continuous: . dextrose 50 mL/hr at  02/17/20 0803   PRN:   Objective:  Vital Signs  Vitals:   02/17/20 0830 02/17/20 0845 02/17/20 0900 02/17/20 0917  BP: (!) 133/47 (!) 112/40 (!) 122/50   Pulse:   67   Resp: 20 19 (!) 22   Temp:    98.1 F (36.7 C)  TempSrc:    Rectal  SpO2:   97%     Intake/Output Summary (Last 24 hours) at 02/17/2020 [id number] Last data filed at 02/17/2020 0815 Gross per 24 hour  Intake --  Output 900 ml  Net -900 ml   There were no vitals filed for this visit.  General appearance: Remains distracted.  No distress Resp: Clear to  auscultation bilaterally.  Normal effort Cardio: S1-S2 is normal regular.  No S3-S4.  No rubs murmurs or bruit GI: Abdomen is soft.  Nontender nondistended.  Bowel sounds are present normal.  No masses organomegaly Extremities: No edema.  Full range of motion of lower extremities. Neurologic: Pleasantly confused. no focal neurological deficits.    Lab Results:  Data Reviewed: I have personally reviewed following labs and imaging studies  CBC: Recent Labs  Lab 02/16/20 2033 02/16/20 2059 02/16/20 2101 02/17/20 0319  WBC 9.3  --   --  7.0  NEUTROABS 5.0  --   --   --   HGB 11.3* 12.2 11.6* 10.7*  HCT 36.4 36.0 34.0* 36.1  MCV 82.4  --   --  84.1  PLT 239  --   --  171    Basic Metabolic Panel: Recent Labs  Lab 02/16/20 2033 02/16/20 2059 02/16/20 2101 02/17/20 0103 02/17/20 0319  NA 139 139 139  --  137  K 3.0* 3.0* 3.1*  --  3.9  CL 106 106  --   --  107  CO2 22  --   --   --  21*  GLUCOSE 270* 275*  --   --  194*  BUN 28* 34*  --   --  27*  CREATININE 1.85* 1.70*  --   --  1.48*  CALCIUM 8.0*  --   --   --  7.8*  MG  --   --   --  1.9  --     GFR: CrCl cannot be calculated (Unknown ideal weight.).  Liver Function Tests: Recent Labs  Lab 02/16/20 2033  AST 31  ALT 21  ALKPHOS 74  BILITOT 0.6  PROT 5.2*  ALBUMIN 2.1*     CBG: Recent Labs  Lab 02/16/20 2033 02/16/20 2131 02/16/20 2257 02/17/20 0049 02/17/20 0211  GLUCAP 169* 83 132* 149* 161*    Thyroid Function Tests: Recent Labs    02/17/20 0103  TSH 1.803     Recent Results (from the past 240 hour(s))  Culture, blood (routine x 2)     Status: None (Preliminary result)   Collection Time: 02/16/20 10:05 PM   Specimen: BLOOD RIGHT ARM  Result Value Ref Range Status   Specimen Description BLOOD RIGHT ARM  Final   Special Requests   Final    BOTTLES DRAWN AEROBIC ONLY Blood Culture adequate volume   Culture   Final    NO GROWTH < 12 HOURS Performed at [facility], [address] [place], [place] [address]    Report Status PENDING  Incomplete  Culture, blood (routine x 2)     Status: None (Preliminary result)   Collection Time: 02/16/20 10:05 PM   Specimen: BLOOD LEFT ARM  Result Value  Ref Range Status   Specimen Description BLOOD LEFT ARM  Final   Special Requests   Final    BOTTLES DRAWN AEROBIC ONLY Blood Culture results may not be optimal due to an excessive volume of blood received in culture bottles   Culture   Final    NO GROWTH < 12 HOURS Performed at [facility], [address] [place], [place] [address]    Report Status PENDING  Incomplete  Resp Panel by RT-PCR (Flu A&B, Covid) Nasopharyngeal Swab     Status: None   Collection Time: 02/17/20  2:19 AM   Specimen: Nasopharyngeal Swab; Nasopharyngeal(NP) swabs in vial transport medium  Result Value Ref Range Status   SARS Coronavirus 2 by RT PCR NEGATIVE NEGATIVE Final    Comment: (NOTE) SARS-CoV-2 target nucleic acids are NOT DETECTED.  The SARS-CoV-2 RNA is generally detectable in upper respiratory specimens during the acute phase of infection. The lowest concentration of SARS-CoV-2 viral copies this assay can detect is 138 copies/mL. A negative result does not preclude SARS-Cov-2 infection and should not be used as the sole basis for treatment or other patient management decisions. A negative result may occur with  improper specimen collection/handling, submission of specimen other than nasopharyngeal swab, presence of viral mutation(s) within the areas targeted by this assay, and inadequate number of viral copies(<138 copies/mL). A negative result must be combined with clinical observations, patient history, and epidemiological information. The expected result is Negative.  Fact Sheet for Patients:  BloggerCourse.com  Fact Sheet for Healthcare Providers:  SeriousBroker.it  This test is no t yet approved or cleared by the [place] FDA and  has been authorized for detection and/or diagnosis of SARS-CoV-2 by FDA under an Emergency Use Authorization (EUA). This EUA will remain  in effect (meaning this test can be used) for the duration of the COVID-19 declaration under Section 564(b)(1) of the Act, 21 U.S.C.section 360bbb-3(b)(1), unless the authorization is terminated  or revoked sooner.       Influenza A by PCR NEGATIVE NEGATIVE Final   Influenza B by PCR NEGATIVE NEGATIVE Final    Comment: (NOTE) The Xpert Xpress SARS-CoV-2/FLU/RSV plus assay is intended as an aid in the diagnosis of influenza from Nasopharyngeal swab specimens and should not be used as a sole basis for treatment. Nasal washings and aspirates are unacceptable for Xpert Xpress SARS-CoV-2/FLU/RSV testing.  Fact Sheet for Patients: BloggerCourse.com  Fact Sheet for Healthcare Providers: SeriousBroker.it  This test is not yet approved or cleared by the [place] FDA and has been authorized for detection and/or diagnosis of SARS-CoV-2 by FDA under an Emergency Use Authorization (EUA). This EUA will remain in effect (meaning this test can be used) for the duration of the COVID-19 declaration under Section 564(b)(1) of the Act, 21 U.S.C. section 360bbb-3(b)(1), unless the authorization is terminated or revoked.  Performed at [facility], [address] [place], [place] [address]       Radiology Studies: CT HEAD WO CONTRAST  Result Date: 02/16/2020 CLINICAL DATA:  Mental status change EXAM: CT HEAD WITHOUT CONTRAST TECHNIQUE: Contiguous axial images were obtained from the base of the skull through the vertex without intravenous contrast. COMPARISON:  May 08, 2017. FINDINGS: Brain: No evidence of acute territorial infarction, hemorrhage, hydrocephalus,extra-axial collection or mass lesion/mass effect. There is dilatation the ventricles and sulci consistent with  age-related atrophy. Low-attenuation changes in the deep white matter consistent with small vessel ischemia. Areas of prior encephalomalacia involving the posterior  right parietooccipital lobe. Vascular: No hyperdense vessel or unexpected calcification. Skull: The skull is intact. No fracture or focal lesion identified. Sinuses/Orbits: The visualized paranasal sinuses and mastoid air cells are clear. The orbits and globes intact. Other: None IMPRESSION: No acute intracranial abnormality. Findings consistent with age related atrophy and chronic small vessel ischemia Area of encephalomalacia involving the right parieto-occipital lobe. Electronically Signed   By: Jonna Clark M.D.   On: 02/16/2020 21:28   DG Chest Port 1 View  Result Date: 02/16/2020 CLINICAL DATA:  Change in mental status EXAM: PORTABLE CHEST 1 VIEW COMPARISON:  None. FINDINGS: The heart size and mediastinal contours are within normal limits. Aortic knob calcifications are seen. Both lungs are clear. 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 Degenerative changes in the midthoracic spine. IMPRESSION: No active disease. Electronically Signed   By: Jonna Clark M.D.   On: 02/16/2020 21:04       LOS: 0 days   Kiaya Haliburton Rito Ehrlich  [facility] Pager on [email or web address]  02/17/2020, 9:39 AM

## 2020-02-17 NOTE — Progress Notes (Signed)
Patient taken off Dextrose 10% IV drip as of 02/17/20 1630 due to infiltration and per DC order by MD. No new IV placed per MD at this time if pt maintains adequate PO fluids.    Per MD, CBG checks to remain Q2 hrs until blood sugar levels are greater than 100 for 3 consecutive days off of D10.

## 2020-02-17 NOTE — Progress Notes (Signed)
Handoff report received from [place], RN at [phone]. Patient arrived on unit 3W at 69. Both IV's found to be infiltrated and all fluids stopped. New IV will be placed.

## 2020-02-17 NOTE — ED Notes (Signed)
Patient up and awaking. Patient had large BM.

## 2020-02-17 NOTE — ED Notes (Signed)
Pt given turkey sandwich bag 

## 2020-02-17 NOTE — ED Notes (Signed)
RN notified about vitals 

## 2020-02-18 DIAGNOSIS — E162 Hypoglycemia, unspecified: Secondary | ICD-10-CM

## 2020-02-18 LAB — GLUCOSE, CAPILLARY
Glucose-Capillary: 104 mg/dL — ABNORMAL HIGH (ref 70–99)
Glucose-Capillary: 97 mg/dL (ref 70–99)
Glucose-Capillary: 111 mg/dL — ABNORMAL HIGH (ref 70–99)

## 2020-02-18 LAB — BASIC METABOLIC PANEL
Anion gap: 8 (ref 5–15)
BUN: 23 mg/dL (ref 8–23)
CO2: 23 mmol/L (ref 22–32)
Calcium: 8.1 mg/dL — ABNORMAL LOW (ref 8.9–10.3)
Chloride: 110 mmol/L (ref 98–111)
Creatinine, Ser: 1.35 mg/dL — ABNORMAL HIGH (ref 0.44–1.00)
GFR, Estimated: 40 mL/min — ABNORMAL LOW (ref >60)
Glucose, Bld: 119 mg/dL — ABNORMAL HIGH (ref 70–99)
Potassium: 3.8 mmol/L (ref 3.5–5.1)
Sodium: 141 mmol/L (ref 135–145)

## 2020-02-18 LAB — URINE CULTURE: Culture: NO GROWTH

## 2020-02-18 LAB — CBC
HCT: 36.2 % (ref 36.0–46.0)
Hemoglobin: 11.1 g/dL — ABNORMAL LOW (ref 12.0–15.0)
MCH: 25.1 pg — ABNORMAL LOW (ref 26.0–34.0)
MCHC: 30.7 g/dL (ref 30.0–36.0)
MCV: 81.7 fL (ref 80.0–100.0)
Platelets: 228 10*3/uL (ref 150–400)
RBC: 4.43 MIL/uL (ref 3.87–5.11)
RDW: 15.2 % (ref 11.5–15.5)
WBC: 6.1 10*3/uL (ref 4.0–10.5)
nRBC: 0 % (ref 0.0–0.2)

## 2020-02-18 LAB — HEMOGLOBIN A1C
Hgb A1c MFr Bld: 7.3 % — ABNORMAL HIGH (ref 4.8–5.6)
Mean Plasma Glucose: 162.81 mg/dL

## 2020-02-18 NOTE — Discharge Instructions (Signed)
Hipoglucemia Hypoglycemia La hipoglucemia ocurre cuando el nivel de azcar (glucosa) en la sangre es demasiado bajo. La hipoglucemia puede suceder en personas con o sin diabetes. Puede desarrollarse rpidamente y ser una emergencia mdica. En [place] con diabetes, un nivel de glucemia inferior a 70 mg/dl (3,46mol/l) se considera hipoglucemia. La glucosa es un tipo de azcar que constituye la principal fuente de energa del cuerpo. Determinadas hormonas, como la insulina y eSecretary/administrator cProbation officerel nivel de glucosa en la sFontanelle La insulina baja la glucemia, mientras que el glucagn la aBeaulieu Si tiene demasiada insulina en el torrente sanguneo o si no ingiere la cantidad suficiente de alimentos que contengan glucosa, puede tener hipoglucemia. Adems, puede tener hipoglucemia reactiva, que ocurre en el lapso de 4 horas despus de haber comido. Cules son las causas? La hipoglucemia ocurre con ms frecuencia en las personas que tienen diabetes y puede deberse a lo siguiente:  Los medicamentos para la diabetes.  No comer lo suficiente o no comer con la frecuencia suficiente.  Un aumento de la actividad fsica.  Beber alcohol con el estmago vaco. Si no tiene diabetes, las causas de la hipoglucemia pueden deberse a lo siguiente:  Un tumor en el pncreas.  No comer lo suficiente o no comer durante perodos largos (ayunar).  Una infeccin o enfermedad grave.  Ciertos medicamentos. Qu incrementa el riesgo? Es ms probable que la hipoglucemia se mBuilding services engineerlas siguientes personas:  Las que tienen diabetes y toman medicamentos para bajar la glucemia.  Las que consumen alcohol en exceso.  Las que tienen una enfermedad grave. Cules son los signos o los sntomas? Sntomas leves Es posible que la hipoglucemia leve no cause sntomas. Si tiene sntomas, pueden incluir los siguientes:  [place]  Ansiedad.  Sudoracin y piel hmeda.  Mareos o sensacin de  desvanecimiento.  Somnolencia.  Nuseas.  Aumento de la frecuencia cardaca.  Dolor de cN[place]  Visin borrosa.  Irritabilidad.  Hormigueo o adormecimiento alrededor de la boca, labios o lengua.  Cambios en la coordinacin.  Sueo agitado. Sntomas moderados La hipoglucemia moderada puede causar los siguientes sntomas:  Confusin mental y deterioro del sentido de la realidad.  Cambios en el comportamiento.  Debilidad.  Latidos cardacos irregulares. Sntomas graves La hipoglucemia grave es una emergencia mdica. Puede ocasionar:  Desmayos.  Convulsiones.  Prdida de la conciencia (estado de coma).  Muerte. Cmo se diagnostica? La hipoglucemia se diagnostica con un anlisis de sangre para medir la glucemia. Este anlisis de sangre se realiza cuando tiene sntomas. El mdico tambin puede hacerle un examen fsico y revisar sus antecedentes mdicos. Cmo se trata? A menudo, el tratamiento de esta afeccin consiste en ingerir de inmediato un alimento o una bebida que contenga azcar, por ejemplo:  De 4 a 6onzas (de 120 a 1564m de jugo de frutas.  De 4 a 6onzas (de 120 a 15011mde refresco comn (no diettico).  4 onzas (120m71me lechUSG Corporationarios caramelos duros.  1 cucharada (15ml12m miel o azcar. Tratamiento de la hipoglucemia en las personas con diabetes Si est alerta y puede tragar de manerGeographical information systems officerra, siga la regla 15/15, que consiste en lo siguiente:  Consuma 15gramos de hidratos de carbono de accin rpida. Hable con su mdico acerca de cunto debera consumir.  Las opciones de accin rpida incluyen: ? Pastillas de glucosa (consuma 15 gramos). ? De 6 a 8unidades de caramelos duros. ? De 4 a 6onzas (de 120 a 150ml)88mjugo de frutas. ?  De 4 a 6onzas (de 120 a 115m) de refresco comn (no diettico). ? 1 cucharada (139m de miel o azcar.  Contrlese la glucemia 1522mtos despus de ingerir el hidrato de carbono.  Si este  nuevo nivel de glucemia todava es igual o menor que 37m10m (3,9mmo81m), ingiera nuevamente 15gramos de un hidrato de carbono.  Si el nivel de glucemia no supera los 37mg/54m3,9mmol/66mdespus de 3intentos, solicite ayuda de inmediato.  Ingiera una comida o una colacin en el transcurso de 1hora despus de que su nivel de glucemia se haya normalizado.  Tratamiento de la hipoglucemia grave La hipoglucemia se considera grave cuando su nivel de glucemia es igual o menor que 54mg/dl60mmol/l)24ma hipoglucemia grave es una emergEngineering geologiste atencin mdica de inmediato. Si tiene hipoglucemia grave y no puede ingerir ningn alimento ni bebida, tal vez deba aplicarse una inyeccin de glucagn. Un familiar o un amigo deben aprender a controlarle el nivel de glucemia y a aplicarle una inyeccin de glucagn. Pregntele al mdico si debera tener disponible un kit de inyecciones de glucagn de emergenciFreight forwarderble que la hipoglucemia grave deba tratarse en un hospital. El tratamiento puede incluir la administracin de glucosa a travs de una va intravenosa. Tambin puede necesitar un tratamiento para tratar la afeccin que est causando la hipoglucemia. Siga estas indicaciones en su casa:  Indicaciones generales  Tome los [employer] libre y los recetados solamente como se lo haya indicado el mdico.  Contrlese la glucemia como se lo haya indicado el mdico.  Limite el consumo de alcohol a no ms de 1medida p75mda si es mujer y no est embarazadaWest Pointas p43mda si es hombre. Una medida equivale a 12onzas de cerveza (355ml), 5onz61me vino (148ml) o 1onz58me bebidas alcohlicas de alta graduacin (44ml).  Concu31ma todas las visitas de control como se lo haya indicado el mdico. Esto es importante. Si usted tiene diabetes:  Lleve siempre con usted un refrigerio que contenga hidratos de carbono de accin rpida para tratar la glucemia baja.  Siga su  plan de control de la diabetes como le hayan indicado. Asegrese de hacer lo siguiente: ? Conozca los sntomas de la hipoglucemia. Es importante tratarla de inmediato para evitar que empeore. ? Tome los medicCampbell Soupo. ? Siga su plan de ejercicio. ? Siga su plan de comidas. Coma a horario y no se saltee ninguna comida. ? Controle su nivel de glucemia con la frecuencia que le hayan indicado. Contrlelo siempre antes y despus de hacer ejerciciEngineer, sitean para los das de enfermedad cuando no pueda comer ni beber normalmente. Elabore este plan por adelantado con el mdico.  ComparHardyntrol de la diabetes con sus compaeros de trabajo y de la escuela, y Cytogeneticistrsonas con las que convive.  ContOsceolaas cetonas en la orina cuando est enfermo y como se lo hayHobokendicado el mdico.  Lleve una tarjeta de alerta mdica o use un brazalete o medalla de alerta mdica. Comunquese con un mdico si:  Tiene problemas para mantener la glucemia dentro del rango ideal.  Tiene episodios frecuentes de hipoglucemia. Solicite ayuda inmediatamente si:  Contina teniendo sntomas de hipoglucemia despus de haber ingerido un alimento o una bebida que contengan glucosa.  Su nivel de glucemia es igual o menor que 54mg/dl (3mmol73m  Tien41mna convulsin.  Se desmaya. Estos sntomas pueden representar un problema grave que constituye una emergencia. Engineer, maintenance (IT)  ver si los sntomas desaparecen. Solicite atencin mdica de inmediato. Comunquese con el servicio de emergencias de su localidad (911 en los [place]). Resumen  La hipoglucemia ocurre cuando el nivel de azcar (glucosa) en la sangre es demasiado bajo.  La hipoglucemia puede suceder en personas con o sin diabetes. Puede desarrollarse rpidamente y ser una emergencia mdica.  Asegrese de [employer] sntomas de la hipoglucemia y cmo tratarla.  Lleve siempre con usted un refrigerio que contenga  hidratos de carbono de accin rpida para tratar el nivel de azcar en la sangre bajo. Esta informacin no tiene Marine scientist el consejo del mdico. Asegrese de hacerle al mdico cualquier pregunta que tenga. Document Revised: 10/09/2017 Document Reviewed: 04/02/2015 Elsevier Patient Education  2020 [employer].

## 2020-02-18 NOTE — Discharge Summary (Signed)
[facility]  Physician Discharge Summary   Patient ID: Meghan Ford MRN: [id number] DOB/AGE: 1940/07/14 79 y.o.  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 Patient being asked to not take her insulin for now and maintain record of her CBG checks 3 times a day 2. Patient to follow-up with PCP in 1 week    Home Health: Patient declines home health Equipment/Devices: None  CODE STATUS: Full code  DISCHARGE CONDITION: fair  Diet recommendation:.  Regular  INITIAL HISTORY: 79 y.o.femalewith medical history significant ofanemia, CVA, diabetes, hyperlipidemia, hypertension, GI bleed who presented with altered mental status.   Patient's son mentioned that her blood glucose level was low.  Subsequently patient apparently became unresponsive at home.  EMS was called.  Patient was brought into the ED.  Noted to have a glucose level of 43.  She was very lethargic.  Had to be started on a D10 infusion subsequently.  Hospitalized for further management.     HOSPITAL COURSE:   Acute metabolic encephalopathy This is secondary to hypoglycemia.  CT head did not show any acute findings.  Age-related atrophy and chronic small vessel ischemia was noted.  Area of encephalomalacia involving the right parietal occipital lobe was noted.  Patient does not have any focal deficits.  She was also noted to be hypothermic which is also improving.   TSH normal.  UA does not suggest infection.  SARS Cov2 and influenza were negative.  Seems to be back to baseline now.  Hypoglycemia in the setting of known history of  diabetes Likely secondary to insulin.  Was unresponsive due to severe hypoglycemia.  Requiring D10 infusion.    Now off of D10 infusion since last night.  Oral intake has improved.  CBGs are greater than 100.  Reason for her hyperglycemia is not entirely clear.  Son does report that patient has had very poor appetite for the past several weeks.  Reason for this is not clear.  Recommend holding insulin for now and checking CBGs 3 times a day before meals and maintain a record for PCP.  To see PCP next week.  If CBGs consistently run greater than 200 then can start Lantus at 5 units/day.   Acute kidney injury.  In the setting of chronic kidney disease stage IIIa/hypokalemia Creatinine was 1.85 at admission.    Improved with hydration.  Back to baseline.    Normocytic anemia Mild drop in hemoglobin is dilutional.  No evidence of overt bleeding.  History of CVA/hyperlipidemia Continue antiplatelet agents as well as statin.  Essential hypertension Resume home medication  Obesity Estimated body mass index is 37.84 kg/m as calculated from the following:   Height as of 09/24/19: $RemoveBef'5\' 5"'HqUZJzoCDB$  (1.651 m).   Weight as of 09/24/19: 103.1 kg.    Overall stable.  Okay for discharge home today.  Discussed with patient and her son.  Used interpreter to discuss with patient.   PERTINENT LABS:  The results of significant diagnostics from this hospitalization (including imaging, microbiology, ancillary and laboratory) are listed below for reference.    Microbiology: Recent Results (from the past 240 hour(s))  Urine culture     Status: None   Collection Time: 02/16/20  9:35 PM  Specimen: Urine, Catheterized  Result Value Ref Range Status   Specimen Description URINE, CATHETERIZED  Final   Special Requests NONE  Final   Culture   Final    NO GROWTH Performed at [facility], [address] [place], [place] [address]    Report Status 02/18/2020 FINAL  Final  Culture, blood (routine x 2)      Status: None (Preliminary result)   Collection Time: 02/16/20 10:05 PM   Specimen: BLOOD RIGHT ARM  Result Value Ref Range Status   Specimen Description BLOOD RIGHT ARM  Final   Special Requests   Final    BOTTLES DRAWN AEROBIC ONLY Blood Culture adequate volume   Culture   Final    NO GROWTH 2 DAYS Performed at [facility], [place] [address], [place], [place] [address]    Report Status PENDING  Incomplete  Culture, blood (routine x 2)     Status: None (Preliminary result)   Collection Time: 02/16/20 10:05 PM   Specimen: BLOOD LEFT ARM  Result Value Ref Range Status   Specimen Description BLOOD LEFT ARM  Final   Special Requests   Final    BOTTLES DRAWN AEROBIC ONLY Blood Culture results may not be optimal due to an excessive volume of blood received in culture bottles   Culture   Final    NO GROWTH 2 DAYS Performed at [facility], [place] [address], [place], [place] [address]    Report Status PENDING  Incomplete  Resp Panel by RT-PCR (Flu A&B, Covid) Nasopharyngeal Swab     Status: None   Collection Time: 02/17/20  2:19 AM   Specimen: Nasopharyngeal Swab; Nasopharyngeal(NP) swabs in vial transport medium  Result Value Ref Range Status   SARS Coronavirus 2 by RT PCR NEGATIVE NEGATIVE Final    Comment: (NOTE) SARS-CoV-2 target nucleic acids are NOT DETECTED.  The SARS-CoV-2 RNA is generally detectable in upper respiratory specimens during the acute phase of infection. The lowest concentration of SARS-CoV-2 viral copies this assay can detect is 138 copies/mL. A negative result does not preclude SARS-Cov-2 infection and should not be used as the sole basis for treatment or other patient management decisions. A negative result may occur with  improper specimen collection/handling, submission of specimen other than nasopharyngeal swab, presence of viral mutation(s) within the areas targeted by this assay, and inadequate number of viral copies(<138 copies/mL). A negative  result must be combined with clinical observations, patient history, and epidemiological information. The expected result is Negative.  Fact Sheet for Patients:  EntrepreneurPulse.com.au  Fact Sheet for Healthcare Providers:  IncredibleEmployment.be  This test is no t yet approved or cleared by the [place] FDA and  has been authorized for detection and/or diagnosis of SARS-CoV-2 by FDA under an Emergency Use Authorization (EUA). This EUA will remain  in effect (meaning this test can be used) for the duration of the COVID-19 declaration under Section 564(b)(1) of the Act, 21 U.S.C.section 360bbb-3(b)(1), unless the authorization is terminated  or revoked sooner.       Influenza A by PCR NEGATIVE NEGATIVE Final   Influenza B by PCR NEGATIVE NEGATIVE Final    Comment: (NOTE) The Xpert Xpress SARS-CoV-2/FLU/RSV plus assay is intended as an aid in the diagnosis of influenza from Nasopharyngeal swab specimens and should not be used as a sole basis for treatment. Nasal washings and aspirates are unacceptable for Xpert Xpress SARS-CoV-2/FLU/RSV testing.  Fact Sheet for Patients: EntrepreneurPulse.com.au  Fact Sheet for  Healthcare Providers: IncredibleEmployment.be  This test is not yet approved or cleared by the [place] and has been authorized for detection and/or diagnosis of SARS-CoV-2 by FDA under an Emergency Use Authorization (EUA). This EUA will remain in effect (meaning this test can be used) for the duration of the COVID-19 declaration under Section 564(b)(1) of the Act, 21 U.S.C. section 360bbb-3(b)(1), unless the authorization is terminated or revoked.  Performed at [facility], [place] [address], [place], [place] [address]      Labs:     Basic Metabolic Panel: Recent Labs  Lab 02/16/20 2033 02/16/20 2033 02/16/20 2059 02/16/20 2101 02/16/20 2208 02/17/20 0103  02/17/20 0319 02/18/20 0126  NA 139   < > 139 139 140  --  137 141  K 3.0*   < > 3.0* 3.1* 3.4*  --  3.9 3.8  CL 106  --  106  --   --   --  107 110  CO2 22  --   --   --   --   --  21* 23  GLUCOSE 270*  --  275*  --   --   --  194* 119*  BUN 28*  --  34*  --   --   --  27* 23  CREATININE 1.85*  --  1.70*  --   --   --  1.48* 1.35*  CALCIUM 8.0*  --   --   --   --   --  7.8* 8.1*  MG  --   --   --   --   --  1.9  --   --    < > = values in this interval not displayed.   Liver Function Tests: Recent Labs  Lab 02/16/20 2033  AST 31  ALT 21  ALKPHOS 74  BILITOT 0.6  PROT 5.2*  ALBUMIN 2.1*   CBC: Recent Labs  Lab 02/16/20 2033 02/16/20 2033 02/16/20 2059 02/16/20 2101 02/16/20 2208 02/17/20 0319 02/18/20 0126  WBC 9.3  --   --   --   --  7.0 6.1  NEUTROABS 5.0  --   --   --   --   --   --   HGB 11.3*   < > 12.2 11.6* 11.9* 10.7* 11.1*  HCT 36.4   < > 36.0 34.0* 35.0* 36.1 36.2  MCV 82.4  --   --   --   --  84.1 81.7  PLT 239  --   --   --   --  171 228   < > = values in this interval not displayed.   BNP: BNP (last 3 results) Recent Labs    02/16/20 2033  BNP 47.9     CBG: Recent Labs  Lab 02/17/20 2036 02/17/20 2318 02/18/20 0318 02/18/20 0800 02/18/20 1213  GLUCAP 143* 137* 104* 97 111*     IMAGING STUDIES CT HEAD WO CONTRAST  Result Date: 02/16/2020 CLINICAL DATA:  Mental status change EXAM: CT HEAD WITHOUT CONTRAST TECHNIQUE: Contiguous axial images were obtained from the base of the skull through the vertex without intravenous contrast. COMPARISON:  May 08, 2017. FINDINGS: Brain: No evidence of acute territorial infarction, hemorrhage, hydrocephalus,extra-axial collection or mass lesion/mass effect. There is dilatation the ventricles and sulci consistent with age-related atrophy. Low-attenuation changes in the deep white matter consistent with small vessel ischemia. Areas of prior encephalomalacia involving the posterior right parietooccipital  lobe. Vascular: No hyperdense vessel or unexpected calcification.  Skull: The skull is intact. No fracture or focal lesion identified. Sinuses/Orbits: The visualized paranasal sinuses and mastoid air cells are clear. The orbits and globes intact. Other: None IMPRESSION: No acute intracranial abnormality. Findings consistent with age related atrophy and chronic small vessel ischemia Area of encephalomalacia involving the right parieto-occipital lobe. Electronically Signed   By: Prudencio Pair M.D.   On: 02/16/2020 21:28   DG Chest Port 1 View  Result Date: 02/16/2020 CLINICAL DATA:  Change in mental status EXAM: PORTABLE CHEST 1 VIEW COMPARISON:  None. FINDINGS: The heart size and mediastinal contours are within normal limits. Aortic knob calcifications are seen. Both lungs are clear. 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 Degenerative changes in the midthoracic spine. IMPRESSION: No active disease. Electronically Signed   By: Prudencio Pair M.D.   On: 02/16/2020 21:04    DISCHARGE EXAMINATION: Vitals:   02/17/20 2316 02/18/20 0321 02/18/20 0756 02/18/20 1212  BP: (!) 187/63 (!) 189/57 (!) 158/70 (!) 165/76  Pulse: 69 68 63 69  Resp: $Remo'18 18 18 16  'araQx$ Temp: 98.3 F (36.8 C) 98 F (36.7 C) 98.7 F (37.1 C) 98 F (36.7 C)  TempSrc: Oral Oral Oral Oral  SpO2: 98% 98% 99% 99%   General appearance: Awake alert.  In no distress Resp: Clear to auscultation bilaterally.  Normal effort Cardio: S1-S2 is normal regular.  No S3-S4.  No rubs murmurs or bruit GI: Abdomen is soft.  Nontender nondistended.  Bowel sounds are present normal.  No masses organomegaly    DISPOSITION: Home  Discharge Instructions    Call MD for:  difficulty breathing, headache or visual disturbances   Complete by: As directed    Call MD for:  extreme fatigue   Complete by: As directed    Call MD for:  persistant dizziness or light-headedness   Complete by: As directed    Call MD for:  persistant nausea and vomiting   Complete  by: As directed    Call MD for:  severe uncontrolled pain   Complete by: As directed    Call MD for:  temperature >100.4   Complete by: As directed    Diet - low sodium heart healthy   Complete by: As directed    Discharge instructions   Complete by: As directed    Please do not take your insulin for now.  Check your CBGs before each meal for the next 1 week.  Contact your primary care provider if your CBGs are greater than 200 as you may need to resume taking your insulin.  If you are unable to reach your PCP and if your CBGs are greater than 200 then you can start taking your Lantus at 5 units once a day.  Talk to your primary care provider about your poor appetite.  You were cared for by a hospitalist during your hospital stay. If you have any questions about your discharge medications or the care you received while you were in the hospital after you are discharged, you can call the unit and asked to speak with the hospitalist on call if the hospitalist that took care of you is not available. Once you are discharged, your primary care physician will handle any further medical issues. Please note that NO REFILLS for any discharge medications will be authorized once you are discharged, as it is imperative that you return to your primary care physician (or establish a relationship with a primary care physician if you do not have one) for your aftercare  needs so that they can reassess your need for medications and monitor your lab values. If you do not have a primary care physician, you can call [phone] for a physician referral.   Increase activity slowly   Complete by: As directed         Allergies as of 02/18/2020   No Known Allergies     Medication List    STOP taking these medications   insulin glargine 100 UNIT/ML injection Commonly known as: Lantus     TAKE these medications   aspirin 81 MG tablet Take 4 tablets (325 mg total) by mouth daily. Start 03/31/2019   atorvastatin 80  MG tablet Commonly known as: LIPITOR Take 1 tablet (80 mg total) by mouth daily.   blood glucose meter kit and supplies Kit Dispense based on patient and insurance preference. Use up to four times daily as directed. (FOR ICD-9 250.00, 250.01).   clopidogrel 75 MG tablet Commonly known as: PLAVIX Take 1 tablet (75 mg total) by mouth daily. Start taking Plavix on 03/31/2019.   gabapentin 300 MG capsule Commonly known as: NEURONTIN Take 1 capsule (300 mg total) by mouth at bedtime.   glucose blood test strip Use as instructed   True Metrix Blood Glucose Test test strip Generic drug: glucose blood Use 3 times daily before meals   Insulin Syringes (Disposable) U-100 0.3 ML Misc USE AS DIRECTED   isosorbide mononitrate 60 MG 24 hr tablet Commonly known as: IMDUR Take 1 tablet (60 mg total) by mouth daily.   lisinopril 20 MG tablet Commonly known as: ZESTRIL Take 1 tablet (20 mg total) by mouth daily.   naproxen 500 MG tablet Commonly known as: Naprosyn Take 1 tablet (500 mg total) by mouth 2 (two) times daily with a meal.   senna-docusate 8.6-50 MG tablet Commonly known as: Senokot-S Take 1 tablet by mouth at bedtime as needed for mild constipation.   True Metrix Meter Devi 1 each by Does not apply route 3 (three) times daily before meals.   TRUEplus Insulin Syringe 31G X 5/16" 0.5 ML Misc Generic drug: Insulin Syringe-Needle U-100 USE AS DIRECTED.   TRUEplus Lancets 28G Misc 1 each by Does not apply route 3 (three) times daily before meals.         Follow-up Information    Charlott Rakes, MD. Schedule an appointment as soon as possible for a visit in 1 week(s).   Specialty: Family Medicine Contact information: [place] [address] [phone]               TOTAL DISCHARGE TIME: 44 minutes  [place]  [facility] Pager on [email or web address]  02/18/2020, 12:49 PM

## 2020-02-18 NOTE — Plan of Care (Signed)

## 2020-02-18 NOTE — Plan of Care (Signed)
Adequate for discharge.

## 2020-02-18 NOTE — Progress Notes (Addendum)
Occupational Therapy Treatment Patient Details Name: Meghan Ford MRN: [id number] DOB: 06/01/1940 Today's Date: 02/18/2020    History of present illness  Meghan Ford is a 79 y.o. female with medical history significant of anemia, CVA, diabetes, hyperlipidemia, hypertension, GI bleed who presents with altered mental status, Acute metabolic encephalopathy, and  Hypoglycemia.    OT comments  Spanish interpreter Ashby Dawes present throughout treatment session. OT treatment session with focus on self-care re-education, bed mobility, ADL transfers, and short-distance functional mobility in prep for ADLs. Patient states that upon initial eval her blood surgar was low and caused increased confusion. Patient states that she feels much better now although still weak. Patient able to transition from supine to EOB with Mod I and use of bed rail, sit to stand from EOB with Min guard and walk to bathroom with use of SPC and Min guard for steadying. Noted furniture surfing. Patient would benefit from continued acute OT services to maximize safety and independence with self-care tasks in prep for d/c home with recommendation for HHOT. Patient notes receiving HH therapies in the past but was unable to maximize services as she does not have family that speak English to interpretate. Patient currently declining [facility] services because of this. If patient is to d/c home, recommendation for 24hr supervision/assist for safety given increased fall risk 2/2 generalized weakness and unsteadiness with functional mobility.    Follow Up Recommendations  Home health OT;Supervision/Assistance - 24 hour    Equipment Recommendations  3 in 1 bedside commode    Recommendations for Other Services      Precautions / Restrictions Precautions Precautions: Fall Restrictions Weight Bearing Restrictions: No       Mobility Bed Mobility   Bed Mobility: Supine to Sit;Sit to Supine     Supine to sit:  Modified independent (Device/Increase time) Sit to supine: Modified independent (Device/Increase time)   General bed mobility comments: Mod I for supine<>EOB with use of bed rail. HOB slightly elevated.   Transfers Overall transfer level: Needs assistance Equipment used: Straight cane Transfers: Sit to/from [employer] Sit to Stand: Min guard Stand pivot transfers: Min guard       General transfer comment: Min guard for sit to stand from EOB positioned in lowest setting and from commode in bathroom. Min guard for SPT to commode.     Balance Overall balance assessment: Needs assistance Sitting-balance support: No upper extremity supported;Feet supported Sitting balance-Leahy Scale: Good     Standing balance support: Single extremity supported Standing balance-Leahy Scale: Fair Standing balance comment: Reliant on UE support. Patient furniture surfs.                            ADL either performed or assessed with clinical judgement   ADL Overall ADL's : Needs assistance/impaired                 Upper Body Dressing : Set up;Sitting   Lower Body Dressing: Minimal assistance;Sit to/from stand   Toilet Transfer: Min guard;[facility]) Toilet Transfer Details (indicate cue type and reason): Min guard for safety. Patient unsteady on her feet reaching out for walls for stability.  Toileting- Architect and Hygiene: Min guard Toileting - Clothing Manipulation Details (indicate cue type and reason): Min guard to steady in standing.      Functional mobility during ADLs: Min guard;Cane General ADL Comments: Min guard for short-distance mobility to commode in bathroom from EOB. Patient furniture surfs.  Vision   Additional Comments: Patient with decreased vision in L eye at baseline.    Perception     Praxis      Cognition Arousal/Alertness: Awake/alert Behavior During Therapy: WFL for tasks assessed/performed Overall Cognitive  Status: Within Functional Limits for tasks assessed                                 General Comments: Patient A&Ox4 and follows 2-step verbal commands accurately.         Exercises     Shoulder Instructions       General Comments Edema in LUE 2/2 IV.     Pertinent Vitals/ Pain       Pain Assessment: No/denies pain  Home Living                                          Prior Functioning/Environment              Frequency  Min 2X/week        Progress Toward Goals  OT Goals(current goals can now be found in the care plan section)  Progress towards OT goals: Progressing toward goals  Acute Rehab OT Goals Patient Stated Goal: To return home.  OT Goal Formulation: With patient Time For Goal Achievement: 03/02/20 Potential to Achieve Goals: Good ADL Goals Pt Will Perform Grooming: with supervision;sitting Pt Will Perform Toileting - Clothing Manipulation and hygiene: with supervision;sitting/lateral leans  Plan Discharge plan remains appropriate    Co-evaluation                 AM-PAC OT "6 Clicks" Daily Activity     Outcome Measure   Help from another person eating meals?: None Help from another person taking care of personal grooming?: A Little Help from another person toileting, which includes using toliet, bedpan, or urinal?: A Little Help from another person bathing (including washing, rinsing, drying)?: A Little Help from another person to put on and taking off regular upper body clothing?: None Help from another person to put on and taking off regular lower body clothing?: A Little 6 Click Score: 20    End of Session Equipment Utilized During Treatment: Gait belt  OT Visit Diagnosis: Unsteadiness on feet (R26.81);Other abnormalities of gait and mobility (R26.89);Muscle weakness (generalized) (M62.81)   Activity Tolerance Patient tolerated treatment well   Patient Left in bed;with call bell/phone within  reach;with bed alarm set   Nurse Communication          Time: 8850-2774 OT Time Calculation (min): 22 min  Charges: OT General Charges $OT Visit: 1 Visit OT Treatments $Self Care/Home Management : 8-22 mins  Welton Bord H. OTR/L Supplemental OT, Department of rehab services [phone]   Saxton Chain R H. 02/18/2020, 11:44 AM

## 2020-02-18 NOTE — TOC Transition Note (Signed)
Transition of Care [facility]) - CM/SW Discharge Note   Patient Details  Name: Meghan Ford MRN: [id number] Date of Birth: January 25, 1941  Transition of Care [facility]) CM/SW Contact:  Pollie Friar, RN Phone Number: 02/18/2020, 10:37 AM   Clinical Narrative:    CM met with the patient and used the interpretor line to go over plans for d/c. Pt states she lives with her daughters. She states she has: walker, cane, 3 in 1, shower seat at home. Pt states she recently had [facility] services but they are completed. CM inquired about seeing if able to restart them and she declined.  Pt is active with [facility] and uses their pharmacy for her medications. Daughters able to provide transport home when medically ready.    Final next level of care: Home/Self Care Barriers to Discharge: Inadequate or no insurance, Barriers Unresolved (comment)   Patient Goals and CMS Choice     Choice offered to / list presented to : Patient  Discharge Placement                       Discharge Plan and Services                                     Social Determinants of Health (SDOH) Interventions     Readmission Risk Interventions No flowsheet data found.

## 2020-02-19 ENCOUNTER — Telehealth: Payer: Self-pay

## 2020-02-19 NOTE — Telephone Encounter (Signed)
Transition Care Management Unsuccessful Follow-up Telephone Call  Call placed with assistance of Spanish Interpreter # [phone]/[employer]  Date of discharge and from where:  02/18/2020, [facility]   Attempts:  1st Attempt  Reason for unsuccessful TCM follow-up call:  Left voice message - call placed to patient # [phone]. This is also listed as the phone number for son, Alecia Lemming.  Call back requested to this CM.  Patient needs to schedule hospital follow up appointment with PCP

## 2020-02-20 ENCOUNTER — Telehealth: Payer: Self-pay

## 2020-02-20 MED FILL — AMLODIPINE BESYLATE 10 MG T: 10 | 30 days supply | Qty: 15 | Fill #3

## 2020-02-20 MED FILL — GABAPENTIN 300 MG CAPSULE: 300 | 30 days supply | Qty: 30 | Fill #2

## 2020-02-20 MED FILL — LISINOPRIL 20 MG TABLET: 20 | 30 days supply | Qty: 30 | Fill #2

## 2020-02-20 MED FILL — ?CLOPIDOGREL 75MG TA: 75 | 30 days supply | Qty: 30 | Fill #1

## 2020-02-20 MED FILL — ATORVASTATIN CALCIUM 80 MG: 80 | 30 days supply | Qty: 30 | Fill #2

## 2020-02-20 NOTE — Telephone Encounter (Signed)
Transition Care Management Unsuccessful Follow-up Telephone Call    Date of discharge and from where:  02/18/2020, [facility]   Attempts:  2nd Attempt  Reason for unsuccessful TCM follow-up call: - call placed to patient # [phone] reach daughter in law NO DPR to disclose info info. Provided different phone nr to reach pt [phone], unable to reach or leave VM. Patient needs to schedule hospital follow up appointment with PCP

## 2020-02-21 LAB — CULTURE, BLOOD (ROUTINE X 2)
Culture: NO GROWTH
Culture: NO GROWTH
Special Requests: ADEQUATE

## 2020-03-02 ENCOUNTER — Telehealth: Payer: Self-pay

## 2020-03-02 NOTE — Telephone Encounter (Signed)
Patient has hospital follow up appointment scheduled with Dr Alvis Lemmings 03/31/2020

## 2020-03-19 MED FILL — AMLODIPINE BESYLATE 10 MG T: 10 | 30 days supply | Qty: 15 | Fill #4

## 2020-03-30 ENCOUNTER — Telehealth: Payer: Self-pay | Admitting: Family Medicine

## 2020-03-30 NOTE — Telephone Encounter (Signed)
Called patient using interpreter and LVM letting her know that her appointment for tomorrow has been cancelled due to the provider being out of the office. I apologized for the extremely short notice and asked that she call [phone] to reschedule.

## 2020-03-31 ENCOUNTER — Inpatient Hospital Stay: Payer: Self-pay | Admitting: Family Medicine

## 2020-05-06 MED FILL — ISOSORBIDE MN ER 60 MG TAB: 60 | 30 days supply | Qty: 30 | Fill #2

## 2020-05-06 MED FILL — GABAPENTIN 300 MG CAPSULE: 300 | 30 days supply | Qty: 30 | Fill #3

## 2020-05-06 MED FILL — ATORVASTATIN CALCIUM 80 MG: 80 | 30 days supply | Qty: 30 | Fill #3

## 2020-05-06 MED FILL — LISINOPRIL 20 MG TABLET: 20 | 30 days supply | Qty: 30 | Fill #3

## 2020-05-12 MED FILL — TRUEPLUS SYR 0.5ML 31GX5/16: 31G X 5/16" | 25 days supply | Qty: 100 | Fill #1

## 2020-05-12 MED FILL — LANTUS 100 UNITS/ML VIAL: 100 | 31 days supply | Qty: 20 | Fill #2

## 2020-05-24 ENCOUNTER — Other Ambulatory Visit: Payer: Self-pay

## 2020-05-24 ENCOUNTER — Emergency Department (HOSPITAL_COMMUNITY): Payer: Self-pay

## 2020-05-24 ENCOUNTER — Inpatient Hospital Stay (HOSPITAL_COMMUNITY)
Admission: EM | Admit: 2020-05-24 | Discharge: 2020-05-30 | DRG: 291 | Disposition: A | Discharge status: Home / Self Care | Payer: Self-pay | Attending: Internal Medicine | Admitting: Internal Medicine

## 2020-05-24 DIAGNOSIS — Z79899 Other long term (current) drug therapy: Secondary | ICD-10-CM

## 2020-05-24 DIAGNOSIS — N183 Chronic kidney disease, stage 3 unspecified: Secondary | ICD-10-CM

## 2020-05-24 DIAGNOSIS — M170 Bilateral primary osteoarthritis of knee: Secondary | ICD-10-CM | POA: Diagnosis present

## 2020-05-24 DIAGNOSIS — Z96612 Presence of left artificial shoulder joint: Secondary | ICD-10-CM | POA: Diagnosis present

## 2020-05-24 DIAGNOSIS — I130 Hypertensive heart and chronic kidney disease with heart failure and stage 1 through stage 4 chronic kidney disease, or unspecified chronic kidney disease: Principal | ICD-10-CM | POA: Diagnosis present

## 2020-05-24 DIAGNOSIS — I63511 Cerebral infarction due to unspecified occlusion or stenosis of right middle cerebral artery: Secondary | ICD-10-CM | POA: Diagnosis present

## 2020-05-24 DIAGNOSIS — IMO0002 Reserved for concepts with insufficient information to code with codable children: Secondary | ICD-10-CM | POA: Diagnosis present

## 2020-05-24 DIAGNOSIS — Z23 Encounter for immunization: Secondary | ICD-10-CM

## 2020-05-24 DIAGNOSIS — Z794 Long term (current) use of insulin: Secondary | ICD-10-CM

## 2020-05-24 DIAGNOSIS — Z6839 Body mass index (BMI) 39.0-39.9, adult: Secondary | ICD-10-CM

## 2020-05-24 DIAGNOSIS — E669 Obesity, unspecified: Secondary | ICD-10-CM

## 2020-05-24 DIAGNOSIS — R55 Syncope and collapse: Secondary | ICD-10-CM | POA: Diagnosis not present

## 2020-05-24 DIAGNOSIS — E1151 Type 2 diabetes mellitus with diabetic peripheral angiopathy without gangrene: Secondary | ICD-10-CM | POA: Diagnosis present

## 2020-05-24 DIAGNOSIS — I10 Essential (primary) hypertension: Secondary | ICD-10-CM

## 2020-05-24 DIAGNOSIS — N1832 Chronic kidney disease, stage 3b: Secondary | ICD-10-CM | POA: Diagnosis present

## 2020-05-24 DIAGNOSIS — R609 Edema, unspecified: Secondary | ICD-10-CM

## 2020-05-24 DIAGNOSIS — T502X5A Adverse effect of carbonic-anhydrase inhibitors, benzothiadiazides and other diuretics, initial encounter: Secondary | ICD-10-CM | POA: Diagnosis not present

## 2020-05-24 DIAGNOSIS — I5033 Acute on chronic diastolic (congestive) heart failure: Secondary | ICD-10-CM | POA: Diagnosis present

## 2020-05-24 DIAGNOSIS — R0602 Shortness of breath: Secondary | ICD-10-CM

## 2020-05-24 DIAGNOSIS — N179 Acute kidney failure, unspecified: Secondary | ICD-10-CM | POA: Diagnosis present

## 2020-05-24 DIAGNOSIS — Z8673 Personal history of transient ischemic attack (TIA), and cerebral infarction without residual deficits: Secondary | ICD-10-CM

## 2020-05-24 DIAGNOSIS — E1165 Type 2 diabetes mellitus with hyperglycemia: Secondary | ICD-10-CM | POA: Diagnosis present

## 2020-05-24 DIAGNOSIS — E1122 Type 2 diabetes mellitus with diabetic chronic kidney disease: Secondary | ICD-10-CM | POA: Diagnosis present

## 2020-05-24 DIAGNOSIS — E785 Hyperlipidemia, unspecified: Secondary | ICD-10-CM | POA: Diagnosis present

## 2020-05-24 DIAGNOSIS — Z20822 Contact with and (suspected) exposure to covid-19: Secondary | ICD-10-CM | POA: Diagnosis present

## 2020-05-24 DIAGNOSIS — Z8249 Family history of ischemic heart disease and other diseases of the circulatory system: Secondary | ICD-10-CM

## 2020-05-24 DIAGNOSIS — E1142 Type 2 diabetes mellitus with diabetic polyneuropathy: Secondary | ICD-10-CM | POA: Diagnosis present

## 2020-05-24 DIAGNOSIS — E860 Dehydration: Secondary | ICD-10-CM | POA: Diagnosis not present

## 2020-05-24 DIAGNOSIS — I6340 Cerebral infarction due to embolism of unspecified cerebral artery: Secondary | ICD-10-CM

## 2020-05-24 DIAGNOSIS — I161 Hypertensive emergency: Secondary | ICD-10-CM | POA: Diagnosis present

## 2020-05-24 LAB — CBC
HCT: 39.4 % (ref 36.0–46.0)
Hemoglobin: 12.1 g/dL (ref 12.0–15.0)
MCH: 25.9 pg — ABNORMAL LOW (ref 26.0–34.0)
MCHC: 30.7 g/dL (ref 30.0–36.0)
MCV: 84.4 fL (ref 80.0–100.0)
Platelets: 258 10*3/uL (ref 150–400)
RBC: 4.67 MIL/uL (ref 3.87–5.11)
RDW: 15.7 % — ABNORMAL HIGH (ref 11.5–15.5)
WBC: 8.1 10*3/uL (ref 4.0–10.5)
nRBC: 0 % (ref 0.0–0.2)

## 2020-05-24 LAB — BASIC METABOLIC PANEL
Anion gap: 7 (ref 5–15)
BUN: 19 mg/dL (ref 8–23)
CO2: 26 mmol/L (ref 22–32)
Calcium: 8.5 mg/dL — ABNORMAL LOW (ref 8.9–10.3)
Chloride: 108 mmol/L (ref 98–111)
Creatinine, Ser: 1.75 mg/dL — ABNORMAL HIGH (ref 0.44–1.00)
GFR, Estimated: 30 mL/min — ABNORMAL LOW (ref >60)
Glucose, Bld: 107 mg/dL — ABNORMAL HIGH (ref 70–99)
Potassium: 4.7 mmol/L (ref 3.5–5.1)
Sodium: 141 mmol/L (ref 135–145)

## 2020-05-24 MED ORDER — LABETALOL HCL 5 MG/ML IV SOLN
20.0000 mg | Freq: Once | INTRAVENOUS | Status: AC
  Administered 2020-05-24: 20 mg via INTRAVENOUS
  Filled 2020-05-24: qty 4

## 2020-05-24 MED ORDER — FUROSEMIDE 10 MG/ML IJ SOLN
40.0000 mg | Freq: Once | INTRAMUSCULAR | Status: AC
  Administered 2020-05-24: 40 mg via INTRAVENOUS
  Filled 2020-05-24: qty 4

## 2020-05-24 NOTE — ED Triage Notes (Signed)
Patient from home. Complaint of shortness of breath and extremity swelling x3 days. Patient states worse when she lays down. A&Ox4.

## 2020-05-24 NOTE — ED Notes (Signed)
Pt changed into gown, purwick in place, pt medicated per MD order

## 2020-05-24 NOTE — ED Provider Notes (Signed)
[facility] EMERGENCY DEPARTMENT Provider Note   CSN: [id number] Arrival date & time: 05/24/20  1645     History Chief Complaint  Patient presents with  . Shortness of Breath  . Edema    Eugenie Harewood is a 80 y.o. female.  The patient also complains of swelling of both legs.  She had an echo in 2019 that showed a normal EF.  The history is provided by the patient. A language interpreter was used.  Shortness of Breath Severity:  Moderate Onset quality:  Gradual Duration:  3 days Timing:  Constant Progression:  Worsening Chronicity:  New Context comment:  No known cause. States she has been taking all her medications. Relieved by:  Nothing Worsened by:  Exertion (supine position) Ineffective treatments:  Rest Associated symptoms: cough   Associated symptoms: no chest pain, no ear pain, no fever, no rash, no sore throat and no vomiting  Abdominal pain: diffuse.        Past Medical History:  Diagnosis Date  . Arthritis    bil knees  . Blurred vision, left eye   . Blurry vision, bilateral   . Diabetes mellitus type II, uncontrolled ([place])   . Diabetes mellitus without complication ([place])   . Dysrhythmia    patient via intepreter reports at time of last stroke , her heartbeat was very irregular ; HR WDL today, patient denies any headache, chest pain, new numbness or new worsening vision since last stroke   . Fever 09/28/2014  . Fx humeral neck   . Gait disturbance   . Headache 09/28/2014  . History of kidney stones   . Hyperlipidemia LDL goal < 100   . Hypertension   . Left arm weakness    ambulates with cane   . Leg pain    worse with prolonged standing  . Peripheral vascular disease ([place])   . Pyelonephritis 06/14/2017  . Sepsis ([place]) 06/14/2017  . Status post placement of implantable loop recorder   . Stroke [facility]) 2018; 05/08/2017   denies residual from 2018 stroke on 05/08/2017; weakness on left; speech issues from today's stroke  (05/08/2017)  . Varicose veins     Patient Active Problem List   Diagnosis Date Noted  . Hypoglycemia 02/17/2020  . Acute encephalopathy 02/16/2020  . LGI bleed 03/27/2019  . GIB (gastrointestinal bleeding) 03/27/2019  . Rectal bleeding 03/27/2019  . Hydronephrosis concurrent with and due to calculi of kidney and ureter   . Herpes labialis 06/18/2017  . Pyelonephritis   . Renal stone   . Abdominal pain, right lateral 06/16/2017  . Anemia 06/16/2017  . Insulin dependent diabetes mellitus 06/15/2017  . History of CVA (cerebrovascular accident) 05/07/2017  . Stroke ([place]) 05/07/2017  . Intracranial vascular stenosis   . TIA (transient ischemic attack) 11/07/2016  . Insulin dependent type 2 diabetes mellitus, uncontrolled ([place]) 11/07/2016  . HLD (hyperlipidemia) 11/07/2016  . Cerebrovascular accident (CVA) due to stenosis of right middle cerebral artery ([place])   . Noncompliance with diet and medication regimen 08/26/2015  . S/p reverse total shoulder arthroplasty   . Leukocytosis   . Proximal humerus fracture 10/26/2014  . Hypokalemia 10/09/2014  . Essential hypertension 10/09/2014  . Protein-calorie malnutrition, severe ([place]) 09/29/2014  . Severe sepsis ([place]) 09/28/2014  . Diabetes mellitus with neuropathy ([place])   . Pyrexia   . Uncontrolled hypertension 10/15/2012  . Neuropathic pain of both legs 10/15/2012  . Palpitations 05/21/2012  . Hyperlipidemia LDL goal < 100   . Diabetes  mellitus type II, uncontrolled ([place])   . Varicose veins of lower extremities with other complications 90/24/0973    Past Surgical History:  Procedure Laterality Date  . COLONOSCOPY WITH PROPOFOL Left 03/29/2019   Procedure: COLONOSCOPY WITH PROPOFOL;  Surgeon: Ronnette Juniper, MD;  Location: [facility] END[facility];  Service: Gastroenterology;  Laterality: Left;  . FRACTURE SURGERY Left 2017   Shoulder  . HOLMIUM LASER APPLICATION Left 07/14/2990   Procedure: HOLMIUM LASER APPLICATION;  Surgeon: Cleon Gustin,  MD;  Location: [facility];  Service: Urology;  Laterality: Left;  . LOOP RECORDER INSERTION N/A 05/09/2017   Procedure: LOOP RECORDER INSERTION;  Surgeon: Constance Haw, MD;  Location: [facility] CV LAB;  Service: Cardiovascular;  Laterality: N/A;  . NEPHROLITHOTOMY Left 09/18/2017   Procedure: CYSTOSCOPY/LEFT URETEROSCOPIC STONE EXTRACTION/LEFT RETROGRADE/ STENT PLACEMENT;  Surgeon: Cleon Gustin, MD;  Location: [facility];  Service: Urology;  Laterality: Left;  . POLYPECTOMY  03/29/2019   Procedure: POLYPECTOMY;  Surgeon: Ronnette Juniper, MD;  Location: [facility];  Service: Gastroenterology;;  . REVERSE SHOULDER ARTHROPLASTY Left 10/27/2014   Procedure: REVERSE SHOULDER ARTHROPLASTY;  Surgeon: Renette Butters, MD;  Location: [facility];  Service: Orthopedics;  Laterality: Left;  . TEE WITHOUT CARDIOVERSION N/A 05/09/2017   Procedure: TRANSESOPHAGEAL ECHOCARDIOGRAM (TEE);  Surgeon: Jerline Pain, MD;  Location: [facility];  Service: Cardiovascular;  Laterality: N/A;  . TOTAL SHOULDER ARTHROPLASTY Left 10/27/2014   Procedure: TOTAL SHOULDER ARTHROPLASTY;  Surgeon: Renette Butters, MD;  Location: [facility];  Service: Orthopedics;  Laterality: Left;  Marland Kitchen VEIN SURGERY Right 1998?   right leg     OB History    Gravida  0   Para  0   Term  0   Preterm  0   AB  0   Living        SAB  0   IAB  0   Ectopic  0   Multiple      Live Births              Family History  Problem Relation Age of Onset  . Cancer Brother   . Heart disease Sister     Social History   Tobacco Use  . Smoking status: Never Smoker  . Smokeless tobacco: Never Used  Vaping Use  . Vaping Use: Never used  Substance Use Topics  . Alcohol use: No  . Drug use: Never    Home Medications Prior to Admission medications   Medication Sig Start Date End Date Taking? Authorizing Provider  aspirin 81 MG tablet Take 4 tablets (325 mg total) by mouth daily. Start 03/31/2019 03/29/19   Georgette Shell, MD   atorvastatin (LIPITOR) 80 MG tablet Take 1 tablet (80 mg total) by mouth daily. 09/24/19   Charlott Rakes, MD  blood glucose meter kit and supplies KIT Dispense based on patient and insurance preference. Use up to four times daily as directed. (FOR ICD-9 250.00, 250.01). 11/09/16   Hosie Poisson, MD  Blood Glucose Monitoring Suppl (TRUE METRIX METER) DEVI 1 each by Does not apply route 3 (three) times daily before meals. 05/14/17   Charlott Rakes, MD  clopidogrel (PLAVIX) 75 MG tablet Take 1 tablet (75 mg total) by mouth daily. Start taking Plavix on 03/31/2019. 09/24/19   Charlott Rakes, MD  gabapentin (NEURONTIN) 300 MG capsule Take 1 capsule (300 mg total) by mouth at bedtime. 11/11/19   Charlott Rakes, MD  glucose blood (TRUE METRIX BLOOD GLUCOSE TEST) test strip Use  3 times daily before meals 12/30/18   Karle Plumber B, MD  glucose blood test strip Use as instructed 11/09/16   Hosie Poisson, MD  Insulin Syringes, Disposable, U-100 0.3 ML MISC USE AS DIRECTED 08/14/17   Charlott Rakes, MD  isosorbide mononitrate (IMDUR) 60 MG 24 hr tablet Take 1 tablet (60 mg total) by mouth daily. 09/24/19   Charlott Rakes, MD  lisinopril (ZESTRIL) 20 MG tablet Take 1 tablet (20 mg total) by mouth daily. 09/24/19   Charlott Rakes, MD  naproxen (NAPROSYN) 500 MG tablet Take 1 tablet (500 mg total) by mouth 2 (two) times daily with a meal. 09/24/19   Newlin, Enobong, MD  senna-docusate (SENOKOT-S) 8.6-50 MG tablet Take 1 tablet by mouth at bedtime as needed for mild constipation. 05/09/17   Aline August, MD  TRUEPLUS INSULIN SYRINGE 31G X 5/16" 0.5 ML MISC USE AS DIRECTED. 01/01/20   Charlott Rakes, MD  TRUEPLUS LANCETS 28G MISC 1 each by Does not apply route 3 (three) times daily before meals. 05/14/17   Charlott Rakes, MD    Allergies    Patient has no known allergies.  Review of Systems   Review of Systems  Constitutional: Negative for chills and fever.       Lives at home with family, ambulates with a  walker  HENT: Negative for ear pain and sore throat.   Eyes: Negative for pain and visual disturbance.  Respiratory: Positive for cough and shortness of breath.   Cardiovascular: Positive for leg swelling. Negative for chest pain and palpitations.  Gastrointestinal: Negative for vomiting. Abdominal pain: diffuse.  Genitourinary: Negative for dysuria and hematuria.  Musculoskeletal: Negative for arthralgias and back pain.  Skin: Negative for color change and rash.  Neurological: Negative for seizures and syncope.  All other systems reviewed and are negative.   Physical Exam Updated Vital Signs BP (!) 222/83   Pulse 73   Temp 98.9 F (37.2 C)   Resp 15   SpO2 99%   Physical Exam Vitals and nursing note reviewed.  Constitutional:      General: She is not in acute distress.    Appearance: She is well-developed.  HENT:     Head: Normocephalic and atraumatic.  Eyes:     Conjunctiva/sclera: Conjunctivae normal.  Cardiovascular:     Rate and Rhythm: Normal rate and regular rhythm.     Heart sounds: Murmur heard.    Pulmonary:     Effort: Tachypnea present. No respiratory distress.     Breath sounds: Wheezing present.     Comments: Mild, diffuse wheezes Chest:     Chest wall: No mass or deformity.  Abdominal:     Palpations: Abdomen is soft.     Tenderness: There is no abdominal tenderness.     Comments: Edema of the abdominal wall  Musculoskeletal:     Cervical back: Neck supple.     Right lower leg: Edema present.     Left lower leg: Edema present.     Comments: 4+ pitting edema that extends all the way to the groin bilaterally.  Extremities are warm and well perfused.  Skin:    General: Skin is warm and dry.  Neurological:     General: No focal deficit present.     Mental Status: She is alert.  Psychiatric:        Mood and Affect: Mood normal.     ED Results / Procedures / Treatments   Labs (all labs ordered are listed, but only  abnormal results are  displayed) Labs Reviewed  BASIC METABOLIC PANEL - Abnormal; Notable for the following components:      Result Value   Glucose, Bld 107 (*)    Creatinine, Ser 1.75 (*)    Calcium 8.5 (*)    GFR, Estimated 30 (*)    All other components within normal limits  CBC - Abnormal; Notable for the following components:   MCH 25.9 (*)    RDW 15.7 (*)    All other components within normal limits  BRAIN NATRIURETIC PEPTIDE - Abnormal; Notable for the following components:   B Natriuretic Peptide 157.8 (*)    All other components within normal limits  HEMOGLOBIN A1C - Abnormal; Notable for the following components:   Hgb A1c MFr Bld 6.8 (*)    All other components within normal limits  CREATININE, SERUM - Abnormal; Notable for the following components:   Creatinine, Ser 1.69 (*)    GFR, Estimated 31 (*)    All other components within normal limits  CBC - Abnormal; Notable for the following components:   Hemoglobin 10.2 (*)    HCT 33.8 (*)    MCH 25.7 (*)    RDW 15.8 (*)    All other components within normal limits  CBG MONITORING, ED - Abnormal; Notable for the following components:   Glucose-Capillary 172 (*)    All other components within normal limits  TROPONIN I (HIGH SENSITIVITY) - Abnormal; Notable for the following components:   Troponin I (High Sensitivity) 28 (*)    All other components within normal limits  TROPONIN I (HIGH SENSITIVITY) - Abnormal; Notable for the following components:   Troponin I (High Sensitivity) 29 (*)    All other components within normal limits  TROPONIN I (HIGH SENSITIVITY) - Abnormal; Notable for the following components:   Troponin I (High Sensitivity) 21 (*)    All other components within normal limits  SARS CORONAVIRUS 2 (TAT 6-24 HRS)  CBG MONITORING, ED  TROPONIN I (HIGH SENSITIVITY)    EKG EKG Interpretation  Date/Time:  Monday May 24 2020 16:58:37 EDT Ventricular Rate:  74 PR Interval:    QRS Duration: 78 QT Interval:  348 QTC  Calculation: 386 R Axis:   7 Text Interpretation: Sinus rhythm Interpretation limited secondary to artifact Nonspecific T wave abnormality Abnormal ECG Confirmed by Ripley Fraise [phone]) on 05/25/2020 12:19:34 AM   Radiology DG Chest 2 View  Result Date: 05/24/2020 CLINICAL DATA:  Shortness of breath and lower extremity swelling x3 days EXAM: CHEST - 2 VIEW COMPARISON:  Chest radiograph February 16, 2020. FINDINGS: Cardiac monitoring device overlies the left chest. Enlarged cardiac silhouette. Left basilar opacity with obscuration left hemidiaphragm. Small left pleural effusion. Left reverse shoulder arthroplasty. IMPRESSION: Left basilar opacity and small left pleural effusion. Electronically Signed   By: Dahlia Bailiff MD   On: 05/24/2020 18:03    Procedures Procedures   Medications Ordered in ED Medications  labetalol (NORMODYNE) injection 20 mg (has no administration in time range)  furosemide (LASIX) injection 40 mg (has no administration in time range)    ED Course  I have reviewed the triage vital signs and the nursing notes.  Pertinent labs & imaging results that were available during my care of the patient were reviewed by me and considered in my medical decision making (see chart for details).    MDM Rules/Calculators/A&P  The patient presented with lower extremity swelling and dyspnea.  It appears as if she is experiencing CHF.  She also had marked hypertension.  I have given her labetalol x2.  She is awaiting troponin and B natruretic peptide.  Admission is planned. Final Clinical Impression(s) / ED Diagnoses Final diagnoses:  Uncontrolled type 2 diabetes mellitus with hyperglycemia ([place])  Peripheral edema  Shortness of breath    Rx / DC Orders ED Discharge Orders    None       Arnaldo Natal, MD 05/25/20 [phone]

## 2020-05-25 ENCOUNTER — Inpatient Hospital Stay (HOSPITAL_COMMUNITY): Payer: Self-pay

## 2020-05-25 ENCOUNTER — Encounter (HOSPITAL_COMMUNITY): Payer: Self-pay | Admitting: Family Medicine

## 2020-05-25 DIAGNOSIS — N183 Chronic kidney disease, stage 3 unspecified: Secondary | ICD-10-CM

## 2020-05-25 DIAGNOSIS — I10 Essential (primary) hypertension: Secondary | ICD-10-CM

## 2020-05-25 DIAGNOSIS — E11641 Type 2 diabetes mellitus with hypoglycemia with coma: Secondary | ICD-10-CM

## 2020-05-25 DIAGNOSIS — I5022 Chronic systolic (congestive) heart failure: Secondary | ICD-10-CM

## 2020-05-25 DIAGNOSIS — I63511 Cerebral infarction due to unspecified occlusion or stenosis of right middle cerebral artery: Secondary | ICD-10-CM

## 2020-05-25 DIAGNOSIS — E669 Obesity, unspecified: Secondary | ICD-10-CM

## 2020-05-25 DIAGNOSIS — E1122 Type 2 diabetes mellitus with diabetic chronic kidney disease: Secondary | ICD-10-CM

## 2020-05-25 DIAGNOSIS — I160 Hypertensive urgency: Secondary | ICD-10-CM | POA: Insufficient documentation

## 2020-05-25 DIAGNOSIS — I5033 Acute on chronic diastolic (congestive) heart failure: Secondary | ICD-10-CM

## 2020-05-25 DIAGNOSIS — R600 Localized edema: Secondary | ICD-10-CM | POA: Insufficient documentation

## 2020-05-25 DIAGNOSIS — N1832 Chronic kidney disease, stage 3b: Secondary | ICD-10-CM

## 2020-05-25 DIAGNOSIS — R0600 Dyspnea, unspecified: Secondary | ICD-10-CM | POA: Insufficient documentation

## 2020-05-25 LAB — CBG MONITORING, ED
Glucose-Capillary: 172 mg/dL — ABNORMAL HIGH (ref 70–99)
Glucose-Capillary: 84 mg/dL (ref 70–99)

## 2020-05-25 LAB — GLUCOSE, CAPILLARY
Glucose-Capillary: 113 mg/dL — ABNORMAL HIGH (ref 70–99)
Glucose-Capillary: 124 mg/dL — ABNORMAL HIGH (ref 70–99)
Glucose-Capillary: 138 mg/dL — ABNORMAL HIGH (ref 70–99)

## 2020-05-25 LAB — ECHOCARDIOGRAM COMPLETE
Area-P 1/2: 2.13 cm2
P 1/2 time: 710 msec
S' Lateral: 3 cm

## 2020-05-25 LAB — HEMOGLOBIN A1C
Hgb A1c MFr Bld: 6.8 % — ABNORMAL HIGH (ref 4.8–5.6)
Mean Plasma Glucose: 148.46 mg/dL

## 2020-05-25 LAB — CBC
HCT: 33.8 % — ABNORMAL LOW (ref 36.0–46.0)
Hemoglobin: 10.2 g/dL — ABNORMAL LOW (ref 12.0–15.0)
MCH: 25.7 pg — ABNORMAL LOW (ref 26.0–34.0)
MCHC: 30.2 g/dL (ref 30.0–36.0)
MCV: 85.1 fL (ref 80.0–100.0)
Platelets: 241 10*3/uL (ref 150–400)
RBC: 3.97 MIL/uL (ref 3.87–5.11)
RDW: 15.8 % — ABNORMAL HIGH (ref 11.5–15.5)
WBC: 7.4 10*3/uL (ref 4.0–10.5)
nRBC: 0 % (ref 0.0–0.2)

## 2020-05-25 LAB — BASIC METABOLIC PANEL
Anion gap: 7 (ref 5–15)
BUN: 21 mg/dL (ref 8–23)
CO2: 25 mmol/L (ref 22–32)
Calcium: 7.8 mg/dL — ABNORMAL LOW (ref 8.9–10.3)
Chloride: 106 mmol/L (ref 98–111)
Creatinine, Ser: 1.8 mg/dL — ABNORMAL HIGH (ref 0.44–1.00)
GFR, Estimated: 29 mL/min — ABNORMAL LOW (ref >60)
Glucose, Bld: 131 mg/dL — ABNORMAL HIGH (ref 70–99)
Potassium: 4.3 mmol/L (ref 3.5–5.1)
Sodium: 138 mmol/L (ref 135–145)

## 2020-05-25 LAB — TROPONIN I (HIGH SENSITIVITY)
Troponin I (High Sensitivity): 14 ng/L (ref <18)
Troponin I (High Sensitivity): 21 ng/L — ABNORMAL HIGH (ref <18)
Troponin I (High Sensitivity): 28 ng/L — ABNORMAL HIGH (ref <18)
Troponin I (High Sensitivity): 29 ng/L — ABNORMAL HIGH (ref <18)

## 2020-05-25 LAB — BRAIN NATRIURETIC PEPTIDE: B Natriuretic Peptide: 157.8 pg/mL — ABNORMAL HIGH (ref 0.0–100.0)

## 2020-05-25 LAB — CREATININE, SERUM
Creatinine, Ser: 1.69 mg/dL — ABNORMAL HIGH (ref 0.44–1.00)
GFR, Estimated: 31 mL/min — ABNORMAL LOW (ref >60)

## 2020-05-25 LAB — SARS CORONAVIRUS 2 (TAT 6-24 HRS): SARS Coronavirus 2: NEGATIVE

## 2020-05-25 MED ORDER — AMLODIPINE BESYLATE 10 MG PO TABS
10.0000 mg | ORAL_TABLET | Freq: Every day | ORAL | Status: DC
  Administered 2020-05-26 – 2020-05-30 (×5): 10 mg via ORAL
  Filled 2020-05-25 (×5): qty 1

## 2020-05-25 MED ORDER — ONDANSETRON HCL 4 MG/2ML IJ SOLN
4.0000 mg | Freq: Four times a day (QID) | INTRAMUSCULAR | Status: DC | PRN
  Administered 2020-05-27: 4 mg via INTRAVENOUS
  Filled 2020-05-25: qty 2

## 2020-05-25 MED ORDER — SODIUM CHLORIDE 0.9 % IV SOLN: 250.0000 mL | INTRAVENOUS | Status: DC | PRN

## 2020-05-25 MED ORDER — LABETALOL HCL 5 MG/ML IV SOLN
20.0000 mg | Freq: Once | INTRAVENOUS | Status: AC
  Administered 2020-05-25: 20 mg via INTRAVENOUS
  Filled 2020-05-25: qty 4

## 2020-05-25 MED ORDER — LISINOPRIL 20 MG PO TABS
20.0000 mg | ORAL_TABLET | Freq: Every day | ORAL | Status: DC
  Administered 2020-05-25: 20 mg via ORAL
  Filled 2020-05-25: qty 1

## 2020-05-25 MED ORDER — HEPARIN SODIUM (PORCINE) 5000 UNIT/ML IJ SOLN
5000.0000 [IU] | Freq: Three times a day (TID) | INTRAMUSCULAR | Status: DC
  Administered 2020-05-25 – 2020-05-30 (×16): 5000 [IU] via SUBCUTANEOUS
  Filled 2020-05-25 (×15): qty 1

## 2020-05-25 MED ORDER — INSULIN ASPART 100 UNIT/ML ~~LOC~~ SOLN
0.0000 [IU] | Freq: Three times a day (TID) | SUBCUTANEOUS | Status: DC
  Administered 2020-05-25: 3 [IU] via SUBCUTANEOUS
  Administered 2020-05-25 – 2020-05-27 (×4): 2 [IU] via SUBCUTANEOUS
  Administered 2020-05-27: 3 [IU] via SUBCUTANEOUS

## 2020-05-25 MED ORDER — POTASSIUM CHLORIDE CRYS ER 20 MEQ PO TBCR
20.0000 meq | EXTENDED_RELEASE_TABLET | Freq: Two times a day (BID) | ORAL | Status: DC
  Administered 2020-05-25: 20 meq via ORAL
  Filled 2020-05-25: qty 1

## 2020-05-25 MED ORDER — FUROSEMIDE 10 MG/ML IJ SOLN
40.0000 mg | Freq: Two times a day (BID) | INTRAMUSCULAR | Status: AC
  Administered 2020-05-25 – 2020-05-26 (×4): 40 mg via INTRAVENOUS
  Filled 2020-05-25 (×4): qty 4

## 2020-05-25 MED ORDER — SODIUM CHLORIDE 0.9% FLUSH
3.0000 mL | Freq: Two times a day (BID) | INTRAVENOUS | Status: DC
  Administered 2020-05-25 – 2020-05-29 (×8): 3 mL via INTRAVENOUS

## 2020-05-25 MED ORDER — NITROGLYCERIN 2 % TD OINT
1.0000 [in_us] | TOPICAL_OINTMENT | Freq: Once | TRANSDERMAL | Status: AC
  Administered 2020-05-25: 1 [in_us] via TOPICAL
  Filled 2020-05-25: qty 1

## 2020-05-25 MED ORDER — HYDRALAZINE HCL 50 MG PO TABS
50.0000 mg | ORAL_TABLET | Freq: Three times a day (TID) | ORAL | Status: DC
  Administered 2020-05-25 – 2020-05-26 (×3): 50 mg via ORAL
  Filled 2020-05-25 (×3): qty 1

## 2020-05-25 MED ORDER — SENNOSIDES-DOCUSATE SODIUM 8.6-50 MG PO TABS: 1.0000 | ORAL_TABLET | Freq: Every evening | ORAL | Status: DC | PRN

## 2020-05-25 MED ORDER — ASPIRIN EC 81 MG PO TBEC
325.0000 mg | DELAYED_RELEASE_TABLET | Freq: Every day | ORAL | Status: DC
  Administered 2020-05-25: 325 mg via ORAL
  Filled 2020-05-25: qty 4

## 2020-05-25 MED ORDER — CLONIDINE HCL 0.2 MG PO TABS
0.2000 mg | ORAL_TABLET | Freq: Once | ORAL | Status: AC
  Administered 2020-05-25: 0.2 mg via ORAL
  Filled 2020-05-25: qty 1

## 2020-05-25 MED ORDER — ISOSORBIDE MONONITRATE ER 60 MG PO TB24
60.0000 mg | ORAL_TABLET | Freq: Every day | ORAL | Status: DC
  Administered 2020-05-25 – 2020-05-30 (×6): 60 mg via ORAL
  Filled 2020-05-25 (×5): qty 1
  Filled 2020-05-25: qty 2

## 2020-05-25 MED ORDER — SODIUM CHLORIDE 0.9% FLUSH: 3.0000 mL | INTRAVENOUS | Status: DC | PRN

## 2020-05-25 MED ORDER — ATORVASTATIN CALCIUM 80 MG PO TABS
80.0000 mg | ORAL_TABLET | Freq: Every day | ORAL | Status: DC
  Administered 2020-05-25 – 2020-05-30 (×6): 80 mg via ORAL
  Filled 2020-05-25 (×6): qty 1

## 2020-05-25 MED ORDER — ACETAMINOPHEN 325 MG PO TABS
650.0000 mg | ORAL_TABLET | ORAL | Status: DC | PRN
  Administered 2020-05-29: 650 mg via ORAL
  Filled 2020-05-25: qty 2

## 2020-05-25 MED ORDER — AMLODIPINE BESYLATE 5 MG PO TABS
5.0000 mg | ORAL_TABLET | Freq: Every day | ORAL | Status: DC
  Administered 2020-05-25: 5 mg via ORAL
  Filled 2020-05-25: qty 1

## 2020-05-25 MED ORDER — INSULIN ASPART 100 UNIT/ML ~~LOC~~ SOLN: 0.0000 [IU] | Freq: Every day | SUBCUTANEOUS | Status: DC

## 2020-05-25 MED ORDER — PNEUMOCOCCAL VAC POLYVALENT 25 MCG/0.5ML IJ INJ
0.5000 mL | INJECTION | INTRAMUSCULAR | Status: AC
  Administered 2020-05-28: 0.5 mL via INTRAMUSCULAR
  Filled 2020-05-25: qty 0.5

## 2020-05-25 MED ORDER — AMLODIPINE BESYLATE 5 MG PO TABS
5.0000 mg | ORAL_TABLET | Freq: Once | ORAL | Status: AC
  Administered 2020-05-25: 5 mg via ORAL
  Filled 2020-05-25: qty 1

## 2020-05-25 NOTE — Progress Notes (Signed)
  Echocardiogram 2D Echocardiogram has been performed.  Meghan Ford 05/25/2020, 12:06 PM

## 2020-05-25 NOTE — ED Provider Notes (Signed)
EKG Interpretation  Date/Time:  Monday May 24 2020 16:58:37 EDT Ventricular Rate:  74 PR Interval:    QRS Duration: 78 QT Interval:  348 QTC Calculation: 386 R Axis:   7 Text Interpretation: Sinus rhythm Interpretation limited secondary to artifact Nonspecific T wave abnormality Abnormal ECG Confirmed by Zadie Rhine ([id number]) on 05/25/2020 12:19:34 AM      I assumed care in signout to follow-up on labs.  Patient to be admitted for presumed CHF and elevated blood pressure.  On my exam patient is in no respiratory distress.  She has mild wheezing noted on lung exam.  She has peripheral edema. We will place her on nitroglycerin.  Discussed with Dr. Rachael Darby for admission   Zadie Rhine, MD 05/25/20 [phone]

## 2020-05-25 NOTE — H&P (Signed)
History and Physical    Meghan Ford ERD:[id number] DOB: [birth year] DOA: 05/24/2020  PCP: Charlott Rakes, MD   Patient coming from: Home  Chief Complaint: Swelling of legs, SOB with exertion.   HPI: Meghan Ford is a 80 y.o. female with medical history significant for HTN, DMT2, HFpEF, HLD, PVD, hx of CVA who presents with complaint of increasing SOB and edema of legs. Pt speaks limited english. Seen with aide of interpretor. She reports increased edema of her legs over the last week with increasing shortness of breath.  She also reports having increased feeling of smothering when she lays flat which is not normal for her.  Denies any chest pain or palpitations.  Has not had any recent fever, chills, abdominal pain, nausea, vomiting, diarrhea.  She denies any dysuria or flank pain.  She reports has been taking her medications as prescribed. Her last echocardiogram was in 2019 and at that time she had a normal EF. Denies tobacco, ETOH use. Lives with family.   ED Course: In the emergency room she is found to have a significant elevated blood pressure over 200/100.  She was given labetalol IV in the emergency room.  She was also placed on nitroglycerin paste.  Review of Systems:  General:Reports weakness. Denies fever, chills, weight loss, night sweats.  Denies dizziness. HENT: Denies head trauma, headache, denies change in hearing, tinnitus.  Denies nasal bleeding.  Denies sore throat, sores in mouth.  Denies difficulty swallowing Eyes: Denies blurry vision, pain in eye, drainage.  Denies discoloration of eyes. Neck: Denies pain.  Denies swelling.  Denies pain with movement. Cardiovascular: Denies chest pain, palpitations.  Reports edema of leg. Reports orthopnea Respiratory: Denies shortness of breath, cough.  Denies wheezing.  Denies sputum production Gastrointestinal: Denies abdominal pain, swelling. Denies nausea, vomiting, diarrhea. Denies melena.  Denies  hematemesis. Musculoskeletal: Denies limitation of movement.  Denies deformity or swelling.  Denies pain.  Denies arthralgias or myalgias. Genitourinary: Denies pelvic pain.  Denies urinary frequency or hesitancy.  Denies dysuria.  Skin: Denies rash.  Denies petechiae, purpura, ecchymosis. Neurological: Denies headache.  Denies syncope. Denies seizure activity. Denies paresthesia. Denies slurred speech, drooping face.  Denies visual change. Psychiatric: Denies depression, anxiety. Denies hallucinations.  Past Medical History:  Diagnosis Date  . Arthritis    bil knees  . Blurred vision, left eye   . Blurry vision, bilateral   . Diabetes mellitus type II, uncontrolled ([place])   . Diabetes mellitus without complication ([place])   . Dysrhythmia    patient via intepreter reports at time of last stroke , her heartbeat was very irregular ; HR WDL today, patient denies any headache, chest pain, new numbness or new worsening vision since last stroke   . Fever 09/28/2014  . Fx humeral neck   . Gait disturbance   . Headache 09/28/2014  . History of kidney stones   . Hyperlipidemia LDL goal < 100   . Hypertension   . Left arm weakness    ambulates with cane   . Leg pain    worse with prolonged standing  . Peripheral vascular disease ([place])   . Pyelonephritis 06/14/2017  . Sepsis ([place]) 06/14/2017  . Status post placement of implantable loop recorder   . Stroke [facility]) 2018; 05/08/2017   denies residual from 2018 stroke on 05/08/2017; weakness on left; speech issues from today's stroke (05/08/2017)  . Varicose veins     Past Surgical History:  Procedure Laterality Date  . COLONOSCOPY WITH PROPOFOL Left  03/29/2019   Procedure: COLONOSCOPY WITH PROPOFOL;  Surgeon: Ronnette Juniper, MD;  Location: [facility];  Service: Gastroenterology;  Laterality: Left;  . FRACTURE SURGERY Left 2017   Shoulder  . HOLMIUM LASER APPLICATION Left 03/18/1094   Procedure: HOLMIUM LASER APPLICATION;  Surgeon: Cleon Gustin, MD;  Location: [facility];  Service: Urology;  Laterality: Left;  . LOOP RECORDER INSERTION N/A 05/09/2017   Procedure: LOOP RECORDER INSERTION;  Surgeon: Constance Haw, MD;  Location: [place] [facility] LAB;  Service: Cardiovascular;  Laterality: N/A;  . NEPHROLITHOTOMY Left 09/18/2017   Procedure: CYSTOSCOPY/LEFT URETEROSCOPIC STONE EXTRACTION/LEFT RETROGRADE/ STENT PLACEMENT;  Surgeon: Cleon Gustin, MD;  Location: [facility];  Service: Urology;  Laterality: Left;  . POLYPECTOMY  03/29/2019   Procedure: POLYPECTOMY;  Surgeon: Ronnette Juniper, MD;  Location: [facility];  Service: Gastroenterology;;  . REVERSE SHOULDER ARTHROPLASTY Left 10/27/2014   Procedure: REVERSE SHOULDER ARTHROPLASTY;  Surgeon: Renette Butters, MD;  Location: [place];  Service: Orthopedics;  Laterality: Left;  . TEE WITHOUT CARDIOVERSION N/A 05/09/2017   Procedure: TRANSESOPHAGEAL ECHOCARDIOGRAM (TEE);  Surgeon: Jerline Pain, MD;  Location[facility] ENDOSCOPY;  Service: Cardiovascular;  Laterality: N/A;  . TOTAL SHOULDER ARTHROPLASTY Left 10/27/2014   Procedure: TOTAL SHOULDER ARTHROPLASTY;  Surgeon: Renette Butters, MD;  Location: [place];  Service: Orthopedics;  Laterality: Left;  Marland Kitchen VEIN SURGERY Right 1998?   right leg    Social History  reports that she has never smoked. She has never used smokeless tobacco. She reports that she does not drink alcohol and does not use drugs.  No Known Allergies  Family History  Problem Relation Age of Onset  . Cancer Brother   . Heart disease Sister      Prior to Admission medications   Medication Sig Start Date End Date Taking? Authorizing Provider  aspirin 81 MG tablet Take 4 tablets (325 mg total) by mouth daily. Start 03/31/2019 03/29/19   Georgette Shell, MD  atorvastatin (LIPITOR) 80 MG tablet Take 1 tablet (80 mg total) by mouth daily. 09/24/19   Charlott Rakes, MD  blood glucose meter kit and supplies KIT Dispense based on patient and insurance preference. Use up to four  times daily as directed. (FOR ICD-9 250.00, 250.01). 11/09/16   Hosie Poisson, MD  Blood Glucose Monitoring Suppl (TRUE METRIX METER) DEVI 1 each by Does not apply route 3 (three) times daily before meals. 05/14/17   Charlott Rakes, MD  clopidogrel (PLAVIX) 75 MG tablet Take 1 tablet (75 mg total) by mouth daily. Start taking Plavix on 03/31/2019. 09/24/19   Charlott Rakes, MD  gabapentin (NEURONTIN) 300 MG capsule Take 1 capsule (300 mg total) by mouth at bedtime. 11/11/19   Charlott Rakes, MD  glucose blood (TRUE METRIX BLOOD GLUCOSE TEST) test strip Use 3 times daily before meals 12/30/18   Karle Plumber B, MD  glucose blood test strip Use as instructed 11/09/16   Hosie Poisson, MD  Insulin Syringes, Disposable, U-100 0.3 ML MISC USE AS DIRECTED 08/14/17   Charlott Rakes, MD  isosorbide mononitrate (IMDUR) 60 MG 24 hr tablet Take 1 tablet (60 mg total) by mouth daily. 09/24/19   Charlott Rakes, MD  lisinopril (ZESTRIL) 20 MG tablet Take 1 tablet (20 mg total) by mouth daily. 09/24/19   Charlott Rakes, MD  naproxen (NAPROSYN) 500 MG tablet Take 1 tablet (500 mg total) by mouth 2 (two) times daily with a meal. 09/24/19   Charlott Rakes, MD  senna-docusate (SENOKOT-S) 8.6-50 MG  tablet Take 1 tablet by mouth at bedtime as needed for mild constipation. 05/09/17   Aline August, MD  TRUEPLUS INSULIN SYRINGE 31G X 5/16" 0.5 ML MISC USE AS DIRECTED. 01/01/20   Charlott Rakes, MD  TRUEPLUS LANCETS 28G MISC 1 each by Does not apply route 3 (three) times daily before meals. 05/14/17   Charlott Rakes, MD    Physical Exam: Vitals:   05/25/20 0045 05/25/20 0130 05/25/20 0245 05/25/20 0325  BP: (!) 169/67 (!) 202/170 (!) 199/75 (!) 193/80  Pulse: (!) 55 (!) 56 (!) 57   Resp: (!) _0 Temp:      SpO2: 95% 97% 97%     Constitutional: NAD, calm, comfortable Vitals:   05/25/20 0045 05/25/20 0130 05/25/20 0245 05/25/20 0325  BP: (!) 169/67 (!) 202/170 (!) 199/75 (!) 193/80  Pulse: (!) 55 (!) 56 (!)  57   Resp: (!) _1 Temp:      SpO2: 95% 97% 97%    General: WDWN, Alert and oriented x3.  Eyes: EOMI, PERRL, conjunctivae normal.  Sclera nonicteric HENT:  [place]/AT, external ears normal. Nares patent without epistasis.  Mucous membranes are moist. Posterior pharynx clear of any exudate or lesions.  Edentulous Neck: Soft, normal range of motion, supple, no masses, Trachea midline Respiratory: Equal breath sounds but diminished.  Bibasilar crackles noted no wheezing. Normal respiratory effort. No accessory muscle use.  Cardiovascular: Regular rate and rhythm, no murmurs / rubs / gallops. +3 lower extremity edema. 1+ pedal pulses.  Abdomen: Soft, no tenderness, nondistended, no rebound or guarding. Obese. No masses palpated. Bowel sounds normoactive Musculoskeletal: FROM. no cyanosis. No joint deformity upper and lower extremities. Normal muscle tone.  Legs tender to palpation Skin: Warm, dry, intact no rashes, lesions, ulcers. No induration Neurologic: CN 2-12 grossly intact. Normal speech. Sensation intact, Strength 5/5 in all extremities.   Psychiatric: Normal judgment and insight.  Normal mood.    Labs on Admission: I have personally reviewed following labs and imaging studies  CBC: Recent Labs  Lab 05/24/20 1731  WBC 8.1  HGB 12.1  HCT 39.4  MCV 84.4  PLT 283    Basic Metabolic Panel: Recent Labs  Lab 05/24/20 1731  NA 141  K 4.7  CL 108  CO2 26  GLUCOSE 107*  BUN 19  CREATININE 1.75*  CALCIUM 8.5*    GFR: CrCl cannot be calculated (Unknown ideal weight.).  Liver Function Tests: No results for input(s): AST, ALT, ALKPHOS, BILITOT, PROT, ALBUMIN in the last 168 hours.  Urine analysis:    Component Value Date/Time   COLORURINE AMBER (A) 02/16/2020 2135   APPEARANCEUR CLOUDY (A) 02/16/2020 2135   LABSPEC 1.031 (H) 02/16/2020 2135   PHURINE 5.0 02/16/2020 2135   GLUCOSEU 150 (A) 02/16/2020 2135   HGBUR NEGATIVE 02/16/2020 2135   BILIRUBINUR NEGATIVE  02/16/2020 2135   BILIRUBINUR negative 08/25/2015 1515   KETONESUR NEGATIVE 02/16/2020 2135   PROTEINUR >=300 (A) 02/16/2020 2135   UROBILINOGEN 1.0 08/25/2015 1515   UROBILINOGEN 0.2 10/27/2014 2321   NITRITE NEGATIVE 02/16/2020 2135   LEUKOCYTESUR NEGATIVE 02/16/2020 2135    Radiological Exams on Admission: DG Chest 2 View  Result Date: 05/24/2020 CLINICAL DATA:  Shortness of breath and lower extremity swelling x3 days EXAM: CHEST - 2 VIEW COMPARISON:  Chest radiograph February 16, 2020. FINDINGS: Cardiac monitoring device overlies the left chest. Enlarged cardiac silhouette. Left basilar opacity with obscuration left hemidiaphragm. Small left pleural effusion. Left reverse  shoulder arthroplasty. IMPRESSION: Left basilar opacity and small left pleural effusion. Electronically Signed   By: Dahlia Bailiff MD   On: 05/24/2020 18:03    EKG: Independently reviewed.  EKG shows normal sinus rhythm with nonspecific ST changes diffusely.  No acute ST elevation.  QTc 386  Assessment/Plan Principal Problem:   Acute on chronic heart failure with preserved ejection fraction (HFpEF)  Ms. Guymon is admitted to cardiac telemetry floor.  Monitor serial troponin levels.  Diuresis with lasix and monitor I&Os and daily weights.  Check Echocardiogram in am to evaluate EF, wall motion and valvular function.  Incentive spirometry every two hours while awake  Fall precautions. Up with assistance.  Heart healthy low-carb diet ordered  Active Problems:   Hypertensive urgency Pt was given labetolol in ER but continues to have elevated BP. NTG paste placed by ER provider.  Continue lisinopril. Add Norvasc to regimen.  Monitor BP.  Pt with HR < 60 at this time so would hold beta blocker therapy.  Hydralazine not given due to its increase in myocardial demand     Diabetes mellitus type II, uncontrolled  Check hemoglobin A1c. Monitor blood sugars with meals and at bedtime.  Sliding scale insulin  provide as needed for glycemic control. Diabetic education to be provided before discharge    Edema of both legs Diuresis with Lasix. Monitor fluid volume    Dyspnea Supplemental oxygen as needed to keep O2 sat between 92 to 96% Secondary to CHF exacerbation with pulmonary edema.    DVT prophylaxis: Heparin for DVT prophylaxis.  Code Status:   Full code  Family Communication:  No family at bedside. Pt verbalized understanding of plan.  Further recommendations to follow as clinically indicated Disposition Plan:   Patient is from:  Home  Anticipated DC to:  Home  Anticipated DC date:  Anticipate more than 2 midnight stay in the hospital to treat acute condition  Anticipated DC barriers: No barriers to discharge identified at this time   Admission status:  Inpatient   Yevonne Aline Sollie Vultaggio MD [facility]  How to contact the [facility] Attending or Consulting provider [place] or covering provider during after hours [place], for this patient?   1. Check the care team in [facility] and look for a) attending/consulting TR[facility] provider listed and b) the [facility] team listed 2. Log into [email or web address] and use [place]'s universal password to access. If you do not have the password, please contact the hospital operator. 3. Locate the [facility] provider you are looking for under [facility] and page to a number that you can be directly reached. 4. If you still have difficulty reaching the provider, please page the [facility] (Director on Call) for the Hospitalists listed on amion for assistance.  05/25/2020, 3:25 AM

## 2020-05-25 NOTE — ED Notes (Signed)
Informed MD that patient is still hypertensive at this time. Arrien

## 2020-05-25 NOTE — Progress Notes (Addendum)
PROGRESS NOTE    Meghan Ford  BDZ:[id number] DOB: [birth year] DOA: 05/24/2020 PCP: Hoy Register, MD    Brief Narrative:  Meghan Ford was admitted to the hospital working diagnosis of acute diastolic heart failure exacerbation.  80 year old female past medical history for hypertension, type II [place] mellitus, diastolic heart failure, dyslipidemia, peripheral vascular disease and history of CVA who presented with dyspnea along with lower extremity edema.  Reported 7 days of worsening of extremity edema, dyspnea and orthopnea.  On her initial physical examination blood pressure 200/170, heart rate 56, respiratory rate 20, oxygen saturation 95%. Her lungs have decreased breath sounds, positive rales bilaterally, no wheezing, heart S1-S2, present rhythmic, soft abdomen, positive 3+ pitting lower extremity edema.  Sodium 141, potassium 4.7, chloride 108, bicarb 26, glucose 107, BUN 19, creatinine 1.79, troponin I 28-29-21-14, BNP 157.  White count 8.1, hemoglobin 12.1, respiratory 9.4, platelets 258.  SARS COVID-19 negative.  Chest radiograph with hilar vascular congestion.  EKG 74 bpm, normal axis, normal intervals, sinus rhythm, no ST segment T wave changes.  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 Acute on chronic diastolic heart failure decompensation. Patient with improved dyspnea but not yet back to baseline, continue to have lower extremity edema.  Urine output documented 300 cc. Systolic blood pressure has been in the 190's.   Continue diuresis with furosemide 40 mg Iv q12,  Holding B blocker due to risk of bradycardia.  Follow on  echocardiogram and further response to diuresis.   2. Hypertensive emergency. Blood pressure has been trending down through the course of the day. Now is 164/71 mmHg.  Continue blood pressure control with amlodipine (increase dose to 10 mg), hold on lisinopril due to AKI and will start patient on hydralazine.   3. AKI on CKD stage 3b. Renal function with serum cr at 1,69 this am, will order complete panel to follow up on K and bicarbonate.  Hold on lisinopril for now until GFR more stable.  Follow up renal function in am, avoid hypotension and nephrotoxic medications.   4. T2DM/ dyslipidemia. Continue glucose control with insulin sliding scale. Random glucose yesterday 107. Patient is tolerating po well.   Continue with atorvastatin.   5. Osteoarthritis. Add topical diclofenac to right knee pain. Will need PT and OT evaluation during this hospitalization.   Continue diuresis with furosemide   6. Obesity class 2 to 3, calculated BMI is 39,1.   7. Hx of CVA. Continue with statin and aspirin.    Patient continue to be at high risk for worsening heart failure   Status is: Inpatient  Remains inpatient appropriate because:IV treatments appropriate due to intensity of illness or inability to take PO   Dispo: The patient is from: Home              Anticipated d/c is to: Home              Patient currently is not medically stable to d/c.   Difficult to place patient No    DVT prophylaxis: Heparin   Code Status:   full  Family Communication:  I spoke with patient's daughter at the bedside, we talked  in detail about patient's condition, plan of care and prognosis and all questions were addressed.       Subjective: Patient continue to have dyspnea, improved but not yet back to baseline, positive right knee pain, worse with movement.   Objective: Vitals:   05/25/20 1200 05/25/20 1230 05/25/20 1300 05/25/20 1330  BP: (!) 178/83 (!) 190/83 (!) 192/83 (!) 198/70  Pulse: (!) 56  (!) 54 71 65  Resp: 18 19 18  (!) 22  Temp:    98.3 F (36.8 C)  TempSrc:    Oral  SpO2: 97% 98% 98% 100%    Intake/Output Summary (Last 24 hours) at 05/25/2020 1555 Last data filed at 05/25/2020 0041 Gross per 24 hour  Intake -  Output 300 ml  Net -300 ml   There were no vitals filed for this visit.  Examination:   General: Not in pain or dyspnea, deconditioned  Neurology: Awake and alert, non focal  E ENT: no pallor, no icterus, oral mucosa moist Cardiovascular: No JVD. S1-S2 present, rhythmic, no gallops, rubs, or murmurs. +++ pitting  lower extremity edema up to the hips. Pulmonary: positive breath sounds bilaterally, with no wheezing or rhonchi scattered rales bilateral on anterior ausculatation. Gastrointestinal. Abdomen protuberant, soft and non tender Skin. No rashes Musculoskeletal: hypertrophic bilateral knees, tender to palpation to the right, no erythema or local increase temperature.      Data Reviewed: I have personally reviewed following labs and imaging studies  CBC: Recent Labs  Lab 05/24/20 1731 05/25/20 0441  WBC 8.1 7.4  HGB 12.1 10.2*  HCT 39.4 33.8*  MCV 84.4 85.1  PLT 258 241   Basic Metabolic Panel: Recent Labs  Lab 05/24/20 1731 05/25/20 0441  NA 141  --   K 4.7  --   CL 108  --   CO2 26  --   GLUCOSE 107*  --   BUN 19  --   CREATININE 1.75* 1.69*  CALCIUM 8.5*  --    GFR: CrCl cannot be calculated (Unknown ideal weight.). Liver Function Tests: No results for input(s): AST, ALT, ALKPHOS, BILITOT, PROT, ALBUMIN in the last 168 hours. No results for input(s): LIPASE, AMYLASE in the last 168 hours. No results for input(s): AMMONIA in the last 168 hours. Coagulation Profile: No results for input(s): INR, PROTIME in the last 168 hours. Cardiac Enzymes: No results for input(s): CKTOTAL, CKMB, CKMBINDEX, TROPONINI in the last 168 hours. BNP (last 3 results) No results for input(s): PROBNP in the last 8760 hours. HbA1C: Recent Labs     05/25/20 0441  HGBA1C 6.8*   CBG: Recent Labs  Lab 05/25/20 0751 05/25/20 1207  GLUCAP 172* 84   Lipid Profile: No results for input(s): CHOL, HDL, LDLCALC, TRIG, CHOLHDL, LDLDIRECT in the last 72 hours. Thyroid Function Tests: No results for input(s): TSH, T4TOTAL, FREET4, T3FREE, THYROIDAB in the last 72 hours. Anemia Panel: No results for input(s): VITAMINB12, FOLATE, FERRITIN, TIBC, IRON, RETICCTPCT in the last 72 hours.    Radiology Studies: I have reviewed all of the imaging during this hospital visit personally     Scheduled Meds: . amLODipine  5 mg Oral Daily  . aspirin EC  325 mg Oral Daily  . atorvastatin  80 mg Oral Daily  . furosemide  40 mg Intravenous Q12H  . heparin  5,000 Units Subcutaneous Q8H  . insulin aspart  0-15 Units Subcutaneous TID WC  . insulin aspart  0-5 Units Subcutaneous QHS  . isosorbide mononitrate  60 mg  Oral Daily  . lisinopril  20 mg Oral Daily  . potassium chloride  20 mEq Oral BID  . sodium chloride flush  3 mL Intravenous Q12H   Continuous Infusions: . sodium chloride       LOS: 0 days        Mauricio Annett Gula, MD

## 2020-05-25 NOTE — ED Notes (Signed)
Provider page about pt bp.

## 2020-05-26 DIAGNOSIS — I161 Hypertensive emergency: Secondary | ICD-10-CM

## 2020-05-26 LAB — CBC WITH DIFFERENTIAL/PLATELET
Abs Immature Granulocytes: 0.03 10*3/uL (ref 0.00–0.07)
Basophils Absolute: 0 10*3/uL (ref 0.0–0.1)
Basophils Relative: 0 %
Eosinophils Absolute: 0.2 10*3/uL (ref 0.0–0.5)
Eosinophils Relative: 3 %
HCT: 38.6 % (ref 36.0–46.0)
Hemoglobin: 11.9 g/dL — ABNORMAL LOW (ref 12.0–15.0)
Immature Granulocytes: 0 %
Lymphocytes Relative: 31 %
Lymphs Abs: 2.4 10*3/uL (ref 0.7–4.0)
MCH: 25.4 pg — ABNORMAL LOW (ref 26.0–34.0)
MCHC: 30.8 g/dL (ref 30.0–36.0)
MCV: 82.5 fL (ref 80.0–100.0)
Monocytes Absolute: 0.4 10*3/uL (ref 0.1–1.0)
Monocytes Relative: 5 %
Neutro Abs: 4.8 10*3/uL (ref 1.7–7.7)
Neutrophils Relative %: 61 %
Platelets: 252 10*3/uL (ref 150–400)
RBC: 4.68 MIL/uL (ref 3.87–5.11)
RDW: 15.8 % — ABNORMAL HIGH (ref 11.5–15.5)
WBC: 7.9 10*3/uL (ref 4.0–10.5)
nRBC: 0 % (ref 0.0–0.2)

## 2020-05-26 LAB — GLUCOSE, CAPILLARY
Glucose-Capillary: 100 mg/dL — ABNORMAL HIGH (ref 70–99)
Glucose-Capillary: 97 mg/dL (ref 70–99)
Glucose-Capillary: 139 mg/dL — ABNORMAL HIGH (ref 70–99)
Glucose-Capillary: 122 mg/dL — ABNORMAL HIGH (ref 70–99)
Glucose-Capillary: 134 mg/dL — ABNORMAL HIGH (ref 70–99)

## 2020-05-26 LAB — RETICULOCYTES
Immature Retic Fract: 13.5 % (ref 2.3–15.9)
RBC.: 4.64 MIL/uL (ref 3.87–5.11)
Retic Count, Absolute: 64 10*3/uL (ref 19.0–186.0)
Retic Ct Pct: 1.4 % (ref 0.4–3.1)

## 2020-05-26 LAB — IRON AND TIBC
Iron: 61 ug/dL (ref 28–170)
Saturation Ratios: 24 % (ref 10.4–31.8)
TIBC: 251 ug/dL (ref 250–450)
UIBC: 190 ug/dL

## 2020-05-26 LAB — BASIC METABOLIC PANEL
Anion gap: 8 (ref 5–15)
BUN: 20 mg/dL (ref 8–23)
CO2: 27 mmol/L (ref 22–32)
Calcium: 8 mg/dL — ABNORMAL LOW (ref 8.9–10.3)
Chloride: 105 mmol/L (ref 98–111)
Creatinine, Ser: 1.71 mg/dL — ABNORMAL HIGH (ref 0.44–1.00)
GFR, Estimated: 30 mL/min — ABNORMAL LOW (ref >60)
Glucose, Bld: 103 mg/dL — ABNORMAL HIGH (ref 70–99)
Potassium: 4.1 mmol/L (ref 3.5–5.1)
Sodium: 140 mmol/L (ref 135–145)

## 2020-05-26 LAB — FOLATE: Folate: 16.5 ng/mL (ref >5.9)

## 2020-05-26 LAB — FERRITIN: Ferritin: 51 ng/mL (ref 11–307)

## 2020-05-26 LAB — VITAMIN B12: Vitamin B-12: 223 pg/mL (ref 180–914)

## 2020-05-26 MED ORDER — HYDRALAZINE HCL 50 MG PO TABS
75.0000 mg | ORAL_TABLET | Freq: Three times a day (TID) | ORAL | Status: DC
  Administered 2020-05-26 – 2020-05-27 (×2): 75 mg via ORAL
  Filled 2020-05-26 (×2): qty 1

## 2020-05-26 MED ORDER — ASPIRIN EC 81 MG PO TBEC
81.0000 mg | DELAYED_RELEASE_TABLET | Freq: Every day | ORAL | Status: DC
  Administered 2020-05-26 – 2020-05-30 (×5): 81 mg via ORAL
  Filled 2020-05-26 (×5): qty 1

## 2020-05-26 MED ORDER — HYDRALAZINE HCL 25 MG PO TABS: 25.0000 mg | ORAL_TABLET | Freq: Once | ORAL | Status: DC

## 2020-05-26 MED ORDER — CLOPIDOGREL BISULFATE 75 MG PO TABS
75.0000 mg | ORAL_TABLET | Freq: Every day | ORAL | Status: DC
  Administered 2020-05-26 – 2020-05-30 (×5): 75 mg via ORAL
  Filled 2020-05-26 (×5): qty 1

## 2020-05-26 MED ORDER — FUROSEMIDE 10 MG/ML IJ SOLN
40.0000 mg | Freq: Two times a day (BID) | INTRAMUSCULAR | Status: DC
  Administered 2020-05-27 – 2020-05-28 (×3): 40 mg via INTRAVENOUS
  Filled 2020-05-26 (×3): qty 4

## 2020-05-26 MED ORDER — DICLOFENAC SODIUM 1 % EX GEL
2.0000 g | Freq: Three times a day (TID) | CUTANEOUS | Status: DC
  Administered 2020-05-26 – 2020-05-30 (×9): 2 g via TOPICAL
  Filled 2020-05-26 (×2): qty 100

## 2020-05-26 NOTE — Progress Notes (Signed)
PROGRESS NOTE    Meghan Ford  GNF:[id number] DOB: Jan 27, 1941 DOA: 05/24/2020 PCP: Hoy RegisterNewlin, Enobong, MD    Chief Complaint  Patient presents with  . Shortness of Breath  . Edema    Brief Narrative:  Mrs. Meghan Ford was admitted to the hospital working diagnosis of acute diastolic heart failure exacerbation.  80 year old female past medical history for hypertension, type II diabetes mellitus, diastolic heart failure, dyslipidemia, peripheral vascular disease and history of CVA who presented with dyspnea along with lower extremity edema.  Reported 7 days of worsening of extremity edema, dyspnea and orthopnea.  On her initial physical examination blood pressure 200/170, heart rate 56, respiratory rate 20, oxygen saturation 95%. Her lungs have decreased breath sounds, positive rales bilaterally, no wheezing, heart S1-S2, present rhythmic, soft abdomen, positive 3+ pitting lower extremity edema.  Sodium 141, potassium 4.7, chloride 108, bicarb 26, glucose 107, BUN 19, creatinine 1.79, troponin I 28-29-21-14, BNP 157.  White count 8.1, hemoglobin 12.1, respiratory 9.4, platelets 258.  SARS COVID-19 negative.  Chest radiograph with hilar vascular congestion.  EKG 74 bpm, normal axis, normal intervals, sinus rhythm, no ST segment T wave changes.   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  EKG done with normal axis, normal sinus rhythm, no ischemic changes noted. -2D  echo with EF 60 to 65%,NWMA, grade 1 DD, elevated left atrial pressure, normal pulmonary artery systolic pressure. -Patient with some clinical improvement. -Urine output of 750 cc over the past 24 hours however unsure how accurate this is. -Current weight of 101.5 kg from 106.7 kg (3/15). -Continue Lasix 40 mg IV every 12 hours, Norvasc, hydralazine, statin, aspirin, Imdur. -Beta-blocker on hold due to concern for bradycardia. -Strict I's and O's.  Daily weights.  2.  Hypertensive emergency Patient presented with systolic blood pressures in the 200s, and acute on chronic heart failure. -Systolic blood pressures in the 190s this morning. -Continue Imdur, Norvasc. -Increase hydralazine to 75 mg 3 times daily. -Beta-blocker on hold due to concern for bradycardia.  3.  Acute kidney injury on chronic kidney disease stage IIIb Likely secondary to problem #1.  Patient with good urine output with diuresis.  Urine output of 750 cc over the past 24 hours. -Continue to hold lisinopril. -Creatinine trending back down. -Monitor with diuretics.  4.  Diabetes mellitus type 2/hyperlipidemia Hemoglobin A1c 6.8. -CBG 97 this morning. -Continue sliding scale insulin. -Continue statin  5.  Osteoarthritis Continue topical diclofenac to right knee.  PT/OT.  6.  History of CVA Continue aspirin, Plavix, statin.  Risk factor modification.  7.  Obesity class II-III Lifestyle modification.  Outpatient follow-up.  DVT prophylaxis: Heparin Code Status: Full Family Communication: Updated patient.  No family at bedside. Disposition:   Status is: Inpatient    Dispo: The patient is from: Home              Anticipated d/c is to: Home              Patient currently with hypertensive urgency/emergency, and  acute CHF exacerbation/volume overloaded, on IV diuretics.  Not stable for discharge.   Difficult to place patient no       Consultants:   None  Procedures:   Chest x-ray 05/24/2020  2D  echo 05/25/2020    Antimicrobials:   None   Subjective: Patient states shortness of breath is improving.  Denies any chest pain.  States some diffuse abdominal pain which is improving.  Asking whether she is going to be able to go home today.  Objective: Vitals:   05/26/20 0333 05/26/20 0547 05/26/20 0730 05/26/20 1113  BP:  (!) 194/58 (!) 187/60 (!) 192/61  Pulse:  66 68 67  Resp:  18 18 20   Temp:  98.9 F (37.2 C) 98.1 F (36.7 C) (!) 97.5 F (36.4 C)  TempSrc:  Oral Oral Oral  SpO2:  97% 98% 98%  Weight: 101.5 kg     Height:        Intake/Output Summary (Last 24 hours) at 05/26/2020 1207 Last data filed at 05/26/2020 1006 Gross per 24 hour  Intake 240 ml  Output 1750 ml  Net -1510 ml   Filed Weights   05/25/20 1600 05/26/20 0333  Weight: 106.7 kg 101.5 kg    Examination:  General exam: Appears calm and comfortable  Respiratory system: Diffuse crackles.  No wheezing.  Fair air movement.   Cardiovascular system: Regular rate and rhythm no murmurs rubs or gallops.  1-2+ bilateral lower extremity edema.  Gastrointestinal system: Abdomen is nondistended, soft and nontender. No organomegaly or masses felt. Normal bowel sounds heard. Central nervous system: Alert and oriented. No focal neurological deficits. Extremities: Symmetric 5 x 5 power. Skin: No rashes, lesions or ulcers Psychiatry: Judgement and insight appear normal. Mood & affect appropriate.     Data Reviewed: I have personally reviewed following labs and imaging studies  CBC: Recent Labs  Lab 05/24/20 1731 05/25/20 0441 05/26/20 0823  WBC 8.1 7.4 7.9  NEUTROABS  --   --  4.8  HGB 12.1 10.2* 11.9*  HCT 39.4 33.8* 38.6  MCV 84.4 85.1 82.5  PLT 258 241 252    Basic Metabolic Panel: Recent Labs  Lab 05/24/20 1731 05/25/20 0441 05/25/20 1719 05/26/20 0405  NA 141  --  138 140  K 4.7  --  4.3 4.1  CL 108  --  106 105  CO2 26  --  25 27  GLUCOSE 107*  --  131* 103*  BUN 19  --  21 20   CREATININE 1.75* 1.69* 1.80* 1.71*  CALCIUM 8.5*  --  7.8* 8.0*    GFR: Estimated Creatinine Clearance: 32.5 mL/min (A) (by C-G formula based on SCr of 1.71 mg/dL (H)).  Liver Function Tests: No results for input(s): AST, ALT, ALKPHOS, BILITOT, PROT, ALBUMIN in the last 168 hours.  CBG: Recent Labs  Lab 05/25/20 2104 05/25/20 2352 05/26/20 0103 05/26/20 0630 05/26/20 1110  GLUCAP 138* 113* 100* 97 139*     Recent Results (from the past 240 hour(s))  SARS CORONAVIRUS 2 (TAT 6-24 HRS) Nasopharyngeal Nasopharyngeal Swab     Status: None   Collection Time: 05/25/20  2:57 AM   Specimen: Nasopharyngeal Swab  Result Value Ref Range Status   SARS Coronavirus 2 NEGATIVE NEGATIVE Final    Comment: (NOTE) SARS-CoV-2 target nucleic acids are NOT DETECTED.  The SARS-CoV-2 RNA is generally detectable in upper and lower respiratory specimens during the acute phase of infection. Negative results do not preclude SARS-CoV-2 infection, do not rule  out co-infections with other pathogens, and should not be used as the sole basis for treatment or other patient management decisions. Negative results must be combined with clinical observations, patient history, and epidemiological information. The expected result is Negative.  Fact Sheet for Patients: HairSlick.no  Fact Sheet for Healthcare Providers: quierodirigir.com  This test is not yet approved or cleared by the [place] FDA and  has been authorized for detection and/or diagnosis of SARS-CoV-2 by FDA under an Emergency Use Authorization (EUA). This EUA will remain  in effect (meaning this test can be used) for the duration of the COVID-19 declaration under Se ction 564(b)(1) of the Act, 21 U.S.C. section 360bbb-3(b)(1), unless the authorization is terminated or revoked sooner.  Performed at [facility], [address] [place], [place] [address]           Radiology Studies: DG Chest 2 View  Result Date: 05/24/2020 CLINICAL DATA:  Shortness of breath and lower extremity swelling x3 days EXAM: CHEST - 2 VIEW COMPARISON:  Chest radiograph February 16, 2020. FINDINGS: Cardiac monitoring device overlies the left chest. Enlarged cardiac silhouette. Left basilar opacity with obscuration left hemidiaphragm. Small left pleural effusion. Left reverse shoulder arthroplasty. IMPRESSION: Left basilar opacity and small left pleural effusion. Electronically Signed   By: Maudry Mayhew MD   On: 05/24/2020 18:03   ECHOCARDIOGRAM COMPLETE  Result Date: 05/25/2020    ECHOCARDIOGRAM REPORT   Patient Name:   [facility] Date of Exam: 05/25/2020 Medical Rec #:  [id number]                 Height:       65.0 in Accession #:    [id number]                Weight:       227.4 lb Date of Birth:  1940/05/22                 BSA:          2.089 m Patient Age:    79 years                  BP:           163/92 mmHg Patient Gender: F                         HR:           58 bpm. Exam Location:  Inpatient Procedure: 2D Echo, Cardiac Doppler and Color Doppler Indications:    I50.22 Chronic systolic (congestive) heart failure  History:        Patient has prior history of Echocardiogram examinations, most                 recent 05/09/2017. Stroke; Risk Factors:Hypertension, Diabetes                 and Dyslipidemia. Sepsis.  Sonographer:    Elmarie Shiley Dance Referring Phys: [id number] BRADLEY S CHOTINER IMPRESSIONS  1. Left ventricular ejection fraction, by estimation, is 60 to 65%. The left ventricle has normal function. The left ventricle has no regional wall motion abnormalities. Left ventricular diastolic parameters are consistent with Grade I diastolic dysfunction (impaired relaxation). Elevated left atrial pressure.  2. Right ventricular systolic function is normal. The right ventricular size is normal. There is normal pulmonary artery systolic pressure. The estimated right  ventricular systolic pressure is 31.5 mmHg.  3. Left atrial size  was moderately dilated.  4. The mitral valve is normal in structure. Trivial mitral valve regurgitation. No evidence of mitral stenosis.  5. The aortic valve is normal in structure. Aortic valve regurgitation is not visualized. Mild aortic valve sclerosis is present, with no evidence of aortic valve stenosis.  6. The inferior vena cava is dilated in size with >50% respiratory variability, suggesting right atrial pressure of 8 mmHg. Comparison(s): No significant change from prior study. Prior images reviewed side by side. FINDINGS  Left Ventricle: Left ventricular ejection fraction, by estimation, is 60 to 65%. The left ventricle has normal function. The left ventricle has no regional wall motion abnormalities. The left ventricular internal cavity size was normal in size. There is  no left ventricular hypertrophy. Left ventricular diastolic parameters are consistent with Grade I diastolic dysfunction (impaired relaxation). Elevated left atrial pressure. Right Ventricle: The right ventricular size is normal. No increase in right ventricular wall thickness. Right ventricular systolic function is normal. There is normal pulmonary artery systolic pressure. The tricuspid regurgitant velocity is 2.42 m/s, and  with an assumed right atrial pressure of 8 mmHg, the estimated right ventricular systolic pressure is 31.5 mmHg. Left Atrium: Left atrial size was moderately dilated. Right Atrium: Right atrial size was normal in size. Pericardium: There is no evidence of pericardial effusion. Mitral Valve: The mitral valve is normal in structure. Mild to moderate mitral annular calcification. Trivial mitral valve regurgitation. No evidence of mitral valve stenosis. Tricuspid Valve: The tricuspid valve is normal in structure. Tricuspid valve regurgitation is not demonstrated. No evidence of tricuspid stenosis. Aortic Valve: The aortic valve is normal in structure.  Aortic valve regurgitation is not visualized. Aortic regurgitation PHT measures 710 msec. Mild aortic valve sclerosis is present, with no evidence of aortic valve stenosis. Pulmonic Valve: The pulmonic valve was normal in structure. Pulmonic valve regurgitation is not visualized. No evidence of pulmonic stenosis. Aorta: The aortic root is normal in size and structure. Venous: The inferior vena cava is dilated in size with greater than 50% respiratory variability, suggesting right atrial pressure of 8 mmHg. IAS/Shunts: No atrial level shunt detected by color flow Doppler.  LEFT VENTRICLE PLAX 2D LVIDd:         4.10 cm  Diastology LVIDs:         3.00 cm  LV e' medial:    5.59 cm/s LV PW:         1.50 cm  LV E/e' medial:  15.4 LV IVS:        1.30 cm  LV e' lateral:   5.78 cm/s LVOT diam:     2.30 cm  LV E/e' lateral: 14.9 LV SV:         137 LV SV Index:   65 LVOT Area:     4.15 cm  RIGHT VENTRICLE             IVC RV Basal diam:  2.70 cm     IVC diam: 2.40 cm RV S prime:     13.10 cm/s TAPSE (M-mode): 2.0 cm LEFT ATRIUM             Index       RIGHT ATRIUM           Index LA diam:        4.80 cm 2.30 cm/m  RA Area:     11.30 cm LA Vol (A2C):   87.1 ml 41.69 ml/m RA Volume:   23.40 ml  11.20 ml/m LA Vol (  A4C):   56.8 ml 27.19 ml/m LA Biplane Vol: 70.4 ml 33.70 ml/m  AORTIC VALVE LVOT Vmax:   117.00 cm/s LVOT Vmean:  78.100 cm/s LVOT VTI:    0.329 m AI PHT:      710 msec  AORTA Ao Root diam: 3.50 cm Ao Asc diam:  3.60 cm MITRAL VALVE                TRICUSPID VALVE MV Area (PHT): 2.13 cm     TR Peak grad:   23.5 mmHg MV Decel Time: 356 msec     TR Vmax:        242.16 cm/s MV E velocity: 86.20 cm/s MV A velocity: 109.00 cm/s  SHUNTS MV E/A ratio:  0.79         Systemic VTI:  0.33 m                             Systemic Diam: 2.30 cm Rachelle Hora Croitoru MD Electronically signed by Thurmon Fair MD Signature Date/Time: 05/25/2020/1:02:42 PM    Final         Scheduled Meds: . amLODipine  10 mg Oral Daily  . aspirin  EC  81 mg Oral Daily  . atorvastatin  80 mg Oral Daily  . clopidogrel  75 mg Oral Daily  . diclofenac Sodium  2 g Topical TID  . furosemide  40 mg Intravenous Q12H  . heparin  5,000 Units Subcutaneous Q8H  . hydrALAZINE  50 mg Oral Q8H  . insulin aspart  0-15 Units Subcutaneous TID WC  . insulin aspart  0-5 Units Subcutaneous QHS  . isosorbide mononitrate  60 mg Oral Daily  . pneumococcal 23 valent vaccine  0.5 mL Intramuscular Tomorrow-1000  . sodium chloride flush  3 mL Intravenous Q12H   Continuous Infusions: . sodium chloride       LOS: 1 day    Time spent: 40 minutes    Ramiro Harvest, MD [facility]   To contact the attending provider between 7A-7P or the covering provider during after hours 7P-7A, please log into the web site [email or web address] and access using universal [place] password for that web site. If you do not have the password, please call the hospital operator.  05/26/2020, 12:07 PM

## 2020-05-27 LAB — CBC
HCT: 35.6 % — ABNORMAL LOW (ref 36.0–46.0)
Hemoglobin: 11.5 g/dL — ABNORMAL LOW (ref 12.0–15.0)
MCH: 26.3 pg (ref 26.0–34.0)
MCHC: 32.3 g/dL (ref 30.0–36.0)
MCV: 81.5 fL (ref 80.0–100.0)
Platelets: 255 10*3/uL (ref 150–400)
RBC: 4.37 MIL/uL (ref 3.87–5.11)
RDW: 15.7 % — ABNORMAL HIGH (ref 11.5–15.5)
WBC: 7.6 10*3/uL (ref 4.0–10.5)
nRBC: 0 % (ref 0.0–0.2)

## 2020-05-27 LAB — BASIC METABOLIC PANEL
Anion gap: 9 (ref 5–15)
BUN: 19 mg/dL (ref 8–23)
CO2: 28 mmol/L (ref 22–32)
Calcium: 8 mg/dL — ABNORMAL LOW (ref 8.9–10.3)
Chloride: 102 mmol/L (ref 98–111)
Creatinine, Ser: 1.72 mg/dL — ABNORMAL HIGH (ref 0.44–1.00)
GFR, Estimated: 30 mL/min — ABNORMAL LOW (ref >60)
Glucose, Bld: 131 mg/dL — ABNORMAL HIGH (ref 70–99)
Potassium: 3.7 mmol/L (ref 3.5–5.1)
Sodium: 139 mmol/L (ref 135–145)

## 2020-05-27 LAB — GLUCOSE, CAPILLARY
Glucose-Capillary: 117 mg/dL — ABNORMAL HIGH (ref 70–99)
Glucose-Capillary: 119 mg/dL — ABNORMAL HIGH (ref 70–99)
Glucose-Capillary: 124 mg/dL — ABNORMAL HIGH (ref 70–99)
Glucose-Capillary: 132 mg/dL — ABNORMAL HIGH (ref 70–99)
Glucose-Capillary: 165 mg/dL — ABNORMAL HIGH (ref 70–99)
Glucose-Capillary: 130 mg/dL — ABNORMAL HIGH (ref 70–99)

## 2020-05-27 MED ORDER — HYDRALAZINE HCL 50 MG PO TABS
100.0000 mg | ORAL_TABLET | Freq: Three times a day (TID) | ORAL | Status: DC
  Administered 2020-05-27 – 2020-05-30 (×9): 100 mg via ORAL
  Filled 2020-05-27 (×9): qty 2

## 2020-05-27 MED ORDER — HYDRALAZINE HCL 25 MG PO TABS
25.0000 mg | ORAL_TABLET | Freq: Once | ORAL | Status: AC
  Administered 2020-05-27: 25 mg via ORAL
  Filled 2020-05-27: qty 1

## 2020-05-27 MED ORDER — GABAPENTIN 300 MG PO CAPS
300.0000 mg | ORAL_CAPSULE | Freq: Every day | ORAL | Status: DC
  Administered 2020-05-27 – 2020-05-30 (×4): 300 mg via ORAL
  Filled 2020-05-27 (×4): qty 1

## 2020-05-27 NOTE — Progress Notes (Signed)
SATURATION QUALIFICATIONS: (This note is used to comply with regulatory documentation for home oxygen)  Patient Saturations on Room Air at Rest = 94%  Patient Saturations on Room Air while Ambulating = 93%   

## 2020-05-27 NOTE — Evaluation (Signed)
Occupational Therapy Evaluation Patient Details Name: Meghan Ford MRN: [id number] DOB: Oct 22, 1940 Today's Date: 05/27/2020    History of Present Illness Pt is a 80 y.o. female admitted 05/24/20 with dyspnea, BLE edema. Workup for acute on chronic diastolic HF, hypertensive urgency, AKI on CKD III. PMH includes CKD, HTN, DM2, OA, CVA, PVD, obesity; of note, recent admission 02/2020 with acute metabolic encephalopathy.   Clinical Impression   Pt is typically mod I for mobility with SPC/RW. She is mod I for ADL but family assists with IADL. Today initially she is very reluctant to move at all, but after a little education and encouragement she was more than willing to do anything that we asked. She was mod A for bed mobility, mod A +2 for functional transfers. She was able to maintain sitting balance for oral care and washing face, and max A for LB ADL at this time. Graciella, the spanish interpreter was very helpful throughout with clarifying and explaining to the Pt. At this time I am hopeful that she will progress, but currently feel as though SNF post-acute is necessary for safety and to maximize independence in ADL and functional transfers, OT will follow acutely with next session to focus on OOB activity and potentially standing grooming.     Follow Up Recommendations  SNF;Supervision/Assistance - 24 hour    Equipment Recommendations  Tub/shower seat    Recommendations for Other Services       Precautions / Restrictions Precautions Precautions: Fall;Other (comment) Precaution Comments: Urine incontinence Restrictions Weight Bearing Restrictions: No      Mobility Bed Mobility Overal bed mobility: Needs Assistance Bed Mobility: Supine to Sit     Supine to sit: Mod assist;HOB elevated     General bed mobility comments: Heavy modA to elevate trunk and scoot hips to EOB, cues to use bed rail; increased time and effort, pt reports limited by pain     Transfers Overall transfer level: Needs assistance Equipment used: Rolling walker (2 wheeled) Transfers: Stand Pivot Transfers;Sit to/from Stand Sit to Stand: Mod assist;+2 physical assistance Stand pivot transfers: Mod assist;+2 physical assistance;+2 safety/equipment       General transfer comment: ModA+2 to assist trunk elevation, stabilize RW and maintain balance; pt with increased time and effort, c/o BLE pain    Balance Overall balance assessment: Needs assistance   Sitting balance-Leahy Scale: Fair       Standing balance-Leahy Scale: Poor Standing balance comment: Reliant on UE support                           ADL either performed or assessed with clinical judgement   ADL Overall ADL's : Needs assistance/impaired Eating/Feeding: Set up;Sitting   Grooming: Wash/dry face;Oral care;Set up;Sitting Grooming Details (indicate cue type and reason): in recliner Upper Body Bathing: Moderate assistance;Sitting Upper Body Bathing Details (indicate cue type and reason): for back Lower Body Bathing: Moderate assistance Lower Body Bathing Details (indicate cue type and reason): assist for knees down Upper Body Dressing : Min guard;Sitting   Lower Body Dressing: Maximal assistance;Sitting/lateral leans Lower Body Dressing Details (indicate cue type and reason): to don socks EOB Toilet Transfer: Moderate assistance;+2 for physical assistance;+2 for safety/equipment;Stand-pivot;BSC;RW Toilet Transfer Details (indicate cue type and reason): heavy assist for boost into standing, cues for safety with RW Toileting- Clothing Manipulation and Hygiene: Maximal assistance;Sit to/from stand       Functional mobility during ADLs: Moderate assistance;+2 for physical assistance;+2 for safety/equipment;Cueing for sequencing;Cueing for  safety;Rolling walker General ADL Comments: VERY helpful to have Graciella (spanish interpreter) there to assist     Vision Patient Visual  Report: No change from baseline       Perception     Praxis      Pertinent Vitals/Pain Pain Assessment: Faces Faces Pain Scale: Hurts little more Pain Location: Bilateral feet ("because my sugar is low") Pain Descriptors / Indicators: Burning Pain Intervention(s): Monitored during session;Repositioned     Hand Dominance Left   Extremity/Trunk Assessment Upper Extremity Assessment Upper Extremity Assessment: Generalized weakness   Lower Extremity Assessment Lower Extremity Assessment: Generalized weakness       Communication Communication Communication: Interpreter utilized;Prefers language other than English Sales executive, in person interpreter)   Cognition Arousal/Alertness: Awake/alert Behavior During Therapy: WFL for tasks assessed/performed Overall Cognitive Status: Within Functional Limits for tasks assessed                                 General Comments: WFL for majority of simple tasks via Spanish interpreter; although providing inconsistent info regarding who she lives with, able to correct when asked clarifying question   General Comments  C/o nausea upon sitting EOB; BP 150/68, HR 76, SpO2 93% on RA    Exercises     Shoulder Instructions      Home Living Family/patient expects to be discharged to:: Private residence Living Arrangements: Children;Non-relatives/Friends Available Help at Discharge: Family;Friend(s);Available PRN/intermittently Type of Home: House Home Access: Level entry     Home Layout: One level     Bathroom Shower/Tub: Producer, television/film/video: Standard     Home Equipment: Cane - single point;Grab bars - tub/shower;Walker - 2 wheels;Bedside commode   Additional Comments: Lives with son (25 y.o.); also rents a room to a woman -- all persons work during the day. Multiple family members visit often. Reports she can potentially stay with daughter at d/c      Prior Functioning/Environment Level of  Independence: Independent with assistive device(s)  Gait / Transfers Assistance Needed: Mod indep with SPC. Later reports son helps pt get in/out of home and into car. 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, radio) ADL's / Homemaking Assistance Needed: Pt reports indep with ADL tasks   Comments: mod I for ADL but family assists with IADL        OT Problem List: Decreased strength;Decreased range of motion;Decreased activity tolerance;Impaired balance (sitting and/or standing);Decreased safety awareness;Decreased knowledge of precautions;Decreased knowledge of use of DME or AE;Pain;Increased edema      OT Treatment/Interventions: Self-care/ADL training;Therapeutic exercise;Energy conservation;DME and/or AE instruction;Therapeutic activities;Patient/family education;Balance training    OT Goals(Current goals can be found in the care plan section) Acute Rehab OT Goals Patient Stated Goal: Return home with family assist; wants to wait and see what doctor says about need for rehab OT Goal Formulation: With patient Time For Goal Achievement: 06/10/20 Potential to Achieve Goals: Good  OT Frequency: Min 2X/week   Barriers to D/C:            Co-evaluation PT/OT/SLP Co-Evaluation/Treatment: Yes Reason for Co-Treatment: For patient/therapist safety;To address functional/ADL transfers;Complexity of the patient's impairments (multi-system involvement) (use of spanish interpreter) PT goals addressed during session: Mobility/safety with mobility;Balance;Proper use of DME OT goals addressed during session: ADL's and self-care;Proper use of Adaptive equipment and DME      AM-PAC OT "6 Clicks" Daily Activity     Outcome Measure Help from another person eating meals?: None  Help from another person taking care of personal grooming?: A Little Help from another person toileting, which includes using toliet, bedpan, or urinal?: A Lot Help from another person bathing (including washing, rinsing, drying)?: A  Lot Help from another person to put on and taking off regular upper body clothing?: A Little Help from another person to put on and taking off regular lower body clothing?: A Lot 6 Click Score: 16   End of Session Equipment Utilized During Treatment: Gait belt;Back brace Nurse Communication: Mobility status;Precautions;Weight bearing status  Activity Tolerance: Patient tolerated treatment well Patient left: in chair;with call bell/phone within reach;with chair alarm set  OT Visit Diagnosis: Unsteadiness on feet (R26.81);Other abnormalities of gait and mobility (R26.89);Muscle weakness (generalized) (M62.81)                Time: 6811-5726 OT Time Calculation (min): 25 min Charges:  OT General Charges $OT Visit: 1 Visit OT Evaluation $OT Eval Moderate Complexity: 1 Mod  Nyoka Cowden OTR/L Acute Rehabilitation Services Pager: [phone] Office: [phone]  Evern Bio Loukas Antonson 05/27/2020, 12:41 PM

## 2020-05-27 NOTE — Progress Notes (Signed)
PROGRESS NOTE    Meghan Ford  WPY:[id number] DOB: Aug 02, 1940 DOA: 05/24/2020 PCP: Hoy Register, MD    Chief Complaint  Patient presents with   Shortness of Breath   Edema    Brief Narrative:  Meghan Ford was admitted to the hospital working diagnosis of acute diastolic heart failure exacerbation.  80 year old female past medical history for hypertension, type II diabetes mellitus, diastolic heart failure, dyslipidemia, peripheral vascular disease and history of CVA who presented with dyspnea along with lower extremity edema.  Reported 7 days of worsening of extremity edema, dyspnea and orthopnea.  On her initial physical examination blood pressure 200/170, heart rate 56, respiratory rate 20, oxygen saturation 95%. Her lungs have decreased breath sounds, positive rales bilaterally, no wheezing, heart S1-S2, present rhythmic, soft abdomen, positive 3+ pitting lower extremity edema.  Sodium 141, potassium 4.7, chloride 108, bicarb 26, glucose 107, BUN 19, creatinine 1.79, troponin I 28-29-21-14, BNP 157.  White count 8.1, hemoglobin 12.1, respiratory 9.4, platelets 258.  SARS COVID-19 negative.  Chest radiograph with hilar vascular congestion.  EKG 74 bpm, normal axis, normal intervals, sinus rhythm, no ST segment T wave changes.   Assessment & Plan:   Principal Problem:   Acute on chronic heart failure with preserved ejection fraction (HFpEF) (HCC) Active Problems:   Diabetes mellitus type II, uncontrolled (HCC)   Essential hypertension   Cerebrovascular accident (CVA) due to stenosis of right middle cerebral artery (HCC)   DM type 2 causing CKD stage 3 (HCC)   Class 3 obesity   Hypertensive emergency  1 acute on chronic diastolic heart failure Patient presented worsening shortness of breath, worsening lower extremity edema, elevated blood pressure with systolics on presentation in the 200s.  EKG done with normal axis, normal sinus rhythm, no  ischemic changes noted. -2D echo with EF 60 to 65%,NWMA, grade 1 DD, elevated left atrial pressure, normal pulmonary artery systolic pressure. -Patient with some clinical improvement. -Patient with urine output of 3 L over the past 24 hours. -Patient is -4 L during this hospitalization. -Current weight of 0.9 kg from 101.5 kg from 106.7 kg (3/15). -Continue Lasix 40 mg IV every 12 hours for another 24 hours and likely transition to oral diuretics. -Continue Norvasc, hydralazine, statin, aspirin, Imdur. -Beta-blocker on hold due to concern for bradycardia. -Strict I's and O's.  Daily weights.  2.  Hypertensive emergency Patient presented with systolic blood pressures in the 200s, and acute on chronic heart failure. -Systolic blood pressures still in the 190s this morning. -Discontinue nitro patch.  -Continue Norvasc 10 mg daily, Imdur 60 mg daily.  Increase hydralazine to 100 mg every 8 hours.   -Continue IV Lasix.   -Beta-blocker on hold due to concern for bradycardia.    3.  Acute kidney injury on chronic kidney disease stage IIIb Likely secondary to problem #1.  Patient with good urine output with diuresis.  -UOP = 3 L over the past 24 hours.  -Renal function stable.  -Continue to hold lisinopril.  -Follow on diuretics.    4.  Diabetes mellitus type 2 with neuropathy/hyperlipidemia Hemoglobin A1c 6.8. -CBG 119 this morning.   -Continue SSI.   -Resume home regimen Neurontin.   -Continue statin.  5.  Osteoarthritis Continue topical diclofenac to right knee.  PT/OT.  6.  History of CVA Stable.  Continue home regimen of aspirin, Plavix, statin.  Risk factor modification.  7.  Obesity class II-III Lifestyle modification.  Outpatient follow-up.  DVT prophylaxis: Heparin Code Status: Full Family  Communication: Updated patient via Spanish interpreter.  No family at bedside.   Disposition:   Status is: Inpatient    Dispo: The patient is from: Home              Anticipated  d/c is to: Home              Patient currently with hypertensive urgency/emergency, and acute CHF exacerbation/volume overloaded, on IV diuretics.  Not stable for discharge.   Difficult to place patient no       Consultants:   None  Procedures:   Chest x-ray 05/24/2020  2D echo 05/25/2020    Antimicrobials:   None   Subjective: Patient Laying in bed.  States shortness of breath has improved.  Denies any chest pain.  Was complaining of pain in the feet this morning which have started to improve after receiving gabapentin.  Feeling much better than on admission.  Objective: Vitals:   05/27/20 0349 05/27/20 0826 05/27/20 1029 05/27/20 1150  BP: (!) 195/74 (!) 153/113 (!) 154/59 (!) 155/58  Pulse: 76 72 76 73  Resp: 20 18 18 16   Temp: 98.4 F (36.9 C) 98.5 F (36.9 C) 98.5 F (36.9 C) 98.2 F (36.8 C)  TempSrc: Oral Oral Oral Oral  SpO2: 97% 98% 94% 97%  Weight: 99.9 kg     Height:        Intake/Output Summary (Last 24 hours) at 05/27/2020 1201 Last data filed at 05/27/2020 0841 Gross per 24 hour  Intake 120 ml  Output 2650 ml  Net -2530 ml   Filed Weights   05/25/20 1600 05/26/20 0333 05/27/20 0349  Weight: 106.7 kg 101.5 kg 99.9 kg    Examination:  General exam: NAD Respiratory system: Bibasilar crackles.  No wheezing.  Fair air movement.  Speaking in full sentences.    Cardiovascular system: RRR no murmurs rubs or gallops.  Trace to 1+ bilateral lower extremity edema.  Gastrointestinal system: Abdomen is soft, nondistended, nontender, positive bowel sounds.  No rebound.  No guarding.   Central nervous system: Alert and oriented.  No focal neurological deficits.   Extremities: Symmetric 5 x 5 power. Skin: No rashes, lesions or ulcers Psychiatry: Judgement and insight appear normal. Mood & affect appropriate.     Data Reviewed: I have personally reviewed following labs and imaging studies  CBC: Recent Labs  Lab 05/24/20 1731 05/25/20 0441  05/26/20 0823 05/27/20 0316  WBC 8.1 7.4 7.9 7.6  NEUTROABS  --   --  4.8  --   HGB 12.1 10.2* 11.9* 11.5*  HCT 39.4 33.8* 38.6 35.6*  MCV 84.4 85.1 82.5 81.5  PLT 258 241 252 255    Basic Metabolic Panel: Recent Labs  Lab 05/24/20 1731 05/25/20 0441 05/25/20 1719 05/26/20 0405 05/27/20 0316  NA 141  --  138 140 139  K 4.7  --  4.3 4.1 3.7  CL 108  --  106 105 102  CO2 26  --  25 27 28   GLUCOSE 107*  --  131* 103* 131*  BUN 19  --  21 20 19   CREATININE 1.75* 1.69* 1.80* 1.71* 1.72*  CALCIUM 8.5*  --  7.8* 8.0* 8.0*    GFR: Estimated Creatinine Clearance: 32.1 mL/min (A) (by C-G formula based on SCr of 1.72 mg/dL (H)).  Liver Function Tests: No results for input(s): AST, ALT, ALKPHOS, BILITOT, PROT, ALBUMIN in the last 168 hours.  CBG: Recent Labs  Lab 05/26/20 2111 05/27/20 0116 05/27/20 2112 05/27/20 1031  05/27/20 1148  GLUCAP 134* 117* 119* 124* 132*     Recent Results (from the past 240 hour(s))  SARS CORONAVIRUS 2 (TAT 6-24 HRS) Nasopharyngeal Nasopharyngeal Swab     Status: None   Collection Time: 05/25/20  2:57 AM   Specimen: Nasopharyngeal Swab  Result Value Ref Range Status   SARS Coronavirus 2 NEGATIVE NEGATIVE Final    Comment: (NOTE) SARS-CoV-2 target nucleic acids are NOT DETECTED.  The SARS-CoV-2 RNA is generally detectable in upper and lower respiratory specimens during the acute phase of infection. Negative results do not preclude SARS-CoV-2 infection, do not rule out co-infections with other pathogens, and should not be used as the sole basis for treatment or other patient management decisions. Negative results must be combined with clinical observations, patient history, and epidemiological information. The expected result is Negative.  Fact Sheet for Patients: HairSlick.no  Fact Sheet for Healthcare Providers: quierodirigir.com  This test is not yet approved or cleared by the  [place] FDA and  has been authorized for detection and/or diagnosis of SARS-CoV-2 by FDA under an Emergency Use Authorization (EUA). This EUA will remain  in effect (meaning this test can be used) for the duration of the COVID-19 declaration under Se ction 564(b)(1) of the Act, 21 U.S.C. section 360bbb-3(b)(1), unless the authorization is terminated or revoked sooner.  Performed at [facility], [address] [place], [place] [address]          Radiology Studies: ECHOCARDIOGRAM COMPLETE  Result Date: 05/25/2020    ECHOCARDIOGRAM REPORT   Patient Name:   Meghan Ford Date of Exam: 05/25/2020 Medical Rec #:  [id number]                 Height:       65.0 in Accession #:    [id number]                Weight:       227.4 lb Date of Birth:  28-Aug-1940                 BSA:          2.089 m Patient Age:    79 years                  BP:           163/92 mmHg Patient Gender: F                         HR:           58 bpm. Exam Location:  Inpatient Procedure: 2D Echo, Cardiac Doppler and Color Doppler Indications:    I50.22 Chronic systolic (congestive) heart failure  History:        Patient has prior history of Echocardiogram examinations, most                 recent 05/09/2017. Stroke; Risk Factors:Hypertension, Diabetes                 and Dyslipidemia. Sepsis.  Sonographer:    Elmarie Shiley Dance Referring Phys: [id number] BRADLEY S CHOTINER IMPRESSIONS  1. Left ventricular ejection fraction, by estimation, is 60 to 65%. The left ventricle has normal function. The left ventricle has no regional wall motion abnormalities. Left ventricular diastolic parameters are consistent with Grade I diastolic dysfunction (impaired relaxation). Elevated left atrial pressure.  2. Right ventricular systolic function is normal. The right ventricular size is normal. There  is normal pulmonary artery systolic pressure. The estimated right ventricular systolic pressure is 31.5 mmHg.  3. Left atrial size was  moderately dilated.  4. The mitral valve is normal in structure. Trivial mitral valve regurgitation. No evidence of mitral stenosis.  5. The aortic valve is normal in structure. Aortic valve regurgitation is not visualized. Mild aortic valve sclerosis is present, with no evidence of aortic valve stenosis.  6. The inferior vena cava is dilated in size with >50% respiratory variability, suggesting right atrial pressure of 8 mmHg. Comparison(s): No significant change from prior study. Prior images reviewed side by side. FINDINGS  Left Ventricle: Left ventricular ejection fraction, by estimation, is 60 to 65%. The left ventricle has normal function. The left ventricle has no regional wall motion abnormalities. The left ventricular internal cavity size was normal in size. There is  no left ventricular hypertrophy. Left ventricular diastolic parameters are consistent with Grade I diastolic dysfunction (impaired relaxation). Elevated left atrial pressure. Right Ventricle: The right ventricular size is normal. No increase in right ventricular wall thickness. Right ventricular systolic function is normal. There is normal pulmonary artery systolic pressure. The tricuspid regurgitant velocity is 2.42 m/s, and  with an assumed right atrial pressure of 8 mmHg, the estimated right ventricular systolic pressure is 31.5 mmHg. Left Atrium: Left atrial size was moderately dilated. Right Atrium: Right atrial size was normal in size. Pericardium: There is no evidence of pericardial effusion. Mitral Valve: The mitral valve is normal in structure. Mild to moderate mitral annular calcification. Trivial mitral valve regurgitation. No evidence of mitral valve stenosis. Tricuspid Valve: The tricuspid valve is normal in structure. Tricuspid valve regurgitation is not demonstrated. No evidence of tricuspid stenosis. Aortic Valve: The aortic valve is normal in structure. Aortic valve regurgitation is not visualized. Aortic regurgitation PHT  measures 710 msec. Mild aortic valve sclerosis is present, with no evidence of aortic valve stenosis. Pulmonic Valve: The pulmonic valve was normal in structure. Pulmonic valve regurgitation is not visualized. No evidence of pulmonic stenosis. Aorta: The aortic root is normal in size and structure. Venous: The inferior vena cava is dilated in size with greater than 50% respiratory variability, suggesting right atrial pressure of 8 mmHg. IAS/Shunts: No atrial level shunt detected by color flow Doppler.  LEFT VENTRICLE PLAX 2D LVIDd:         4.10 cm  Diastology LVIDs:         3.00 cm  LV e' medial:    5.59 cm/s LV PW:         1.50 cm  LV E/e' medial:  15.4 LV IVS:        1.30 cm  LV e' lateral:   5.78 cm/s LVOT diam:     2.30 cm  LV E/e' lateral: 14.9 LV SV:         137 LV SV Index:   65 LVOT Area:     4.15 cm  RIGHT VENTRICLE             IVC RV Basal diam:  2.70 cm     IVC diam: 2.40 cm RV S prime:     13.10 cm/s TAPSE (M-mode): 2.0 cm LEFT ATRIUM             Index       RIGHT ATRIUM           Index LA diam:        4.80 cm 2.30 cm/m  RA Area:     11.30  cm LA Vol (A2C):   87.1 ml 41.69 ml/m RA Volume:   23.40 ml  11.20 ml/m LA Vol (A4C):   56.8 ml 27.19 ml/m LA Biplane Vol: 70.4 ml 33.70 ml/m  AORTIC VALVE LVOT Vmax:   117.00 cm/s LVOT Vmean:  78.100 cm/s LVOT VTI:    0.329 m AI PHT:      710 msec  AORTA Ao Root diam: 3.50 cm Ao Asc diam:  3.60 cm MITRAL VALVE                TRICUSPID VALVE MV Area (PHT): 2.13 cm     TR Peak grad:   23.5 mmHg MV Decel Time: 356 msec     TR Vmax:        242.16 cm/s MV E velocity: 86.20 cm/s MV A velocity: 109.00 cm/s  SHUNTS MV E/A ratio:  0.79         Systemic VTI:  0.33 m                             Systemic Diam: 2.30 cm Mihai Croitoru MD Electronically signed by Thurmon Fair MD Signature Date/Time: 05/25/2020/1:02:42 PM    Final         Scheduled Meds:  amLODipine  10 mg Oral Daily   aspirin EC  81 mg Oral Daily   atorvastatin  80 mg Oral Daily   clopidogrel   75 mg Oral Daily   diclofenac Sodium  2 g Topical TID   furosemide  40 mg Intravenous Q12H   gabapentin  300 mg Oral Daily   heparin  5,000 Units Subcutaneous Q8H   hydrALAZINE  100 mg Oral Q8H   insulin aspart  0-15 Units Subcutaneous TID WC   insulin aspart  0-5 Units Subcutaneous QHS   isosorbide mononitrate  60 mg Oral Daily   pneumococcal 23 valent vaccine  0.5 mL Intramuscular Tomorrow-1000   sodium chloride flush  3 mL Intravenous Q12H   Continuous Infusions:  sodium chloride       LOS: 2 days    Time spent: 40 minutes    Ramiro Harvest, MD [facility]   To contact the attending provider between 7A-7P or the covering provider during after hours 7P-7A, please log into the web site [email or web address] and access using universal [place] password for that web site. If you do not have the password, please call the hospital operator.  05/27/2020, 12:01 PM

## 2020-05-27 NOTE — Progress Notes (Signed)
Physical Therapy Treatment Patient Details Name: Meghan Ford MRN: [id number] DOB: 12/15/1940 Today's Date: 05/27/2020    History of Present Illness Pt is a 80 y.o. female admitted 05/24/20 with dyspnea, BLE edema. Workup for acute on chronic diastolic HF, hypertensive urgency, AKI on CKD III. PMH includes CKD, HTN, DM2, OA, CVA, PVD, obesity; of note, recent admission 02/2020 with acute metabolic encephalopathy.   PT Comments    Pt seen for additional session, now motivated to walk secondary to needing to have a bowel movement. Pt's ability to mobilize improved since initial evaluation; able to stand and ambulate short distance in room with RW and minA (+2 safety due to continued c/o significant BLE pain). Pt motivated for d/c home with assist from family. Would benefit from w/c for household and community distances. Will continue to follow acutely.    Follow Up Recommendations  Home health PT;Supervision for mobility/OOB     Equipment Recommendations  Wheelchair (measurements PT);Wheelchair cushion (measurements PT)    Recommendations for Other Services       Precautions / Restrictions Precautions Precautions: Fall;Other (comment) Precaution Comments: Urine incontinence Restrictions Weight Bearing Restrictions: No    Mobility  Bed Mobility Overal bed mobility: Needs Assistance Bed Mobility: Supine to Sit     Supine to sit: Mod assist;HOB elevated     General bed mobility comments: Received in recliner    Transfers Overall transfer level: Needs assistance Equipment used: Rolling walker (2 wheeled) Transfers: Sit to/from Stand Sit to Stand: Min assist;+2 safety/equipment Stand pivot transfers: Mod assist;+2 physical assistance;+2 safety/equipment       General transfer comment: Repeated cues for correct hand placement as pt attempting to pull on RW, minA for trunk elevation; good eccentric control sitting on low toilet height with use of grab bar; reliant  on minA and grab bar standing from toilet  Ambulation/Gait Ambulation/Gait assistance: Min assist;+2 safety/equipment Gait Distance (Feet): 28 Feet Assistive device: Rolling walker (2 wheeled) Gait Pattern/deviations: Step-through pattern;Decreased stride length;Trunk flexed Gait velocity: Decreased   General Gait Details: Improved gait mechanics with RW and minA +2 safety/balance; pt c/o 10/10 BLE pain although no grimacing/guarding noted with ambulation; frequent cues for RW management and to maintain closer proximity   Stairs             Wheelchair Mobility    Modified Rankin (Stroke Patients Only)       Balance Overall balance assessment: Needs assistance Sitting-balance support: No upper extremity supported;Feet supported Sitting balance-Leahy Scale: Good     Standing balance support: No upper extremity supported;During functional activity;Bilateral upper extremity supported Standing balance-Leahy Scale: Fair Standing balance comment: Reliant on UE support for dynamic movement but able to stand at sink without UE support for washing hands                            Cognition Arousal/Alertness: Awake/alert Behavior During Therapy: WFL for tasks assessed/performed Overall Cognitive Status: Within Functional Limits for tasks assessed                                 General Comments: WFL for simple tasks; frequent cues for safety/technique with RW (pt does not use at baseline)      Exercises      General Comments General comments (skin integrity, edema, etc.): Session performed with Spanish interpreter, [name]      Pertinent Vitals/Pain Pain Assessment: Faces  Faces Pain Scale: Hurts a little bit Pain Location: BLEs Pain Descriptors / Indicators: Discomfort;Guarding Pain Intervention(s): Monitored during session    Home Living Family/patient expects to be discharged to:: Private residence Living Arrangements:  Children;Non-relatives/Friends Available Help at Discharge: Family;Friend(s);Available PRN/intermittently Type of Home: House Home Access: Level entry   Home Layout: One level Home Equipment: Cane - single point;Grab bars - tub/shower;Walker - 2 wheels;Bedside commode Additional Comments: Lives with son (35 y.o.); also rents a room to a woman -- all persons work during the day. Multiple family members visit often. Reports she can potentially stay with daughter at d/c    Prior Function Level of Independence: Independent with assistive device(s)  Gait / Transfers Assistance Needed: Mod indep with SPC. Later reports son helps pt get in/out of home and into car. 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, radio) ADL's / Homemaking Assistance Needed: Pt reports indep with ADL tasks Comments: mod I for ADL but family assists with IADL   PT Goals (current goals can now be found in the care plan section) Acute Rehab PT Goals Patient Stated Goal: Return home with family assist; wants to wait and see what doctor says about need for rehab Progress towards PT goals: Progressing toward goals    Frequency    Min 3X/week      PT Plan Current plan remains appropriate    Co-evaluation PT/OT/SLP Co-Evaluation/Treatment: Yes Reason for Co-Treatment: To address functional/ADL transfers;For patient/therapist safety;Other (comment) (use of spanish interpreter) PT goals addressed during session: Mobility/safety with mobility;Balance;Proper use of DME;Strengthening/ROM OT goals addressed during session: ADL's and self-care;Proper use of Adaptive equipment and DME;Strengthening/ROM      AM-PAC PT "6 Clicks" Mobility   Outcome Measure  Help needed turning from your back to your side while in a flat bed without using bedrails?: A Lot Help needed moving from lying on your back to sitting on the side of a flat bed without using bedrails?: A Lot Help needed moving to and from a bed to a chair (including a  wheelchair)?: A Little Help needed standing up from a chair using your arms (e.g., wheelchair or bedside chair)?: A Little Help needed to walk in hospital room?: A Little Help needed climbing 3-5 steps with a railing? : A Lot 6 Click Score: 15    End of Session   Activity Tolerance: Patient tolerated treatment well;Patient limited by pain Patient left: in chair;with call bell/phone within reach;with chair alarm set Nurse Communication: Mobility status PT Visit Diagnosis: Other abnormalities of gait and mobility (R26.89);Pain Pain - part of body: Leg;Ankle and joints of foot     Time: 7829-5621 PT Time Calculation (min) (ACUTE ONLY): 23 min  Charges:  $Therapeutic Activity: 8-22 mins                    Ina Homes, PT, DPT Acute Rehabilitation Services  Pager [phone] Office [phone]  Malachy Chamber 05/27/2020, 2:11 PM

## 2020-05-27 NOTE — Evaluation (Signed)
Physical Therapy Evaluation Patient Details Name: Meghan Ford MRN: [id number] DOB: 08/07/1940 Today's Date: 05/27/2020   History of Present Illness  Pt is a 80 y.o. female admitted 05/24/20 with dyspnea, BLE edema. Workup for acute on chronic diastolic HF, hypertensive urgency, AKI on CKD III. PMH includes CKD, HTN, DM2, OA, CVA, PVD, obesity; of note, recent admission 02/2020 with acute metabolic encephalopathy.    Clinical Impression  Pt presents with an overall decrease in functional mobility secondary to above. PTA, pt reports mod indep ambulating with SPC, lives with son; family assists with household tasks as needed. Today, pt required up to modA+2 for mobility, only able to stand and take a few steps with RW. Pt limited by c/o nausea and significant BLE pain. Increased time discussing d/c recommendations and current assist needs; pt reports she could d/c to daughter's home for increased assist; pt unsure about recommendation for SNF-level therapies to maximize functional mobility and independence prior to return home. Will follow acutely to address established goals.  HR 76, seated BP 150/68  Session performed with Spanish interpreter, [name]    Follow Up Recommendations SNF;Supervision for mobility/OOB    Equipment Recommendations  Wheelchair (measurements PT);Wheelchair cushion (measurements PT)    Recommendations for Other Services       Precautions / Restrictions Precautions Precautions: Fall;Other (comment) Precaution Comments: Urine incontinence Restrictions Weight Bearing Restrictions: No      Mobility  Bed Mobility Overal bed mobility: Needs Assistance Bed Mobility: Supine to Sit     Supine to sit: Mod assist;HOB elevated     General bed mobility comments: Heavy modA to elevate trunk and scoot hips to EOB, cues to use bed rail; increased time and effort, pt reports limited by pain    Transfers Overall transfer level: Needs  assistance Equipment used: Rolling walker (2 wheeled) Transfers: Sit to/from Stand Sit to Stand: Mod assist;+2 physical assistance         General transfer comment: ModA+2 to assist trunk elevation, stabilize RW and maintain balance; pt with increased time and effort, c/o BLE pain  Ambulation/Gait Ambulation/Gait assistance: +2 safety/equipment;Min assist Gait Distance (Feet): 4 Feet Assistive device: Rolling walker (2 wheeled) Gait Pattern/deviations: Step-to pattern;Trunk flexed;Antalgic Gait velocity: Decreased   General Gait Details: Side steps towards HOB then pivotal steps to recliner with RW and minA+2 for RW management and stability; cues for sequencing and safety  Stairs            Wheelchair Mobility    Modified Rankin (Stroke Patients Only)       Balance Overall balance assessment: Needs assistance   Sitting balance-Leahy Scale: Fair       Standing balance-Leahy Scale: Poor Standing balance comment: Reliant on UE support                             Pertinent Vitals/Pain Pain Assessment: Faces Faces Pain Scale: Hurts little more Pain Location: Bilateral feet ("because my sugar is low") Pain Descriptors / Indicators: Burning Pain Intervention(s): Monitored during session    Home Living Family/patient expects to be discharged to:: Private residence Living Arrangements: Children;Non-relatives/Friends Available Help at Discharge: Family;Friend(s);Available PRN/intermittently Type of Home: House Home Access: Level entry     Home Layout: One level Home Equipment: Cane - single point;Grab bars - tub/shower;Walker - 2 wheels;Bedside commode Additional Comments: Lives with son (57 y.o.); also rents a room to a woman -- all persons work during the day. Multiple family members visit  often. Reports she can potentially stay with daughter at d/c    Prior Function Level of Independence: Independent with assistive device(s)   Gait / Transfers  Assistance Needed: Mod indep with SPC. Later reports son helps pt get in/out of home and into car. 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, radio)  ADL's / Homemaking Assistance Needed: Pt reports indep with ADL tasks        Hand Dominance        Extremity/Trunk Assessment   Upper Extremity Assessment Upper Extremity Assessment: Generalized weakness    Lower Extremity Assessment Lower Extremity Assessment: Generalized weakness       Communication   Communication: Prefers language other than English;Interpreter utilized (in-person Administrator, sports)  Cognition Arousal/Alertness: Awake/alert Behavior During Therapy: WFL for tasks assessed/performed Overall Cognitive Status: Within Functional Limits for tasks assessed                                 General Comments: WFL for majority of simple tasks via Spanish interpreter; although providing inconsistent info regarding who she lives with, able to correct when asked clarifying question      General Comments General comments (skin integrity, edema, etc.): C/o nausea upon sitting EOB; BP 150/68, HR 76, SpO2 93% on RA    Exercises     Assessment/Plan    PT Assessment Patient needs continued PT services  PT Problem List Decreased strength;Decreased activity tolerance;Decreased balance;Decreased mobility;Pain;Cardiopulmonary status limiting activity       PT Treatment Interventions DME instruction;Gait training;Stair training;Functional mobility training;Therapeutic activities;Therapeutic exercise;Balance training;Patient/family education;Wheelchair mobility training    PT Goals (Current goals can be found in the Care Plan section)  Acute Rehab PT Goals Patient Stated Goal: Return home with family assist; wants to wait and see what doctor says about need for rehab PT Goal Formulation: With patient Time For Goal Achievement: 06/10/20 Potential to Achieve Goals: Fair    Frequency Min 3X/week   Barriers to  discharge Decreased caregiver support      Co-evaluation PT/OT/SLP Co-Evaluation/Treatment: Yes Reason for Co-Treatment: For patient/therapist safety;To address functional/ADL transfers PT goals addressed during session: Mobility/safety with mobility;Balance;Proper use of DME         AM-PAC PT "6 Clicks" Mobility  Outcome Measure Help needed turning from your back to your side while in a flat bed without using bedrails?: A Lot Help needed moving from lying on your back to sitting on the side of a flat bed without using bedrails?: A Lot Help needed moving to and from a bed to a chair (including a wheelchair)?: A Lot Help needed standing up from a chair using your arms (e.g., wheelchair or bedside chair)?: A Lot Help needed to walk in hospital room?: A Little Help needed climbing 3-5 steps with a railing? : A Lot 6 Click Score: 13    End of Session   Activity Tolerance: Patient limited by pain;Patient limited by fatigue Patient left: in chair;with call bell/phone within reach Nurse Communication: Mobility status PT Visit Diagnosis: Other abnormalities of gait and mobility (R26.89);Pain Pain - part of body: Leg;Ankle and joints of foot    Time: 2671-2458 PT Time Calculation (min) (ACUTE ONLY): 25 min   Charges:   PT Evaluation $PT Eval Moderate Complexity: 1 Mod     Ina Homes, PT, DPT Acute Rehabilitation Services  Pager [phone] Office [phone]  Malachy Chamber 05/27/2020, 11:20 AM

## 2020-05-27 NOTE — Progress Notes (Signed)
Occupational Therapy Treatment Patient Details Name: Meghan Ford MRN: [id number] DOB: 05-30-1940 Today's Date: 05/27/2020    History of present illness Pt is a 80 y.o. female admitted 05/24/20 with dyspnea, BLE edema. Workup for acute on chronic diastolic HF, hypertensive urgency, AKI on CKD III. PMH includes CKD, HTN, DM2, OA, CVA, PVD, obesity; of note, recent admission 02/2020 with acute metabolic encephalopathy.   OT comments  Pt with dramatic improvement since last session. Shortly after last session, Pt requested assist to walk to bathroom for toilet needs. She was able to perform transfers at min A +2 this session, peri care at min A, and standing balance for standing grooming tasks at sink without UE support, no SOB and SpO2 remained at or above 90% throughout ambulation (in room) and activity. DC recommendations updated to [facility] with initial 24 hour assist. WIll focus on access to LB for ADL next session.   Follow Up Recommendations  Supervision/Assistance - 24 hour;Home health OT (initially)    Equipment Recommendations  Tub/shower seat    Recommendations for Other Services      Precautions / Restrictions Precautions Precautions: Fall;Other (comment) Precaution Comments: Urine incontinence Restrictions Weight Bearing Restrictions: No       Mobility Bed Mobility Overal bed mobility: Needs Assistance Bed Mobility: Supine to Sit     Supine to sit: Mod assist;HOB elevated     General bed mobility comments: Pt OOB in recliner at beginning and end of session    Transfers Overall transfer level: Needs assistance Equipment used: Rolling walker (2 wheeled) Transfers: Sit to/from Stand Sit to Stand: Min assist;+2 safety/equipment Stand pivot transfers: Mod assist;+2 physical assistance;+2 safety/equipment       General transfer comment: with VC and education for safe hand placement, Pt with improved ability to transfer into standing, increased time and  assist to boost/steady but dramatic improvement from previous session    Balance Overall balance assessment: Needs assistance Sitting-balance support: No upper extremity supported;Feet supported Sitting balance-Leahy Scale: Good     Standing balance support: No upper extremity supported;During functional activity;Bilateral upper extremity supported Standing balance-Leahy Scale: Fair Standing balance comment: Reliant on UE support for dynamic movement but able to stand at sink without UE support for washing hands                           ADL either performed or assessed with clinical judgement   ADL Overall ADL's : Needs assistance/impaired Eating/Feeding: Set up;Sitting   Grooming: Wash/dry hands;Min guard;Standing Grooming Details (indicate cue type and reason): sink level, able to perform without leaning against sink Upper Body Bathing: Moderate assistance;Sitting Upper Body Bathing Details (indicate cue type and reason): for back Lower Body Bathing: Moderate assistance Lower Body Bathing Details (indicate cue type and reason): assist for knees down Upper Body Dressing : Min guard;Sitting   Lower Body Dressing: Maximal assistance;Sitting/lateral leans Lower Body Dressing Details (indicate cue type and reason): to don socks EOB Toilet Transfer: +2 for safety/equipment;BSC;RW;Minimal assistance;Ambulation Toilet Transfer Details (indicate cue type and reason): with cues for safe hand placement, overall ability to push up from chair improved dramatically Toileting- Clothing Manipulation and Hygiene: Sit to/from stand;Minimal assistance Toileting - Clothing Manipulation Details (indicate cue type and reason): use of grab bar by toilet to assist with sit<>stand and Pt proivided with warm wash cloth, but able to perform in standing. Therapist performed rear peri care to ensure thoroughness.     Functional mobility during ADLs: +2  for safety/equipment;Cueing for  sequencing;Cueing for safety;Rolling walker;Minimal assistance General ADL Comments: VERY helpful to have Graciella (spanish interpreter) there to assist     Vision Patient Visual Report: No change from baseline     Perception     Praxis      Cognition Arousal/Alertness: Awake/alert Behavior During Therapy: WFL for tasks assessed/performed Overall Cognitive Status: Within Functional Limits for tasks assessed                                 General Comments: WFL for majority of simple tasks via Spanish interpreter; although providing inconsistent info regarding who she lives with, able to correct when asked clarifying question        Exercises     Shoulder Instructions       General Comments C/o nausea upon sitting EOB; BP 150/68, HR 76, SpO2 93% on RA    Pertinent Vitals/ Pain       Pain Assessment: Faces Faces Pain Scale: Hurts a little bit Pain Location: Bilateral feet ("because my sugar is low") Pain Descriptors / Indicators: Burning Pain Intervention(s): Monitored during session;Repositioned  Home Living Family/patient expects to be discharged to:: Private residence Living Arrangements: Children;Non-relatives/Friends Available Help at Discharge: Family;Friend(s);Available PRN/intermittently Type of Home: House Home Access: Level entry     Home Layout: One level     Bathroom Shower/Tub: Producer, television/film/video: Standard     Home Equipment: Cane - single point;Grab bars - tub/shower;Walker - 2 wheels;Bedside commode   Additional Comments: Lives with son (36 y.o.); also rents a room to a woman -- all persons work during the day. Multiple family members visit often. Reports she can potentially stay with daughter at d/c      Prior Functioning/Environment Level of Independence: Independent with assistive device(s)  Gait / Transfers Assistance Needed: Mod indep with SPC. Later reports son helps pt get in/out of home and into car. Enjoys  Armed forces operational officer Production assistant, radio) ADL's / Homemaking Assistance Needed: Pt reports indep with ADL tasks   Comments: mod I for ADL but family assists with IADL   Frequency  Min 2X/week        Progress Toward Goals  OT Goals(current goals can now be found in the care plan section)  Progress towards OT goals: Progressing toward goals  Acute Rehab OT Goals Patient Stated Goal: Return home with family assist; wants to wait and see what doctor says about need for rehab OT Goal Formulation: With patient Time For Goal Achievement: 06/10/20 Potential to Achieve Goals: Good  Plan Discharge plan needs to be updated;Frequency remains appropriate    Co-evaluation    PT/OT/SLP Co-Evaluation/Treatment: Yes Reason for Co-Treatment: To address functional/ADL transfers;For patient/therapist safety;Other (comment) (use of spanish interpreter) PT goals addressed during session: Mobility/safety with mobility;Balance;Proper use of DME;Strengthening/ROM OT goals addressed during session: ADL's and self-care;Proper use of Adaptive equipment and DME;Strengthening/ROM      AM-PAC OT "6 Clicks" Daily Activity     Outcome Measure   Help from another person eating meals?: None Help from another person taking care of personal grooming?: A Little Help from another person toileting, which includes using toliet, bedpan, or urinal?: A Little Help from another person bathing (including washing, rinsing, drying)?: A Lot Help from another person to put on and taking off regular upper body clothing?: A Little Help from another person to put on and taking off regular lower body clothing?: A  Lot 6 Click Score: 17    End of Session Equipment Utilized During Treatment: Gait belt;Rolling walker  OT Visit Diagnosis: Unsteadiness on feet (R26.81);Other abnormalities of gait and mobility (R26.89);Muscle weakness (generalized) (M62.81)   Activity Tolerance Patient tolerated treatment well   Patient Left in  chair;with call bell/phone within reach;with chair alarm set   Nurse Communication Mobility status;Precautions        Time: 3403-5248 OT Time Calculation (min): 23 min  Charges: OT General Charges $OT Visit: 1 Visit OT Evaluation $OT Eval Moderate Complexity: 1 Mod OT Treatments $Self Care/Home Management : 8-22 mins  Nyoka Cowden OTR/L Acute Rehabilitation Services Pager: [phone] Office: [phone]   Evern Bio Hilliard 05/27/2020, 12:54 PM

## 2020-05-27 NOTE — Progress Notes (Signed)
SATURATION QUALIFICATIONS: (This note is used to comply with regulatory documentation for home oxygen)  Patient Saturations on Room Air at Rest = 93%  Patient Saturations on Room Air while Ambulating = 93%  Patient Saturations on N/A Liters of oxygen while Ambulating = N/A  Please briefly explain why patient needs home oxygen: Patient does not currently require supplemental oxygen  Ina Homes, PT, DPT Acute Rehabilitation Services  Pager [phone] Office [phone]

## 2020-05-28 ENCOUNTER — Inpatient Hospital Stay (HOSPITAL_COMMUNITY): Payer: Self-pay

## 2020-05-28 ENCOUNTER — Other Ambulatory Visit: Payer: Self-pay | Admitting: Internal Medicine

## 2020-05-28 DIAGNOSIS — E1165 Type 2 diabetes mellitus with hyperglycemia: Secondary | ICD-10-CM

## 2020-05-28 DIAGNOSIS — Z794 Long term (current) use of insulin: Secondary | ICD-10-CM

## 2020-05-28 DIAGNOSIS — R55 Syncope and collapse: Secondary | ICD-10-CM | POA: Diagnosis not present

## 2020-05-28 DIAGNOSIS — E1142 Type 2 diabetes mellitus with diabetic polyneuropathy: Secondary | ICD-10-CM

## 2020-05-28 LAB — BASIC METABOLIC PANEL
Anion gap: 9 (ref 5–15)
BUN: 22 mg/dL (ref 8–23)
CO2: 30 mmol/L (ref 22–32)
Calcium: 8.1 mg/dL — ABNORMAL LOW (ref 8.9–10.3)
Chloride: 100 mmol/L (ref 98–111)
Creatinine, Ser: 1.93 mg/dL — ABNORMAL HIGH (ref 0.44–1.00)
GFR, Estimated: 26 mL/min — ABNORMAL LOW (ref >60)
Glucose, Bld: 99 mg/dL (ref 70–99)
Potassium: 4 mmol/L (ref 3.5–5.1)
Sodium: 139 mmol/L (ref 135–145)

## 2020-05-28 LAB — CBC WITH DIFFERENTIAL/PLATELET
Abs Immature Granulocytes: 0.03 10*3/uL (ref 0.00–0.07)
Basophils Absolute: 0 10*3/uL (ref 0.0–0.1)
Basophils Relative: 0 %
Eosinophils Absolute: 0.1 10*3/uL (ref 0.0–0.5)
Eosinophils Relative: 1 %
HCT: 40 % (ref 36.0–46.0)
Hemoglobin: 12.7 g/dL (ref 12.0–15.0)
Immature Granulocytes: 0 %
Lymphocytes Relative: 22 %
Lymphs Abs: 1.7 10*3/uL (ref 0.7–4.0)
MCH: 26.1 pg (ref 26.0–34.0)
MCHC: 31.8 g/dL (ref 30.0–36.0)
MCV: 82.1 fL (ref 80.0–100.0)
Monocytes Absolute: 0.5 10*3/uL (ref 0.1–1.0)
Monocytes Relative: 6 %
Neutro Abs: 5.3 10*3/uL (ref 1.7–7.7)
Neutrophils Relative %: 71 %
Platelets: 255 10*3/uL (ref 150–400)
RBC: 4.87 MIL/uL (ref 3.87–5.11)
RDW: 15.9 % — ABNORMAL HIGH (ref 11.5–15.5)
WBC: 7.7 10*3/uL (ref 4.0–10.5)
nRBC: 0 % (ref 0.0–0.2)

## 2020-05-28 LAB — COMPREHENSIVE METABOLIC PANEL
ALT: 16 U/L (ref 0–44)
AST: 34 U/L (ref 15–41)
Albumin: 2.3 g/dL — ABNORMAL LOW (ref 3.5–5.0)
Alkaline Phosphatase: 79 U/L (ref 38–126)
Anion gap: 12 (ref 5–15)
BUN: 28 mg/dL — ABNORMAL HIGH (ref 8–23)
CO2: 24 mmol/L (ref 22–32)
Calcium: 8.1 mg/dL — ABNORMAL LOW (ref 8.9–10.3)
Chloride: 100 mmol/L (ref 98–111)
Creatinine, Ser: 2.32 mg/dL — ABNORMAL HIGH (ref 0.44–1.00)
GFR, Estimated: 21 mL/min — ABNORMAL LOW (ref >60)
Glucose, Bld: 141 mg/dL — ABNORMAL HIGH (ref 70–99)
Potassium: 4.6 mmol/L (ref 3.5–5.1)
Sodium: 136 mmol/L (ref 135–145)
Total Bilirubin: 1 mg/dL (ref 0.3–1.2)
Total Protein: 5.6 g/dL — ABNORMAL LOW (ref 6.5–8.1)

## 2020-05-28 LAB — GLUCOSE, CAPILLARY
Glucose-Capillary: 99 mg/dL (ref 70–99)
Glucose-Capillary: 113 mg/dL — ABNORMAL HIGH (ref 70–99)
Glucose-Capillary: 157 mg/dL — ABNORMAL HIGH (ref 70–99)
Glucose-Capillary: 120 mg/dL — ABNORMAL HIGH (ref 70–99)

## 2020-05-28 LAB — TROPONIN I (HIGH SENSITIVITY)
Troponin I (High Sensitivity): 59 ng/L — ABNORMAL HIGH (ref <18)
Troponin I (High Sensitivity): 60 ng/L — ABNORMAL HIGH (ref <18)

## 2020-05-28 LAB — MAGNESIUM: Magnesium: 2.1 mg/dL (ref 1.7–2.4)

## 2020-05-28 MED ORDER — ASPIRIN 81 MG PO TBEC: 81.0000 mg | DELAYED_RELEASE_TABLET | Freq: Every day | ORAL | 1 refills | Status: AC

## 2020-05-28 MED ORDER — SODIUM CHLORIDE 0.9 % IV SOLN: INTRAVENOUS | Status: DC

## 2020-05-28 MED ORDER — FUROSEMIDE 40 MG PO TABS: 40.0000 mg | ORAL_TABLET | Freq: Every day | ORAL | Status: DC

## 2020-05-28 MED ORDER — CLOPIDOGREL BISULFATE 75 MG PO TABS: 75.0000 mg | ORAL_TABLET | Freq: Every day | ORAL | 1 refills | Status: AC

## 2020-05-28 MED ORDER — GABAPENTIN 300 MG PO CAPS: 300.0000 mg | ORAL_CAPSULE | Freq: Every day | ORAL | 1 refills | Status: AC

## 2020-05-28 MED ORDER — INSULIN ASPART 100 UNIT/ML ~~LOC~~ SOLN
0.0000 [IU] | SUBCUTANEOUS | Status: DC
  Administered 2020-05-29: 2 [IU] via SUBCUTANEOUS

## 2020-05-28 MED ORDER — ATORVASTATIN CALCIUM 80 MG PO TABS
80.0000 mg | ORAL_TABLET | Freq: Every day | ORAL | 1 refills | Status: AC
Start: 2020-05-28 — End: ?

## 2020-05-28 MED ORDER — HYDRALAZINE HCL 100 MG PO TABS: 100.0000 mg | ORAL_TABLET | Freq: Three times a day (TID) | ORAL | 1 refills | Status: AC

## 2020-05-28 MED ORDER — FUROSEMIDE 40 MG PO TABS: 40.0000 mg | ORAL_TABLET | Freq: Every day | ORAL | 1 refills | Status: DC

## 2020-05-28 MED ORDER — AMLODIPINE BESYLATE 10 MG PO TABS: 10.0000 mg | ORAL_TABLET | Freq: Every day | ORAL | 1 refills | Status: AC

## 2020-05-28 MED ORDER — DICLOFENAC SODIUM 1 % EX GEL: 2.0000 g | Freq: Three times a day (TID) | CUTANEOUS | 0 refills | Status: AC

## 2020-05-28 MED ORDER — ISOSORBIDE MONONITRATE ER 60 MG PO TB24: 60.0000 mg | ORAL_TABLET | Freq: Every day | ORAL | 1 refills | Status: AC

## 2020-05-28 NOTE — Progress Notes (Signed)
Discharge information was discussed with pt. Pt denies questions. Pt was fully alert and oriented times four, did not showed any signs of distress. Pt stated she was happy of going home.  Nurse received call from grandson that he was at the main entrance.  Nurse techs transported pt to main entrance for discharge.   This nurse received a phone call from nurse tech and RR  RN, stating that pt went unresponsive while in the wheelchair at main entrance.  Per RR RN and NT, pt had slumped forward and had an episode of emesis.  Pt was transported back to room.  Attending physician notified of event.  Vitals, blood sugar, EKG, Labs completed.   Currently pt is alert and oriented to person and place. Pt's grandson at bedside. Call bell within reach.

## 2020-05-28 NOTE — Discharge Summary (Signed)
Physician Discharge Summary  Meghan Ford VWU:[id number] DOB: November 01, 1940 DOA: 05/24/2020  PCP: Charlott Rakes, MD  Admit date: 05/24/2020 Discharge date: 05/28/2020  Time spent: 55 minutes  Recommendations for Outpatient Follow-up:  1. Follow-up with Charlott Rakes, MD on 06/15/2020 at 3:30 PM.  On follow-up patient will need a basic metabolic profile done to follow-up on electrolytes and renal function.  Patient's diabetes will need to be reassessed as patient's blood glucose levels were well controlled on sliding scale insulin only during the hospitalization and patient's long-acting insulin discontinued on discharge.  Patient's blood pressure also needs to be followed up upon.  Patient may benefit from outpatient referral to cardiology.  Patient will need refills on her medications on follow-up.   Discharge Diagnoses:  Principal Problem:   Acute on chronic heart failure with preserved ejection fraction (HFpEF) (HCC) Active Problems:   Diabetes mellitus type II, uncontrolled ([place])   Essential hypertension   Cerebrovascular accident (CVA) due to stenosis of right middle cerebral artery ([place])   DM type 2 causing CKD stage 3 ([place])   Class 3 obesity   Hypertensive emergency   Discharge Condition: Stable and improved  Diet recommendation: Heart healthy  Filed Weights   05/26/20 0333 05/27/20 0349 05/28/20 0134  Weight: 101.5 kg 99.9 kg 96.5 kg    History of present illness:  HPI per Dr.Chotiner Meghan Ford is a 80 y.o. female with medical history significant for HTN, DMT2, HFpEF, HLD, PVD, hx of CVA who presented with complaint of increasing SOB and edema of legs. Pt speaks limited english. Seen with aide of interpretor. She reported increased edema of her legs over the last week with increasing shortness of breath.  She also reported having increased feeling of smothering when she lays flat which is not normal for her.  Denied any chest pain or palpitations.   Has not had any recent fever, chills, abdominal pain, nausea, vomiting, diarrhea.  She denied any dysuria or flank pain.  She reported has been taking her medications as prescribed. Her last echocardiogram was in 2019 and at that time she had a normal EF. Denies tobacco, ETOH use. Lives with family.   ED Course: In the emergency room she was found to have a significant elevated blood pressure over 200/100.  She was given labetalol IV in the emergency room.  She was also placed on nitroglycerin paste.  Hospital Course:  1 acute on chronic diastolic heart failure Patient presented worsening shortness of breath, worsening lower extremity edema, elevated blood pressure with systolics on presentation in the 200s.  EKG done with normal axis, normal sinus rhythm, no ischemic changes noted. -Patient noted to have run out of her medications prior to admission and was awaiting outpatient follow-up with PCP to get those refilled  could not get an appointment till May. -2D echo with EF 60 to 65%,NWMA, grade 1 DD, elevated left atrial pressure, normal pulmonary artery systolic pressure. -Patient placed on IV Lasix with good diuresis and clinical improvement during the hospitalization.   -Patient noted to be -4.6 L during this hospitalization.   -Patient improved clinically and was satting 95% on room air by day of discharge.   -Patient's weight on day of discharge was 96.5 kg from 106.7 kg on admission  -ACE inhibitor was discontinued due to acute kidney injury.   -Patient maintained on Norvasc, hydralazine, Imdur, statin, aspirin during the hospitalization.   -Beta-blocker not started due to concerns for bradycardia.   -Outpatient follow-up with PCP.   -On  follow-up may benefit from outpatient referral to cardiology.  2.  Hypertensive emergency Patient presented with systolic blood pressures in the 200s, and acute on chronic heart failure. -On day of discharge patient did state had run out of her  medications and was awaiting follow-up with PCP in order to have them refilled. -Patient initially placed on a nitro patch.   -Patient was placed on increased dose of Norvasc at 10 mg daily.  Home regimen of Imdur 60 mg daily resumed.  Patient was also on IV Lasix for diuresis.   -For better blood pressure control patient started on hydralazine which was uptitrated to 100 mg 3 times a day with better blood pressure control.   -Patient's ACE inhibitor discontinued during the hospitalization due to acute kidney injury.   -Outpatient follow-up with PCP for blood pressure control.  3.  Acute kidney injury on chronic kidney disease stage IIIb Likely secondary to problem #1.  Patient with good urine output with diuresis.  -Patient's ACE inhibitor was held throughout the hospitalization and will not be resumed on discharge.   -Patient was diuresed with IV Lasix and subsequently transition to oral Lasix on discharge.   -Outpatient follow-up with PCP.   4.  Diabetes mellitus type 2 with neuropathy/hyperlipidemia Hemoglobin A1c 6.8. -Patient blood glucose levels were well controlled during the hospitalization on sliding scale insulin.  Patient also placed back on home regimen Neurontin and maintained on statin.   -Patient CBGs throughout the hospitalization ranged from 99 -160s on sliding scale insulin.   -Patient's long-acting insulin will be discontinued on discharge.   -Patient is to check blood glucose levels 4 times daily and make a log and showed a log to PCP on follow-up.   -On follow-up PCP will determine whether patient needs to go back on the insulin versus oral hypoglycemic agents.   -Patient was discharged in stable and improved condition.  Outpatient follow-up with PCP.  5.  Osteoarthritis Patient placed on topical diclofenac to right knee.  PT/OT assess patient and recommended home health therapies..  6.  History of CVA Remained stable.  Patient maintained on home regimen of  aspirin, Plavix, statin.  Risk factor modification.  Outpatient follow-up with PCP.    7.  Obesity class II-III Lifestyle modification.  Outpatient follow-up.   Procedures:  Chest x-ray 05/24/2020  2D echo 05/25/2020    Consultations:  None  Discharge Exam: Vitals:   05/28/20 0828 05/28/20 1216  BP: (!) 130/95 (!) 134/56  Pulse: 81 76  Resp: 18 18  Temp: 98.7 F (37.1 C) 98.7 F (37.1 C)  SpO2: 91% 95%    General: NAD Cardiovascular: RRR Respiratory: CTAB  Discharge Instructions   Discharge Instructions    Diet - low sodium heart healthy   Complete by: As directed    Increase activity slowly   Complete by: As directed      Allergies as of 05/28/2020   No Known Allergies     Medication List    STOP taking these medications   aspirin 81 MG tablet Replaced by: aspirin 81 MG EC tablet   insulin glargine 100 UNIT/ML injection Commonly known as: LANTUS   lisinopril 20 MG tablet Commonly known as: ZESTRIL   naproxen 500 MG tablet Commonly known as: Naprosyn     TAKE these medications   amLODipine 10 MG tablet Commonly known as: NORVASC Take 1 tablet (10 mg total) by mouth daily. What changed: how much to take   aspirin 81 MG EC tablet  Take 1 tablet (81 mg total) by mouth daily. Swallow whole. Start taking on: May 29, 2020 Replaces: aspirin 81 MG tablet   atorvastatin 80 MG tablet Commonly known as: LIPITOR Take 1 tablet (80 mg total) by mouth daily.   blood glucose meter kit and supplies Kit Dispense based on patient and insurance preference. Use up to four times daily as directed. (FOR ICD-9 250.00, 250.01).   clopidogrel 75 MG tablet Commonly known as: PLAVIX Take 1 tablet (75 mg total) by mouth daily. Start taking Plavix on 03/31/2019.   diclofenac Sodium 1 % Gel Commonly known as: VOLTAREN Apply 2 g topically 3 (three) times daily.   furosemide 40 MG tablet Commonly known as: LASIX Take 1 tablet (40 mg total) by mouth  daily. Start taking on: May 29, 2020   gabapentin 300 MG capsule Commonly known as: NEURONTIN Take 1 capsule (300 mg total) by mouth at bedtime.   glucose blood test strip Use as instructed   True Metrix Blood Glucose Test test strip Generic drug: glucose blood Use 3 times daily before meals   hydrALAZINE 100 MG tablet Commonly known as: APRESOLINE Take 1 tablet (100 mg total) by mouth every 8 (eight) hours.   Insulin Syringes (Disposable) U-100 0.3 ML Misc USE AS DIRECTED   isosorbide mononitrate 60 MG 24 hr tablet Commonly known as: IMDUR Take 1 tablet (60 mg total) by mouth daily.   senna-docusate 8.6-50 MG tablet Commonly known as: Senokot-S Take 1 tablet by mouth at bedtime as needed for mild constipation.   True Metrix Meter Devi 1 each by Does not apply route 3 (three) times daily before meals.   TRUEplus Insulin Syringe 31G X 5/16" 0.5 ML Misc Generic drug: Insulin Syringe-Needle U-100 USE AS DIRECTED.   TRUEplus Lancets 28G Misc 1 each by Does not apply route 3 (three) times daily before meals.            Durable Medical Equipment  (From admission, onward)         Start     Ordered   05/27/20 1713  For home use only DME lightweight manual wheelchair with seat cushion  Once       Comments: Patient suffers from debility/diabetic neuropathy which impairs their ability to perform daily activities in the home.  A walking aid will not resolve  issue with performing activities of daily living. A wheelchair will allow patient to safely perform daily activities. Patient is not able to propel themselves in the home using a standard weight wheelchair due to weakness. Patient can self propel in the lightweight wheelchair. Length of need 6 months. Accessories: elevating leg rests (ELRs), wheel locks, extensions and anti-tippers.   05/27/20 1713   05/27/20 1711  For home use only DME Shower stool  Once        05/27/20 1710   05/27/20 1711  For home use only DME 3  n 1  Once        05/27/20 1710         No Known Allergies  Follow-up Information    Charlott Rakes, MD. Schedule an appointment as soon as possible for a visit on 06/15/2020.   Specialty: Family Medicine Why: Follow-up in 1 to 2 weeks._0 :30pm Contact information: [place]  [id number] [phone]                The results of significant diagnostics from this hospitalization (including imaging, microbiology, ancillary and laboratory) are listed below for reference.  Significant Diagnostic Studies: DG Chest 2 View  Result Date: 05/24/2020 CLINICAL DATA:  Shortness of breath and lower extremity swelling x3 days EXAM: CHEST - 2 VIEW COMPARISON:  Chest radiograph February 16, 2020. FINDINGS: Cardiac monitoring device overlies the left chest. Enlarged cardiac silhouette. Left basilar opacity with obscuration left hemidiaphragm. Small left pleural effusion. Left reverse shoulder arthroplasty. IMPRESSION: Left basilar opacity and small left pleural effusion. Electronically Signed   By: Dahlia Bailiff MD   On: 05/24/2020 18:03   ECHOCARDIOGRAM COMPLETE  Result Date: 05/25/2020    ECHOCARDIOGRAM REPORT   Patient Name:   [facility] Date of Exam: 05/25/2020 Medical Rec #:  [id number]                 Height:       65.0 in Accession #:    [id number]                Weight:       227.4 lb Date of Birth:  20-Apr-1940                 BSA:          2.089 m Patient Age:    32 years                  BP:           163/92 mmHg Patient Gender: F                         HR:           58 bpm. Exam Location:  Inpatient Procedure: 2D Echo, Cardiac Doppler and Color Doppler Indications:    M42.68 Chronic systolic (congestive) heart failure  History:        Patient has prior history of Echocardiogram examinations, most                 recent 05/09/2017. Stroke; Risk Factors:Hypertension, Diabetes                 and Dyslipidemia. Sepsis.  Sonographer:    Jonelle Sidle Dance Referring  Phys: [id number]  1. Left ventricular ejection fraction, by estimation, is 60 to 65%. The left ventricle has normal function. The left ventricle has no regional wall motion abnormalities. Left ventricular diastolic parameters are consistent with Grade I diastolic dysfunction (impaired relaxation). Elevated left atrial pressure.  2. Right ventricular systolic function is normal. The right ventricular size is normal. There is normal pulmonary artery systolic pressure. The estimated right ventricular systolic pressure is 29.7 mmHg.  3. Left atrial size was moderately dilated.  4. The mitral valve is normal in structure. Trivial mitral valve regurgitation. No evidence of mitral stenosis.  5. The aortic valve is normal in structure. Aortic valve regurgitation is not visualized. Mild aortic valve sclerosis is present, with no evidence of aortic valve stenosis.  6. The inferior vena cava is dilated in size with >50% respiratory variability, suggesting right atrial pressure of 8 mmHg. Comparison(s): No significant change from prior study. Prior images reviewed side by side. FINDINGS  Left Ventricle: Left ventricular ejection fraction, by estimation, is 60 to 65%. The left ventricle has normal function. The left ventricle has no regional wall motion abnormalities. The left ventricular internal cavity size was normal in size. There is  no left ventricular hypertrophy. Left ventricular diastolic parameters are consistent with Grade I diastolic dysfunction (impaired relaxation). Elevated left atrial pressure. Right  Ventricle: The right ventricular size is normal. No increase in right ventricular wall thickness. Right ventricular systolic function is normal. There is normal pulmonary artery systolic pressure. The tricuspid regurgitant velocity is 2.42 m/s, and  with an assumed right atrial pressure of 8 mmHg, the estimated right ventricular systolic pressure is 83.3 mmHg. Left Atrium: Left atrial size  was moderately dilated. Right Atrium: Right atrial size was normal in size. Pericardium: There is no evidence of pericardial effusion. Mitral Valve: The mitral valve is normal in structure. Mild to moderate mitral annular calcification. Trivial mitral valve regurgitation. No evidence of mitral valve stenosis. Tricuspid Valve: The tricuspid valve is normal in structure. Tricuspid valve regurgitation is not demonstrated. No evidence of tricuspid stenosis. Aortic Valve: The aortic valve is normal in structure. Aortic valve regurgitation is not visualized. Aortic regurgitation PHT measures 710 msec. Mild aortic valve sclerosis is present, with no evidence of aortic valve stenosis. Pulmonic Valve: The pulmonic valve was normal in structure. Pulmonic valve regurgitation is not visualized. No evidence of pulmonic stenosis. Aorta: The aortic root is normal in size and structure. Venous: The inferior vena cava is dilated in size with greater than 50% respiratory variability, suggesting right atrial pressure of 8 mmHg. IAS/Shunts: No atrial level shunt detected by color flow Doppler.  LEFT VENTRICLE PLAX 2D LVIDd:         4.10 cm  Diastology LVIDs:         3.00 cm  LV e' medial:    5.59 cm/s LV PW:         1.50 cm  LV E/e' medial:  15.4 LV IVS:        1.30 cm  LV e' lateral:   5.78 cm/s LVOT diam:     2.30 cm  LV E/e' lateral: 14.9 LV SV:         137 LV SV Index:   65 LVOT Area:     4.15 cm  RIGHT VENTRICLE             IVC RV Basal diam:  2.70 cm     IVC diam: 2.40 cm RV S prime:     13.10 cm/s TAPSE (M-mode): 2.0 cm LEFT ATRIUM             Index       RIGHT ATRIUM           Index LA diam:        4.80 cm 2.30 cm/m  RA Area:     11.30 cm LA Vol (A2C):   87.1 ml 41.69 ml/m RA Volume:   23.40 ml  11.20 ml/m LA Vol (A4C):   56.8 ml 27.19 ml/m LA Biplane Vol: 70.4 ml 33.70 ml/m  AORTIC VALVE LVOT Vmax:   117.00 cm/s LVOT Vmean:  78.100 cm/s LVOT VTI:    0.329 m AI PHT:      710 msec  AORTA Ao Root diam: 3.50 cm Ao Asc diam:   3.60 cm MITRAL VALVE                TRICUSPID VALVE MV Area (PHT): 2.13 cm     TR Peak grad:   23.5 mmHg MV Decel Time: 356 msec     TR Vmax:        242.16 cm/s MV E velocity: 86.20 cm/s MV A velocity: 109.00 cm/s  SHUNTS MV E/A ratio:  0.79         Systemic VTI:  0.33 m  Systemic Diam: 2.30 cm Sanda Klein MD Electronically signed by Sanda Klein MD Signature Date/Time: 05/25/2020/1:02:42 PM    Final     Microbiology: Recent Results (from the past 240 hour(s))  SARS CORONAVIRUS 2 (TAT 6-24 HRS) Nasopharyngeal Nasopharyngeal Swab     Status: None   Collection Time: 05/25/20  2:57 AM   Specimen: Nasopharyngeal Swab  Result Value Ref Range Status   SARS Coronavirus 2 NEGATIVE NEGATIVE Final    Comment: (NOTE) SARS-CoV-2 target nucleic acids are NOT DETECTED.  The SARS-CoV-2 RNA is generally detectable in upper and lower respiratory specimens during the acute phase of infection. Negative results do not preclude SARS-CoV-2 infection, do not rule out co-infections with other pathogens, and should not be used as the sole basis for treatment or other patient management decisions. Negative results must be combined with clinical observations, patient history, and epidemiological information. The expected result is Negative.  Fact Sheet for Patients: SugarRoll.be  Fact Sheet for Healthcare Providers: https://www.woods-mathews.com/  This test is not yet approved or cleared by the [place] FDA and  has been authorized for detection and/or diagnosis of SARS-CoV-2 by FDA under an Emergency Use Authorization (EUA). This EUA will remain  in effect (meaning this test can be used) for the duration of the COVID-19 declaration under Se ction 564(b)(1) of the Act, 21 U.S.C. section 360bbb-3(b)(1), unless the authorization is terminated or revoked sooner.  Performed at [facility], [place] [address], [place],  [place] [address]      Labs: Basic Metabolic Panel: Recent Labs  Lab 05/24/20 1731 05/25/20 0441 05/25/20 1719 05/26/20 0405 05/27/20 0316 05/28/20 0339  NA 141  --  138 140 139 139  K 4.7  --  4.3 4.1 3.7 4.0  CL 108  --  106 105 102 100  CO2 26  --  _0 GLUCOSE 107*  --  131* 103* 131* 99  BUN 19  --  _1 CREATININE 1.75* 1.69* 1.80* 1.71* 1.72* 1.93*  CALCIUM 8.5*  --  7.8* 8.0* 8.0* 8.1*   Liver Function Tests: No results for input(s): AST, ALT, ALKPHOS, BILITOT, PROT, ALBUMIN in the last 168 hours. No results for input(s): LIPASE, AMYLASE in the last 168 hours. No results for input(s): AMMONIA in the last 168 hours. CBC: Recent Labs  Lab 05/24/20 1731 05/25/20 0441 05/26/20 0823 05/27/20 0316  WBC 8.1 7.4 7.9 7.6  NEUTROABS  --   --  4.8  --   HGB 12.1 10.2* 11.9* 11.5*  HCT 39.4 33.8* 38.6 35.6*  MCV 84.4 85.1 82.5 81.5  PLT 258 241 252 255   Cardiac Enzymes: No results for input(s): CKTOTAL, CKMB, CKMBINDEX, TROPONINI in the last 168 hours. BNP: BNP (last 3 results) Recent Labs    02/16/20 2033 05/24/20 2305  BNP 47.9 157.8*    ProBNP (last 3 results) No results for input(s): PROBNP in the last 8760 hours.  CBG: Recent Labs  Lab 05/27/20 1148 05/27/20 1623 05/27/20 2129 05/28/20 0549 05/28/20 1218  GLUCAP 132* 165* 130* 99 113*       Signed:  Irine Seal MD.  [facility] 05/28/2020, 2:57 PM

## 2020-05-28 NOTE — Progress Notes (Cosign Needed Addendum)
    Durable Medical Equipment  (From admission, onward)         Start     Ordered   05/27/20 1713  For home use only DME lightweight manual wheelchair with seat cushion  Once       Comments: Patient suffers from debility/diabetic neuropathy which impairs their ability to perform daily activities in the home.  A walking aid will not resolve  issue with performing activities of daily living. A wheelchair will allow patient to safely perform daily activities. Patient is not able to propel themselves in the home using a standard weight wheelchair due to weakness. Patient can self propel in the lightweight wheelchair. Length of need 6 months. Accessories: elevating leg rests (ELRs), wheel locks, extensions and anti-tippers.   05/27/20 1713   05/27/20 1711  For home use only DME Shower stool  Once        05/27/20 1710   05/27/20 1711  For home use only DME 3 n 1  Once        05/27/20 1710

## 2020-05-28 NOTE — Progress Notes (Signed)
Patient was in the process of being discharged, patient taken downstairs in the wheelchair and was notified per RN that patient had an episode of bilious emesis, slumped forward, with near syncopal event and minimally responsive. Patient subsequently was brought back to the room and discharge canceled. Went to assess patient patient laying in the bed, drowsy, lethargic but arousable to voice.  Patient states not sure what happened however complaining of lightheadedness, generalized weakness.  Patient with some complaints of shortness of breath.  Patient denied any chest pain.  Patient denied any abdominal pain. Physical exam General: Drowsy/lethargic.  Oropharynx: Some whitish exudate noted on tongue. Respiratory: Clear to auscultation bilaterally anterior lung fields.  No wheezes, no crackles, no rhonchi. Cardiovascular: Regular rate rhythm no murmurs rubs or gallops.  No JVD.  No lower extremity edema. GI: Abdomen is soft, nontender, nondistended, positive bowel sounds.  No rebound.  No guarding. Extremities: No clubbing cyanosis trace bilateral lower extremity edema. Neurological: Drowsy but arousable.  Cranial nerves II through XII grossly intact.  5/5 bilateral upper extremity strength.  5/5 bilateral lower extremity strength.  Sensation intact.  2+ brachioradialis reflexes bilaterally.  Unable to elicit patellar reflexes bilaterally.  Unable to elicit Achilles reflexes bilaterally.  Assessment/plan 1.  Near syncope Patient was to be discharged and when taking downstairs noted to have a near syncopal event preceded by emesis with generalized weakness and drowsiness.  Questionable etiology.  CBG checked on arrival back to the floor was 156.  Patient with no focal neurological deficits.  Patient denies any chest pain however does endorse some shortness of breath.  Check a CBC, c-Met, magnesium, acute abdominal series, head CT, orthostatics.  Check CBG every 4 hours.  EKG with no ischemic changes  noted.  Bedside swallow evaluation.  Patient noted to have a 2D echo early on during this hospitalization with normal EF and no significant wall motion abnormalities.  Monitor on telemetry.  If patient orthostatic will place on gentle hydration and hold diuretics.  Follow.  Time spent 50 minutes

## 2020-05-28 NOTE — Progress Notes (Signed)
Physical Therapy Treatment Patient Details Name: Meghan Ford MRN: [id number] DOB: [birth year] Today's Date: 05/28/2020    History of Present Illness Pt is a 80 y.o. female admitted 05/24/20 with dyspnea, BLE edema. Workup for acute on chronic diastolic HF, hypertensive urgency, AKI on CKD III. PMH includes CKD, HTN, DM2, OA, CVA, PVD, obesity; of note, recent admission 02/2020 with acute metabolic encephalopathy.   PT Comments    Pt progressing well with mobility; reports she is to d/c home today. Pt still requires initial modA for trunk elevation to stand, reports limited by BLE pain. Once standing, pt able to mobilize short distances with RW and min guard; assist for standing ADL tasks. Pt reports she will d/c to daughter's home where increased assist available; educ on recommendation to use RW for added stability and fall risk reduction. If to remain admitted, will continue to follow acutely.  Session performed with Spanish interpreter [name]    Follow Up Recommendations  Home health PT;Supervision for mobility/OOB     Equipment Recommendations  Wheelchair (measurements PT);Wheelchair cushion (measurements PT)    Recommendations for Other Services       Precautions / Restrictions Precautions Precautions: Fall;Other (comment) Precaution Comments: Urine incontinence Restrictions Weight Bearing Restrictions: No    Mobility  Bed Mobility Overal bed mobility: Needs Assistance Bed Mobility: Supine to Sit     Supine to sit: Mod assist;HOB elevated     General bed mobility comments: ModA for HHA to elevate trunk; much improved BLE initiation, able to scoot hips to EOB without assist    Transfers Overall transfer level: Needs assistance Equipment used: Rolling walker (2 wheeled) Transfers: Sit to/from Stand Sit to Stand: Mod assist;Min assist         General transfer comment: Initial modA standing from EOB to RW, cues for hand placement; additional stand  from [place] (in front of sink) with minA, heavy reliance on UE support pushing on armrest; improved eccentric control into sitting  Ambulation/Gait Ambulation/Gait assistance: Min guard Gait Distance (Feet): 56 Feet Assistive device: Rolling walker (2 wheeled) Gait Pattern/deviations: Step-through pattern;Decreased stride length;Trunk flexed Gait velocity: Decreased   General Gait Details: Slow, fatigued gait with RW and min guard for balance; 1x seated rest break after prolonged standing at sink for ADL tasks, then able to ambulate laps in room with RW and min guard; pt declined hallway ambulation   Stairs             Wheelchair Mobility    Modified Rankin (Stroke Patients Only)       Balance Overall balance assessment: Needs assistance Sitting-balance support: No upper extremity supported;Feet supported Sitting balance-Leahy Scale: Good     Standing balance support: No upper extremity supported;During functional activity;Bilateral upper extremity supported Standing balance-Leahy Scale: Fair Standing balance comment: Can static stand without UE support; stability improved with at least single UE support for dynamic ADL tasks (wash up at sink); assist for posterior washup                            Cognition Arousal/Alertness: Awake/alert Behavior During Therapy: WFL for tasks assessed/performed Overall Cognitive Status: No family/caregiver present to determine baseline cognitive functioning                                 General Comments: Via Spanish interpreter. Pt with some confusion/inconsistencies regarding hospital timeline and PLOF details, would repeat  herself and correct details provided. Otherwise following commands and interacting appropriately; intermittent cues for safety      Exercises      General Comments General comments (skin integrity, edema, etc.): Pt reports MD is d/c her today. Pt to d/c to daughter's home, has no stairs,  will be driven by son; educ on recommendation to use RW instead of SPC for improved stability and decreased fall risk      Pertinent Vitals/Pain Pain Assessment: Faces Faces Pain Scale: Hurts a little bit Pain Location: BLEs Pain Descriptors / Indicators: Discomfort;Guarding Pain Intervention(s): Monitored during session    Home Living                      Prior Function            PT Goals (current goals can now be found in the care plan section) Progress towards PT goals: Progressing toward goals    Frequency    Min 3X/week      PT Plan Current plan remains appropriate    Co-evaluation              AM-PAC PT "6 Clicks" Mobility   Outcome Measure  Help needed turning from your back to your side while in a flat bed without using bedrails?: A Little Help needed moving from lying on your back to sitting on the side of a flat bed without using bedrails?: A Lot Help needed moving to and from a bed to a chair (including a wheelchair)?: A Little Help needed standing up from a chair using your arms (e.g., wheelchair or bedside chair)?: A Lot Help needed to walk in hospital room?: A Little Help needed climbing 3-5 steps with a railing? : A Lot 6 Click Score: 15    End of Session   Activity Tolerance: Patient tolerated treatment well Patient left: in chair;with call bell/phone within reach;with chair alarm set Nurse Communication: Mobility status PT Visit Diagnosis: Other abnormalities of gait and mobility (R26.89);Pain     Time: 3343-5686 PT Time Calculation (min) (ACUTE ONLY): 24 min  Charges:  $Gait Training: 8-22 mins $Therapeutic Activity: 8-22 mins                     Ina Homes, PT, DPT Acute Rehabilitation Services  Pager [phone] Office [phone]  Malachy Chamber 05/28/2020, 9:53 AM

## 2020-05-28 NOTE — Progress Notes (Signed)
   05/28/20 1750  Vitals  BP (!) 157/63  MAP (mmHg) 91  BP Location Right Arm  BP Method Automatic  Patient Position (if appropriate) Lying  Pulse Rate 75  Pulse Rate Source Monitor  ECG Heart Rate 75  Resp 17  MEWS COLOR  MEWS Score Color Green  Orthostatic Lying   BP- Lying 157/63  Pulse- Lying 75  Orthostatic Sitting  BP- Sitting 152/61  Pulse- Sitting 77  Orthostatic Standing at 0 minutes  BP- Standing at 0 minutes  (pt unable to stand)  Orthostatic Standing at 3 minutes  BP- Standing at 3 minutes  (pt unable to stand.)  Oxygen Therapy  SpO2 100 %  O2 Device Nasal Cannula  MEWS Score  MEWS Temp 0  MEWS Systolic 0  MEWS Pulse 0  MEWS RR 0  MEWS LOC 0  MEWS Score 0

## 2020-05-28 NOTE — Progress Notes (Signed)
Discharge and medication education given to pt and and grandson. Specific instructions regarding BG. Pt to check her BGs , document and take to her PCP. Pt and grandson verbalizes understanding.   Peripheral iv removed and dressing applied. Pt is currently dressed and waiting for 3in1.

## 2020-05-28 NOTE — Significant Event (Signed)
Rapid Response Event Note   Reason for Call :  Called to hospital entrance. Pt was being discharged by nurse tech. Staff was about to help her into the car and pt slumped over in the wheelchair. Staff leaned her back and noted emesis coming from her mouth. Pt does not remember event. Staff stated they were unable to get a response from the patient at time of event.   Initial Focused Assessment:  Pt sitting in wheelchair, eyes are closed. Skin is warm, moist, pale. She will mumble response to questions asked. She appears lethargic. Remains partially leaned over in wheelchair. No distress, no adventitious heart sounds. Denies pain. PERRLA, 39mm. No focal deficits noted.  Pt was escorted back to hospital room. VS checked and MD notified.   VS: T 98.44F, BP 114/96, HR 75, RR 14, SpO2 98% on room air  Interventions:  -EKG -CBG: 157  Plan of Care:  -New orders from MD -Close monitoring of patients mental status -Aspiration precautions  Call rapid response for additional needs Event Summary:  MD Notified: Dr. Janee Morn Call Time: 1700 Arrival Time: 1705 End Time: 1745  Jennye Moccasin, RN

## 2020-05-28 NOTE — TOC Transition Note (Signed)
Transition of Care [facility]) - CM/SW Discharge Note   Patient Details  Name: Meghan Ford MRN: [id number] Date of Birth: 1941/02/28  Transition of [facility]) CM/SW Contact:  Bess Kinds, RN Phone Number: [phone] 05/28/2020, 4:24 PM   Clinical Narrative:     Notified by CSW of plan for transition home and needing charity home health. Spoke with patient and grandson at bedside. Language Line interpreter [phone]) assisted with interpretation with patient. Patient verbalized to interpreter for CM to use her grandson, Jonetta Speak, as interpreter.   PTA home with grandson. Has 2 renters in her home. Has walkers, canes, grab bars, and 3N1. Discussed recommendations for shower chair and wheelchair. Patient agreeable. Discussed charity process for qualification. [employer] to deliver DME to home d/t no in hospital stock for wheelchairs today.   Discussed home health referral for RN, PT, OT. Advised of charity qualification prior to follow up. Jonetta Speak provided his contact number [phone], but stated his dad (also Jonetta Speak) would be best point of contact for financials [phone]. Referral called to [place] at [facility] for follow up.   Discharge medications sent to [facility] pharmacy. MATCH letter created - requested $0 copay. Medications delivered to room prior to discharge.   Patient is followed by [facility]. MD at [facility] provides medications as needed at no cost. Patient to follow up with PCP on 4/5.   Patient's daughter, Caprice Beaver, to provide transportation home. Mayra's contact number is [phone].   No further TOC needs identified at this time.  Final next level of care: Home w Home Health Services Barriers to Discharge: No Barriers Identified   Patient Goals and CMS Choice Patient states their goals for this hospitalization and ongoing recovery are:: return home CMS Medicare.gov Compare Post Acute Care list provided to:: Patient Choice offered to / list presented to : Patient,Adult  Children  Discharge Placement                       Discharge Plan and Services                DME Arranged: Wheelchair manual,Shower stool   Date DME Agency Contacted: 05/28/20 Time DME Agency Contacted: 1555 Representative spoke with at DME Agency: Velna Hatchet HH Arranged: OT,PT,RN HH Agency: [facility] (now known as [employer]) Date HH Agency Contacted: 05/28/20 Time HH Agency Contacted: 1430 Representative spoke with at [facility] Agency: [place]  Social Determinants of Health (SDOH) Interventions     Readmission Risk Interventions No flowsheet data found.

## 2020-05-29 ENCOUNTER — Inpatient Hospital Stay (HOSPITAL_COMMUNITY): Payer: Self-pay

## 2020-05-29 LAB — URINALYSIS, ROUTINE W REFLEX MICROSCOPIC
Bacteria, UA: NONE SEEN
Bilirubin Urine: NEGATIVE
Glucose, UA: 50 mg/dL — AB
Hgb urine dipstick: NEGATIVE
Ketones, ur: 5 mg/dL — AB
Leukocytes,Ua: NEGATIVE
Nitrite: NEGATIVE
Protein, ur: >=300 mg/dL — AB
Specific Gravity, Urine: 1.018 (ref 1.005–1.030)
pH: 6 (ref 5.0–8.0)

## 2020-05-29 LAB — CBC WITH DIFFERENTIAL/PLATELET
Abs Immature Granulocytes: 0.01 10*3/uL (ref 0.00–0.07)
Basophils Absolute: 0 10*3/uL (ref 0.0–0.1)
Basophils Relative: 0 %
Eosinophils Absolute: 0.1 10*3/uL (ref 0.0–0.5)
Eosinophils Relative: 2 %
HCT: 34.9 % — ABNORMAL LOW (ref 36.0–46.0)
Hemoglobin: 10.9 g/dL — ABNORMAL LOW (ref 12.0–15.0)
Immature Granulocytes: 0 %
Lymphocytes Relative: 31 %
Lymphs Abs: 2.3 10*3/uL (ref 0.7–4.0)
MCH: 26.5 pg (ref 26.0–34.0)
MCHC: 31.2 g/dL (ref 30.0–36.0)
MCV: 84.7 fL (ref 80.0–100.0)
Monocytes Absolute: 0.6 10*3/uL (ref 0.1–1.0)
Monocytes Relative: 8 %
Neutro Abs: 4.3 10*3/uL (ref 1.7–7.7)
Neutrophils Relative %: 59 %
Platelets: 261 10*3/uL (ref 150–400)
RBC: 4.12 MIL/uL (ref 3.87–5.11)
RDW: 15.9 % — ABNORMAL HIGH (ref 11.5–15.5)
WBC: 7.3 10*3/uL (ref 4.0–10.5)
nRBC: 0 % (ref 0.0–0.2)

## 2020-05-29 LAB — RENAL FUNCTION PANEL
Albumin: 2 g/dL — ABNORMAL LOW (ref 3.5–5.0)
Anion gap: 10 (ref 5–15)
BUN: 27 mg/dL — ABNORMAL HIGH (ref 8–23)
CO2: 28 mmol/L (ref 22–32)
Calcium: 8 mg/dL — ABNORMAL LOW (ref 8.9–10.3)
Chloride: 101 mmol/L (ref 98–111)
Creatinine, Ser: 2.25 mg/dL — ABNORMAL HIGH (ref 0.44–1.00)
GFR, Estimated: 22 mL/min — ABNORMAL LOW (ref >60)
Glucose, Bld: 94 mg/dL (ref 70–99)
Phosphorus: 5.3 mg/dL — ABNORMAL HIGH (ref 2.5–4.6)
Potassium: 3.9 mmol/L (ref 3.5–5.1)
Sodium: 139 mmol/L (ref 135–145)

## 2020-05-29 LAB — GLUCOSE, CAPILLARY
Glucose-Capillary: 104 mg/dL — ABNORMAL HIGH (ref 70–99)
Glucose-Capillary: 108 mg/dL — ABNORMAL HIGH (ref 70–99)
Glucose-Capillary: 108 mg/dL — ABNORMAL HIGH (ref 70–99)
Glucose-Capillary: 124 mg/dL — ABNORMAL HIGH (ref 70–99)
Glucose-Capillary: 88 mg/dL (ref 70–99)
Glucose-Capillary: 94 mg/dL (ref 70–99)

## 2020-05-29 MED ORDER — SODIUM CHLORIDE 0.9 % IV SOLN: INTRAVENOUS | Status: DC

## 2020-05-29 NOTE — Progress Notes (Addendum)
PROGRESS NOTE    Meghan Ford  SAY:[id number] DOB: 07/18/1940 DOA: 05/24/2020 PCP: Hoy Register, MD    Chief Complaint  Patient presents with  . Shortness of Breath  . Edema    Brief Narrative:  Meghan Ford was admitted to the hospital working diagnosis of acute diastolic heart failure exacerbation.  80 year old female past medical history for hypertension, type II diabetes mellitus, diastolic heart failure, dyslipidemia, peripheral vascular disease and history of CVA who presented with dyspnea along with lower extremity edema.  Reported 7 days of worsening of extremity edema, dyspnea and orthopnea.  On her initial physical examination blood pressure 200/170, heart rate 56, respiratory rate 20, oxygen saturation 95%. Her lungs have decreased breath sounds, positive rales bilaterally, no wheezing, heart S1-S2, present rhythmic, soft abdomen, positive 3+ pitting lower extremity edema.  Sodium 141, potassium 4.7, chloride 108, bicarb 26, glucose 107, BUN 19, creatinine 1.79, troponin I 28-29-21-14, BNP 157.  White count 8.1, hemoglobin 12.1, respiratory 9.4, platelets 258.  SARS COVID-19 negative.  Chest radiograph with hilar vascular congestion.  EKG 74 bpm, normal axis, normal intervals, sinus rhythm, no ST segment T wave changes.   Assessment & Plan:   Principal Problem:   Acute on chronic heart failure with preserved ejection fraction (HFpEF) (HCC) Active Problems:   Diabetes mellitus type II, uncontrolled (HCC)   Essential hypertension   Cerebrovascular accident (CVA) due to stenosis of right middle cerebral artery (HCC)   DM type 2 causing CKD stage 3 (HCC)   Class 3 obesity   Hypertensive emergency   Type 2 diabetes mellitus with diabetic polyneuropathy, with long-term current use of insulin (HCC)   Near syncope  1 acute on chronic diastolic heart failure Patient presented worsening shortness of breath, worsening lower extremity edema,  elevated blood pressure with systolics on presentation in the 200s.  EKG done with normal axis, normal sinus rhythm, no ischemic changes noted. -2D echo with EF 60 to 65%,NWMA, grade 1 DD, elevated left atrial pressure, normal pulmonary artery systolic pressure. -Patient with clinical improvement. -Patient with urine output of 900 cc over the past 24 hours. -Patient is -4.2 L during this hospitalization. -Current weight of 95.3 kg from 96.5 kg from 99.9 kg from 101.5 kg from 106.7 kg (3/15). -Patient was transitioned to oral diuretics which are currently on hold due to near syncopal episode from dehydration/overdiuresis.  -Continue Norvasc, hydralazine, statin, aspirin, Imdur. -Beta-blocker on hold due to concern for bradycardia. -Strict I's and O's.  Daily weights.  2.  Near syncope -Patient was to be discharged yesterday however noted to have a bout of emesis, significant weakness when being transported to her car for discharge in a stage discharge canceled. -Near syncopal episode secondary to overdiuresis/dehydration. -Acute abdominal series done unremarkable. -Patient with no focal neurological deficits.  Head CT pending. -Basic metabolic profile noted to have elevated BUN and creatinine and patient noted to be in acute kidney injury on chronic kidney disease likely secondary to overdiuresis. -Continue to hold diuretics. -Slowly clinically improving with gentle hydration. -Continue gentle hydration for another 24 hours. -Cardiac enzymes elevated but flattened likely demand. -PT/OT following.  3.  Hypertensive emergency Patient presented with systolic blood pressures in the 200s, and acute on chronic heart failure. -Systolic blood pressures improved on current regimen.   -Continue 10 mg daily, hydralazine 100 mg every 8 hours, Imdur 60 mg daily.  -Continue to hold diuretics due to overdiuresis and near syncopal episode.  -Beta-blocker on hold due to concerns for bradycardia.  4.   Acute kidney injury on chronic kidney disease stage IIIb Likely secondary to overdiuresis early on in the hospitalization.   -Patient noted with significant weakness yesterday as patient was to be discharged and discharge canceled.   -Creatinine elevated to 2.32 and slowly trickling down currently at 2.25.   -Diuretics on hold.   -Total of 900 cc over the past 24 hours.   -ACE inhibitor discontinued.   -Continue gentle hydration for another 24 hours.   -Follow renal function.   5.  Diabetes mellitus type 2 with neuropathy/hyperlipidemia Hemoglobin A1c 6.8. -CBG 88 this morning.  -Patient currently n.p.o. we will place on a heart healthy diet.   -SSI.   -Continue Neurontin, statin.   -Long-acting insulin discontinued will need close outpatient follow-up.   6.  Osteoarthritis Continue topical diclofenac to right knee.  PT/OT.  7.  History of CVA Stable.  Patient was to be discharged yesterday however when being transported downstairs to her car to go home had a bout of emesis, significant weakness and near syncopal episode and as such discharge canceled.  Head CT pending.  Continue aspirin, Plavix, statin.  Risk factor modification.  8.  Obesity class II-III Lifestyle modification.  Outpatient follow-up.  DVT prophylaxis: Heparin Code Status: Full Family Communication: Updated patient via Spanish interpreter.  No family at bedside.   Disposition:   Status is: Inpatient    Dispo: The patient is from: Home              Anticipated d/c is to: Home with home health              Patient currently with significant weakness, acute kidney injury, was to be discharged however due to significant weakness and emesis discharge canceled.  Currently not stable for discharge.     Difficult to place patient no       Consultants:   None  Procedures:   Chest x-ray 05/24/2020  2D echo 05/25/2020  CT head pending  Acute abdominal series 05/28/2020  Antimicrobials:    None   Subjective: Patient states feeling better than yesterday evening, with improvement with weakness. No CP. No SOB. Asking whether she can go home.   Objective: Vitals:   05/28/20 2020 05/29/20 0328 05/29/20 0535 05/29/20 0747  BP: (!) 146/73  (!) 148/52 126/63  Pulse:   81 78  Resp: 20  20 20   Temp: 98.9 F (37.2 C)  98.7 F (37.1 C) 98.7 F (37.1 C)  TempSrc: Oral  Oral Oral  SpO2: 98%  95% 96%  Weight:  95.3 kg    Height:        Intake/Output Summary (Last 24 hours) at 05/29/2020 1123 Last data filed at 05/29/2020 0542 Gross per 24 hour  Intake 826.02 ml  Output 400 ml  Net 426.02 ml   Filed Weights   05/27/20 0349 05/28/20 0134 05/29/20 0328  Weight: 99.9 kg 96.5 kg 95.3 kg    Examination:  General exam: NAD Respiratory system: Clear to auscultation bilaterally.  No wheezes, no crackles, no rhonchi.  Fair air movement.  Speaking in full sentences.  Cardiovascular system: Regular rate rhythm no murmurs rubs or gallops.  No lower extremity edema. Gastrointestinal system: Abdomen is soft, nontender, nondistended, positive bowel sounds.  No rebound.  No guarding.  Central nervous system: Alert and oriented.  Moving extremities spontaneously no focal neurological deficits.   Extremities: Symmetric 5 x 5 power. Skin: No rashes, lesions or ulcers Psychiatry: Judgement and insight appear  normal. Mood & affect appropriate.     Data Reviewed: I have personally reviewed following labs and imaging studies  CBC: Recent Labs  Lab 05/24/20 1731 05/25/20 0441 05/26/20 0823 05/27/20 0316 05/28/20 1757  WBC 8.1 7.4 7.9 7.6 7.7  NEUTROABS  --   --  4.8  --  5.3  HGB 12.1 10.2* 11.9* 11.5* 12.7  HCT 39.4 33.8* 38.6 35.6* 40.0  MCV 84.4 85.1 82.5 81.5 82.1  PLT 258 241 252 255 255    Basic Metabolic Panel: Recent Labs  Lab 05/26/20 0405 05/27/20 0316 05/28/20 0339 05/28/20 1757 05/29/20 0252  NA 140 139 139 136 139  K 4.1 3.7 4.0 4.6 3.9  CL 105 102 100  100 101  CO2 27 28 30 24 28   GLUCOSE 103* 131* 99 141* 94  BUN 20 19 22  28* 27*  CREATININE 1.71* 1.72* 1.93* 2.32* 2.25*  CALCIUM 8.0* 8.0* 8.1* 8.1* 8.0*  MG  --   --   --  2.1  --   PHOS  --   --   --   --  5.3*    GFR: Estimated Creatinine Clearance: 23.9 mL/min (A) (by C-G formula based on SCr of 2.25 mg/dL (H)).  Liver Function Tests: Recent Labs  Lab 05/28/20 1757 05/29/20 0252  AST 34  --   ALT 16  --   ALKPHOS 79  --   BILITOT 1.0  --   PROT 5.6*  --   ALBUMIN 2.3* 2.0*    CBG: Recent Labs  Lab 05/28/20 1715 05/28/20 2123 05/29/20 0018 05/29/20 0415 05/29/20 0750  GLUCAP 157* 120* 108* 88 94     Recent Results (from the past 240 hour(s))  SARS CORONAVIRUS 2 (TAT 6-24 HRS) Nasopharyngeal Nasopharyngeal Swab     Status: None   Collection Time: 05/25/20  2:57 AM   Specimen: Nasopharyngeal Swab  Result Value Ref Range Status   SARS Coronavirus 2 NEGATIVE NEGATIVE Final    Comment: (NOTE) SARS-CoV-2 target nucleic acids are NOT DETECTED.  The SARS-CoV-2 RNA is generally detectable in upper and lower respiratory specimens during the acute phase of infection. Negative results do not preclude SARS-CoV-2 infection, do not rule out co-infections with other pathogens, and should not be used as the sole basis for treatment or other patient management decisions. Negative results must be combined with clinical observations, patient history, and epidemiological information. The expected result is Negative.  Fact Sheet for Patients: 05/31/20  Fact Sheet for Healthcare Providers: 05/27/20  This test is not yet approved or cleared by the [place] FDA and  has been authorized for detection and/or diagnosis of SARS-CoV-2 by FDA under an Emergency Use Authorization (EUA). This EUA will remain  in effect (meaning this test can be used) for the duration of the COVID-19 declaration under Se ction  564(b)(1) of the Act, 21 U.S.C. section 360bbb-3(b)(1), unless the authorization is terminated or revoked sooner.  Performed at [facility], [address], [place], [address] [place]          Radiology Studies: DG ABD ACUTE 2+V W 1V CHEST  Result Date: 05/28/2020 CLINICAL DATA:  Near syncope EXAM: DG ABDOMEN ACUTE WITH 1 VIEW CHEST COMPARISON:  CT 03/27/2019 FINDINGS: Cardiomegaly, vascular congestion. Aortic atherosclerosis. No confluent opacities or effusions. Nonobstructive bowel gas pattern. No organomegaly or free air. No suspicious calcification. IMPRESSION: Cardiomegaly, vascular congestion. No evidence of bowel obstruction or free air. Electronically Signed   By: 05/30/2020 M.D.  On: 05/28/2020 18:38        Scheduled Meds: . amLODipine  10 mg Oral Daily  . aspirin EC  81 mg Oral Daily  . atorvastatin  80 mg Oral Daily  . clopidogrel  75 mg Oral Daily  . diclofenac Sodium  2 g Topical TID  . gabapentin  300 mg Oral Daily  . heparin  5,000 Units Subcutaneous Q8H  . hydrALAZINE  100 mg Oral Q8H  . insulin aspart  0-15 Units Subcutaneous Q4H  . isosorbide mononitrate  60 mg Oral Daily  . sodium chloride flush  3 mL Intravenous Q12H   Continuous Infusions: . sodium chloride    . sodium chloride 75 mL/hr at 05/28/20 2245     LOS: 4 days    Time spent: 40 minutes    Ramiro Harvestaniel Olvin Rohr, MD [facility]   To contact the attending provider between 7A-7P or the covering provider during after hours 7P-7A, please log into the web site [email or web address] and access using universal [place] password for that web site. If you do not have the password, please call the hospital operator.  05/29/2020, 11:23 AM

## 2020-05-29 NOTE — Progress Notes (Signed)
RN in to provide medications for patient. Swallow eval completed with water. Patient took a sip of water with no coughing or decrease in oxygen levels. Medications presented to patient and patient swallowed medications with no complications.   RN will continue to monitor.

## 2020-05-29 NOTE — Progress Notes (Signed)
RN in to assess patient. [place] interpreter used to address patients needs. Patient sitting on side of the bed requesting breakfast and to brush her teeth this morning. Patient reports that she feels fine. RN informed patient that MD will need to assess and speak with her before breakfast can be ordered. Patient stated understanding and reports no other needs at this time. RN will continue to monitor.

## 2020-05-29 NOTE — Progress Notes (Signed)
Attempted to do in and out catheterization to obtain urine sample. 2 failed attempts were made. Pt refused another attempt. MD notified.

## 2020-05-30 DIAGNOSIS — I6340 Cerebral infarction due to embolism of unspecified cerebral artery: Secondary | ICD-10-CM

## 2020-05-30 LAB — BASIC METABOLIC PANEL
Anion gap: 10 (ref 5–15)
BUN: 27 mg/dL — ABNORMAL HIGH (ref 8–23)
CO2: 27 mmol/L (ref 22–32)
Calcium: 8.2 mg/dL — ABNORMAL LOW (ref 8.9–10.3)
Chloride: 104 mmol/L (ref 98–111)
Creatinine, Ser: 1.89 mg/dL — ABNORMAL HIGH (ref 0.44–1.00)
GFR, Estimated: 27 mL/min — ABNORMAL LOW (ref >60)
Glucose, Bld: 98 mg/dL (ref 70–99)
Potassium: 3.8 mmol/L (ref 3.5–5.1)
Sodium: 141 mmol/L (ref 135–145)

## 2020-05-30 LAB — GLUCOSE, CAPILLARY
Glucose-Capillary: 115 mg/dL — ABNORMAL HIGH (ref 70–99)
Glucose-Capillary: 86 mg/dL (ref 70–99)
Glucose-Capillary: 87 mg/dL (ref 70–99)
Glucose-Capillary: 89 mg/dL (ref 70–99)

## 2020-05-30 MED ORDER — FUROSEMIDE 40 MG PO TABS: 40.0000 mg | ORAL_TABLET | Freq: Every day | ORAL | 1 refills | Status: AC

## 2020-05-30 NOTE — Progress Notes (Signed)
Patient son, Alecia Lemming, at bedside during discharge AVS education. Son able to confirm that DME at home as patient was originally to discharge on Friday. This RN also confirmed that TOC meds at bedside were still accurate to AVS. Questions answered and patient escorted to main entrance via wheelchair.

## 2020-05-30 NOTE — Discharge Summary (Signed)
Physician Discharge Summary  Meghan Ford NWG:[id number] DOB: 1940/06/09 DOA: 05/24/2020  PCP: Charlott Rakes, MD  Admit date: 05/24/2020 Discharge date: 05/30/2020  Time spent: 55 minutes  Recommendations for Outpatient Follow-up:  1. Follow-up with Charlott Rakes, MD on 06/15/2020 at 3:30 PM.  On follow-up patient will need a basic metabolic profile done to follow-up on electrolytes and renal function.  Patient's diabetes will need to be reassessed as patient's blood glucose levels were well controlled on sliding scale insulin only during the hospitalization and patient's long-acting insulin discontinued on discharge.  Patient's blood pressure also needs to be followed up upon.  Patient may benefit from outpatient referral to cardiology.  Patient will need refills on her medications on follow-up.   Discharge Diagnoses:  Principal Problem:   Acute on chronic heart failure with preserved ejection fraction (HFpEF) (HCC) Active Problems:   Diabetes mellitus type II, uncontrolled ([place])   Essential hypertension   Cerebrovascular accident (CVA) due to stenosis of right middle cerebral artery ([place])   DM type 2 causing CKD stage 3 ([place])   Class 3 obesity   Hypertensive emergency   Type 2 diabetes mellitus with diabetic polyneuropathy, with long-term current use of insulin ([place])   Near syncope   Discharge Condition: Stable and improved  Diet recommendation: Heart healthy  Filed Weights   05/28/20 0134 05/29/20 0328 05/30/20 0221  Weight: 96.5 kg 95.3 kg 95 kg    History of present illness:  HPI per Dr.Chotiner Meghan Ford is a 80 y.o. female with medical history significant for HTN, DMT2, HFpEF, HLD, PVD, hx of CVA who presented with complaint of increasing SOB and edema of legs. Pt speaks limited english. Seen with aide of interpretor. She reported increased edema of her legs over the last week with increasing shortness of breath.  She also reported having  increased feeling of smothering when she lays flat which is not normal for her.  Denied any chest pain or palpitations.  Has not had any recent fever, chills, abdominal pain, nausea, vomiting, diarrhea.  She denied any dysuria or flank pain.  She reported has been taking her medications as prescribed. Her last echocardiogram was in 2019 and at that time she had a normal EF. Denies tobacco, ETOH use. Lives with family.   ED Course: In the emergency room she was found to have a significant elevated blood pressure over 200/100.  She was given labetalol IV in the emergency room.  She was also placed on nitroglycerin paste.  Hospital Course:  1 acute on chronic diastolic heart failure Patient presented worsening shortness of breath, worsening lower extremity edema, elevated blood pressure with systolics on presentation in the 200s.  EKG done with normal axis, normal sinus rhythm, no ischemic changes noted. -Patient noted to have run out of her medications prior to admission and was awaiting outpatient follow-up with PCP to get those refilled  could not get an appointment till May. -2D echo with EF 60 to 65%,NWMA, grade 1 DD, elevated left atrial pressure, normal pulmonary artery systolic pressure. -Patient placed on IV Lasix with good diuresis and clinical improvement during the hospitalization.   -Patient noted to be -4.6 L during this hospitalization.   -Patient improved clinically and was satting 95% on room air by day of discharge.   -Patient's weight on day of discharge was 95 kg from 106.7 kg on admission  -ACE inhibitor was discontinued due to acute kidney injury.   -Patient maintained on Norvasc, hydralazine, Imdur, statin, aspirin during the  hospitalization.   -Beta-blocker not started due to concerns for bradycardia.   -Patient initially was to be discharged 05/28/2020 however patient had a near syncopal episode and discharge canceled.  Near syncopal episode felt secondary to  dehydration/overdiuresis. -Oral diuretics were held and will be resumed 2 days post discharge. -Outpatient follow-up with PCP.   -On follow-up may benefit from outpatient referral to cardiology.  2.  Near syncopal episode -Patient was to be discharged 05/28/2020, however noted to have a bout of emesis, significant weakness when being transported to her car for discharge in a stage discharge canceled. -Near syncopal episode secondary to overdiuresis/dehydration. -Acute abdominal series done unremarkable. -Patient with no focal neurological deficits.  Head CT done with no acute abnormalities noted.  -Basic metabolic profile noted to have elevated BUN and creatinine and patient noted to be in acute kidney injury on chronic kidney disease likely secondary to overdiuresis. -Patient diuretics were held for the rest of the hospitalization will not be resumed until 2 days post discharge.   -Cardiac enzymes cycled were negative. -Patient gently hydrated and improved clinically was close to baseline by day of discharge.   3.  Hypertensive emergency Patient presented with systolic blood pressures in the 200s, and acute on chronic heart failure. -On day of discharge patient did state had run out of her medications and was awaiting follow-up with PCP in order to have them refilled. -Patient initially placed on a nitro patch.   -Patient was placed on increased dose of Norvasc at 10 mg daily.  Home regimen of Imdur 60 mg daily resumed.  Patient was also on IV Lasix for diuresis.   -For better blood pressure control patient started on hydralazine which was uptitrated to 100 mg 3 times a day with better blood pressure control.   -Patient's ACE inhibitor discontinued during the hospitalization due to acute kidney injury.   -Outpatient follow-up with PCP for blood pressure control.  4.  Acute kidney injury on chronic kidney disease stage IIIb Likely secondary to problem #1.  Patient with good urine output  with diuresis.  -Patient's ACE inhibitor was held throughout the hospitalization and will not be resumed on discharge.   -Patient was diuresed with IV Lasix and subsequently transitioned to oral Lasix on discharge.   -Patient initially was to be discharged on 05/28/2020 however became significantly weak with near syncopal episode and discharge canceled. -Repeat labs obtained showed worsening renal function with creatinine going up as high as 2.32. -Diuretics were held and patient gently hydrated. -Renal function improved creatinine was down to 1.89 by day of discharge. -Patient is to resume diuretics of Lasix 2 days post discharge. -Outpatient follow-up with PCP.   5.  Diabetes mellitus type 2 with neuropathy/hyperlipidemia Hemoglobin A1c 6.8. -Patient blood glucose levels were well controlled during the hospitalization on sliding scale insulin.  Patient also placed back on home regimen Neurontin and maintained on statin.   -Patient CBGs throughout the hospitalization ranged from 99 -160s on sliding scale insulin.   -Patient's long-acting insulin will be discontinued on discharge.   -Patient is to check blood glucose levels 4 times daily and make a log and showed a log to PCP on follow-up.   -On follow-up PCP will determine whether patient needs to go back on the insulin versus oral hypoglycemic agents.   -Patient was discharged in stable and improved condition.  Outpatient follow-up with PCP.  6.  Osteoarthritis Patient placed on topical diclofenac to right knee.  PT/OT assess patient and recommended  home health therapies..  7.  History of CVA Remained stable throughout the hospitalization.  Patient was maintained on home regimen of aspirin, Plavix, statin.  Risk factor modification.  Patient initially was to be discharged on 05/28/2020 however had significant generalized weakness and near syncopal episode and as such discharge was canceled.  Repeat head CT which was done was negative.   Outpatient follow-up with PCP.   8.  Obesity class II-III Lifestyle modification.  Outpatient follow-up.   Procedures:  Chest x-ray 05/24/2020  2D echo 05/25/2020    Consultations:  None  Discharge Exam: Vitals:   05/30/20 0800 05/30/20 1200  BP: (!) 177/78 139/65  Pulse: 77 76  Resp: (!) 21 (!) 22  Temp: 97.9 F (36.6 C) 98.3 F (36.8 C)  SpO2: 94% 100%    General: NAD Cardiovascular: RRR Respiratory: CTAB  Discharge Instructions   Discharge Instructions    Diet - low sodium heart healthy   Complete by: As directed    Increase activity slowly   Complete by: As directed    Increase activity slowly   Complete by: As directed      Allergies as of 05/30/2020   No Known Allergies     Medication List    STOP taking these medications   aspirin 81 MG tablet Replaced by: aspirin 81 MG EC tablet   insulin glargine 100 UNIT/ML injection Commonly known as: LANTUS   lisinopril 20 MG tablet Commonly known as: ZESTRIL   naproxen 500 MG tablet Commonly known as: Naprosyn     TAKE these medications   amLODipine 10 MG tablet Commonly known as: NORVASC Take 1 tablet (10 mg total) by mouth daily. What changed: how much to take   aspirin 81 MG EC tablet Take 1 tablet (81 mg total) by mouth daily. Swallow whole. Replaces: aspirin 81 MG tablet   atorvastatin 80 MG tablet Commonly known as: LIPITOR Take 1 tablet (80 mg total) by mouth daily.   blood glucose meter kit and supplies Kit Dispense based on patient and insurance preference. Use up to four times daily as directed. (FOR ICD-9 250.00, 250.01).   clopidogrel 75 MG tablet Commonly known as: PLAVIX Take 1 tablet (75 mg total) by mouth daily. Start taking Plavix on 03/31/2019.   diclofenac Sodium 1 % Gel Commonly known as: VOLTAREN Apply 2 g topically 3 (three) times daily.   furosemide 40 MG tablet Commonly known as: LASIX Take 1 tablet (40 mg total) by mouth daily. Start taking on: June 01, 2020   gabapentin 300 MG capsule Commonly known as: NEURONTIN Take 1 capsule (300 mg total) by mouth at bedtime.   glucose blood test strip Use as instructed   True Metrix Blood Glucose Test test strip Generic drug: glucose blood Use 3 times daily before meals   hydrALAZINE 100 MG tablet Commonly known as: APRESOLINE Take 1 tablet (100 mg total) by mouth every 8 (eight) hours.   Insulin Syringes (Disposable) U-100 0.3 ML Misc USE AS DIRECTED   isosorbide mononitrate 60 MG 24 hr tablet Commonly known as: IMDUR Take 1 tablet (60 mg total) by mouth daily.   senna-docusate 8.6-50 MG tablet Commonly known as: Senokot-S Take 1 tablet by mouth at bedtime as needed for mild constipation.   True Metrix Meter Devi 1 each by Does not apply route 3 (three) times daily before meals.   TRUEplus Insulin Syringe 31G X 5/16" 0.5 ML Misc Generic drug: Insulin Syringe-Needle U-100 USE AS DIRECTED.   TRUEplus  Lancets 28G Misc 1 each by Does not apply route 3 (three) times daily before meals.            Durable Medical Equipment  (From admission, onward)         Start     Ordered   05/27/20 1713  For home use only DME lightweight manual wheelchair with seat cushion  Once       Comments: Patient suffers from debility/diabetic neuropathy which impairs their ability to perform daily activities in the home.  A walking aid will not resolve  issue with performing activities of daily living. A wheelchair will allow patient to safely perform daily activities. Patient is not able to propel themselves in the home using a standard weight wheelchair due to weakness. Patient can self propel in the lightweight wheelchair. Length of need 6 months. Accessories: elevating leg rests (ELRs), wheel locks, extensions and anti-tippers.   05/27/20 1713   05/27/20 1711  For home use only DME Shower stool  Once        05/27/20 1710   05/27/20 1711  For home use only DME 3 n 1  Once        05/27/20 1710          No Known Allergies  Follow-up Information    Charlott Rakes, MD. Schedule an appointment as soon as possible for a visit on 06/15/2020.   Specialty: Family Medicine Why: Follow-up in 1 to 2 weeks.$RemoveBe'@3'EMNneLCNL$ :30pm Contact information: [place] [address] [phone]                The results of significant diagnostics from this hospitalization (including imaging, microbiology, ancillary and laboratory) are listed below for reference.    Significant Diagnostic Studies: DG Chest 2 View  Result Date: 05/24/2020 CLINICAL DATA:  Shortness of breath and lower extremity swelling x3 days EXAM: CHEST - 2 VIEW COMPARISON:  Chest radiograph February 16, 2020. FINDINGS: Cardiac monitoring device overlies the left chest. Enlarged cardiac silhouette. Left basilar opacity with obscuration left hemidiaphragm. Small left pleural effusion. Left reverse shoulder arthroplasty. IMPRESSION: Left basilar opacity and small left pleural effusion. Electronically Signed   By: Dahlia Bailiff MD   On: 05/24/2020 18:03   CT HEAD WO CONTRAST  Result Date: 05/29/2020 CLINICAL DATA:  Syncopal episode with normal neurological exam. EXAM: CT HEAD WITHOUT CONTRAST TECHNIQUE: Contiguous axial images were obtained from the base of the skull through the vertex without intravenous contrast. COMPARISON:  02/16/2020 FINDINGS: Brain: Generalized atrophy. No focal abnormality affects the brainstem or cerebellum. Old infarction in the right occipital lobe and right posterior parietal lobe with atrophy, encephalomalacia and gliosis. Chronic small-vessel ischemic changes of the basal ganglia and hemispheric white matter. No acute infarction, mass lesion, hemorrhage, hydrocephalus or extra-axial collection Vascular: There is atherosclerotic calcification of the major vessels at the base of the brain. Skull: Negative Sinuses/Orbits: Clear/normal Other: None IMPRESSION: No acute finding by CT. Old infarction in the  right occipital lobe and right posterior parietal lobe with atrophy, encephalomalacia and gliosis. Chronic small-vessel ischemic changes of the basal ganglia and hemispheric white matter. Electronically Signed   By: Nelson Chimes M.D.   On: 05/29/2020 12:19   DG ABD ACUTE 2+V W 1V CHEST  Result Date: 05/28/2020 CLINICAL DATA:  Near syncope EXAM: DG ABDOMEN ACUTE WITH 1 VIEW CHEST COMPARISON:  CT 03/27/2019 FINDINGS: Cardiomegaly, vascular congestion. Aortic atherosclerosis. No confluent opacities or effusions. Nonobstructive bowel gas pattern. No organomegaly or free air. No  suspicious calcification. IMPRESSION: Cardiomegaly, vascular congestion. No evidence of bowel obstruction or free air. Electronically Signed   By: Rolm Baptise M.D.   On: 05/28/2020 18:38   ECHOCARDIOGRAM COMPLETE  Result Date: 05/25/2020    ECHOCARDIOGRAM REPORT   Patient Name:   Meghan Ford Date of Exam: 05/25/2020 Medical Rec #:  [id number]                 Height:       65.0 in Accession #:    [id number]                Weight:       227.4 lb Date of Birth:  [birth year]                 BSA:          2.089 m Patient Age:    9 years                  BP:           163/92 mmHg Patient Gender: F                         HR:           58 bpm. Exam Location:  Inpatient Procedure: 2D Echo, Cardiac Doppler and Color Doppler Indications:    Z60.10 Chronic systolic (congestive) heart failure  History:        Patient has prior history of Echocardiogram examinations, most                 recent 05/09/2017. Stroke; Risk Factors:Hypertension, Diabetes                 and Dyslipidemia. Sepsis.  Sonographer:    Jonelle Sidle Dance Referring Phys: [id number] [place]  1. Left ventricular ejection fraction, by estimation, is 60 to 65%. The left ventricle has normal function. The left ventricle has no regional wall motion abnormalities. Left ventricular diastolic parameters are consistent with Grade I diastolic dysfunction (impaired  relaxation). Elevated left atrial pressure.  2. Right ventricular systolic function is normal. The right ventricular size is normal. There is normal pulmonary artery systolic pressure. The estimated right ventricular systolic pressure is 32.2 mmHg.  3. Left atrial size was moderately dilated.  4. The mitral valve is normal in structure. Trivial mitral valve regurgitation. No evidence of mitral stenosis.  5. The aortic valve is normal in structure. Aortic valve regurgitation is not visualized. Mild aortic valve sclerosis is present, with no evidence of aortic valve stenosis.  6. The inferior vena cava is dilated in size with >50% respiratory variability, suggesting right atrial pressure of 8 mmHg. Comparison(s): No significant change from prior study. Prior images reviewed side by side. FINDINGS  Left Ventricle: Left ventricular ejection fraction, by estimation, is 60 to 65%. The left ventricle has normal function. The left ventricle has no regional wall motion abnormalities. The left ventricular internal cavity size was normal in size. There is  no left ventricular hypertrophy. Left ventricular diastolic parameters are consistent with Grade I diastolic dysfunction (impaired relaxation). Elevated left atrial pressure. Right Ventricle: The right ventricular size is normal. No increase in right ventricular wall thickness. Right ventricular systolic function is normal. There is normal pulmonary artery systolic pressure. The tricuspid regurgitant velocity is 2.42 m/s, and  with an assumed right atrial pressure of 8 mmHg, the estimated right ventricular systolic pressure is 02.5 mmHg. Left Atrium:  Left atrial size was moderately dilated. Right Atrium: Right atrial size was normal in size. Pericardium: There is no evidence of pericardial effusion. Mitral Valve: The mitral valve is normal in structure. Mild to moderate mitral annular calcification. Trivial mitral valve regurgitation. No evidence of mitral valve stenosis.  Tricuspid Valve: The tricuspid valve is normal in structure. Tricuspid valve regurgitation is not demonstrated. No evidence of tricuspid stenosis. Aortic Valve: The aortic valve is normal in structure. Aortic valve regurgitation is not visualized. Aortic regurgitation PHT measures 710 msec. Mild aortic valve sclerosis is present, with no evidence of aortic valve stenosis. Pulmonic Valve: The pulmonic valve was normal in structure. Pulmonic valve regurgitation is not visualized. No evidence of pulmonic stenosis. Aorta: The aortic root is normal in size and structure. Venous: The inferior vena cava is dilated in size with greater than 50% respiratory variability, suggesting right atrial pressure of 8 mmHg. IAS/Shunts: No atrial level shunt detected by color flow Doppler.  LEFT VENTRICLE PLAX 2D LVIDd:         4.10 cm  Diastology LVIDs:         3.00 cm  LV e' medial:    5.59 cm/s LV PW:         1.50 cm  LV E/e' medial:  15.4 LV IVS:        1.30 cm  LV e' lateral:   5.78 cm/s LVOT diam:     2.30 cm  LV E/e' lateral: 14.9 LV SV:         137 LV SV Index:   65 LVOT Area:     4.15 cm  RIGHT VENTRICLE             IVC RV Basal diam:  2.70 cm     IVC diam: 2.40 cm RV S prime:     13.10 cm/s TAPSE (M-mode): 2.0 cm LEFT ATRIUM             Index       RIGHT ATRIUM           Index LA diam:        4.80 cm 2.30 cm/m  RA Area:     11.30 cm LA Vol (A2C):   87.1 ml 41.69 ml/m RA Volume:   23.40 ml  11.20 ml/m LA Vol (A4C):   56.8 ml 27.19 ml/m LA Biplane Vol: 70.4 ml 33.70 ml/m  AORTIC VALVE LVOT Vmax:   117.00 cm/s LVOT Vmean:  78.100 cm/s LVOT VTI:    0.329 m AI PHT:      710 msec  AORTA Ao Root diam: 3.50 cm Ao Asc diam:  3.60 cm MITRAL VALVE                TRICUSPID VALVE MV Area (PHT): 2.13 cm     TR Peak grad:   23.5 mmHg MV Decel Time: 356 msec     TR Vmax:        242.16 cm/s MV E velocity: 86.20 cm/s MV A velocity: 109.00 cm/s  SHUNTS MV E/A ratio:  0.79         Systemic VTI:  0.33 m                             Systemic  Diam: 2.30 cm Dani Gobble Croitoru MD Electronically signed by Sanda Klein MD Signature Date/Time: 05/25/2020/1:02:42 PM    Final     Microbiology: Recent Results (from the past 240  hour(s))  SARS CORONAVIRUS 2 (TAT 6-24 HRS) Nasopharyngeal Nasopharyngeal Swab     Status: None   Collection Time: 05/25/20  2:57 AM   Specimen: Nasopharyngeal Swab  Result Value Ref Range Status   SARS Coronavirus 2 NEGATIVE NEGATIVE Final    Comment: (NOTE) SARS-CoV-2 target nucleic acids are NOT DETECTED.  The SARS-CoV-2 RNA is generally detectable in upper and lower respiratory specimens during the acute phase of infection. Negative results do not preclude SARS-CoV-2 infection, do not rule out co-infections with other pathogens, and should not be used as the sole basis for treatment or other patient management decisions. Negative results must be combined with clinical observations, patient history, and epidemiological information. The expected result is Negative.  Fact Sheet for Patients: SugarRoll.be  Fact Sheet for Healthcare Providers: https://www.woods-mathews.com/  This test is not yet approved or cleared by the [place] FDA and  has been authorized for detection and/or diagnosis of SARS-CoV-2 by FDA under an Emergency Use Authorization (EUA). This EUA will remain  in effect (meaning this test can be used) for the duration of the COVID-19 declaration under Se ction 564(b)(1) of the Act, 21 U.S.C. section 360bbb-3(b)(1), unless the authorization is terminated or revoked sooner.  Performed at [facility], [place] [address], [place], [place] [address]   Culture, Urine     Status: Abnormal (Preliminary result)   Collection Time: 05/29/20  4:30 PM   Specimen: Urine, Catheterized  Result Value Ref Range Status   Specimen Description URINE, CATHETERIZED  Final   Special Requests NONE  Final   Culture (A)  Final    60,000 COLONIES/mL GRAM NEGATIVE  RODS SUSCEPTIBILITIES TO FOLLOW Performed at [facility], [place] [address], [place], [place] [address]    Report Status PENDING  Incomplete     Labs: Basic Metabolic Panel: Recent Labs  Lab 05/27/20 0316 05/28/20 0339 05/28/20 1757 05/29/20 0252 05/30/20 0224  NA 139 139 136 139 141  K 3.7 4.0 4.6 3.9 3.8  CL 102 100 100 101 104  CO2 $Re'28 30 24 28 27  'OWc$ GLUCOSE 131* 99 141* 94 98  BUN 19 22 28* 27* 27*  CREATININE 1.72* 1.93* 2.32* 2.25* 1.89*  CALCIUM 8.0* 8.1* 8.1* 8.0* 8.2*  MG  --   --  2.1  --   --   PHOS  --   --   --  5.3*  --    Liver Function Tests: Recent Labs  Lab 05/28/20 1757 05/29/20 0252  AST 34  --   ALT 16  --   ALKPHOS 79  --   BILITOT 1.0  --   PROT 5.6*  --   ALBUMIN 2.3* 2.0*   No results for input(s): LIPASE, AMYLASE in the last 168 hours. No results for input(s): AMMONIA in the last 168 hours. CBC: Recent Labs  Lab 05/25/20 0441 05/26/20 0823 05/27/20 0316 05/28/20 1757 05/29/20 0253  WBC 7.4 7.9 7.6 7.7 7.3  NEUTROABS  --  4.8  --  5.3 4.3  HGB 10.2* 11.9* 11.5* 12.7 10.9*  HCT 33.8* 38.6 35.6* 40.0 34.9*  MCV 85.1 82.5 81.5 82.1 84.7  PLT 241 252 255 255 261   Cardiac Enzymes: No results for input(s): CKTOTAL, CKMB, CKMBINDEX, TROPONINI in the last 168 hours. BNP: BNP (last 3 results) Recent Labs    02/16/20 2033 05/24/20 2305  BNP 47.9 157.8*    ProBNP (last 3 results) No results for input(s): PROBNP in the last 8760 hours.  CBG: Recent Labs  Lab 05/29/20 2101 05/30/20 0025 05/30/20 0403 05/30/20 0744 05/30/20 1203  GLUCAP 104* 86 87 89 115*       Signed:  Irine Seal MD.  [facility] 05/30/2020, 2:16 PM

## 2020-05-31 ENCOUNTER — Telehealth: Payer: Self-pay

## 2020-05-31 LAB — URINE CULTURE: Culture: 60000 — AB

## 2020-05-31 NOTE — Telephone Encounter (Signed)
Transition Care Management Unsuccessful Follow-up Telephone Call  Call placed with assistance of Spanish interpreter # [id number]/[employer] Date of discharge and from where:  05/30/2020, [facility]   Attempts:  1st Attempt  Reason for unsuccessful TCM follow-up call:  Left voice messages on # [phone] and [phone]. These are the numbers for the patient , her sonAlecia Lemming) and daughter in law, [name].  Call back requested to this CM.  Patient has appointment with Dr Alvis Lemmings 06/15/2020.

## 2020-06-01 ENCOUNTER — Telehealth: Payer: Self-pay

## 2020-06-01 ENCOUNTER — Other Ambulatory Visit: Payer: Self-pay

## 2020-06-01 NOTE — Telephone Encounter (Signed)
Transition Care Management Unsuccessful Follow-up Telephone Call  Date of discharge and from where:  05/30/2020, [facility]   Attempts:  2nd Attempt  Reason for unsuccessful TCM follow-up call:  Called pt  [phone] person answered stated is her daughter in low , currently not with pt. Message given will call back tomorrow or pt can reach this nurse at [phone].   Patient has appointment with Dr Alvis Lemmings 06/15/2020.

## 2020-06-02 ENCOUNTER — Telehealth: Payer: Self-pay

## 2020-06-02 ENCOUNTER — Other Ambulatory Visit: Payer: Self-pay | Admitting: Family Medicine

## 2020-06-02 MED ORDER — CEPHALEXIN 500 MG PO CAPS: 500.0000 mg | ORAL_CAPSULE | Freq: Two times a day (BID) | ORAL | 0 refills | Status: DC

## 2020-06-02 NOTE — Telephone Encounter (Signed)
Called pt . DOB and name confirmed. Mother not present. (Patient authorizes [facility] to verbally disclose patient's protected health information to Mayra Warren Lacy and Kassie Mends) Vistor Vasquez(pt son ) answered.  Transition Care Management Follow-up Telephone Call  Date of discharge and from where:[facility] on 05/30/2020 How have you been since you were released from the hospital? A little better  Any questions or concerns? No questions/concerns reported.  Items Reviewed: Did the pt receive and understand the discharge instructions provided?Son  Stated that have the instructions and have no questions.  Medications obtained and verified? Son  said that they have the medication list and the hospital staff reviewed them in detail prior to discharge. He said that he has all of the medications and  have no questions.  Any new allergies since your discharge? None reported  Do you have support at home? Yes, son and daughter in low  Other (ie: DME, Home Health, etc)       Functional Questionnaire: (I = Independent and D = Dependent) ADL's:  Independent.      Follow up appointments reviewed:   PCP Hospital f/u appt confirmed? Dr Alvis Lemmings 06/15/2020@ 3:30  Specialist Hospital f/u appt confirmed?  Son stated that are scheduled at this time  Are transportation arrangements needed? Per son yes.  They have transportation   If their condition worsens, is the pt aware to call  their PCP or go to the ED? Yes.Made pt aware if condition worsen or start experiencing rapid weight gain, chest pain, diff breathing, SOB, high fevers, or bleading to refer imediately to ED for further evaluation.  Was the patient provided with contact information for the PCP's office or ED? He has the phone number  Was the pt encouraged to call back with questions or concerns?yes

## 2020-06-03 MED FILL — CEPHALEXIN 500 MG CAPSULE: 500 | 3 days supply | Qty: 6 | Fill #0

## 2020-06-04 ENCOUNTER — Telehealth: Payer: Self-pay

## 2020-06-04 NOTE — Telephone Encounter (Signed)
-----   Message from Hoy Register, MD sent at 06/02/2020  5:36 PM EDT ----- Her urine culture during hospitalization revealed presence of bacteria.  I have sent a prescription for antibiotics to the pharmacy.

## 2020-06-04 NOTE — Telephone Encounter (Signed)
Pt was called and a VM was left informing pt of lab results. 

## 2020-06-12 ENCOUNTER — Other Ambulatory Visit: Payer: Self-pay

## 2020-06-15 ENCOUNTER — Ambulatory Visit: Payer: Self-pay | Admitting: Family Medicine

## 2020-06-20 ENCOUNTER — Encounter (HOSPITAL_COMMUNITY): Payer: Self-pay | Admitting: Emergency Medicine

## 2020-06-20 ENCOUNTER — Inpatient Hospital Stay (HOSPITAL_COMMUNITY): Payer: No Typology Code available for payment source

## 2020-06-20 ENCOUNTER — Emergency Department (HOSPITAL_COMMUNITY): Payer: No Typology Code available for payment source

## 2020-06-20 ENCOUNTER — Inpatient Hospital Stay (HOSPITAL_COMMUNITY)
Admission: EM | Admit: 2020-06-20 | Discharge: 2020-07-11 | DRG: 957 | Disposition: E | Payer: No Typology Code available for payment source | Attending: Student | Admitting: Student

## 2020-06-20 DIAGNOSIS — R402112 Coma scale, eyes open, never, at arrival to emergency department: Secondary | ICD-10-CM | POA: Diagnosis present

## 2020-06-20 DIAGNOSIS — R34 Anuria and oliguria: Secondary | ICD-10-CM | POA: Diagnosis not present

## 2020-06-20 DIAGNOSIS — E874 Mixed disorder of acid-base balance: Secondary | ICD-10-CM | POA: Diagnosis not present

## 2020-06-20 DIAGNOSIS — S32512A Fracture of superior rim of left pubis, initial encounter for closed fracture: Secondary | ICD-10-CM | POA: Diagnosis present

## 2020-06-20 DIAGNOSIS — J9602 Acute respiratory failure with hypercapnia: Secondary | ICD-10-CM | POA: Diagnosis present

## 2020-06-20 DIAGNOSIS — S064X0A Epidural hemorrhage without loss of consciousness, initial encounter: Secondary | ICD-10-CM | POA: Diagnosis present

## 2020-06-20 DIAGNOSIS — G9340 Encephalopathy, unspecified: Secondary | ICD-10-CM | POA: Diagnosis not present

## 2020-06-20 DIAGNOSIS — E1165 Type 2 diabetes mellitus with hyperglycemia: Secondary | ICD-10-CM | POA: Diagnosis present

## 2020-06-20 DIAGNOSIS — E785 Hyperlipidemia, unspecified: Secondary | ICD-10-CM | POA: Diagnosis present

## 2020-06-20 DIAGNOSIS — S329XXA Fracture of unspecified parts of lumbosacral spine and pelvis, initial encounter for closed fracture: Secondary | ICD-10-CM

## 2020-06-20 DIAGNOSIS — N183 Chronic kidney disease, stage 3 unspecified: Secondary | ICD-10-CM | POA: Diagnosis present

## 2020-06-20 DIAGNOSIS — Y92410 Unspecified street and highway as the place of occurrence of the external cause: Secondary | ICD-10-CM | POA: Diagnosis not present

## 2020-06-20 DIAGNOSIS — Z419 Encounter for procedure for purposes other than remedying health state, unspecified: Secondary | ICD-10-CM

## 2020-06-20 DIAGNOSIS — S12110A Anterior displaced Type II dens fracture, initial encounter for closed fracture: Secondary | ICD-10-CM | POA: Diagnosis present

## 2020-06-20 DIAGNOSIS — Z4682 Encounter for fitting and adjustment of non-vascular catheter: Secondary | ICD-10-CM

## 2020-06-20 DIAGNOSIS — Z96612 Presence of left artificial shoulder joint: Secondary | ICD-10-CM | POA: Diagnosis present

## 2020-06-20 DIAGNOSIS — S32129A Unspecified Zone II fracture of sacrum, initial encounter for closed fracture: Secondary | ICD-10-CM | POA: Diagnosis present

## 2020-06-20 DIAGNOSIS — Z66 Do not resuscitate: Secondary | ICD-10-CM | POA: Diagnosis present

## 2020-06-20 DIAGNOSIS — S37011A Minor contusion of right kidney, initial encounter: Secondary | ICD-10-CM | POA: Diagnosis present

## 2020-06-20 DIAGNOSIS — S12100A Unspecified displaced fracture of second cervical vertebra, initial encounter for closed fracture: Secondary | ICD-10-CM | POA: Diagnosis present

## 2020-06-20 DIAGNOSIS — S32601A Unspecified fracture of right ischium, initial encounter for closed fracture: Secondary | ICD-10-CM | POA: Diagnosis present

## 2020-06-20 DIAGNOSIS — R0902 Hypoxemia: Secondary | ICD-10-CM

## 2020-06-20 DIAGNOSIS — S24152A Other incomplete lesion at T2-T6 level of thoracic spinal cord, initial encounter: Secondary | ICD-10-CM | POA: Diagnosis present

## 2020-06-20 DIAGNOSIS — M810 Age-related osteoporosis without current pathological fracture: Secondary | ICD-10-CM | POA: Diagnosis present

## 2020-06-20 DIAGNOSIS — T1490XA Injury, unspecified, initial encounter: Secondary | ICD-10-CM | POA: Diagnosis present

## 2020-06-20 DIAGNOSIS — E669 Obesity, unspecified: Secondary | ICD-10-CM | POA: Diagnosis present

## 2020-06-20 DIAGNOSIS — D696 Thrombocytopenia, unspecified: Secondary | ICD-10-CM | POA: Diagnosis present

## 2020-06-20 DIAGNOSIS — R402222 Coma scale, best verbal response, incomprehensible words, at arrival to emergency department: Secondary | ICD-10-CM | POA: Diagnosis present

## 2020-06-20 DIAGNOSIS — S22059A Unspecified fracture of T5-T6 vertebra, initial encounter for closed fracture: Secondary | ICD-10-CM | POA: Diagnosis present

## 2020-06-20 DIAGNOSIS — N170 Acute kidney failure with tubular necrosis: Secondary | ICD-10-CM | POA: Diagnosis not present

## 2020-06-20 DIAGNOSIS — Z6841 Body Mass Index (BMI) 40.0 and over, adult: Secondary | ICD-10-CM

## 2020-06-20 DIAGNOSIS — D693 Immune thrombocytopenic purpura: Secondary | ICD-10-CM | POA: Diagnosis present

## 2020-06-20 DIAGNOSIS — R402352 Coma scale, best motor response, localizes pain, at arrival to emergency department: Secondary | ICD-10-CM | POA: Diagnosis present

## 2020-06-20 DIAGNOSIS — Z515 Encounter for palliative care: Secondary | ICD-10-CM

## 2020-06-20 DIAGNOSIS — S2243XA Multiple fractures of ribs, bilateral, initial encounter for closed fracture: Secondary | ICD-10-CM | POA: Diagnosis present

## 2020-06-20 DIAGNOSIS — S2242XA Multiple fractures of ribs, left side, initial encounter for closed fracture: Secondary | ICD-10-CM

## 2020-06-20 DIAGNOSIS — S272XXA Traumatic hemopneumothorax, initial encounter: Principal | ICD-10-CM | POA: Diagnosis present

## 2020-06-20 DIAGNOSIS — I129 Hypertensive chronic kidney disease with stage 1 through stage 4 chronic kidney disease, or unspecified chronic kidney disease: Secondary | ICD-10-CM | POA: Diagnosis present

## 2020-06-20 DIAGNOSIS — D62 Acute posthemorrhagic anemia: Secondary | ICD-10-CM | POA: Diagnosis present

## 2020-06-20 DIAGNOSIS — J9690 Respiratory failure, unspecified, unspecified whether with hypoxia or hypercapnia: Secondary | ICD-10-CM

## 2020-06-20 DIAGNOSIS — S24102A Unspecified injury at T2-T6 level of thoracic spinal cord, initial encounter: Secondary | ICD-10-CM | POA: Diagnosis present

## 2020-06-20 DIAGNOSIS — R319 Hematuria, unspecified: Secondary | ICD-10-CM | POA: Diagnosis not present

## 2020-06-20 DIAGNOSIS — N179 Acute kidney failure, unspecified: Secondary | ICD-10-CM

## 2020-06-20 DIAGNOSIS — Z20822 Contact with and (suspected) exposure to covid-19: Secondary | ICD-10-CM | POA: Diagnosis present

## 2020-06-20 DIAGNOSIS — Z8673 Personal history of transient ischemic attack (TIA), and cerebral infarction without residual deficits: Secondary | ICD-10-CM

## 2020-06-20 DIAGNOSIS — Z4901 Encounter for fitting and adjustment of extracorporeal dialysis catheter: Secondary | ICD-10-CM

## 2020-06-20 DIAGNOSIS — Z452 Encounter for adjustment and management of vascular access device: Secondary | ICD-10-CM

## 2020-06-20 DIAGNOSIS — S27321A Contusion of lung, unilateral, initial encounter: Secondary | ICD-10-CM | POA: Diagnosis present

## 2020-06-20 DIAGNOSIS — S35401A Unspecified injury of right renal artery, initial encounter: Secondary | ICD-10-CM

## 2020-06-20 DIAGNOSIS — I6340 Cerebral infarction due to embolism of unspecified cerebral artery: Secondary | ICD-10-CM | POA: Insufficient documentation

## 2020-06-20 DIAGNOSIS — E8770 Fluid overload, unspecified: Secondary | ICD-10-CM | POA: Diagnosis not present

## 2020-06-20 DIAGNOSIS — S35402A Unspecified injury of left renal artery, initial encounter: Secondary | ICD-10-CM | POA: Diagnosis present

## 2020-06-20 DIAGNOSIS — T796XXA Traumatic ischemia of muscle, initial encounter: Secondary | ICD-10-CM | POA: Diagnosis not present

## 2020-06-20 DIAGNOSIS — S32511A Fracture of superior rim of right pubis, initial encounter for closed fracture: Secondary | ICD-10-CM | POA: Diagnosis present

## 2020-06-20 DIAGNOSIS — J9601 Acute respiratory failure with hypoxia: Secondary | ICD-10-CM

## 2020-06-20 DIAGNOSIS — E1122 Type 2 diabetes mellitus with diabetic chronic kidney disease: Secondary | ICD-10-CM | POA: Diagnosis present

## 2020-06-20 DIAGNOSIS — J939 Pneumothorax, unspecified: Secondary | ICD-10-CM

## 2020-06-20 DIAGNOSIS — I63412 Cerebral infarction due to embolism of left middle cerebral artery: Secondary | ICD-10-CM | POA: Diagnosis not present

## 2020-06-20 DIAGNOSIS — T508X5A Adverse effect of diagnostic agents, initial encounter: Secondary | ICD-10-CM | POA: Diagnosis not present

## 2020-06-20 DIAGNOSIS — R4182 Altered mental status, unspecified: Secondary | ICD-10-CM

## 2020-06-20 DIAGNOSIS — Z9911 Dependence on respirator [ventilator] status: Secondary | ICD-10-CM

## 2020-06-20 DIAGNOSIS — R578 Other shock: Secondary | ICD-10-CM | POA: Diagnosis present

## 2020-06-20 LAB — I-STAT CHEM 8, ED
BUN: 26 mg/dL — ABNORMAL HIGH (ref 8–23)
Calcium, Ion: 1.08 mmol/L — ABNORMAL LOW (ref 1.15–1.40)
Chloride: 110 mmol/L (ref 98–111)
Creatinine, Ser: 1.7 mg/dL — ABNORMAL HIGH (ref 0.44–1.00)
Glucose, Bld: 244 mg/dL — ABNORMAL HIGH (ref 70–99)
HCT: 29 % — ABNORMAL LOW (ref 36.0–46.0)
Hemoglobin: 9.9 g/dL — ABNORMAL LOW (ref 12.0–15.0)
Potassium: 3.9 mmol/L (ref 3.5–5.1)
Sodium: 141 mmol/L (ref 135–145)
TCO2: 19 mmol/L — ABNORMAL LOW (ref 22–32)

## 2020-06-20 LAB — CBC
HCT: 33.7 % — ABNORMAL LOW (ref 36.0–46.0)
Hemoglobin: 10.2 g/dL — ABNORMAL LOW (ref 12.0–15.0)
MCH: 26.5 pg (ref 26.0–34.0)
MCHC: 30.3 g/dL (ref 30.0–36.0)
MCV: 87.5 fL (ref 80.0–100.0)
Platelets: 240 10*3/uL (ref 150–400)
RBC: 3.85 MIL/uL — ABNORMAL LOW (ref 3.87–5.11)
RDW: 14.7 % (ref 11.5–15.5)
WBC: 23.1 10*3/uL — ABNORMAL HIGH (ref 4.0–10.5)
nRBC: 0 % (ref 0.0–0.2)

## 2020-06-20 LAB — CBC WITH DIFFERENTIAL/PLATELET
Abs Immature Granulocytes: 0.11 10*3/uL — ABNORMAL HIGH (ref 0.00–0.07)
Basophils Absolute: 0 10*3/uL (ref 0.0–0.1)
Basophils Relative: 0 %
Eosinophils Absolute: 0 10*3/uL (ref 0.0–0.5)
Eosinophils Relative: 0 %
HCT: 38.7 % (ref 36.0–46.0)
Hemoglobin: 12.6 g/dL (ref 12.0–15.0)
Immature Granulocytes: 1 %
Lymphocytes Relative: 8 %
Lymphs Abs: 1.1 10*3/uL (ref 0.7–4.0)
MCH: 28.1 pg (ref 26.0–34.0)
MCHC: 32.6 g/dL (ref 30.0–36.0)
MCV: 86.4 fL (ref 80.0–100.0)
Monocytes Absolute: 0.6 10*3/uL (ref 0.1–1.0)
Monocytes Relative: 4 %
Neutro Abs: 12.4 10*3/uL — ABNORMAL HIGH (ref 1.7–7.7)
Neutrophils Relative %: 87 %
Platelets: 116 10*3/uL — ABNORMAL LOW (ref 150–400)
RBC: 4.48 MIL/uL (ref 3.87–5.11)
RDW: 15.4 % (ref 11.5–15.5)
WBC: 14.2 10*3/uL — ABNORMAL HIGH (ref 4.0–10.5)
nRBC: 0 % (ref 0.0–0.2)

## 2020-06-20 LAB — RESP PANEL BY RT-PCR (FLU A&B, COVID) ARPGX2
Influenza A by PCR: NEGATIVE
Influenza B by PCR: NEGATIVE
SARS Coronavirus 2 by RT PCR: NEGATIVE

## 2020-06-20 LAB — DIC (DISSEMINATED INTRAVASCULAR COAGULATION)PANEL
D-Dimer, Quant: >20 ug/mL-FEU — ABNORMAL HIGH (ref 0.00–0.50)
Fibrinogen: 342 mg/dL (ref 210–475)
INR: 1.2 (ref 0.8–1.2)
Platelets: 116 10*3/uL — ABNORMAL LOW (ref 150–400)
Prothrombin Time: 14.6 seconds (ref 11.4–15.2)
Smear Review: NONE SEEN
aPTT: 30 seconds (ref 24–36)

## 2020-06-20 LAB — HEMOGLOBIN AND HEMATOCRIT, BLOOD
HCT: 39.6 % (ref 36.0–46.0)
HCT: 40.1 % (ref 36.0–46.0)
Hemoglobin: 12.9 g/dL (ref 12.0–15.0)
Hemoglobin: 13.4 g/dL (ref 12.0–15.0)

## 2020-06-20 LAB — COMPREHENSIVE METABOLIC PANEL
ALT: 91 U/L — ABNORMAL HIGH (ref 0–44)
AST: 147 U/L — ABNORMAL HIGH (ref 15–41)
Albumin: 2 g/dL — ABNORMAL LOW (ref 3.5–5.0)
Alkaline Phosphatase: 75 U/L (ref 38–126)
Anion gap: 11 (ref 5–15)
BUN: 24 mg/dL — ABNORMAL HIGH (ref 8–23)
CO2: 18 mmol/L — ABNORMAL LOW (ref 22–32)
Calcium: 7.8 mg/dL — ABNORMAL LOW (ref 8.9–10.3)
Chloride: 110 mmol/L (ref 98–111)
Creatinine, Ser: 1.82 mg/dL — ABNORMAL HIGH (ref 0.44–1.00)
GFR, Estimated: 19 mL/min — ABNORMAL LOW (ref >60)
Glucose, Bld: 257 mg/dL — ABNORMAL HIGH (ref 70–99)
Potassium: 3.9 mmol/L (ref 3.5–5.1)
Sodium: 139 mmol/L (ref 135–145)
Total Bilirubin: 0.8 mg/dL (ref 0.3–1.2)
Total Protein: 4.7 g/dL — ABNORMAL LOW (ref 6.5–8.1)

## 2020-06-20 LAB — ETHANOL: Alcohol, Ethyl (B): <10 mg/dL (ref <10)

## 2020-06-20 LAB — CBG MONITORING, ED: Glucose-Capillary: 239 mg/dL — ABNORMAL HIGH (ref 70–99)

## 2020-06-20 LAB — URINALYSIS, MICROSCOPIC (REFLEX)
Bacteria, UA: NONE SEEN
RBC / HPF: >50 RBC/hpf (ref 0–5)

## 2020-06-20 LAB — BPAM PLATELET PHERESIS
Blood Product Expiration Date: 202204112359
ISSUE DATE / TIME: 202204101143
Unit Type and Rh: 6200

## 2020-06-20 LAB — URINALYSIS, ROUTINE W REFLEX MICROSCOPIC
Bilirubin Urine: NEGATIVE
Glucose, UA: 250 mg/dL — AB
Ketones, ur: NEGATIVE mg/dL
Nitrite: POSITIVE — AB
Protein, ur: >300 mg/dL — AB
Specific Gravity, Urine: 1.02 (ref 1.005–1.030)
pH: 6.5 (ref 5.0–8.0)

## 2020-06-20 LAB — I-STAT ARTERIAL BLOOD GAS, ED
Acid-base deficit: 7 mmol/L — ABNORMAL HIGH (ref 0.0–2.0)
Bicarbonate: 18.9 mmol/L — ABNORMAL LOW (ref 20.0–28.0)
Calcium, Ion: 0.97 mmol/L — ABNORMAL LOW (ref 1.15–1.40)
HCT: 34 % — ABNORMAL LOW (ref 36.0–46.0)
Hemoglobin: 11.6 g/dL — ABNORMAL LOW (ref 12.0–15.0)
O2 Saturation: 100 %
Patient temperature: 96.6
Potassium: 4.4 mmol/L (ref 3.5–5.1)
Sodium: 139 mmol/L (ref 135–145)
TCO2: 20 mmol/L — ABNORMAL LOW (ref 22–32)
pCO2 arterial: 38.5 mmHg (ref 32.0–48.0)
pH, Arterial: 7.295 — ABNORMAL LOW (ref 7.350–7.450)
pO2, Arterial: 222 mmHg — ABNORMAL HIGH (ref 83.0–108.0)

## 2020-06-20 LAB — TRAUMA TEG PANEL
CFF Max Amplitude: 20.9 mm (ref 15–32)
Citrated Kaolin (R): 5.9 min (ref 4.6–9.1)
Citrated Rapid TEG (MA): 61.2 mm (ref 52–70)
Lysis at 30 Minutes: 0 % (ref 0.0–2.6)

## 2020-06-20 LAB — PREPARE PLATELET PHERESIS: Unit division: 0

## 2020-06-20 LAB — ABO/RH: ABO/RH(D): O POS

## 2020-06-20 LAB — GLUCOSE, CAPILLARY: Glucose-Capillary: 196 mg/dL — ABNORMAL HIGH (ref 70–99)

## 2020-06-20 LAB — PROTIME-INR
INR: 1.2 (ref 0.8–1.2)
Prothrombin Time: 14.3 seconds (ref 11.4–15.2)

## 2020-06-20 LAB — MRSA PCR SCREENING: MRSA by PCR: NEGATIVE

## 2020-06-20 LAB — LACTIC ACID, PLASMA: Lactic Acid, Venous: 5.4 mmol/L (ref 0.5–1.9)

## 2020-06-20 LAB — MASSIVE TRANSFUSION PROTOCOL ORDER (BLOOD BANK NOTIFICATION)

## 2020-06-20 MED ORDER — PHENYLEPHRINE HCL-NACL 10-0.9 MG/250ML-% IV SOLN
0.0000 ug/min | INTRAVENOUS | Status: DC
  Administered 2020-06-20: 50 ug/min via INTRAVENOUS
  Administered 2020-06-20: 30 ug/min via INTRAVENOUS
  Filled 2020-06-20 (×2): qty 250

## 2020-06-20 MED ORDER — CHLORHEXIDINE GLUCONATE 0.12% ORAL RINSE (MEDLINE KIT)
15.0000 mL | Freq: Two times a day (BID) | OROMUCOSAL | Status: DC
  Administered 2020-06-20 – 2020-06-30 (×20): 15 mL via OROMUCOSAL

## 2020-06-20 MED ORDER — FENTANYL CITRATE (PF) 100 MCG/2ML IJ SOLN
INTRAMUSCULAR | Status: AC
  Filled 2020-06-20: qty 2

## 2020-06-20 MED ORDER — MIDAZOLAM HCL 2 MG/2ML IJ SOLN: 1.0000 mg | INTRAMUSCULAR | Status: DC | PRN

## 2020-06-20 MED ORDER — MIDAZOLAM BOLUS VIA INFUSION
1.0000 mg | INTRAVENOUS | Status: DC | PRN
  Administered 2020-06-22: 2 mg via INTRAVENOUS
  Filled 2020-06-20: qty 2

## 2020-06-20 MED ORDER — ONDANSETRON HCL 4 MG/2ML IJ SOLN: 4.0000 mg | Freq: Four times a day (QID) | INTRAMUSCULAR | Status: DC | PRN

## 2020-06-20 MED ORDER — IOHEXOL 300 MG/ML  SOLN
80.0000 mL | Freq: Once | INTRAMUSCULAR | Status: AC | PRN
  Administered 2020-06-20: 80 mL via INTRAVENOUS

## 2020-06-20 MED ORDER — FENTANYL CITRATE (PF) 100 MCG/2ML IJ SOLN: 50.0000 ug | INTRAMUSCULAR | Status: DC | PRN

## 2020-06-20 MED ORDER — FENTANYL CITRATE (PF) 100 MCG/2ML IJ SOLN
25.0000 ug | Freq: Once | INTRAMUSCULAR | Status: AC
  Administered 2020-06-20: 25 ug via INTRAVENOUS

## 2020-06-20 MED ORDER — MIDAZOLAM HCL 5 MG/5ML IJ SOLN
INTRAMUSCULAR | Status: AC | PRN
  Administered 2020-06-20: 1 mg via INTRAVENOUS

## 2020-06-20 MED ORDER — ETOMIDATE 2 MG/ML IV SOLN
INTRAVENOUS | Status: AC | PRN
  Administered 2020-06-20: 15 mg via INTRAVENOUS

## 2020-06-20 MED ORDER — ONDANSETRON 4 MG PO TBDP: 4.0000 mg | ORAL_TABLET | Freq: Four times a day (QID) | ORAL | Status: DC | PRN

## 2020-06-20 MED ORDER — ORAL CARE MOUTH RINSE
15.0000 mL | OROMUCOSAL | Status: DC
  Administered 2020-06-20 – 2020-06-30 (×91): 15 mL via OROMUCOSAL

## 2020-06-20 MED ORDER — SUCCINYLCHOLINE CHLORIDE 20 MG/ML IJ SOLN
INTRAMUSCULAR | Status: AC | PRN
  Administered 2020-06-20: 100 mg via INTRAVENOUS

## 2020-06-20 MED ORDER — FENTANYL CITRATE (PF) 100 MCG/2ML IJ SOLN
INTRAMUSCULAR | Status: AC | PRN
  Administered 2020-06-20: 50 ug via INTRAVENOUS

## 2020-06-20 MED ORDER — FENTANYL CITRATE (PF) 100 MCG/2ML IJ SOLN
50.0000 ug | INTRAMUSCULAR | Status: DC | PRN
Start: 2020-06-20 — End: 2020-06-20
  Administered 2020-06-20: 50 ug via INTRAVENOUS

## 2020-06-20 MED ORDER — MIDAZOLAM 50MG/50ML (1MG/ML) PREMIX INFUSION
0.5000 mg/h | INTRAVENOUS | Status: AC
Start: 2020-06-20 — End: 2020-06-23
  Administered 2020-06-20: 0.5 mg/h via INTRAVENOUS
  Administered 2020-06-21: 2 mg/h via INTRAVENOUS
  Administered 2020-06-21: 1 mg/h via INTRAVENOUS
  Filled 2020-06-20 (×5): qty 50

## 2020-06-20 MED ORDER — IOHEXOL 350 MG/ML SOLN
50.0000 mL | Freq: Once | INTRAVENOUS | Status: AC | PRN
  Administered 2020-06-20: 50 mL via INTRAVENOUS

## 2020-06-20 MED ORDER — MIDAZOLAM 50MG/50ML (1MG/ML) PREMIX INFUSION
0.0000 mg/h | INTRAVENOUS | Status: DC
  Filled 2020-06-20: qty 50

## 2020-06-20 MED ORDER — DEXTROSE-NACL 5-0.9 % IV SOLN: INTRAVENOUS | Status: DC

## 2020-06-20 MED ORDER — FENTANYL 2500MCG IN NS 250ML (10MCG/ML) PREMIX INFUSION
0.0000 ug/h | INTRAVENOUS | Status: DC
  Administered 2020-06-20: 25 ug/h via INTRAVENOUS
  Administered 2020-06-21: 175 ug/h via INTRAVENOUS
  Administered 2020-06-21 – 2020-06-22 (×2): 150 ug/h via INTRAVENOUS
  Administered 2020-06-23: 100 ug/h via INTRAVENOUS
  Administered 2020-06-23: 200 ug/h via INTRAVENOUS
  Administered 2020-06-25 – 2020-06-27 (×2): 50 ug/h via INTRAVENOUS
  Filled 2020-06-20 (×7): qty 250

## 2020-06-20 MED ORDER — PHENYLEPHRINE HCL-NACL 10-0.9 MG/250ML-% IV SOLN
INTRAVENOUS | Status: AC
  Filled 2020-06-20: qty 250

## 2020-06-20 MED ORDER — FENTANYL BOLUS VIA INFUSION
25.0000 ug | INTRAVENOUS | Status: DC | PRN
  Administered 2020-06-22 – 2020-06-28 (×3): 25 ug via INTRAVENOUS
  Filled 2020-06-20: qty 25

## 2020-06-20 MED ORDER — MIDAZOLAM HCL 2 MG/2ML IJ SOLN
INTRAMUSCULAR | Status: AC
  Filled 2020-06-20: qty 2

## 2020-06-20 MED ORDER — FENTANYL 2500MCG IN NS 250ML (10MCG/ML) PREMIX INFUSION
25.0000 ug/h | INTRAVENOUS | Status: DC
  Filled 2020-06-20: qty 250

## 2020-06-20 MED ORDER — CHLORHEXIDINE GLUCONATE CLOTH 2 % EX PADS
6.0000 | MEDICATED_PAD | Freq: Every day | CUTANEOUS | Status: DC
  Administered 2020-06-21 – 2020-06-29 (×9): 6 via TOPICAL

## 2020-06-20 NOTE — ED Provider Notes (Signed)
I was passing by the room and was called to the patient's bedside with the blood pressure in the 30s systolic.  Intact pulses difficult IV access I attempted to place right femoral central line.  Unsuccessful.  During that time the patient's blood pressure has improved.  Repeat ultrasound with large hematoma will hold off on placement.  .Central Line  Date/Time: 07/03/2020 12:17 PM Performed by: Melene Plan, DO Authorized by: Melene Plan, DO   Consent:    Consent obtained:  Emergent situation Universal protocol:    Patient identity confirmed:  Arm band Pre-procedure details:    Indication(s): central venous access     Hand hygiene: Hand hygiene performed prior to insertion     Sterile barrier technique: All elements of maximal sterile technique followed     Skin preparation:  Chlorhexidine Sedation:    Sedation type:  None Anesthesia:    Anesthesia method:  None Procedure details:    Location:  R femoral   Patient position:  Supine   Procedural supplies:  Cordis   Catheter size:  7 Fr   Landmarks identified: yes     Ultrasound guidance: yes     Ultrasound guidance timing: real time     Sterile ultrasound techniques: Sterile gel and sterile probe covers were used     Number of attempts:  1   Successful placement: no   Post-procedure details:    Post-procedure:  Dressing applied   Procedure completion:  Procedure terminated electively by provider      Melene Plan, DO 07/09/2020 1218

## 2020-06-20 NOTE — Progress Notes (Signed)
Pt transported on the ventilator to CT and back to her room 4N31 without any complications.

## 2020-06-20 NOTE — Progress Notes (Signed)
Patient to 4N31 at 1718, responsive to voice on arrival. No personal belongings with patient but some valuables with security. Patient with unstable thoracic spine fractures and spinal precautions ordered. Spoke with Dr. Derrell Lolling regarding possible foley placement, MD okay with foley placement as long as staff can place without pelvic movement d/t fractures. 3 RNs placed foley without manipulating patient's legs, caution was taken to maintain sterility. Family in waiting area.  Aris Lot, RN
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
## 2020-06-20 NOTE — Progress Notes (Signed)
After reassessing the pt after intubation she did follow commands in Spanish and moved all 4 ext.

## 2020-06-20 NOTE — ED Notes (Signed)
To ct

## 2020-06-20 NOTE — Progress Notes (Signed)
Spoke with pts RN Abby at 11pm after doing pts CT Angio neck.  Pts GFR was 31 and although I gave reduced dose, I wanted her to make sure pt was being hydrated for creatinine since she also had contrast earlier. Told her I just wanted RN and MD aware also.
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
## 2020-06-20 NOTE — Progress Notes (Signed)
   07/09/2020 1020  Clinical Encounter Type  Visited With Patient not available  Visit Type Trauma  Referral From Nurse  Consult/Referral To Chaplain   Chaplain responded to Level 1 page. The patient is being treated and there is no family present at this time. Chaplain remains available if needed. This note was prepared by Deneen Harts, M.Div..  For questions please contact by phone [phone].

## 2020-06-20 NOTE — ED Notes (Signed)
CT delayed d/t impending intubation.

## 2020-06-20 NOTE — ED Notes (Signed)
Pt's money is locked in security.

## 2020-06-20 NOTE — Procedures (Signed)
Central line  Date/Time: 14-Jul-2020 1:28 PM Performed by: Axel Filler, MD Authorized by: Axel Filler, MD   Consent:    Consent obtained:  Emergent situation   Risks, benefits, and alternatives were discussed: not applicable   Pre-procedure details:    Indication(s): insufficient peripheral access     Hand hygiene: Hand hygiene performed prior to insertion     Sterile barrier technique: All elements of maximal sterile technique followed     Skin preparation:  Chlorhexidine   Skin preparation agent: Skin preparation agent completely dried prior to procedure   Sedation:    Sedation type:  None Anesthesia:    Anesthesia method:  Local infiltration Procedure details:    Location:  R subclavian   Patient position:  Supine   Procedural supplies:  Triple lumen   Catheter size:  7 Fr   Number of attempts:  1   Successful placement: yes   Post-procedure details:    Post-procedure:  Dressing applied and line sutured   Assessment:  Blood return through all ports   Procedure completion:  Tolerated Comments:     CXR pending

## 2020-06-20 NOTE — ED Notes (Signed)
Per Dr. Maisie Fus- Neurosurgery-- pt on log roll precautions-
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
## 2020-06-20 NOTE — Progress Notes (Signed)
   06/28/2020 1500  Clinical Encounter Type  Visited With Family  Visit Type Other (Comment) (Escort family)  Referral From Nurse  Consult/Referral To Chaplain  Chaplain responded to page from nurse, Clydie Braun. Clydie Braun requested for chaplain to escort the patient's family from Consultation Rm A to the ER waiting area. I asked the family if they needed anything and offered prayer.This note was prepared by Deneen Harts, M.Div..  For questions please contact by phone [phone].

## 2020-06-20 NOTE — TOC CAGE-AID Note (Signed)
Transition of Care [facility]) - CAGE-AID Screening   Patient Details  Name: Meghan Ford MRN: [id number] Date of Birth: 14-Oct-1940   Hewitt Shorts, RN Trauma Response Nurse Phone Number: 06/18/2020, 2:38 PM   Clinical Narrative:  Pt intubated unable to answer questions    CAGE-AID Screening: Substance Abuse Screening unable to be completed due to: : Patient unable to participate (pt is intubated/sedated)  Have You Ever Felt You Ought to Cut Down on Your Drinking or Drug Use?: No Have People Annoyed You By Critizing Your Drinking Or Drug Use?: No Have You Felt Bad Or Guilty About Your Drinking Or Drug Use?: No Have You Ever Had a Drink or Used Drugs First Thing In The Morning to Steady Your Nerves or to Get Rid of a Hangover?: No CAGE-AID Score: 0

## 2020-06-21 ENCOUNTER — Other Ambulatory Visit: Payer: Self-pay | Admitting: Neurosurgery

## 2020-06-21 ENCOUNTER — Inpatient Hospital Stay (HOSPITAL_COMMUNITY): Payer: No Typology Code available for payment source

## 2020-06-21 LAB — BLOOD PRODUCT ORDER (VERBAL) VERIFICATION

## 2020-06-21 LAB — BPAM FFP
Blood Product Expiration Date: 202204112359
Blood Product Expiration Date: 202204152359
Blood Product Expiration Date: 202204152359
Blood Product Expiration Date: 202204152359
Blood Product Expiration Date: 202204272359
Blood Product Expiration Date: 202204282359
Blood Product Expiration Date: 202204282359
Blood Product Expiration Date: 202204292359
Blood Product Expiration Date: 202204302359
Blood Product Expiration Date: 202204302359
ISSUE DATE / TIME: 202204101127
ISSUE DATE / TIME: 202204101127
ISSUE DATE / TIME: 202204101127
ISSUE DATE / TIME: 202204101127
ISSUE DATE / TIME: 202204101131
ISSUE DATE / TIME: 202204101131
ISSUE DATE / TIME: 202204101131
ISSUE DATE / TIME: 202204101131
ISSUE DATE / TIME: 202204101146
ISSUE DATE / TIME: 202204101146
Unit Type and Rh: 6200
Unit Type and Rh: 6200
Unit Type and Rh: 6200
Unit Type and Rh: 6200
Unit Type and Rh: 6200
Unit Type and Rh: 6200
Unit Type and Rh: 6200
Unit Type and Rh: 6200
Unit Type and Rh: 6200
Unit Type and Rh: 6200

## 2020-06-21 LAB — PREPARE FRESH FROZEN PLASMA
Unit division: 0
Unit division: 0
Unit division: 0
Unit division: 0
Unit division: 0
Unit division: 0
Unit division: 0

## 2020-06-21 LAB — CBC
HCT: 34.3 % — ABNORMAL LOW (ref 36.0–46.0)
Hemoglobin: 11.4 g/dL — ABNORMAL LOW (ref 12.0–15.0)
MCH: 28.4 pg (ref 26.0–34.0)
MCHC: 33.2 g/dL (ref 30.0–36.0)
MCV: 85.5 fL (ref 80.0–100.0)
Platelets: 85 10*3/uL — ABNORMAL LOW (ref 150–400)
RBC: 4.01 MIL/uL (ref 3.87–5.11)
RDW: 15.9 % — ABNORMAL HIGH (ref 11.5–15.5)
WBC: 10.2 10*3/uL (ref 4.0–10.5)
nRBC: 0 % (ref 0.0–0.2)

## 2020-06-21 LAB — GLUCOSE, CAPILLARY
Glucose-Capillary: 201 mg/dL — ABNORMAL HIGH (ref 70–99)
Glucose-Capillary: 199 mg/dL — ABNORMAL HIGH (ref 70–99)
Glucose-Capillary: 113 mg/dL — ABNORMAL HIGH (ref 70–99)
Glucose-Capillary: 79 mg/dL (ref 70–99)

## 2020-06-21 LAB — HEMOGLOBIN AND HEMATOCRIT, BLOOD
HCT: 30.7 % — ABNORMAL LOW (ref 36.0–46.0)
HCT: 31.1 % — ABNORMAL LOW (ref 36.0–46.0)
HCT: 35.3 % — ABNORMAL LOW (ref 36.0–46.0)
Hemoglobin: 10 g/dL — ABNORMAL LOW (ref 12.0–15.0)
Hemoglobin: 10.5 g/dL — ABNORMAL LOW (ref 12.0–15.0)
Hemoglobin: 11.8 g/dL — ABNORMAL LOW (ref 12.0–15.0)

## 2020-06-21 LAB — HEMOGLOBIN A1C
Hgb A1c MFr Bld: 6.2 % — ABNORMAL HIGH (ref 4.8–5.6)
Mean Plasma Glucose: 131.24 mg/dL

## 2020-06-21 LAB — BASIC METABOLIC PANEL
Anion gap: 4 — ABNORMAL LOW (ref 5–15)
BUN: 32 mg/dL — ABNORMAL HIGH (ref 8–23)
CO2: 18 mmol/L — ABNORMAL LOW (ref 22–32)
Calcium: 6.7 mg/dL — ABNORMAL LOW (ref 8.9–10.3)
Chloride: 115 mmol/L — ABNORMAL HIGH (ref 98–111)
Creatinine, Ser: 2.36 mg/dL — ABNORMAL HIGH (ref 0.44–1.00)
GFR, Estimated: 21 mL/min — ABNORMAL LOW (ref >60)
Glucose, Bld: 213 mg/dL — ABNORMAL HIGH (ref 70–99)
Potassium: 4.5 mmol/L (ref 3.5–5.1)
Sodium: 137 mmol/L (ref 135–145)

## 2020-06-21 LAB — PHOSPHORUS
Phosphorus: 4.6 mg/dL (ref 2.5–4.6)
Phosphorus: 5.2 mg/dL — ABNORMAL HIGH (ref 2.5–4.6)

## 2020-06-21 LAB — MAGNESIUM
Magnesium: 1.7 mg/dL (ref 1.7–2.4)
Magnesium: 1.7 mg/dL (ref 1.7–2.4)

## 2020-06-21 MED ORDER — SODIUM CHLORIDE 0.9 % IV SOLN: INTRAVENOUS | Status: DC

## 2020-06-21 MED ORDER — VITAL HIGH PROTEIN PO LIQD: 1000.0000 mL | ORAL | Status: DC

## 2020-06-21 MED ORDER — CALCIUM GLUCONATE-NACL 1-0.675 GM/50ML-% IV SOLN
1.0000 g | Freq: Once | INTRAVENOUS | Status: AC
  Administered 2020-06-21: 1000 mg via INTRAVENOUS
  Filled 2020-06-21: qty 50

## 2020-06-21 MED ORDER — GADOBUTROL 1 MMOL/ML IV SOLN
10.0000 mL | Freq: Once | INTRAVENOUS | Status: AC | PRN
  Administered 2020-06-21: 10 mL via INTRAVENOUS

## 2020-06-21 MED ORDER — ALBUMIN HUMAN 5 % IV SOLN
INTRAVENOUS | Status: AC
  Administered 2020-06-21: 12.5 g
  Filled 2020-06-21: qty 250

## 2020-06-21 MED ORDER — SODIUM CHLORIDE 0.9 % IV BOLUS
1000.0000 mL | Freq: Once | INTRAVENOUS | Status: AC
  Administered 2020-06-21: 1000 mL via INTRAVENOUS

## 2020-06-21 MED ORDER — ALBUMIN HUMAN 5 % IV SOLN
25.0000 g | Freq: Once | INTRAVENOUS | Status: AC
  Administered 2020-06-21: 25 g via INTRAVENOUS
  Filled 2020-06-21: qty 500

## 2020-06-21 MED ORDER — PIVOT 1.5 CAL PO LIQD
1000.0000 mL | ORAL | Status: DC
  Administered 2020-06-21 – 2020-06-24 (×2): 1000 mL

## 2020-06-21 MED ORDER — ALBUMIN HUMAN 5 % IV SOLN
25.0000 g | Freq: Once | INTRAVENOUS | Status: AC
  Administered 2020-06-21: 12.5 g via INTRAVENOUS
  Filled 2020-06-21: qty 500

## 2020-06-21 MED ORDER — PROSOURCE TF PO LIQD
45.0000 mL | Freq: Two times a day (BID) | ORAL | Status: DC
  Administered 2020-06-21 – 2020-06-30 (×17): 45 mL
  Filled 2020-06-21 (×16): qty 45

## 2020-06-21 MED ORDER — PANTOPRAZOLE SODIUM 40 MG IV SOLR
40.0000 mg | INTRAVENOUS | Status: DC
  Administered 2020-06-21: 40 mg via INTRAVENOUS
  Filled 2020-06-21: qty 40

## 2020-06-21 MED ORDER — INSULIN ASPART 100 UNIT/ML ~~LOC~~ SOLN
0.0000 [IU] | SUBCUTANEOUS | Status: DC
  Administered 2020-06-21: 5 [IU] via SUBCUTANEOUS
  Administered 2020-06-21: 3 [IU] via SUBCUTANEOUS
  Administered 2020-06-22 (×2): 2 [IU] via SUBCUTANEOUS
  Administered 2020-06-22 (×2): 3 [IU] via SUBCUTANEOUS
  Administered 2020-06-23 (×4): 2 [IU] via SUBCUTANEOUS
  Administered 2020-06-24: 3 [IU] via SUBCUTANEOUS
  Administered 2020-06-24 (×2): 5 [IU] via SUBCUTANEOUS
  Administered 2020-06-24: 3 [IU] via SUBCUTANEOUS
  Administered 2020-06-24 (×2): 2 [IU] via SUBCUTANEOUS
  Administered 2020-06-25 (×6): 5 [IU] via SUBCUTANEOUS
  Administered 2020-06-26: 8 [IU] via SUBCUTANEOUS
  Administered 2020-06-26 (×3): 5 [IU] via SUBCUTANEOUS
  Administered 2020-06-26: 8 [IU] via SUBCUTANEOUS
  Administered 2020-06-26: 3 [IU] via SUBCUTANEOUS
  Administered 2020-06-26 – 2020-06-27 (×4): 8 [IU] via SUBCUTANEOUS
  Administered 2020-06-27 (×2): 5 [IU] via SUBCUTANEOUS
  Administered 2020-06-27 – 2020-06-28 (×2): 8 [IU] via SUBCUTANEOUS
  Administered 2020-06-28: 2 [IU] via SUBCUTANEOUS
  Administered 2020-06-28: 5 [IU] via SUBCUTANEOUS
  Administered 2020-06-28: 8 [IU] via SUBCUTANEOUS
  Administered 2020-06-28: 5 [IU] via SUBCUTANEOUS
  Administered 2020-06-29 (×3): 8 [IU] via SUBCUTANEOUS
  Administered 2020-06-29 (×2): 5 [IU] via SUBCUTANEOUS
  Administered 2020-06-30: 8 [IU] via SUBCUTANEOUS
  Administered 2020-06-30: 5 [IU] via SUBCUTANEOUS
  Administered 2020-06-30 (×2): 2 [IU] via SUBCUTANEOUS
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
## 2020-06-21 NOTE — Progress Notes (Signed)
Pt urine output has been minimal the past two hours (<30 ml). Orders to draw BMP/CBC and give 1 L bolus.

## 2020-06-21 NOTE — Progress Notes (Signed)
Pt urine output significantly low for shift ( ) and extremely concentrated, bladder scanned and no urine in bladder. Trauma MD notified and V/O for 25g Albumin and CVP monitoring.  Aris Lot, RN
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
## 2020-06-21 NOTE — Progress Notes (Signed)
Attempted to get pt to mri per RN pt  Unstable for mri at this time. Will try again later
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
## 2020-06-22 ENCOUNTER — Inpatient Hospital Stay (HOSPITAL_COMMUNITY): Payer: No Typology Code available for payment source

## 2020-06-22 LAB — BASIC METABOLIC PANEL
Anion gap: 12 (ref 5–15)
BUN: 35 mg/dL — ABNORMAL HIGH (ref 8–23)
CO2: 17 mmol/L — ABNORMAL LOW (ref 22–32)
Calcium: 7.1 mg/dL — ABNORMAL LOW (ref 8.9–10.3)
Chloride: 112 mmol/L — ABNORMAL HIGH (ref 98–111)
Creatinine, Ser: 2.78 mg/dL — ABNORMAL HIGH (ref 0.44–1.00)
GFR, Estimated: 17 mL/min — ABNORMAL LOW (ref >60)
Glucose, Bld: 165 mg/dL — ABNORMAL HIGH (ref 70–99)
Potassium: 4.5 mmol/L (ref 3.5–5.1)
Sodium: 141 mmol/L (ref 135–145)

## 2020-06-22 LAB — HEMOGLOBIN AND HEMATOCRIT, BLOOD
HCT: 27.2 % — ABNORMAL LOW (ref 36.0–46.0)
HCT: 27.8 % — ABNORMAL LOW (ref 36.0–46.0)
HCT: 27.9 % — ABNORMAL LOW (ref 36.0–46.0)
HCT: 29.3 % — ABNORMAL LOW (ref 36.0–46.0)
Hemoglobin: 8.7 g/dL — ABNORMAL LOW (ref 12.0–15.0)
Hemoglobin: 8.8 g/dL — ABNORMAL LOW (ref 12.0–15.0)
Hemoglobin: 8.9 g/dL — ABNORMAL LOW (ref 12.0–15.0)
Hemoglobin: 9.5 g/dL — ABNORMAL LOW (ref 12.0–15.0)

## 2020-06-22 LAB — CBC
HCT: 35.6 % — ABNORMAL LOW (ref 36.0–46.0)
Hemoglobin: 11.2 g/dL — ABNORMAL LOW (ref 12.0–15.0)
MCH: 28.3 pg (ref 26.0–34.0)
MCHC: 31.5 g/dL (ref 30.0–36.0)
MCV: 89.9 fL (ref 80.0–100.0)
Platelets: 80 10*3/uL — ABNORMAL LOW (ref 150–400)
RBC: 3.96 MIL/uL (ref 3.87–5.11)
RDW: 16.6 % — ABNORMAL HIGH (ref 11.5–15.5)
WBC: 10.9 10*3/uL — ABNORMAL HIGH (ref 4.0–10.5)
nRBC: 0 % (ref 0.0–0.2)

## 2020-06-22 LAB — GLUCOSE, CAPILLARY
Glucose-Capillary: 100 mg/dL — ABNORMAL HIGH (ref 70–99)
Glucose-Capillary: 101 mg/dL — ABNORMAL HIGH (ref 70–99)
Glucose-Capillary: 146 mg/dL — ABNORMAL HIGH (ref 70–99)
Glucose-Capillary: 156 mg/dL — ABNORMAL HIGH (ref 70–99)
Glucose-Capillary: 161 mg/dL — ABNORMAL HIGH (ref 70–99)
Glucose-Capillary: 190 mg/dL — ABNORMAL HIGH (ref 70–99)

## 2020-06-22 LAB — PHOSPHORUS
Phosphorus: 4.9 mg/dL — ABNORMAL HIGH (ref 2.5–4.6)
Phosphorus: 5 mg/dL — ABNORMAL HIGH (ref 2.5–4.6)

## 2020-06-22 LAB — MAGNESIUM
Magnesium: 1.8 mg/dL (ref 1.7–2.4)
Magnesium: 1.8 mg/dL (ref 1.7–2.4)

## 2020-06-22 MED ORDER — PANTOPRAZOLE SODIUM 40 MG PO PACK
40.0000 mg | PACK | Freq: Every day | ORAL | Status: DC
  Administered 2020-06-22 – 2020-06-30 (×7): 40 mg
  Filled 2020-06-22 (×7): qty 20

## 2020-06-22 MED ORDER — SODIUM CHLORIDE 0.9% IV SOLUTION: Freq: Once | INTRAVENOUS | Status: DC

## 2020-06-22 NOTE — Anesthesia Preprocedure Evaluation (Addendum)
Anesthesia Evaluation  Patient identified by MRN, date of birth, ID band Patient unresponsive    Reviewed: Allergy & Precautions, NPO status , Patient's Chart, lab work & pertinent test results, Unable to perform ROS - Chart review only  Airway Mallampati: Intubated       Dental   C collar, intubated:   Pulmonary neg pulmonary ROS,    + rhonchi    + intubated    Cardiovascular negative cardio ROS   Rhythm:Regular Rate:Normal     Neuro/Psych negative neurological ROS     GI/Hepatic negative GI ROS, Neg liver ROS,   Endo/Other    Renal/GU negative Renal ROS     Musculoskeletal negative musculoskeletal ROS (+)   Abdominal (+) + obese,   Peds  Hematology negative hematology ROS (+)   Anesthesia Other Findings   Reproductive/Obstetrics                          Anesthesia Physical Anesthesia Plan  ASA: IV  Anesthesia Plan: General   Post-op Pain Management:    Induction: Intravenous  PONV Risk Score and Plan: 4 or greater and Ondansetron and Treatment may vary due to age or medical condition  Airway Management Planned: Oral ETT  Additional Equipment: Arterial line and CVP  Intra-op Plan:   Post-operative Plan: Post-operative intubation/ventilation  Informed Consent: I have reviewed the patients History and Physical, chart, labs and discussed the procedure including the risks, benefits and alternatives for the proposed anesthesia with the patient or authorized representative who has indicated his/her understanding and acceptance.     History available from chart only  Plan Discussed with: CRNA  Anesthesia Plan Comments: (2 x 18g or larger IV)    Anesthesia Quick Evaluation
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
## 2020-06-23 ENCOUNTER — Inpatient Hospital Stay (HOSPITAL_COMMUNITY): Payer: No Typology Code available for payment source

## 2020-06-23 ENCOUNTER — Inpatient Hospital Stay (HOSPITAL_COMMUNITY): Payer: No Typology Code available for payment source | Admitting: Certified Registered Nurse Anesthetist

## 2020-06-23 ENCOUNTER — Inpatient Hospital Stay (HOSPITAL_COMMUNITY): Admission: EM | Disposition: E | Payer: Self-pay | Source: Home / Self Care

## 2020-06-23 HISTORY — PX: APPLICATION OF INTRAOPERATIVE CT SCAN: SHX6668

## 2020-06-23 HISTORY — PX: LUMBAR PERCUTANEOUS PEDICLE SCREW 4 LEVEL: SHX6318

## 2020-06-23 LAB — POCT I-STAT 7, (LYTES, BLD GAS, ICA,H+H)
Acid-base deficit: 10 mmol/L — ABNORMAL HIGH (ref 0.0–2.0)
Acid-base deficit: 13 mmol/L — ABNORMAL HIGH (ref 0.0–2.0)
Acid-base deficit: 13 mmol/L — ABNORMAL HIGH (ref 0.0–2.0)
Acid-base deficit: 11 mmol/L — ABNORMAL HIGH (ref 0.0–2.0)
Acid-base deficit: 7 mmol/L — ABNORMAL HIGH (ref 0.0–2.0)
Bicarbonate: 16.5 mmol/L — ABNORMAL LOW (ref 20.0–28.0)
Bicarbonate: 15.6 mmol/L — ABNORMAL LOW (ref 20.0–28.0)
Bicarbonate: 16.9 mmol/L — ABNORMAL LOW (ref 20.0–28.0)
Bicarbonate: 18.1 mmol/L — ABNORMAL LOW (ref 20.0–28.0)
Bicarbonate: 21.7 mmol/L (ref 20.0–28.0)
Calcium, Ion: 1.18 mmol/L (ref 1.15–1.40)
Calcium, Ion: 1.1 mmol/L — ABNORMAL LOW (ref 1.15–1.40)
Calcium, Ion: 1.08 mmol/L — ABNORMAL LOW (ref 1.15–1.40)
Calcium, Ion: 1.12 mmol/L — ABNORMAL LOW (ref 1.15–1.40)
Calcium, Ion: 1.07 mmol/L — ABNORMAL LOW (ref 1.15–1.40)
HCT: 24 % — ABNORMAL LOW (ref 36.0–46.0)
HCT: 26 % — ABNORMAL LOW (ref 36.0–46.0)
HCT: 25 % — ABNORMAL LOW (ref 36.0–46.0)
HCT: 30 % — ABNORMAL LOW (ref 36.0–46.0)
HCT: 28 % — ABNORMAL LOW (ref 36.0–46.0)
Hemoglobin: 8.2 g/dL — ABNORMAL LOW (ref 12.0–15.0)
Hemoglobin: 8.8 g/dL — ABNORMAL LOW (ref 12.0–15.0)
Hemoglobin: 8.5 g/dL — ABNORMAL LOW (ref 12.0–15.0)
Hemoglobin: 10.2 g/dL — ABNORMAL LOW (ref 12.0–15.0)
Hemoglobin: 9.5 g/dL — ABNORMAL LOW (ref 12.0–15.0)
O2 Saturation: 87 %
O2 Saturation: 92 %
O2 Saturation: 90 %
O2 Saturation: 93 %
O2 Saturation: 90 %
Patient temperature: 96.4
Patient temperature: 36.3
Patient temperature: 35.6
Patient temperature: 35.5
Potassium: 4.3 mmol/L (ref 3.5–5.1)
Potassium: 4.6 mmol/L (ref 3.5–5.1)
Potassium: 4.9 mmol/L (ref 3.5–5.1)
Potassium: 5.2 mmol/L — ABNORMAL HIGH (ref 3.5–5.1)
Potassium: 4.8 mmol/L (ref 3.5–5.1)
Sodium: 144 mmol/L (ref 135–145)
Sodium: 145 mmol/L (ref 135–145)
Sodium: 145 mmol/L (ref 135–145)
Sodium: 146 mmol/L — ABNORMAL HIGH (ref 135–145)
Sodium: 148 mmol/L — ABNORMAL HIGH (ref 135–145)
TCO2: 18 mmol/L — ABNORMAL LOW (ref 22–32)
TCO2: 17 mmol/L — ABNORMAL LOW (ref 22–32)
TCO2: 19 mmol/L — ABNORMAL LOW (ref 22–32)
TCO2: 20 mmol/L — ABNORMAL LOW (ref 22–32)
TCO2: 23 mmol/L (ref 22–32)
pCO2 arterial: 37.9 mmHg (ref 32.0–48.0)
pCO2 arterial: 46.8 mmHg (ref 32.0–48.0)
pCO2 arterial: 58.9 mmHg — ABNORMAL HIGH (ref 32.0–48.0)
pCO2 arterial: 55.9 mmHg — ABNORMAL HIGH (ref 32.0–48.0)
pCO2 arterial: 55.4 mmHg — ABNORMAL HIGH (ref 32.0–48.0)
pH, Arterial: 7.24 — ABNORMAL LOW (ref 7.350–7.450)
pH, Arterial: 7.125 — CL (ref 7.350–7.450)
pH, Arterial: 7.057 — CL (ref 7.350–7.450)
pH, Arterial: 7.119 — CL (ref 7.350–7.450)
pH, Arterial: 7.193 — CL (ref 7.350–7.450)
pO2, Arterial: 58 mmHg — ABNORMAL LOW (ref 83.0–108.0)
pO2, Arterial: 81 mmHg — ABNORMAL LOW (ref 83.0–108.0)
pO2, Arterial: 78 mmHg — ABNORMAL LOW (ref 83.0–108.0)
pO2, Arterial: 88 mmHg (ref 83.0–108.0)
pO2, Arterial: 67 mmHg — ABNORMAL LOW (ref 83.0–108.0)

## 2020-06-23 LAB — CBC
HCT: 28.5 % — ABNORMAL LOW (ref 36.0–46.0)
Hemoglobin: 8.9 g/dL — ABNORMAL LOW (ref 12.0–15.0)
MCH: 28.2 pg (ref 26.0–34.0)
MCHC: 31.2 g/dL (ref 30.0–36.0)
MCV: 90.2 fL (ref 80.0–100.0)
Platelets: 66 10*3/uL — ABNORMAL LOW (ref 150–400)
RBC: 3.16 MIL/uL — ABNORMAL LOW (ref 3.87–5.11)
RDW: 17.6 % — ABNORMAL HIGH (ref 11.5–15.5)
WBC: 5 10*3/uL (ref 4.0–10.5)
nRBC: 1 % — ABNORMAL HIGH (ref 0.0–0.2)

## 2020-06-23 LAB — PREPARE PLATELET PHERESIS: Unit division: 0

## 2020-06-23 LAB — PREPARE RBC (CROSSMATCH)

## 2020-06-23 LAB — BASIC METABOLIC PANEL
Anion gap: 6 (ref 5–15)
Anion gap: 9 (ref 5–15)
BUN: 55 mg/dL — ABNORMAL HIGH (ref 8–23)
BUN: 58 mg/dL — ABNORMAL HIGH (ref 8–23)
CO2: 17 mmol/L — ABNORMAL LOW (ref 22–32)
CO2: 20 mmol/L — ABNORMAL LOW (ref 22–32)
Calcium: 7.2 mg/dL — ABNORMAL LOW (ref 8.9–10.3)
Calcium: 7.3 mg/dL — ABNORMAL LOW (ref 8.9–10.3)
Chloride: 119 mmol/L — ABNORMAL HIGH (ref 98–111)
Chloride: 117 mmol/L — ABNORMAL HIGH (ref 98–111)
Creatinine, Ser: 4.01 mg/dL — ABNORMAL HIGH (ref 0.44–1.00)
Creatinine, Ser: 4.11 mg/dL — ABNORMAL HIGH (ref 0.44–1.00)
GFR, Estimated: 11 mL/min — ABNORMAL LOW (ref >60)
GFR, Estimated: 11 mL/min — ABNORMAL LOW (ref >60)
Glucose, Bld: 122 mg/dL — ABNORMAL HIGH (ref 70–99)
Glucose, Bld: 138 mg/dL — ABNORMAL HIGH (ref 70–99)
Potassium: 4.4 mmol/L (ref 3.5–5.1)
Potassium: 5.2 mmol/L — ABNORMAL HIGH (ref 3.5–5.1)
Sodium: 142 mmol/L (ref 135–145)
Sodium: 146 mmol/L — ABNORMAL HIGH (ref 135–145)

## 2020-06-23 LAB — GLUCOSE, CAPILLARY
Glucose-Capillary: 122 mg/dL — ABNORMAL HIGH (ref 70–99)
Glucose-Capillary: 125 mg/dL — ABNORMAL HIGH (ref 70–99)
Glucose-Capillary: 136 mg/dL — ABNORMAL HIGH (ref 70–99)
Glucose-Capillary: 99 mg/dL (ref 70–99)

## 2020-06-23 LAB — BPAM PLATELET PHERESIS
Blood Product Expiration Date: 202204142359
Unit Type and Rh: 7300

## 2020-06-23 LAB — SURGICAL PCR SCREEN
MRSA, PCR: NEGATIVE
Staphylococcus aureus: POSITIVE — AB

## 2020-06-23 LAB — CK: Total CK: 177 U/L (ref 38–234)

## 2020-06-23 SURGERY — LUMBAR PERCUTANEOUS PEDICLE SCREW 4 LEVEL
Anesthesia: General | Site: Back

## 2020-06-23 MED ORDER — MUPIROCIN 2 % EX OINT
1.0000 "application " | TOPICAL_OINTMENT | Freq: Two times a day (BID) | CUTANEOUS | Status: AC
  Administered 2020-06-23 – 2020-06-27 (×10): 1 via NASAL
  Filled 2020-06-23: qty 22

## 2020-06-23 MED ORDER — ARTIFICIAL TEARS OPHTHALMIC OINT
TOPICAL_OINTMENT | OPHTHALMIC | Status: DC | PRN
  Administered 2020-06-23: 1 via OPHTHALMIC

## 2020-06-23 MED ORDER — PHENYLEPHRINE 40 MCG/ML (10ML) SYRINGE FOR IV PUSH (FOR BLOOD PRESSURE SUPPORT)
PREFILLED_SYRINGE | INTRAVENOUS | Status: AC
  Filled 2020-06-23: qty 10

## 2020-06-23 MED ORDER — EPHEDRINE SULFATE-NACL 50-0.9 MG/10ML-% IV SOSY
PREFILLED_SYRINGE | INTRAVENOUS | Status: DC | PRN
  Administered 2020-06-23 (×3): 10 mg via INTRAVENOUS

## 2020-06-23 MED ORDER — PROPOFOL 10 MG/ML IV BOLUS
INTRAVENOUS | Status: AC
  Filled 2020-06-23: qty 20

## 2020-06-23 MED ORDER — SODIUM CHLORIDE 0.9 % IV SOLN: INTRAVENOUS | Status: DC | PRN

## 2020-06-23 MED ORDER — SODIUM BICARBONATE 8.4 % IV SOLN
INTRAVENOUS | Status: AC
  Filled 2020-06-23: qty 50

## 2020-06-23 MED ORDER — VANCOMYCIN HCL 1000 MG IV SOLR
INTRAVENOUS | Status: AC
  Filled 2020-06-23: qty 1000

## 2020-06-23 MED ORDER — SODIUM CHLORIDE 0.9% IV SOLUTION: Freq: Once | INTRAVENOUS | Status: DC

## 2020-06-23 MED ORDER — PHENYLEPHRINE HCL-NACL 10-0.9 MG/250ML-% IV SOLN
INTRAVENOUS | Status: AC
  Administered 2020-06-23: 20 ug/min via INTRAVENOUS
  Filled 2020-06-23: qty 250

## 2020-06-23 MED ORDER — VANCOMYCIN HCL 1000 MG IV SOLR
INTRAVENOUS | Status: DC | PRN
  Administered 2020-06-23: 1000 mg

## 2020-06-23 MED ORDER — FENTANYL CITRATE (PF) 250 MCG/5ML IJ SOLN
INTRAMUSCULAR | Status: AC
  Filled 2020-06-23: qty 5

## 2020-06-23 MED ORDER — OXYCODONE HCL 5 MG/5ML PO SOLN: 5.0000 mg | ORAL | Status: DC | PRN

## 2020-06-23 MED ORDER — PROPOFOL 1000 MG/100ML IV EMUL
5.0000 ug/kg/min | INTRAVENOUS | Status: DC
  Administered 2020-06-23: 20 ug/kg/min via INTRAVENOUS
  Filled 2020-06-23: qty 100

## 2020-06-23 MED ORDER — ROCURONIUM BROMIDE 10 MG/ML (PF) SYRINGE
PREFILLED_SYRINGE | INTRAVENOUS | Status: AC
  Filled 2020-06-23: qty 10

## 2020-06-23 MED ORDER — ALBUMIN HUMAN 5 % IV SOLN
25.0000 g | Freq: Once | INTRAVENOUS | Status: AC
  Administered 2020-06-23: 25 g via INTRAVENOUS
  Filled 2020-06-23: qty 500

## 2020-06-23 MED ORDER — THROMBIN 5000 UNITS EX SOLR: OROMUCOSAL | Status: DC | PRN

## 2020-06-23 MED ORDER — SODIUM CHLORIDE 0.9 % IV SOLN
20.0000 ug | INTRAVENOUS | Status: AC
  Administered 2020-06-23: 20 ug via INTRAVENOUS
  Filled 2020-06-23: qty 5

## 2020-06-23 MED ORDER — MIDAZOLAM HCL 2 MG/2ML IJ SOLN
INTRAMUSCULAR | Status: AC
  Filled 2020-06-23: qty 2

## 2020-06-23 MED ORDER — EPHEDRINE 5 MG/ML INJ
INTRAVENOUS | Status: AC
  Filled 2020-06-23: qty 10

## 2020-06-23 MED ORDER — MIDAZOLAM 50MG/50ML (1MG/ML) PREMIX INFUSION
0.5000 mg/h | INTRAVENOUS | Status: DC
  Administered 2020-06-23 (×2): 1 mg/h via INTRAVENOUS
  Administered 2020-06-23: 2 mg/h via INTRAVENOUS
  Administered 2020-06-25 (×2): 1 mg/h via INTRAVENOUS
  Filled 2020-06-23 (×3): qty 50

## 2020-06-23 MED ORDER — THROMBIN 5000 UNITS EX SOLR
CUTANEOUS | Status: AC
  Filled 2020-06-23: qty 5000

## 2020-06-23 MED ORDER — PHENYLEPHRINE HCL-NACL 10-0.9 MG/250ML-% IV SOLN
INTRAVENOUS | Status: DC | PRN
  Administered 2020-06-23: 85 ug/min via INTRAVENOUS
  Administered 2020-06-23: 100 ug/min via INTRAVENOUS

## 2020-06-23 MED ORDER — ALBUMIN HUMAN 5 % IV SOLN: 12.5000 g | Freq: Once | INTRAVENOUS | Status: DC

## 2020-06-23 MED ORDER — LIDOCAINE 2% (20 MG/ML) 5 ML SYRINGE
INTRAMUSCULAR | Status: AC
  Filled 2020-06-23: qty 5

## 2020-06-23 MED ORDER — SODIUM BICARBONATE 8.4 % IV SOLN
INTRAVENOUS | Status: DC | PRN
  Administered 2020-06-23 (×5): 50 mL via INTRAVENOUS

## 2020-06-23 MED ORDER — 0.9 % SODIUM CHLORIDE (POUR BTL) OPTIME
TOPICAL | Status: DC | PRN
  Administered 2020-06-23 (×2): 1000 mL

## 2020-06-23 MED ORDER — LIDOCAINE-EPINEPHRINE 1 %-1:100000 IJ SOLN
INTRAMUSCULAR | Status: DC | PRN
  Administered 2020-06-23: 17 mL

## 2020-06-23 MED ORDER — CALCIUM CHLORIDE 10 % IV SOLN
INTRAVENOUS | Status: DC | PRN
  Administered 2020-06-23: .2 g via INTRAVENOUS
  Administered 2020-06-23: .5 g via INTRAVENOUS

## 2020-06-23 MED ORDER — LACTATED RINGERS IV SOLN: INTRAVENOUS | Status: DC | PRN

## 2020-06-23 MED ORDER — FENTANYL CITRATE (PF) 250 MCG/5ML IJ SOLN
INTRAMUSCULAR | Status: DC | PRN
  Administered 2020-06-23: 50 ug via INTRAVENOUS

## 2020-06-23 MED ORDER — CEFAZOLIN SODIUM-DEXTROSE 2-3 GM-%(50ML) IV SOLR
INTRAVENOUS | Status: DC | PRN
  Administered 2020-06-23: 2 g via INTRAVENOUS

## 2020-06-23 MED ORDER — VASOPRESSIN 20 UNIT/ML IV SOLN
INTRAVENOUS | Status: DC | PRN
  Administered 2020-06-23: .5 [IU] via INTRAVENOUS
  Administered 2020-06-23 (×3): 1 [IU] via INTRAVENOUS
  Administered 2020-06-23: 2.5 [IU] via INTRAVENOUS

## 2020-06-23 MED ORDER — ALBUMIN HUMAN 5 % IV SOLN: INTRAVENOUS | Status: DC | PRN

## 2020-06-23 MED ORDER — DEXAMETHASONE SODIUM PHOSPHATE 10 MG/ML IJ SOLN
INTRAMUSCULAR | Status: DC | PRN
  Administered 2020-06-23: 10 mg via INTRAVENOUS

## 2020-06-23 MED ORDER — CEFAZOLIN SODIUM-DEXTROSE 1-4 GM/50ML-% IV SOLN
1.0000 g | Freq: Two times a day (BID) | INTRAVENOUS | Status: AC
  Administered 2020-06-23 – 2020-06-24 (×2): 1 g via INTRAVENOUS
  Filled 2020-06-23 (×2): qty 50

## 2020-06-23 MED ORDER — LIDOCAINE-EPINEPHRINE 1 %-1:100000 IJ SOLN
INTRAMUSCULAR | Status: AC
  Filled 2020-06-23: qty 1

## 2020-06-23 MED ORDER — DEXAMETHASONE SODIUM PHOSPHATE 10 MG/ML IJ SOLN
INTRAMUSCULAR | Status: AC
  Filled 2020-06-23: qty 1

## 2020-06-23 MED ORDER — ARTIFICIAL TEARS OPHTHALMIC OINT
TOPICAL_OINTMENT | OPHTHALMIC | Status: AC
  Filled 2020-06-23: qty 3.5

## 2020-06-23 MED ORDER — ROCURONIUM BROMIDE 10 MG/ML (PF) SYRINGE
PREFILLED_SYRINGE | INTRAVENOUS | Status: DC | PRN
  Administered 2020-06-23: 50 mg via INTRAVENOUS
  Administered 2020-06-23: 20 mg via INTRAVENOUS
  Administered 2020-06-23: 10 mg via INTRAVENOUS
  Administered 2020-06-23: 40 mg via INTRAVENOUS
  Administered 2020-06-23: 50 mg via INTRAVENOUS

## 2020-06-23 MED ORDER — PHENYLEPHRINE HCL-NACL 10-0.9 MG/250ML-% IV SOLN: 0.0000 ug/min | INTRAVENOUS | Status: DC

## 2020-06-23 MED ORDER — PHENYLEPHRINE HCL-NACL 10-0.9 MG/250ML-% IV SOLN
INTRAVENOUS | Status: AC
  Filled 2020-06-23: qty 500

## 2020-06-23 MED ORDER — VASOPRESSIN 20 UNIT/ML IV SOLN
INTRAVENOUS | Status: AC
  Filled 2020-06-23: qty 1

## 2020-06-23 MED ORDER — CALCIUM CHLORIDE 10 % IV SOLN
INTRAVENOUS | Status: AC
  Filled 2020-06-23: qty 10

## 2020-06-23 MED ORDER — ONDANSETRON HCL 4 MG/2ML IJ SOLN
INTRAMUSCULAR | Status: AC
  Filled 2020-06-23: qty 2

## 2020-06-23 SURGICAL SUPPLY — 74 items
ADAPTER DRIVER T25 (MISCELLANEOUS) IMPLANT
BAND INSRT 18 STRL LF DISP RB (MISCELLANEOUS)
BAND RUBBER #18 3X1/16 STRL (MISCELLANEOUS) ×4 IMPLANT
BLADE CLIPPER SURG (BLADE) IMPLANT
BUR MATCHSTICK NEURO 3.0 LAGG (BURR) ×2 IMPLANT
CANISTER SUCT 3000ML PPV (MISCELLANEOUS) ×6 IMPLANT
COVER WAND RF STERILE (DRAPES) ×2 IMPLANT
DECANTER SPIKE VIAL GLASS SM (MISCELLANEOUS) ×2 IMPLANT
DEVICE BONE FILLER KYPHON SZ3 (MISCELLANEOUS) IMPLANT
DRAIN JACKSON PRATT 10MM FLAT (MISCELLANEOUS) ×2 IMPLANT
DRAPE C-ARM 42X72 X-RAY (DRAPES) ×8 IMPLANT
DRAPE LAPAROTOMY 100X72X124 (DRAPES) ×4 IMPLANT
DRAPE POUCH INSTRU U-SHP 10X18 (DRAPES) ×2 IMPLANT
DRAPE SCAN PATIENT (DRAPES) ×4 IMPLANT
DRAPE SURG 17X23 STRL (DRAPES) ×2 IMPLANT
DRAPE WARM FLUID 44X44 (DRAPES) ×2 IMPLANT
DRIVER ADAPTER T25 (MISCELLANEOUS) ×40
DRSG AQUACEL AG ADV 3.5X14 (GAUZE/BANDAGES/DRESSINGS) ×2 IMPLANT
DURAPREP 26ML APPLICATOR (WOUND CARE) ×4 IMPLANT
ELECT BLADE INSULATED 6.5IN (ELECTROSURGICAL)
ELECT COATED BLADE 2.86 ST (ELECTRODE) ×4 IMPLANT
ELECT REM PT RETURN 9FT ADLT (ELECTROSURGICAL) ×8
ELECTRODE BLDE INSULATED 6.5IN (ELECTROSURGICAL) ×2 IMPLANT
ELECTRODE REM PT RTRN 9FT ADLT (ELECTROSURGICAL) ×2 IMPLANT
EVACUATOR SILICONE 100CC (DRAIN) ×2 IMPLANT
GAUZE 4X4 16PLY RFD (DISPOSABLE) IMPLANT
GAUZE SPONGE 4X4 12PLY STRL (GAUZE/BANDAGES/DRESSINGS) IMPLANT
GLOVE BIOGEL PI IND STRL 7.5 (GLOVE) ×2 IMPLANT
GLOVE BIOGEL PI INDICATOR 7.5 (GLOVE) ×2
GLOVE ECLIPSE 7.0 STRL STRAW (GLOVE) IMPLANT
GLOVE ECLIPSE 7.5 STRL STRAW (GLOVE) ×4 IMPLANT
GLOVE SRG 8 PF TXTR STRL LF DI (GLOVE) IMPLANT
GLOVE SURG LTX SZ7.5 (GLOVE) ×2 IMPLANT
GLOVE SURG LTX SZ8 (GLOVE) ×2 IMPLANT
GLOVE SURG POLYISO LF SZ7 (GLOVE) ×2 IMPLANT
GLOVE SURG UNDER POLY LF SZ7 (GLOVE) IMPLANT
GLOVE SURG UNDER POLY LF SZ7.5 (GLOVE) ×4 IMPLANT
GLOVE SURG UNDER POLY LF SZ8 (GLOVE) ×4
GOWN STRL REUS W/ TWL LRG LVL3 (GOWN DISPOSABLE) ×4 IMPLANT
GOWN STRL REUS W/ TWL XL LVL3 (GOWN DISPOSABLE) ×2 IMPLANT
GOWN STRL REUS W/TWL 2XL LVL3 (GOWN DISPOSABLE) IMPLANT
GOWN STRL REUS W/TWL LRG LVL3 (GOWN DISPOSABLE) ×8
GOWN STRL REUS W/TWL XL LVL3 (GOWN DISPOSABLE) ×8
GRAFT BN 10X1XDBM MAGNIFUSE (Bone Implant) IMPLANT
GRAFT BN 5X1XSPNE CVD POST DBM (Bone Implant) IMPLANT
GRAFT BONE MAGNIFUSE 1X10CM (Bone Implant) ×4 IMPLANT
GRAFT BONE MAGNIFUSE 1X5CM (Bone Implant) ×4 IMPLANT
HEMOSTAT POWDER KIT SURGIFOAM (HEMOSTASIS) ×6 IMPLANT
KIT BASIN OR (CUSTOM PROCEDURE TRAY) ×4 IMPLANT
KIT TURNOVER KIT B (KITS) ×4 IMPLANT
KYPHON BFD (MISCELLANEOUS) ×16
MARKER SPHERE PSV REFLC 13MM (MARKER) ×14 IMPLANT
NDL HYPO 18GX1.5 BLUNT FILL (NEEDLE) IMPLANT
NEEDLE HYPO 18GX1.5 BLUNT FILL (NEEDLE) IMPLANT
NEEDLE HYPO 22GX1.5 SAFETY (NEEDLE) ×4 IMPLANT
NS IRRIG 1000ML POUR BTL (IV SOLUTION) ×4 IMPLANT
PACK LAMINECTOMY NEURO (CUSTOM PROCEDURE TRAY) ×4 IMPLANT
PAD ARMBOARD 7.5X6 YLW CONV (MISCELLANEOUS) ×16 IMPLANT
ROD PED SOLERA STRT 5.5X500 (Rod) ×2 IMPLANT
SCREW SET SOLERA (Screw) ×48 IMPLANT
SCREW SET SOLERA TI5.5 (Screw) IMPLANT
SCREW SOLERA 4.5X30 (Screw) ×12 IMPLANT
SCREW SOLERA 4.5X35 (Screw) ×12 IMPLANT
SLEEVE SURGEON STRL (DRAPES) ×2 IMPLANT
SPONGE LAP 4X18 RFD (DISPOSABLE) IMPLANT
SPONGE SURGIFOAM ABS GEL SZ50 (HEMOSTASIS) IMPLANT
STAPLER SKIN PROX WIDE 3.9 (STAPLE) ×4 IMPLANT
SUT MNCRL AB 4-0 PS2 18 (SUTURE) ×2 IMPLANT
SUT VIC AB 0 CT1 18XCR BRD8 (SUTURE) IMPLANT
SUT VIC AB 0 CT1 8-18 (SUTURE) ×8
SUT VIC AB 2-0 CP2 18 (SUTURE) ×6 IMPLANT
TOWEL GREEN STERILE (TOWEL DISPOSABLE) ×6 IMPLANT
TOWEL GREEN STERILE FF (TOWEL DISPOSABLE) ×4 IMPLANT
WATER STERILE IRR 1000ML POUR (IV SOLUTION) ×4 IMPLANT
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
## 2020-06-23 NOTE — Anesthesia Postprocedure Evaluation (Signed)
Anesthesia Post Note  Patient: Meghan Ford  Procedure(s) Performed: Thoracic two-Thoracic eight Posterior Instrumented Fusion with vertebral augmentation with cement reduction of fracture with Airo (N/A Back) APPLICATION OF INTRAOPERATIVE CT SCAN (N/A )     Patient location during evaluation: SICU Anesthesia Type: General Level of consciousness: sedated and patient remains intubated per anesthesia plan Pain management: pain level controlled Vital Signs Assessment: post-procedure vital signs reviewed and stable Respiratory status: patient remains intubated per anesthesia plan and patient on ventilator - see flowsheet for VS Cardiovascular status: unstable Anesthetic complications: no   No complications documented.  Last Vitals:  Vitals:   07/20/2020 1755 07-20-2020 1800  BP:  138/62  Pulse:  74  Resp:  18  Temp:    SpO2: 98% 98%    Last Pain:  Vitals:   07-20-20 0800  TempSrc: Axillary                 Lewie Loron

## 2020-06-23 NOTE — Procedures (Signed)
Chest tube insertion  Date/Time: 07-01-20 6:57 AM Performed by: Rodman Pickle, MD Authorized by: Rodman Pickle, MD   Consent:    Consent obtained:  Emergent situation Sedation:    Sedation type:  Deep Anesthesia (see MAR for exact dosages):    Anesthesia method:  Local infiltration   Local anesthetic:  Lidocaine 1% w/o epi Procedure details:    Placement location:  L lateral   Scalpel size:  11   Tube size (Fr):  16   Technique: Seldinger     Ultrasound guidance: no     Tension pneumothorax: no     Tube connected to:  Suction   Drainage characteristics:  Bloody   Suture material:  2-0 silk   Dressing:  4x4 sterile gauze
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
## 2020-06-23 NOTE — Transfer of Care (Signed)
Immediate Anesthesia Transfer of Care Note  Patient: Meghan Ford  Procedure(s) Performed: Thoracic two-Thoracic eight Posterior Instrumented Fusion with vertebral augmentation with cement reduction of fracture with Airo (N/A Back) APPLICATION OF INTRAOPERATIVE CT SCAN (N/A )  Patient Location: ICU  Anesthesia Type:General  Level of Consciousness: Patient remains intubated per anesthesia plan  Airway & Oxygen Therapy: Patient placed on Ventilator (see vital sign flow sheet for setting)  Post-op Assessment: Report given to RN and Post -op Vital signs reviewed and stable  Post vital signs: Reviewed  Last Vitals:  Vitals Value Taken Time  BP 149/44 (aline)   Temp    Pulse 74 07/09/2020 1757  Resp 18 06/29/2020 1757  SpO2 98 % 06/25/2020 1757  Vitals shown include unvalidated device data.  Last Pain:  Vitals:   07/02/2020 0800  TempSrc: Axillary         Complications: No complications documented.
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
## 2020-06-24 ENCOUNTER — Inpatient Hospital Stay (HOSPITAL_COMMUNITY): Payer: No Typology Code available for payment source

## 2020-06-24 ENCOUNTER — Encounter (HOSPITAL_COMMUNITY): Payer: Self-pay | Admitting: Neurosurgery

## 2020-06-24 LAB — BPAM RBC
Blood Product Expiration Date: 202204232359
Blood Product Expiration Date: 202204272359
Blood Product Expiration Date: 202204292359
Blood Product Expiration Date: 202205052359
Blood Product Expiration Date: 202205052359
Blood Product Expiration Date: 202205062359
Blood Product Expiration Date: 202205102359
Blood Product Expiration Date: 202205112359
Blood Product Expiration Date: 202205112359
Blood Product Expiration Date: 202205112359
Blood Product Expiration Date: 202205112359
Blood Product Expiration Date: 202205112359
Blood Product Expiration Date: 202205112359
Blood Product Expiration Date: 202205142359
Blood Product Expiration Date: 202205142359
Blood Product Expiration Date: 202205142359
Blood Product Expiration Date: 202205142359
Blood Product Expiration Date: 202205152359
Blood Product Expiration Date: 202205152359
ISSUE DATE / TIME: 202204062300
ISSUE DATE / TIME: 202204101102
ISSUE DATE / TIME: 202204101124
ISSUE DATE / TIME: 202204101128
ISSUE DATE / TIME: 202204101128
ISSUE DATE / TIME: 202204101128
ISSUE DATE / TIME: 202204101128
ISSUE DATE / TIME: 202204101134
ISSUE DATE / TIME: 202204101147
ISSUE DATE / TIME: 202204110853
ISSUE DATE / TIME: 202204110853
ISSUE DATE / TIME: 202204121640
ISSUE DATE / TIME: 202204130826
ISSUE DATE / TIME: 202204131354
ISSUE DATE / TIME: 202204131354
ISSUE DATE / TIME: 202204140820
ISSUE DATE / TIME: 202204140820
Unit Type and Rh: 5100
Unit Type and Rh: 5100
Unit Type and Rh: 5100
Unit Type and Rh: 5100
Unit Type and Rh: 5100
Unit Type and Rh: 5100
Unit Type and Rh: 5100
Unit Type and Rh: 5100
Unit Type and Rh: 5100
Unit Type and Rh: 5100
Unit Type and Rh: 5100
Unit Type and Rh: 5100
Unit Type and Rh: 5100
Unit Type and Rh: 5100
Unit Type and Rh: 5100
Unit Type and Rh: 5100
Unit Type and Rh: 5100
Unit Type and Rh: 5100
Unit Type and Rh: 5100

## 2020-06-24 LAB — TYPE AND SCREEN
ABO/RH(D): O POS
Antibody Screen: NEGATIVE
Unit division: 0
Unit division: 0
Unit division: 0
Unit division: 0
Unit division: 0
Unit division: 0
Unit division: 0
Unit division: 0
Unit division: 0
Unit division: 0
Unit division: 0
Unit division: 0
Unit division: 0
Unit division: 0
Unit division: 0
Unit division: 0
Unit division: 0
Unit division: 0
Unit division: 0

## 2020-06-24 LAB — RENAL FUNCTION PANEL
Albumin: 2 g/dL — ABNORMAL LOW (ref 3.5–5.0)
Anion gap: 10 (ref 5–15)
BUN: 73 mg/dL — ABNORMAL HIGH (ref 8–23)
CO2: 17 mmol/L — ABNORMAL LOW (ref 22–32)
Calcium: 7 mg/dL — ABNORMAL LOW (ref 8.9–10.3)
Chloride: 116 mmol/L — ABNORMAL HIGH (ref 98–111)
Creatinine, Ser: 4.24 mg/dL — ABNORMAL HIGH (ref 0.44–1.00)
GFR, Estimated: 10 mL/min — ABNORMAL LOW (ref >60)
Glucose, Bld: 208 mg/dL — ABNORMAL HIGH (ref 70–99)
Phosphorus: 5.2 mg/dL — ABNORMAL HIGH (ref 2.5–4.6)
Potassium: 4.7 mmol/L (ref 3.5–5.1)
Sodium: 143 mmol/L (ref 135–145)

## 2020-06-24 LAB — BPAM PLATELET PHERESIS
Blood Product Expiration Date: 202204142359
Blood Product Expiration Date: 202204152359
ISSUE DATE / TIME: 202204131143
ISSUE DATE / TIME: 202204131205
Unit Type and Rh: 600
Unit Type and Rh: 6200

## 2020-06-24 LAB — BASIC METABOLIC PANEL
Anion gap: 11 (ref 5–15)
BUN: 63 mg/dL — ABNORMAL HIGH (ref 8–23)
CO2: 18 mmol/L — ABNORMAL LOW (ref 22–32)
Calcium: 7.2 mg/dL — ABNORMAL LOW (ref 8.9–10.3)
Chloride: 115 mmol/L — ABNORMAL HIGH (ref 98–111)
Creatinine, Ser: 4.34 mg/dL — ABNORMAL HIGH (ref 0.44–1.00)
GFR, Estimated: 10 mL/min — ABNORMAL LOW (ref >60)
Glucose, Bld: 167 mg/dL — ABNORMAL HIGH (ref 70–99)
Potassium: 5 mmol/L (ref 3.5–5.1)
Sodium: 144 mmol/L (ref 135–145)

## 2020-06-24 LAB — PREPARE FRESH FROZEN PLASMA

## 2020-06-24 LAB — GLUCOSE, CAPILLARY
Glucose-Capillary: 133 mg/dL — ABNORMAL HIGH (ref 70–99)
Glucose-Capillary: 137 mg/dL — ABNORMAL HIGH (ref 70–99)
Glucose-Capillary: 166 mg/dL — ABNORMAL HIGH (ref 70–99)
Glucose-Capillary: 197 mg/dL — ABNORMAL HIGH (ref 70–99)
Glucose-Capillary: 213 mg/dL — ABNORMAL HIGH (ref 70–99)
Glucose-Capillary: 220 mg/dL — ABNORMAL HIGH (ref 70–99)
Glucose-Capillary: 222 mg/dL — ABNORMAL HIGH (ref 70–99)

## 2020-06-24 LAB — PREPARE PLATELET PHERESIS
Unit division: 0
Unit division: 0

## 2020-06-24 LAB — CBC
HCT: 28.1 % — ABNORMAL LOW (ref 36.0–46.0)
Hemoglobin: 9.1 g/dL — ABNORMAL LOW (ref 12.0–15.0)
MCH: 29.1 pg (ref 26.0–34.0)
MCHC: 32.4 g/dL (ref 30.0–36.0)
MCV: 89.8 fL (ref 80.0–100.0)
Platelets: 65 10*3/uL — ABNORMAL LOW (ref 150–400)
RBC: 3.13 MIL/uL — ABNORMAL LOW (ref 3.87–5.11)
RDW: 17.6 % — ABNORMAL HIGH (ref 11.5–15.5)
WBC: 4.8 10*3/uL (ref 4.0–10.5)
nRBC: 1.9 % — ABNORMAL HIGH (ref 0.0–0.2)

## 2020-06-24 LAB — BPAM FFP
Blood Product Expiration Date: 202204152359
Blood Product Expiration Date: 202204182359
ISSUE DATE / TIME: 202204131421
ISSUE DATE / TIME: 202204131421
Unit Type and Rh: 600
Unit Type and Rh: 6200

## 2020-06-24 MED ORDER — PRISMASOL BGK 4/2.5 32-4-2.5 MEQ/L REPLACEMENT SOLN
Status: DC
  Filled 2020-06-24 (×2): qty 5000

## 2020-06-24 MED ORDER — DEXTROSE 5 % IV SOLN
20.0000 g | INTRAVENOUS | Status: DC
  Administered 2020-06-24 – 2020-06-28 (×6): 20 g via INTRAVENOUS_CENTRAL
  Filled 2020-06-24 (×9): qty 200

## 2020-06-24 MED ORDER — ACD FORMULA A 0.73-2.45-2.2 GM/100ML VI SOLN
3000.0000 mL | Status: DC
  Administered 2020-06-24 – 2020-06-25 (×2): 3000 mL via INTRAVENOUS_CENTRAL
  Administered 2020-06-25: 1000 mL via INTRAVENOUS_CENTRAL
  Administered 2020-06-25 – 2020-06-27 (×5): 3000 mL via INTRAVENOUS_CENTRAL
  Administered 2020-06-27 (×3): 1000 mL via INTRAVENOUS_CENTRAL
  Administered 2020-06-28: 3000 mL via INTRAVENOUS_CENTRAL
  Filled 2020-06-24: qty 3000
  Filled 2020-06-24: qty 1000
  Filled 2020-06-24 (×11): qty 3000

## 2020-06-24 MED ORDER — PRISMASOL BGK 4/2.5 32-4-2.5 MEQ/L EC SOLN
Status: DC
  Filled 2020-06-24 (×76): qty 5000

## 2020-06-24 MED ORDER — HEPARIN SODIUM (PORCINE) 1000 UNIT/ML DIALYSIS
1000.0000 [IU] | INTRAMUSCULAR | Status: DC | PRN
  Administered 2020-06-24: 2000 [IU] via INTRAVENOUS_CENTRAL
  Administered 2020-06-25: 3000 [IU] via INTRAVENOUS_CENTRAL
  Administered 2020-06-28 – 2020-06-29 (×2): 2000 [IU] via INTRAVENOUS_CENTRAL
  Filled 2020-06-24: qty 6
  Filled 2020-06-24: qty 2
  Filled 2020-06-24 (×2): qty 6
  Filled 2020-06-24 (×2): qty 2
  Filled 2020-06-24: qty 6
  Filled 2020-06-24: qty 2
  Filled 2020-06-24: qty 6

## 2020-06-24 MED ORDER — PIVOT 1.5 CAL PO LIQD
1000.0000 mL | ORAL | Status: DC
  Administered 2020-06-25 – 2020-06-30 (×7): 1000 mL
  Filled 2020-06-24: qty 1000

## 2020-06-24 MED ORDER — SODIUM CHLORIDE 0.9 % FOR CRRT: INTRAVENOUS_CENTRAL | Status: DC | PRN

## 2020-06-24 MED ORDER — PRISMASOL BGK 4/2.5 32-4-2.5 MEQ/L REPLACEMENT SOLN
Status: DC
  Filled 2020-06-24: qty 5000

## 2020-06-24 MED FILL — Thrombin For Soln 5000 Unit: CUTANEOUS | Qty: 5000 | Status: AC
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
## 2020-06-24 NOTE — Procedures (Signed)
Central line  Date/Time: 06/24/2020 9:20 AM Performed by: Violeta Gelinas, MD Authorized by: Violeta Gelinas, MD   Consent:    Consent obtained:  Written   Risks, benefits, and alternatives were discussed: yes   Universal protocol:    Procedure explained and questions answered to patient or proxy's satisfaction: yes     Relevant documents present and verified: yes     Test results available: yes     Imaging studies available: yes     Required blood products, implants, devices, and special equipment available: yes     Immediately prior to procedure, a time out was called: yes     Patient identity confirmed:  Hospital-assigned identification number Pre-procedure details:    Hand hygiene: Hand hygiene performed prior to insertion     Sterile barrier technique: All elements of maximal sterile technique followed     Skin preparation:  Chlorhexidine with alcohol Sedation:    Sedation type:  Moderate sedation Procedure details:    Location:  L subclavian   Patient position:  Trendelenburg   Procedural supplies:  Triple lumen   Landmarks identified: yes     Ultrasound guidance: no     Number of attempts:  1   Successful placement: yes   Post-procedure details:    Post-procedure:  Dressing applied and line sutured   Assessment:  Blood return through all ports   Procedure completion:  Tolerated Comments:     Trialysis HD catheter
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
## 2020-06-24 NOTE — Plan of Care (Signed)
  Problem: Nutrition: Goal: Adequate nutrition will be maintained Outcome: Progressing   

## 2020-06-24 NOTE — Progress Notes (Signed)
Patient ID: Meghan Ford, female   DOB: 04-Sep-1940, 80 y.o.   MRN: [id number] Noted plan for CRRT by Renal. I will place a HD cath. I called her son, Meghan Ford, and explained the procedure, risks, and benefits. He agrees. Consent documented.  Violeta Gelinas, MD, MPH, FACS Please use [email or web address] to contact on call provider

## 2020-06-25 ENCOUNTER — Inpatient Hospital Stay (HOSPITAL_COMMUNITY): Payer: No Typology Code available for payment source

## 2020-06-25 LAB — RENAL FUNCTION PANEL
Albumin: 1.8 g/dL — ABNORMAL LOW (ref 3.5–5.0)
Albumin: 1.7 g/dL — ABNORMAL LOW (ref 3.5–5.0)
Anion gap: 10 (ref 5–15)
Anion gap: 6 (ref 5–15)
BUN: 91 mg/dL — ABNORMAL HIGH (ref 8–23)
BUN: 99 mg/dL — ABNORMAL HIGH (ref 8–23)
CO2: 18 mmol/L — ABNORMAL LOW (ref 22–32)
CO2: 20 mmol/L — ABNORMAL LOW (ref 22–32)
Calcium: 7.4 mg/dL — ABNORMAL LOW (ref 8.9–10.3)
Calcium: 7.3 mg/dL — ABNORMAL LOW (ref 8.9–10.3)
Chloride: 116 mmol/L — ABNORMAL HIGH (ref 98–111)
Chloride: 117 mmol/L — ABNORMAL HIGH (ref 98–111)
Creatinine, Ser: 4.14 mg/dL — ABNORMAL HIGH (ref 0.44–1.00)
Creatinine, Ser: 3.68 mg/dL — ABNORMAL HIGH (ref 0.44–1.00)
GFR, Estimated: 11 mL/min — ABNORMAL LOW (ref >60)
GFR, Estimated: 12 mL/min — ABNORMAL LOW (ref >60)
Glucose, Bld: 241 mg/dL — ABNORMAL HIGH (ref 70–99)
Glucose, Bld: 220 mg/dL — ABNORMAL HIGH (ref 70–99)
Phosphorus: 5 mg/dL — ABNORMAL HIGH (ref 2.5–4.6)
Phosphorus: 4.1 mg/dL (ref 2.5–4.6)
Potassium: 4.6 mmol/L (ref 3.5–5.1)
Potassium: 4.6 mmol/L (ref 3.5–5.1)
Sodium: 144 mmol/L (ref 135–145)
Sodium: 143 mmol/L (ref 135–145)

## 2020-06-25 LAB — CBC
HCT: 25.6 % — ABNORMAL LOW (ref 36.0–46.0)
Hemoglobin: 8.4 g/dL — ABNORMAL LOW (ref 12.0–15.0)
MCH: 28.4 pg (ref 26.0–34.0)
MCHC: 32.8 g/dL (ref 30.0–36.0)
MCV: 86.5 fL (ref 80.0–100.0)
Platelets: 56 10*3/uL — ABNORMAL LOW (ref 150–400)
RBC: 2.96 MIL/uL — ABNORMAL LOW (ref 3.87–5.11)
RDW: 17.8 % — ABNORMAL HIGH (ref 11.5–15.5)
WBC: 6.3 10*3/uL (ref 4.0–10.5)
nRBC: 1.8 % — ABNORMAL HIGH (ref 0.0–0.2)

## 2020-06-25 LAB — GLUCOSE, CAPILLARY
Glucose-Capillary: 223 mg/dL — ABNORMAL HIGH (ref 70–99)
Glucose-Capillary: 203 mg/dL — ABNORMAL HIGH (ref 70–99)
Glucose-Capillary: 224 mg/dL — ABNORMAL HIGH (ref 70–99)
Glucose-Capillary: 204 mg/dL — ABNORMAL HIGH (ref 70–99)
Glucose-Capillary: 242 mg/dL — ABNORMAL HIGH (ref 70–99)

## 2020-06-25 LAB — MAGNESIUM: Magnesium: 1.9 mg/dL (ref 1.7–2.4)

## 2020-06-25 MED ORDER — SODIUM CHLORIDE 0.9% FLUSH
10.0000 mL | Freq: Two times a day (BID) | INTRAVENOUS | Status: DC
  Administered 2020-06-26 – 2020-06-28 (×5): 10 mL
  Administered 2020-06-29: 20 mL
  Administered 2020-06-29: 10 mL

## 2020-06-25 MED ORDER — SODIUM CHLORIDE 0.9% FLUSH
10.0000 mL | INTRAVENOUS | Status: DC | PRN
Start: 2020-06-25 — End: 2020-07-01

## 2020-06-25 MED ORDER — CEFAZOLIN SODIUM-DEXTROSE 1-4 GM/50ML-% IV SOLN
1.0000 g | Freq: Two times a day (BID) | INTRAVENOUS | Status: DC
  Administered 2020-06-25: 1 g via INTRAVENOUS
  Filled 2020-06-25 (×2): qty 50

## 2020-06-25 MED ORDER — SODIUM CHLORIDE 0.9 % IV SOLN
2.0000 g | Freq: Two times a day (BID) | INTRAVENOUS | Status: DC
  Administered 2020-06-25 – 2020-06-26 (×3): 2 g via INTRAVENOUS
  Filled 2020-06-25 (×3): qty 2

## 2020-06-25 MED ORDER — SODIUM CHLORIDE 0.9 % IV SOLN
INTRAVENOUS | Status: DC | PRN
  Administered 2020-06-25: 500 mL via INTRAVENOUS

## 2020-06-25 MED ORDER — SODIUM CHLORIDE 0.9 % IV SOLN: 2.0000 g | INTRAVENOUS | Status: DC

## 2020-06-25 MED ORDER — PRISMASOL BGK 4/2.5 32-4-2.5 MEQ/L REPLACEMENT SOLN
Status: DC
  Filled 2020-06-25 (×6): qty 5000

## 2020-06-25 MED ORDER — SODIUM CHLORIDE 0.9 % IV SOLN
2.0000 g | Freq: Once | INTRAVENOUS | Status: AC
  Administered 2020-06-25: 2 g via INTRAVENOUS
  Filled 2020-06-25: qty 2
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
## 2020-06-25 NOTE — Progress Notes (Signed)
Wasted 20 ml of versed  1 mg/ ml with Dennard Nip.

## 2020-06-25 NOTE — Progress Notes (Signed)
Midline placed per nephrology's MD order by Dr. Valentino Nose.

## 2020-06-25 NOTE — Progress Notes (Signed)
Patient can have midline after risks benefits discussed with primary team.
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
## 2020-06-25 NOTE — Procedures (Signed)
   Procedure Note  Date: 06/25/2020  Procedure: central venous catheter placement--right, internal jugular vein, with ultrasound guidance  Pre-op diagnosis: renal dysfunction, need for initiation of renal replacement therapy Post-op diagnosis: same  Surgeon: Diamantina Monks, MD  Anesthesia: local  EBL: <5cc Drains/Implants: 20 cm, triple lumen central venous HD catheter  Description of procedure: Time-out was performed verifying correct patient, procedure, site, laterality, and signature of informed consent. The right neck was prepped and draped in the usual sterile fashion. The right internal jugular vein was localized with ultrasound guidance. Five ccs of local anesthetic was infiltrated at the site of venous access. The right internal jugular vein was accessed using an introducer needle and a guidewire passed through the needle. The needle was removed and a skin nick was made. The tract was dilated and the central venous catheter advanced over the guidewire followed by removal of the guidewire. All ports drew blood easily and all were flushed with saline. The catheter was secured to the skin with suture and a sterile dressing. The patient tolerated the procedure well. There were no immediate complications. Follow up chest x-ray was ordered to confirm positioning and the absence of a pneumothorax.   Diamantina Monks, MD General and Trauma Surgery [facility]

## 2020-06-25 NOTE — Progress Notes (Signed)
PHARMACY NOTE:  ANTIMICROBIAL RENAL DOSAGE ADJUSTMENT  Current antimicrobial regimen includes a mismatch between antimicrobial dosage and estimated renal function.  As per policy approved by the Pharmacy & Therapeutics and Medical Executive Committees, the antimicrobial dosage will be adjusted accordingly.  Current antimicrobial dosage:  Cefepime 2g IV q24h   Indication: PNA  Renal Function:  Estimated Creatinine Clearance: 15.2 mL/min (A) (by C-G formula based on SCr of 4.14 mg/dL (H)). []      On intermittent HD, scheduled: [x]      On CRRT    Antimicrobial dosage has been changed to:  Cefepime 2g IV q12h   Additional comments:  Thank you for allowing pharmacy to be a part of this patient's care.  , PharmD Clinical Pharmacist  06/25/2020 4:19 PM

## 2020-06-26 ENCOUNTER — Inpatient Hospital Stay (HOSPITAL_COMMUNITY): Payer: No Typology Code available for payment source

## 2020-06-26 LAB — POCT I-STAT, CHEM 8
BUN: 66 mg/dL — ABNORMAL HIGH (ref 8–23)
BUN: 66 mg/dL — ABNORMAL HIGH (ref 8–23)
BUN: 69 mg/dL — ABNORMAL HIGH (ref 8–23)
BUN: 73 mg/dL — ABNORMAL HIGH (ref 8–23)
BUN: 73 mg/dL — ABNORMAL HIGH (ref 8–23)
BUN: 80 mg/dL — ABNORMAL HIGH (ref 8–23)
BUN: 83 mg/dL — ABNORMAL HIGH (ref 8–23)
BUN: 90 mg/dL — ABNORMAL HIGH (ref 8–23)
BUN: 48 mg/dL — ABNORMAL HIGH (ref 8–23)
BUN: 52 mg/dL — ABNORMAL HIGH (ref 8–23)
BUN: 52 mg/dL — ABNORMAL HIGH (ref 8–23)
BUN: 54 mg/dL — ABNORMAL HIGH (ref 8–23)
BUN: 55 mg/dL — ABNORMAL HIGH (ref 8–23)
BUN: 56 mg/dL — ABNORMAL HIGH (ref 8–23)
BUN: 58 mg/dL — ABNORMAL HIGH (ref 8–23)
BUN: 58 mg/dL — ABNORMAL HIGH (ref 8–23)
BUN: 59 mg/dL — ABNORMAL HIGH (ref 8–23)
BUN: 60 mg/dL — ABNORMAL HIGH (ref 8–23)
BUN: 62 mg/dL — ABNORMAL HIGH (ref 8–23)
BUN: 67 mg/dL — ABNORMAL HIGH (ref 8–23)
Calcium, Ion: 0.47 mmol/L — CL (ref 1.15–1.40)
Calcium, Ion: 0.49 mmol/L — CL (ref 1.15–1.40)
Calcium, Ion: 0.49 mmol/L — CL (ref 1.15–1.40)
Calcium, Ion: 0.49 mmol/L — CL (ref 1.15–1.40)
Calcium, Ion: 0.99 mmol/L — ABNORMAL LOW (ref 1.15–1.40)
Calcium, Ion: 0.99 mmol/L — ABNORMAL LOW (ref 1.15–1.40)
Calcium, Ion: 0.99 mmol/L — ABNORMAL LOW (ref 1.15–1.40)
Calcium, Ion: 0.99 mmol/L — ABNORMAL LOW (ref 1.15–1.40)
Calcium, Ion: 0.4 mmol/L — CL (ref 1.15–1.40)
Calcium, Ion: 0.4 mmol/L — CL (ref 1.15–1.40)
Calcium, Ion: 0.42 mmol/L — CL (ref 1.15–1.40)
Calcium, Ion: 0.42 mmol/L — CL (ref 1.15–1.40)
Calcium, Ion: 0.45 mmol/L — CL (ref 1.15–1.40)
Calcium, Ion: 0.47 mmol/L — CL (ref 1.15–1.40)
Calcium, Ion: 0.97 mmol/L — ABNORMAL LOW (ref 1.15–1.40)
Calcium, Ion: 1 mmol/L — ABNORMAL LOW (ref 1.15–1.40)
Calcium, Ion: 1.02 mmol/L — ABNORMAL LOW (ref 1.15–1.40)
Calcium, Ion: 1.03 mmol/L — ABNORMAL LOW (ref 1.15–1.40)
Calcium, Ion: 1.04 mmol/L — ABNORMAL LOW (ref 1.15–1.40)
Calcium, Ion: 1.06 mmol/L — ABNORMAL LOW (ref 1.15–1.40)
Chloride: 103 mmol/L (ref 98–111)
Chloride: 104 mmol/L (ref 98–111)
Chloride: 105 mmol/L (ref 98–111)
Chloride: 108 mmol/L (ref 98–111)
Chloride: 110 mmol/L (ref 98–111)
Chloride: 111 mmol/L (ref 98–111)
Chloride: 111 mmol/L (ref 98–111)
Chloride: 115 mmol/L — ABNORMAL HIGH (ref 98–111)
Chloride: 100 mmol/L (ref 98–111)
Chloride: 101 mmol/L (ref 98–111)
Chloride: 101 mmol/L (ref 98–111)
Chloride: 104 mmol/L (ref 98–111)
Chloride: 105 mmol/L (ref 98–111)
Chloride: 106 mmol/L (ref 98–111)
Chloride: 109 mmol/L (ref 98–111)
Chloride: 95 mmol/L — ABNORMAL LOW (ref 98–111)
Chloride: 97 mmol/L — ABNORMAL LOW (ref 98–111)
Chloride: 98 mmol/L (ref 98–111)
Chloride: 99 mmol/L (ref 98–111)
Chloride: 99 mmol/L (ref 98–111)
Creatinine, Ser: 2.3 mg/dL — ABNORMAL HIGH (ref 0.44–1.00)
Creatinine, Ser: 2.4 mg/dL — ABNORMAL HIGH (ref 0.44–1.00)
Creatinine, Ser: 2.5 mg/dL — ABNORMAL HIGH (ref 0.44–1.00)
Creatinine, Ser: 2.6 mg/dL — ABNORMAL HIGH (ref 0.44–1.00)
Creatinine, Ser: 2.7 mg/dL — ABNORMAL HIGH (ref 0.44–1.00)
Creatinine, Ser: 2.7 mg/dL — ABNORMAL HIGH (ref 0.44–1.00)
Creatinine, Ser: 3.1 mg/dL — ABNORMAL HIGH (ref 0.44–1.00)
Creatinine, Ser: 3.7 mg/dL — ABNORMAL HIGH (ref 0.44–1.00)
Creatinine, Ser: 1.5 mg/dL — ABNORMAL HIGH (ref 0.44–1.00)
Creatinine, Ser: 1.7 mg/dL — ABNORMAL HIGH (ref 0.44–1.00)
Creatinine, Ser: 1.7 mg/dL — ABNORMAL HIGH (ref 0.44–1.00)
Creatinine, Ser: 1.8 mg/dL — ABNORMAL HIGH (ref 0.44–1.00)
Creatinine, Ser: 1.8 mg/dL — ABNORMAL HIGH (ref 0.44–1.00)
Creatinine, Ser: 1.9 mg/dL — ABNORMAL HIGH (ref 0.44–1.00)
Creatinine, Ser: 2 mg/dL — ABNORMAL HIGH (ref 0.44–1.00)
Creatinine, Ser: 2 mg/dL — ABNORMAL HIGH (ref 0.44–1.00)
Creatinine, Ser: 2.1 mg/dL — ABNORMAL HIGH (ref 0.44–1.00)
Creatinine, Ser: 2.1 mg/dL — ABNORMAL HIGH (ref 0.44–1.00)
Creatinine, Ser: 2.3 mg/dL — ABNORMAL HIGH (ref 0.44–1.00)
Creatinine, Ser: 2.4 mg/dL — ABNORMAL HIGH (ref 0.44–1.00)
Glucose, Bld: 221 mg/dL — ABNORMAL HIGH (ref 70–99)
Glucose, Bld: 234 mg/dL — ABNORMAL HIGH (ref 70–99)
Glucose, Bld: 235 mg/dL — ABNORMAL HIGH (ref 70–99)
Glucose, Bld: 238 mg/dL — ABNORMAL HIGH (ref 70–99)
Glucose, Bld: 244 mg/dL — ABNORMAL HIGH (ref 70–99)
Glucose, Bld: 260 mg/dL — ABNORMAL HIGH (ref 70–99)
Glucose, Bld: 261 mg/dL — ABNORMAL HIGH (ref 70–99)
Glucose, Bld: 271 mg/dL — ABNORMAL HIGH (ref 70–99)
Glucose, Bld: 221 mg/dL — ABNORMAL HIGH (ref 70–99)
Glucose, Bld: 246 mg/dL — ABNORMAL HIGH (ref 70–99)
Glucose, Bld: 246 mg/dL — ABNORMAL HIGH (ref 70–99)
Glucose, Bld: 251 mg/dL — ABNORMAL HIGH (ref 70–99)
Glucose, Bld: 258 mg/dL — ABNORMAL HIGH (ref 70–99)
Glucose, Bld: 264 mg/dL — ABNORMAL HIGH (ref 70–99)
Glucose, Bld: 268 mg/dL — ABNORMAL HIGH (ref 70–99)
Glucose, Bld: 272 mg/dL — ABNORMAL HIGH (ref 70–99)
Glucose, Bld: 276 mg/dL — ABNORMAL HIGH (ref 70–99)
Glucose, Bld: 286 mg/dL — ABNORMAL HIGH (ref 70–99)
Glucose, Bld: 294 mg/dL — ABNORMAL HIGH (ref 70–99)
Glucose, Bld: 295 mg/dL — ABNORMAL HIGH (ref 70–99)
HCT: 21 % — ABNORMAL LOW (ref 36.0–46.0)
HCT: 21 % — ABNORMAL LOW (ref 36.0–46.0)
HCT: 21 % — ABNORMAL LOW (ref 36.0–46.0)
HCT: 22 % — ABNORMAL LOW (ref 36.0–46.0)
HCT: 23 % — ABNORMAL LOW (ref 36.0–46.0)
HCT: 23 % — ABNORMAL LOW (ref 36.0–46.0)
HCT: 25 % — ABNORMAL LOW (ref 36.0–46.0)
HCT: 26 % — ABNORMAL LOW (ref 36.0–46.0)
HCT: 21 % — ABNORMAL LOW (ref 36.0–46.0)
HCT: 21 % — ABNORMAL LOW (ref 36.0–46.0)
HCT: 22 % — ABNORMAL LOW (ref 36.0–46.0)
HCT: 23 % — ABNORMAL LOW (ref 36.0–46.0)
HCT: 23 % — ABNORMAL LOW (ref 36.0–46.0)
HCT: 23 % — ABNORMAL LOW (ref 36.0–46.0)
HCT: 23 % — ABNORMAL LOW (ref 36.0–46.0)
HCT: 23 % — ABNORMAL LOW (ref 36.0–46.0)
HCT: 24 % — ABNORMAL LOW (ref 36.0–46.0)
HCT: 25 % — ABNORMAL LOW (ref 36.0–46.0)
HCT: 26 % — ABNORMAL LOW (ref 36.0–46.0)
HCT: 26 % — ABNORMAL LOW (ref 36.0–46.0)
Hemoglobin: 7.1 g/dL — ABNORMAL LOW (ref 12.0–15.0)
Hemoglobin: 7.1 g/dL — ABNORMAL LOW (ref 12.0–15.0)
Hemoglobin: 7.1 g/dL — ABNORMAL LOW (ref 12.0–15.0)
Hemoglobin: 7.5 g/dL — ABNORMAL LOW (ref 12.0–15.0)
Hemoglobin: 7.8 g/dL — ABNORMAL LOW (ref 12.0–15.0)
Hemoglobin: 7.8 g/dL — ABNORMAL LOW (ref 12.0–15.0)
Hemoglobin: 8.5 g/dL — ABNORMAL LOW (ref 12.0–15.0)
Hemoglobin: 8.8 g/dL — ABNORMAL LOW (ref 12.0–15.0)
Hemoglobin: 7.1 g/dL — ABNORMAL LOW (ref 12.0–15.0)
Hemoglobin: 7.1 g/dL — ABNORMAL LOW (ref 12.0–15.0)
Hemoglobin: 7.5 g/dL — ABNORMAL LOW (ref 12.0–15.0)
Hemoglobin: 7.8 g/dL — ABNORMAL LOW (ref 12.0–15.0)
Hemoglobin: 7.8 g/dL — ABNORMAL LOW (ref 12.0–15.0)
Hemoglobin: 7.8 g/dL — ABNORMAL LOW (ref 12.0–15.0)
Hemoglobin: 7.8 g/dL — ABNORMAL LOW (ref 12.0–15.0)
Hemoglobin: 7.8 g/dL — ABNORMAL LOW (ref 12.0–15.0)
Hemoglobin: 8.2 g/dL — ABNORMAL LOW (ref 12.0–15.0)
Hemoglobin: 8.5 g/dL — ABNORMAL LOW (ref 12.0–15.0)
Hemoglobin: 8.8 g/dL — ABNORMAL LOW (ref 12.0–15.0)
Hemoglobin: 8.8 g/dL — ABNORMAL LOW (ref 12.0–15.0)
Potassium: 3.8 mmol/L (ref 3.5–5.1)
Potassium: 3.9 mmol/L (ref 3.5–5.1)
Potassium: 4.1 mmol/L (ref 3.5–5.1)
Potassium: 4.1 mmol/L (ref 3.5–5.1)
Potassium: 4.2 mmol/L (ref 3.5–5.1)
Potassium: 4.3 mmol/L (ref 3.5–5.1)
Potassium: 4.3 mmol/L (ref 3.5–5.1)
Potassium: 4.4 mmol/L (ref 3.5–5.1)
Potassium: 3.8 mmol/L (ref 3.5–5.1)
Potassium: 3.9 mmol/L (ref 3.5–5.1)
Potassium: 3.9 mmol/L (ref 3.5–5.1)
Potassium: 4 mmol/L (ref 3.5–5.1)
Potassium: 4 mmol/L (ref 3.5–5.1)
Potassium: 4 mmol/L (ref 3.5–5.1)
Potassium: 4.1 mmol/L (ref 3.5–5.1)
Potassium: 4.1 mmol/L (ref 3.5–5.1)
Potassium: 4.1 mmol/L (ref 3.5–5.1)
Potassium: 4.1 mmol/L (ref 3.5–5.1)
Potassium: 4.1 mmol/L (ref 3.5–5.1)
Potassium: 4.1 mmol/L (ref 3.5–5.1)
Sodium: 145 mmol/L (ref 135–145)
Sodium: 145 mmol/L (ref 135–145)
Sodium: 146 mmol/L — ABNORMAL HIGH (ref 135–145)
Sodium: 146 mmol/L — ABNORMAL HIGH (ref 135–145)
Sodium: 147 mmol/L — ABNORMAL HIGH (ref 135–145)
Sodium: 147 mmol/L — ABNORMAL HIGH (ref 135–145)
Sodium: 147 mmol/L — ABNORMAL HIGH (ref 135–145)
Sodium: 147 mmol/L — ABNORMAL HIGH (ref 135–145)
Sodium: 143 mmol/L (ref 135–145)
Sodium: 143 mmol/L (ref 135–145)
Sodium: 143 mmol/L (ref 135–145)
Sodium: 144 mmol/L (ref 135–145)
Sodium: 145 mmol/L (ref 135–145)
Sodium: 145 mmol/L (ref 135–145)
Sodium: 145 mmol/L (ref 135–145)
Sodium: 145 mmol/L (ref 135–145)
Sodium: 145 mmol/L (ref 135–145)
Sodium: 146 mmol/L — ABNORMAL HIGH (ref 135–145)
Sodium: 146 mmol/L — ABNORMAL HIGH (ref 135–145)
Sodium: 146 mmol/L — ABNORMAL HIGH (ref 135–145)
TCO2: 19 mmol/L — ABNORMAL LOW (ref 22–32)
TCO2: 20 mmol/L — ABNORMAL LOW (ref 22–32)
TCO2: 21 mmol/L — ABNORMAL LOW (ref 22–32)
TCO2: 21 mmol/L — ABNORMAL LOW (ref 22–32)
TCO2: 21 mmol/L — ABNORMAL LOW (ref 22–32)
TCO2: 23 mmol/L (ref 22–32)
TCO2: 24 mmol/L (ref 22–32)
TCO2: 25 mmol/L (ref 22–32)
TCO2: 21 mmol/L — ABNORMAL LOW (ref 22–32)
TCO2: 24 mmol/L (ref 22–32)
TCO2: 24 mmol/L (ref 22–32)
TCO2: 25 mmol/L (ref 22–32)
TCO2: 26 mmol/L (ref 22–32)
TCO2: 26 mmol/L (ref 22–32)
TCO2: 27 mmol/L (ref 22–32)
TCO2: 27 mmol/L (ref 22–32)
TCO2: 27 mmol/L (ref 22–32)
TCO2: 29 mmol/L (ref 22–32)
TCO2: 29 mmol/L (ref 22–32)
TCO2: 31 mmol/L (ref 22–32)

## 2020-06-26 LAB — CBC
HCT: 25.5 % — ABNORMAL LOW (ref 36.0–46.0)
Hemoglobin: 8.4 g/dL — ABNORMAL LOW (ref 12.0–15.0)
MCH: 29 pg (ref 26.0–34.0)
MCHC: 32.9 g/dL (ref 30.0–36.0)
MCV: 87.9 fL (ref 80.0–100.0)
Platelets: 53 10*3/uL — ABNORMAL LOW (ref 150–400)
RBC: 2.9 MIL/uL — ABNORMAL LOW (ref 3.87–5.11)
RDW: 18.1 % — ABNORMAL HIGH (ref 11.5–15.5)
WBC: 8.1 10*3/uL (ref 4.0–10.5)
nRBC: 1.5 % — ABNORMAL HIGH (ref 0.0–0.2)

## 2020-06-26 LAB — RENAL FUNCTION PANEL
Albumin: 1.7 g/dL — ABNORMAL LOW (ref 3.5–5.0)
Albumin: 1.6 g/dL — ABNORMAL LOW (ref 3.5–5.0)
Anion gap: 11 (ref 5–15)
Anion gap: 8 (ref 5–15)
BUN: 75 mg/dL — ABNORMAL HIGH (ref 8–23)
BUN: 61 mg/dL — ABNORMAL HIGH (ref 8–23)
CO2: 26 mmol/L (ref 22–32)
CO2: 32 mmol/L (ref 22–32)
Calcium: 8.5 mg/dL — ABNORMAL LOW (ref 8.9–10.3)
Calcium: 8.7 mg/dL — ABNORMAL LOW (ref 8.9–10.3)
Chloride: 107 mmol/L (ref 98–111)
Chloride: 103 mmol/L (ref 98–111)
Creatinine, Ser: 2.49 mg/dL — ABNORMAL HIGH (ref 0.44–1.00)
Creatinine, Ser: 1.82 mg/dL — ABNORMAL HIGH (ref 0.44–1.00)
GFR, Estimated: 19 mL/min — ABNORMAL LOW (ref >60)
GFR, Estimated: 28 mL/min — ABNORMAL LOW (ref >60)
Glucose, Bld: 275 mg/dL — ABNORMAL HIGH (ref 70–99)
Glucose, Bld: 233 mg/dL — ABNORMAL HIGH (ref 70–99)
Phosphorus: 3 mg/dL (ref 2.5–4.6)
Phosphorus: 2.7 mg/dL (ref 2.5–4.6)
Potassium: 4.3 mmol/L (ref 3.5–5.1)
Potassium: 4.3 mmol/L (ref 3.5–5.1)
Sodium: 144 mmol/L (ref 135–145)
Sodium: 143 mmol/L (ref 135–145)

## 2020-06-26 LAB — CULTURE, RESPIRATORY W GRAM STAIN: Culture: NORMAL

## 2020-06-26 LAB — MAGNESIUM: Magnesium: 2 mg/dL (ref 1.7–2.4)

## 2020-06-26 LAB — GLUCOSE, CAPILLARY
Glucose-Capillary: 165 mg/dL — ABNORMAL HIGH (ref 70–99)
Glucose-Capillary: 227 mg/dL — ABNORMAL HIGH (ref 70–99)
Glucose-Capillary: 241 mg/dL — ABNORMAL HIGH (ref 70–99)
Glucose-Capillary: 248 mg/dL — ABNORMAL HIGH (ref 70–99)
Glucose-Capillary: 256 mg/dL — ABNORMAL HIGH (ref 70–99)
Glucose-Capillary: 259 mg/dL — ABNORMAL HIGH (ref 70–99)
Glucose-Capillary: 277 mg/dL — ABNORMAL HIGH (ref 70–99)

## 2020-06-26 MED ORDER — INSULIN ASPART 100 UNIT/ML ~~LOC~~ SOLN
2.0000 [IU] | SUBCUTANEOUS | Status: DC
  Administered 2020-06-26 – 2020-06-28 (×10): 2 [IU] via SUBCUTANEOUS

## 2020-06-26 MED ORDER — PHENYLEPHRINE HCL-NACL 10-0.9 MG/250ML-% IV SOLN
INTRAVENOUS | Status: AC
  Filled 2020-06-26: qty 250

## 2020-06-26 MED ORDER — PHENYLEPHRINE HCL-NACL 10-0.9 MG/250ML-% IV SOLN
0.0000 ug/min | INTRAVENOUS | Status: DC
  Administered 2020-06-26: 20 ug/min via INTRAVENOUS
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
## 2020-06-26 NOTE — Progress Notes (Signed)
Patient ID: Meghan Ford, female   DOB: 20-Nov-1940, 80 y.o.   MRN: [id number] BP (!) 108/52   Pulse 64   Temp (!) 96.08 F (35.6 C)   Resp 20   Ht 5\' 6"  (1.676 m)   Wt 120 kg   LMP  (LMP Unknown)   SpO2 95%   BMI 42.70 kg/m  Currently on crt. Intubated sedated No new recommendations.
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
## 2020-06-27 ENCOUNTER — Inpatient Hospital Stay (HOSPITAL_COMMUNITY): Payer: No Typology Code available for payment source

## 2020-06-27 LAB — POCT I-STAT, CHEM 8
BUN: 34 mg/dL — ABNORMAL HIGH (ref 8–23)
BUN: 36 mg/dL — ABNORMAL HIGH (ref 8–23)
BUN: 37 mg/dL — ABNORMAL HIGH (ref 8–23)
BUN: 41 mg/dL — ABNORMAL HIGH (ref 8–23)
BUN: 43 mg/dL — ABNORMAL HIGH (ref 8–23)
BUN: 43 mg/dL — ABNORMAL HIGH (ref 8–23)
BUN: 43 mg/dL — ABNORMAL HIGH (ref 8–23)
BUN: 44 mg/dL — ABNORMAL HIGH (ref 8–23)
BUN: 45 mg/dL — ABNORMAL HIGH (ref 8–23)
BUN: 46 mg/dL — ABNORMAL HIGH (ref 8–23)
BUN: 47 mg/dL — ABNORMAL HIGH (ref 8–23)
BUN: 48 mg/dL — ABNORMAL HIGH (ref 8–23)
BUN: 49 mg/dL — ABNORMAL HIGH (ref 8–23)
BUN: 49 mg/dL — ABNORMAL HIGH (ref 8–23)
BUN: 51 mg/dL — ABNORMAL HIGH (ref 8–23)
BUN: 55 mg/dL — ABNORMAL HIGH (ref 8–23)
Calcium, Ion: 0.36 mmol/L — CL (ref 1.15–1.40)
Calcium, Ion: 0.37 mmol/L — CL (ref 1.15–1.40)
Calcium, Ion: 0.37 mmol/L — CL (ref 1.15–1.40)
Calcium, Ion: 0.41 mmol/L — CL (ref 1.15–1.40)
Calcium, Ion: 0.41 mmol/L — CL (ref 1.15–1.40)
Calcium, Ion: 0.44 mmol/L — CL (ref 1.15–1.40)
Calcium, Ion: 0.45 mmol/L — CL (ref 1.15–1.40)
Calcium, Ion: 0.47 mmol/L — CL (ref 1.15–1.40)
Calcium, Ion: 0.5 mmol/L — CL (ref 1.15–1.40)
Calcium, Ion: 1.02 mmol/L — ABNORMAL LOW (ref 1.15–1.40)
Calcium, Ion: 1.03 mmol/L — ABNORMAL LOW (ref 1.15–1.40)
Calcium, Ion: 1.03 mmol/L — ABNORMAL LOW (ref 1.15–1.40)
Calcium, Ion: 1.03 mmol/L — ABNORMAL LOW (ref 1.15–1.40)
Calcium, Ion: 1.04 mmol/L — ABNORMAL LOW (ref 1.15–1.40)
Calcium, Ion: 1.05 mmol/L — ABNORMAL LOW (ref 1.15–1.40)
Calcium, Ion: 1.07 mmol/L — ABNORMAL LOW (ref 1.15–1.40)
Chloride: 91 mmol/L — ABNORMAL LOW (ref 98–111)
Chloride: 91 mmol/L — ABNORMAL LOW (ref 98–111)
Chloride: 91 mmol/L — ABNORMAL LOW (ref 98–111)
Chloride: 91 mmol/L — ABNORMAL LOW (ref 98–111)
Chloride: 93 mmol/L — ABNORMAL LOW (ref 98–111)
Chloride: 93 mmol/L — ABNORMAL LOW (ref 98–111)
Chloride: 93 mmol/L — ABNORMAL LOW (ref 98–111)
Chloride: 94 mmol/L — ABNORMAL LOW (ref 98–111)
Chloride: 94 mmol/L — ABNORMAL LOW (ref 98–111)
Chloride: 94 mmol/L — ABNORMAL LOW (ref 98–111)
Chloride: 94 mmol/L — ABNORMAL LOW (ref 98–111)
Chloride: 94 mmol/L — ABNORMAL LOW (ref 98–111)
Chloride: 94 mmol/L — ABNORMAL LOW (ref 98–111)
Chloride: 96 mmol/L — ABNORMAL LOW (ref 98–111)
Chloride: 96 mmol/L — ABNORMAL LOW (ref 98–111)
Chloride: 97 mmol/L — ABNORMAL LOW (ref 98–111)
Creatinine, Ser: 1 mg/dL (ref 0.44–1.00)
Creatinine, Ser: 1 mg/dL (ref 0.44–1.00)
Creatinine, Ser: 1.1 mg/dL — ABNORMAL HIGH (ref 0.44–1.00)
Creatinine, Ser: 1.1 mg/dL — ABNORMAL HIGH (ref 0.44–1.00)
Creatinine, Ser: 1.2 mg/dL — ABNORMAL HIGH (ref 0.44–1.00)
Creatinine, Ser: 1.2 mg/dL — ABNORMAL HIGH (ref 0.44–1.00)
Creatinine, Ser: 1.2 mg/dL — ABNORMAL HIGH (ref 0.44–1.00)
Creatinine, Ser: 1.3 mg/dL — ABNORMAL HIGH (ref 0.44–1.00)
Creatinine, Ser: 1.4 mg/dL — ABNORMAL HIGH (ref 0.44–1.00)
Creatinine, Ser: 1.4 mg/dL — ABNORMAL HIGH (ref 0.44–1.00)
Creatinine, Ser: 1.4 mg/dL — ABNORMAL HIGH (ref 0.44–1.00)
Creatinine, Ser: 1.5 mg/dL — ABNORMAL HIGH (ref 0.44–1.00)
Creatinine, Ser: 1.5 mg/dL — ABNORMAL HIGH (ref 0.44–1.00)
Creatinine, Ser: 1.6 mg/dL — ABNORMAL HIGH (ref 0.44–1.00)
Creatinine, Ser: 1.7 mg/dL — ABNORMAL HIGH (ref 0.44–1.00)
Creatinine, Ser: 1.7 mg/dL — ABNORMAL HIGH (ref 0.44–1.00)
Glucose, Bld: 219 mg/dL — ABNORMAL HIGH (ref 70–99)
Glucose, Bld: 231 mg/dL — ABNORMAL HIGH (ref 70–99)
Glucose, Bld: 232 mg/dL — ABNORMAL HIGH (ref 70–99)
Glucose, Bld: 244 mg/dL — ABNORMAL HIGH (ref 70–99)
Glucose, Bld: 245 mg/dL — ABNORMAL HIGH (ref 70–99)
Glucose, Bld: 248 mg/dL — ABNORMAL HIGH (ref 70–99)
Glucose, Bld: 251 mg/dL — ABNORMAL HIGH (ref 70–99)
Glucose, Bld: 254 mg/dL — ABNORMAL HIGH (ref 70–99)
Glucose, Bld: 263 mg/dL — ABNORMAL HIGH (ref 70–99)
Glucose, Bld: 265 mg/dL — ABNORMAL HIGH (ref 70–99)
Glucose, Bld: 266 mg/dL — ABNORMAL HIGH (ref 70–99)
Glucose, Bld: 268 mg/dL — ABNORMAL HIGH (ref 70–99)
Glucose, Bld: 273 mg/dL — ABNORMAL HIGH (ref 70–99)
Glucose, Bld: 274 mg/dL — ABNORMAL HIGH (ref 70–99)
Glucose, Bld: 276 mg/dL — ABNORMAL HIGH (ref 70–99)
Glucose, Bld: 283 mg/dL — ABNORMAL HIGH (ref 70–99)
HCT: 21 % — ABNORMAL LOW (ref 36.0–46.0)
HCT: 21 % — ABNORMAL LOW (ref 36.0–46.0)
HCT: 22 % — ABNORMAL LOW (ref 36.0–46.0)
HCT: 23 % — ABNORMAL LOW (ref 36.0–46.0)
HCT: 23 % — ABNORMAL LOW (ref 36.0–46.0)
HCT: 23 % — ABNORMAL LOW (ref 36.0–46.0)
HCT: 23 % — ABNORMAL LOW (ref 36.0–46.0)
HCT: 23 % — ABNORMAL LOW (ref 36.0–46.0)
HCT: 24 % — ABNORMAL LOW (ref 36.0–46.0)
HCT: 24 % — ABNORMAL LOW (ref 36.0–46.0)
HCT: 24 % — ABNORMAL LOW (ref 36.0–46.0)
HCT: 24 % — ABNORMAL LOW (ref 36.0–46.0)
HCT: 25 % — ABNORMAL LOW (ref 36.0–46.0)
HCT: 25 % — ABNORMAL LOW (ref 36.0–46.0)
HCT: 25 % — ABNORMAL LOW (ref 36.0–46.0)
HCT: 26 % — ABNORMAL LOW (ref 36.0–46.0)
Hemoglobin: 7.1 g/dL — ABNORMAL LOW (ref 12.0–15.0)
Hemoglobin: 7.1 g/dL — ABNORMAL LOW (ref 12.0–15.0)
Hemoglobin: 7.5 g/dL — ABNORMAL LOW (ref 12.0–15.0)
Hemoglobin: 7.8 g/dL — ABNORMAL LOW (ref 12.0–15.0)
Hemoglobin: 7.8 g/dL — ABNORMAL LOW (ref 12.0–15.0)
Hemoglobin: 7.8 g/dL — ABNORMAL LOW (ref 12.0–15.0)
Hemoglobin: 7.8 g/dL — ABNORMAL LOW (ref 12.0–15.0)
Hemoglobin: 7.8 g/dL — ABNORMAL LOW (ref 12.0–15.0)
Hemoglobin: 8.2 g/dL — ABNORMAL LOW (ref 12.0–15.0)
Hemoglobin: 8.2 g/dL — ABNORMAL LOW (ref 12.0–15.0)
Hemoglobin: 8.2 g/dL — ABNORMAL LOW (ref 12.0–15.0)
Hemoglobin: 8.2 g/dL — ABNORMAL LOW (ref 12.0–15.0)
Hemoglobin: 8.5 g/dL — ABNORMAL LOW (ref 12.0–15.0)
Hemoglobin: 8.5 g/dL — ABNORMAL LOW (ref 12.0–15.0)
Hemoglobin: 8.5 g/dL — ABNORMAL LOW (ref 12.0–15.0)
Hemoglobin: 8.8 g/dL — ABNORMAL LOW (ref 12.0–15.0)
Potassium: 3.7 mmol/L (ref 3.5–5.1)
Potassium: 3.7 mmol/L (ref 3.5–5.1)
Potassium: 3.8 mmol/L (ref 3.5–5.1)
Potassium: 3.9 mmol/L (ref 3.5–5.1)
Potassium: 3.9 mmol/L (ref 3.5–5.1)
Potassium: 3.9 mmol/L (ref 3.5–5.1)
Potassium: 3.9 mmol/L (ref 3.5–5.1)
Potassium: 3.9 mmol/L (ref 3.5–5.1)
Potassium: 3.9 mmol/L (ref 3.5–5.1)
Potassium: 3.9 mmol/L (ref 3.5–5.1)
Potassium: 4 mmol/L (ref 3.5–5.1)
Potassium: 4 mmol/L (ref 3.5–5.1)
Potassium: 4.1 mmol/L (ref 3.5–5.1)
Potassium: 4.2 mmol/L (ref 3.5–5.1)
Potassium: 4.3 mmol/L (ref 3.5–5.1)
Potassium: 4.4 mmol/L (ref 3.5–5.1)
Sodium: 141 mmol/L (ref 135–145)
Sodium: 141 mmol/L (ref 135–145)
Sodium: 141 mmol/L (ref 135–145)
Sodium: 142 mmol/L (ref 135–145)
Sodium: 142 mmol/L (ref 135–145)
Sodium: 142 mmol/L (ref 135–145)
Sodium: 142 mmol/L (ref 135–145)
Sodium: 142 mmol/L (ref 135–145)
Sodium: 143 mmol/L (ref 135–145)
Sodium: 143 mmol/L (ref 135–145)
Sodium: 143 mmol/L (ref 135–145)
Sodium: 143 mmol/L (ref 135–145)
Sodium: 143 mmol/L (ref 135–145)
Sodium: 143 mmol/L (ref 135–145)
Sodium: 143 mmol/L (ref 135–145)
Sodium: 144 mmol/L (ref 135–145)
TCO2: 31 mmol/L (ref 22–32)
TCO2: 31 mmol/L (ref 22–32)
TCO2: 31 mmol/L (ref 22–32)
TCO2: 31 mmol/L (ref 22–32)
TCO2: 31 mmol/L (ref 22–32)
TCO2: 31 mmol/L (ref 22–32)
TCO2: 32 mmol/L (ref 22–32)
TCO2: 32 mmol/L (ref 22–32)
TCO2: 32 mmol/L (ref 22–32)
TCO2: 33 mmol/L — ABNORMAL HIGH (ref 22–32)
TCO2: 33 mmol/L — ABNORMAL HIGH (ref 22–32)
TCO2: 33 mmol/L — ABNORMAL HIGH (ref 22–32)
TCO2: 33 mmol/L — ABNORMAL HIGH (ref 22–32)
TCO2: 33 mmol/L — ABNORMAL HIGH (ref 22–32)
TCO2: 34 mmol/L — ABNORMAL HIGH (ref 22–32)
TCO2: 35 mmol/L — ABNORMAL HIGH (ref 22–32)

## 2020-06-27 LAB — RENAL FUNCTION PANEL
Albumin: 1.5 g/dL — ABNORMAL LOW (ref 3.5–5.0)
Albumin: 1.5 g/dL — ABNORMAL LOW (ref 3.5–5.0)
Anion gap: 8 (ref 5–15)
Anion gap: 8 (ref 5–15)
BUN: 54 mg/dL — ABNORMAL HIGH (ref 8–23)
BUN: 43 mg/dL — ABNORMAL HIGH (ref 8–23)
CO2: 35 mmol/L — ABNORMAL HIGH (ref 22–32)
CO2: 38 mmol/L — ABNORMAL HIGH (ref 22–32)
Calcium: 8.8 mg/dL — ABNORMAL LOW (ref 8.9–10.3)
Calcium: 8.7 mg/dL — ABNORMAL LOW (ref 8.9–10.3)
Chloride: 98 mmol/L (ref 98–111)
Chloride: 94 mmol/L — ABNORMAL LOW (ref 98–111)
Creatinine, Ser: 1.54 mg/dL — ABNORMAL HIGH (ref 0.44–1.00)
Creatinine, Ser: 1.33 mg/dL — ABNORMAL HIGH (ref 0.44–1.00)
GFR, Estimated: 35 mL/min — ABNORMAL LOW (ref >60)
GFR, Estimated: 41 mL/min — ABNORMAL LOW (ref >60)
Glucose, Bld: 243 mg/dL — ABNORMAL HIGH (ref 70–99)
Glucose, Bld: 242 mg/dL — ABNORMAL HIGH (ref 70–99)
Phosphorus: 1.3 mg/dL — ABNORMAL LOW (ref 2.5–4.6)
Phosphorus: 2.5 mg/dL (ref 2.5–4.6)
Potassium: 3.9 mmol/L (ref 3.5–5.1)
Potassium: 4.4 mmol/L (ref 3.5–5.1)
Sodium: 141 mmol/L (ref 135–145)
Sodium: 140 mmol/L (ref 135–145)

## 2020-06-27 LAB — CBC
HCT: 24.6 % — ABNORMAL LOW (ref 36.0–46.0)
Hemoglobin: 8 g/dL — ABNORMAL LOW (ref 12.0–15.0)
MCH: 28.6 pg (ref 26.0–34.0)
MCHC: 32.5 g/dL (ref 30.0–36.0)
MCV: 87.9 fL (ref 80.0–100.0)
Platelets: 46 10*3/uL — ABNORMAL LOW (ref 150–400)
RBC: 2.8 MIL/uL — ABNORMAL LOW (ref 3.87–5.11)
RDW: 17.6 % — ABNORMAL HIGH (ref 11.5–15.5)
WBC: 13.7 10*3/uL — ABNORMAL HIGH (ref 4.0–10.5)
nRBC: 2.2 % — ABNORMAL HIGH (ref 0.0–0.2)

## 2020-06-27 LAB — CALCIUM, IONIZED
Calcium, Ionized, Serum: 4.4 mg/dL — ABNORMAL LOW (ref 4.5–5.6)
Calcium, Ionized, Serum: 4.4 mg/dL — ABNORMAL LOW (ref 4.5–5.6)

## 2020-06-27 LAB — GLUCOSE, CAPILLARY
Glucose-Capillary: 245 mg/dL — ABNORMAL HIGH (ref 70–99)
Glucose-Capillary: 254 mg/dL — ABNORMAL HIGH (ref 70–99)
Glucose-Capillary: 274 mg/dL — ABNORMAL HIGH (ref 70–99)
Glucose-Capillary: 263 mg/dL — ABNORMAL HIGH (ref 70–99)
Glucose-Capillary: 222 mg/dL — ABNORMAL HIGH (ref 70–99)
Glucose-Capillary: 265 mg/dL — ABNORMAL HIGH (ref 70–99)

## 2020-06-27 LAB — HEMOGLOBIN AND HEMATOCRIT, BLOOD
HCT: 24.4 % — ABNORMAL LOW (ref 36.0–46.0)
Hemoglobin: 7.8 g/dL — ABNORMAL LOW (ref 12.0–15.0)

## 2020-06-27 LAB — MAGNESIUM: Magnesium: 1.9 mg/dL (ref 1.7–2.4)

## 2020-06-27 MED ORDER — METHOCARBAMOL 500 MG PO TABS
1000.0000 mg | ORAL_TABLET | Freq: Three times a day (TID) | ORAL | Status: DC
  Administered 2020-06-27 – 2020-06-30 (×8): 1000 mg
  Filled 2020-06-27 (×9): qty 2

## 2020-06-27 MED ORDER — POTASSIUM PHOSPHATES 15 MMOLE/5ML IV SOLN
30.0000 mmol | Freq: Once | INTRAVENOUS | Status: AC
  Administered 2020-06-27: 30 mmol via INTRAVENOUS
  Filled 2020-06-27: qty 10

## 2020-06-27 MED ORDER — ACETAMINOPHEN 500 MG PO TABS
1000.0000 mg | ORAL_TABLET | Freq: Four times a day (QID) | ORAL | Status: DC
  Administered 2020-06-27 – 2020-06-30 (×11): 1000 mg
  Filled 2020-06-27 (×11): qty 2

## 2020-06-27 MED ORDER — NOREPINEPHRINE 4 MG/250ML-% IV SOLN
0.0000 ug/min | INTRAVENOUS | Status: DC
  Administered 2020-06-27 – 2020-06-29 (×2): 2 ug/min via INTRAVENOUS
  Filled 2020-06-27 (×2): qty 250
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
## 2020-06-27 NOTE — Progress Notes (Signed)
Patient ID: Meghan Ford, female   DOB: Mar 25, 1940, 80 y.o.   MRN: [id number] BP 131/65   Pulse 65   Temp (!) 96.44 F (35.8 C)   Resp (!) 33   Ht 5\' 6"  (1.676 m)   Wt 120.9 kg   LMP  (LMP Unknown)   SpO2 97%   BMI 43.02 kg/m  Sedated on CRT Wound is clean Removed jp drain today No changes

## 2020-06-28 ENCOUNTER — Inpatient Hospital Stay (HOSPITAL_COMMUNITY): Payer: No Typology Code available for payment source

## 2020-06-28 ENCOUNTER — Inpatient Hospital Stay (HOSPITAL_COMMUNITY): Payer: No Typology Code available for payment source | Admitting: Certified Registered"

## 2020-06-28 ENCOUNTER — Encounter (HOSPITAL_COMMUNITY): Admission: EM | Disposition: E | Payer: Self-pay | Source: Home / Self Care

## 2020-06-28 DIAGNOSIS — S22059A Unspecified fracture of T5-T6 vertebra, initial encounter for closed fracture: Secondary | ICD-10-CM

## 2020-06-28 DIAGNOSIS — N179 Acute kidney failure, unspecified: Secondary | ICD-10-CM

## 2020-06-28 DIAGNOSIS — S2243XA Multiple fractures of ribs, bilateral, initial encounter for closed fracture: Secondary | ICD-10-CM

## 2020-06-28 HISTORY — PX: ORIF PELVIC FRACTURE WITH PERCUTANEOUS SCREWS: SHX6800

## 2020-06-28 LAB — POCT I-STAT, CHEM 8
BUN: 28 mg/dL — ABNORMAL HIGH (ref 8–23)
BUN: 30 mg/dL — ABNORMAL HIGH (ref 8–23)
BUN: 31 mg/dL — ABNORMAL HIGH (ref 8–23)
BUN: 32 mg/dL — ABNORMAL HIGH (ref 8–23)
BUN: 33 mg/dL — ABNORMAL HIGH (ref 8–23)
BUN: 35 mg/dL — ABNORMAL HIGH (ref 8–23)
BUN: 36 mg/dL — ABNORMAL HIGH (ref 8–23)
BUN: 37 mg/dL — ABNORMAL HIGH (ref 8–23)
BUN: 37 mg/dL — ABNORMAL HIGH (ref 8–23)
BUN: 40 mg/dL — ABNORMAL HIGH (ref 8–23)
BUN: 28 mg/dL — ABNORMAL HIGH (ref 8–23)
BUN: 30 mg/dL — ABNORMAL HIGH (ref 8–23)
BUN: 32 mg/dL — ABNORMAL HIGH (ref 8–23)
BUN: 33 mg/dL — ABNORMAL HIGH (ref 8–23)
BUN: 48 mg/dL — ABNORMAL HIGH (ref 8–23)
BUN: 53 mg/dL — ABNORMAL HIGH (ref 8–23)
BUN: 30 mg/dL — ABNORMAL HIGH (ref 8–23)
Calcium, Ion: 0.34 mmol/L — CL (ref 1.15–1.40)
Calcium, Ion: 0.35 mmol/L — CL (ref 1.15–1.40)
Calcium, Ion: 0.36 mmol/L — CL (ref 1.15–1.40)
Calcium, Ion: 0.37 mmol/L — CL (ref 1.15–1.40)
Calcium, Ion: 0.42 mmol/L — CL (ref 1.15–1.40)
Calcium, Ion: 0.99 mmol/L — ABNORMAL LOW (ref 1.15–1.40)
Calcium, Ion: 1 mmol/L — ABNORMAL LOW (ref 1.15–1.40)
Calcium, Ion: 1 mmol/L — ABNORMAL LOW (ref 1.15–1.40)
Calcium, Ion: 1 mmol/L — ABNORMAL LOW (ref 1.15–1.40)
Calcium, Ion: 1.01 mmol/L — ABNORMAL LOW (ref 1.15–1.40)
Calcium, Ion: 0.36 mmol/L — CL (ref 1.15–1.40)
Calcium, Ion: 0.37 mmol/L — CL (ref 1.15–1.40)
Calcium, Ion: 0.95 mmol/L — ABNORMAL LOW (ref 1.15–1.40)
Calcium, Ion: 0.98 mmol/L — ABNORMAL LOW (ref 1.15–1.40)
Calcium, Ion: 0.42 mmol/L — CL (ref 1.15–1.40)
Calcium, Ion: 1.05 mmol/L — ABNORMAL LOW (ref 1.15–1.40)
Calcium, Ion: 1.2 mmol/L (ref 1.15–1.40)
Chloride: 89 mmol/L — ABNORMAL LOW (ref 98–111)
Chloride: 89 mmol/L — ABNORMAL LOW (ref 98–111)
Chloride: 89 mmol/L — ABNORMAL LOW (ref 98–111)
Chloride: 90 mmol/L — ABNORMAL LOW (ref 98–111)
Chloride: 90 mmol/L — ABNORMAL LOW (ref 98–111)
Chloride: 91 mmol/L — ABNORMAL LOW (ref 98–111)
Chloride: 92 mmol/L — ABNORMAL LOW (ref 98–111)
Chloride: 92 mmol/L — ABNORMAL LOW (ref 98–111)
Chloride: 92 mmol/L — ABNORMAL LOW (ref 98–111)
Chloride: 95 mmol/L — ABNORMAL LOW (ref 98–111)
Chloride: 88 mmol/L — ABNORMAL LOW (ref 98–111)
Chloride: 90 mmol/L — ABNORMAL LOW (ref 98–111)
Chloride: 93 mmol/L — ABNORMAL LOW (ref 98–111)
Chloride: 94 mmol/L — ABNORMAL LOW (ref 98–111)
Chloride: 101 mmol/L (ref 98–111)
Chloride: 96 mmol/L — ABNORMAL LOW (ref 98–111)
Chloride: 96 mmol/L — ABNORMAL LOW (ref 98–111)
Creatinine, Ser: 0.9 mg/dL (ref 0.44–1.00)
Creatinine, Ser: 0.9 mg/dL (ref 0.44–1.00)
Creatinine, Ser: 0.9 mg/dL (ref 0.44–1.00)
Creatinine, Ser: 0.9 mg/dL (ref 0.44–1.00)
Creatinine, Ser: 0.9 mg/dL (ref 0.44–1.00)
Creatinine, Ser: 1.2 mg/dL — ABNORMAL HIGH (ref 0.44–1.00)
Creatinine, Ser: 1.2 mg/dL — ABNORMAL HIGH (ref 0.44–1.00)
Creatinine, Ser: 1.2 mg/dL — ABNORMAL HIGH (ref 0.44–1.00)
Creatinine, Ser: 1.2 mg/dL — ABNORMAL HIGH (ref 0.44–1.00)
Creatinine, Ser: 1.3 mg/dL — ABNORMAL HIGH (ref 0.44–1.00)
Creatinine, Ser: 0.8 mg/dL (ref 0.44–1.00)
Creatinine, Ser: 0.8 mg/dL (ref 0.44–1.00)
Creatinine, Ser: 1 mg/dL (ref 0.44–1.00)
Creatinine, Ser: 1 mg/dL (ref 0.44–1.00)
Creatinine, Ser: 1.5 mg/dL — ABNORMAL HIGH (ref 0.44–1.00)
Creatinine, Ser: 1.8 mg/dL — ABNORMAL HIGH (ref 0.44–1.00)
Creatinine, Ser: 1.1 mg/dL — ABNORMAL HIGH (ref 0.44–1.00)
Glucose, Bld: 220 mg/dL — ABNORMAL HIGH (ref 70–99)
Glucose, Bld: 234 mg/dL — ABNORMAL HIGH (ref 70–99)
Glucose, Bld: 247 mg/dL — ABNORMAL HIGH (ref 70–99)
Glucose, Bld: 261 mg/dL — ABNORMAL HIGH (ref 70–99)
Glucose, Bld: 266 mg/dL — ABNORMAL HIGH (ref 70–99)
Glucose, Bld: 275 mg/dL — ABNORMAL HIGH (ref 70–99)
Glucose, Bld: 277 mg/dL — ABNORMAL HIGH (ref 70–99)
Glucose, Bld: 283 mg/dL — ABNORMAL HIGH (ref 70–99)
Glucose, Bld: 298 mg/dL — ABNORMAL HIGH (ref 70–99)
Glucose, Bld: 302 mg/dL — ABNORMAL HIGH (ref 70–99)
Glucose, Bld: 249 mg/dL — ABNORMAL HIGH (ref 70–99)
Glucose, Bld: 264 mg/dL — ABNORMAL HIGH (ref 70–99)
Glucose, Bld: 288 mg/dL — ABNORMAL HIGH (ref 70–99)
Glucose, Bld: 300 mg/dL — ABNORMAL HIGH (ref 70–99)
Glucose, Bld: 214 mg/dL — ABNORMAL HIGH (ref 70–99)
Glucose, Bld: 248 mg/dL — ABNORMAL HIGH (ref 70–99)
Glucose, Bld: 130 mg/dL — ABNORMAL HIGH (ref 70–99)
HCT: 20 % — ABNORMAL LOW (ref 36.0–46.0)
HCT: 20 % — ABNORMAL LOW (ref 36.0–46.0)
HCT: 23 % — ABNORMAL LOW (ref 36.0–46.0)
HCT: 23 % — ABNORMAL LOW (ref 36.0–46.0)
HCT: 23 % — ABNORMAL LOW (ref 36.0–46.0)
HCT: 24 % — ABNORMAL LOW (ref 36.0–46.0)
HCT: 25 % — ABNORMAL LOW (ref 36.0–46.0)
HCT: 26 % — ABNORMAL LOW (ref 36.0–46.0)
HCT: 27 % — ABNORMAL LOW (ref 36.0–46.0)
HCT: 29 % — ABNORMAL LOW (ref 36.0–46.0)
HCT: 22 % — ABNORMAL LOW (ref 36.0–46.0)
HCT: 23 % — ABNORMAL LOW (ref 36.0–46.0)
HCT: 28 % — ABNORMAL LOW (ref 36.0–46.0)
HCT: 28 % — ABNORMAL LOW (ref 36.0–46.0)
HCT: 23 % — ABNORMAL LOW (ref 36.0–46.0)
HCT: 25 % — ABNORMAL LOW (ref 36.0–46.0)
HCT: 23 % — ABNORMAL LOW (ref 36.0–46.0)
Hemoglobin: 6.8 g/dL — CL (ref 12.0–15.0)
Hemoglobin: 6.8 g/dL — CL (ref 12.0–15.0)
Hemoglobin: 7.8 g/dL — ABNORMAL LOW (ref 12.0–15.0)
Hemoglobin: 7.8 g/dL — ABNORMAL LOW (ref 12.0–15.0)
Hemoglobin: 7.8 g/dL — ABNORMAL LOW (ref 12.0–15.0)
Hemoglobin: 8.2 g/dL — ABNORMAL LOW (ref 12.0–15.0)
Hemoglobin: 8.5 g/dL — ABNORMAL LOW (ref 12.0–15.0)
Hemoglobin: 8.8 g/dL — ABNORMAL LOW (ref 12.0–15.0)
Hemoglobin: 9.2 g/dL — ABNORMAL LOW (ref 12.0–15.0)
Hemoglobin: 9.9 g/dL — ABNORMAL LOW (ref 12.0–15.0)
Hemoglobin: 7.5 g/dL — ABNORMAL LOW (ref 12.0–15.0)
Hemoglobin: 7.8 g/dL — ABNORMAL LOW (ref 12.0–15.0)
Hemoglobin: 9.5 g/dL — ABNORMAL LOW (ref 12.0–15.0)
Hemoglobin: 9.5 g/dL — ABNORMAL LOW (ref 12.0–15.0)
Hemoglobin: 7.8 g/dL — ABNORMAL LOW (ref 12.0–15.0)
Hemoglobin: 8.5 g/dL — ABNORMAL LOW (ref 12.0–15.0)
Hemoglobin: 7.8 g/dL — ABNORMAL LOW (ref 12.0–15.0)
Potassium: 3.9 mmol/L (ref 3.5–5.1)
Potassium: 4.1 mmol/L (ref 3.5–5.1)
Potassium: 4.1 mmol/L (ref 3.5–5.1)
Potassium: 4.2 mmol/L (ref 3.5–5.1)
Potassium: 4.2 mmol/L (ref 3.5–5.1)
Potassium: 4.3 mmol/L (ref 3.5–5.1)
Potassium: 4.3 mmol/L (ref 3.5–5.1)
Potassium: 4.3 mmol/L (ref 3.5–5.1)
Potassium: 4.5 mmol/L (ref 3.5–5.1)
Potassium: 4.5 mmol/L (ref 3.5–5.1)
Potassium: 3.9 mmol/L (ref 3.5–5.1)
Potassium: 4 mmol/L (ref 3.5–5.1)
Potassium: 4.2 mmol/L (ref 3.5–5.1)
Potassium: 4.2 mmol/L (ref 3.5–5.1)
Potassium: 4 mmol/L (ref 3.5–5.1)
Potassium: 4.1 mmol/L (ref 3.5–5.1)
Potassium: 4 mmol/L (ref 3.5–5.1)
Sodium: 138 mmol/L (ref 135–145)
Sodium: 139 mmol/L (ref 135–145)
Sodium: 140 mmol/L (ref 135–145)
Sodium: 140 mmol/L (ref 135–145)
Sodium: 140 mmol/L (ref 135–145)
Sodium: 140 mmol/L (ref 135–145)
Sodium: 141 mmol/L (ref 135–145)
Sodium: 141 mmol/L (ref 135–145)
Sodium: 142 mmol/L (ref 135–145)
Sodium: 142 mmol/L (ref 135–145)
Sodium: 140 mmol/L (ref 135–145)
Sodium: 140 mmol/L (ref 135–145)
Sodium: 141 mmol/L (ref 135–145)
Sodium: 141 mmol/L (ref 135–145)
Sodium: 144 mmol/L (ref 135–145)
Sodium: 145 mmol/L (ref 135–145)
Sodium: 142 mmol/L (ref 135–145)
TCO2: 31 mmol/L (ref 22–32)
TCO2: 33 mmol/L — ABNORMAL HIGH (ref 22–32)
TCO2: 33 mmol/L — ABNORMAL HIGH (ref 22–32)
TCO2: 33 mmol/L — ABNORMAL HIGH (ref 22–32)
TCO2: 33 mmol/L — ABNORMAL HIGH (ref 22–32)
TCO2: 33 mmol/L — ABNORMAL HIGH (ref 22–32)
TCO2: 34 mmol/L — ABNORMAL HIGH (ref 22–32)
TCO2: 34 mmol/L — ABNORMAL HIGH (ref 22–32)
TCO2: 34 mmol/L — ABNORMAL HIGH (ref 22–32)
TCO2: 35 mmol/L — ABNORMAL HIGH (ref 22–32)
TCO2: 32 mmol/L (ref 22–32)
TCO2: 33 mmol/L — ABNORMAL HIGH (ref 22–32)
TCO2: 33 mmol/L — ABNORMAL HIGH (ref 22–32)
TCO2: 33 mmol/L — ABNORMAL HIGH (ref 22–32)
TCO2: 29 mmol/L (ref 22–32)
TCO2: 31 mmol/L (ref 22–32)
TCO2: 37 mmol/L — ABNORMAL HIGH (ref 22–32)

## 2020-06-28 LAB — RENAL FUNCTION PANEL
Albumin: 1.6 g/dL — ABNORMAL LOW (ref 3.5–5.0)
Albumin: 1.6 g/dL — ABNORMAL LOW (ref 3.5–5.0)
Anion gap: 11 (ref 5–15)
Anion gap: 9 (ref 5–15)
BUN: 37 mg/dL — ABNORMAL HIGH (ref 8–23)
BUN: 33 mg/dL — ABNORMAL HIGH (ref 8–23)
CO2: 37 mmol/L — ABNORMAL HIGH (ref 22–32)
CO2: 41 mmol/L — ABNORMAL HIGH (ref 22–32)
Calcium: 9 mg/dL (ref 8.9–10.3)
Calcium: 9.8 mg/dL (ref 8.9–10.3)
Chloride: 92 mmol/L — ABNORMAL LOW (ref 98–111)
Chloride: 91 mmol/L — ABNORMAL LOW (ref 98–111)
Creatinine, Ser: 1.18 mg/dL — ABNORMAL HIGH (ref 0.44–1.00)
Creatinine, Ser: 1.19 mg/dL — ABNORMAL HIGH (ref 0.44–1.00)
GFR, Estimated: 48 mL/min — ABNORMAL LOW (ref >60)
GFR, Estimated: 47 mL/min — ABNORMAL LOW (ref >60)
Glucose, Bld: 278 mg/dL — ABNORMAL HIGH (ref 70–99)
Glucose, Bld: 158 mg/dL — ABNORMAL HIGH (ref 70–99)
Phosphorus: 2.5 mg/dL (ref 2.5–4.6)
Phosphorus: 2.9 mg/dL (ref 2.5–4.6)
Potassium: 4.4 mmol/L (ref 3.5–5.1)
Potassium: 4.4 mmol/L (ref 3.5–5.1)
Sodium: 140 mmol/L (ref 135–145)
Sodium: 141 mmol/L (ref 135–145)

## 2020-06-28 LAB — MAGNESIUM: Magnesium: 2.1 mg/dL (ref 1.7–2.4)

## 2020-06-28 LAB — CBC
HCT: 26.2 % — ABNORMAL LOW (ref 36.0–46.0)
Hemoglobin: 8.3 g/dL — ABNORMAL LOW (ref 12.0–15.0)
MCH: 28.4 pg (ref 26.0–34.0)
MCHC: 31.7 g/dL (ref 30.0–36.0)
MCV: 89.7 fL (ref 80.0–100.0)
Platelets: 51 10*3/uL — ABNORMAL LOW (ref 150–400)
RBC: 2.92 MIL/uL — ABNORMAL LOW (ref 3.87–5.11)
RDW: 17.8 % — ABNORMAL HIGH (ref 11.5–15.5)
WBC: 18.8 10*3/uL — ABNORMAL HIGH (ref 4.0–10.5)
nRBC: 2.9 % — ABNORMAL HIGH (ref 0.0–0.2)

## 2020-06-28 LAB — POCT I-STAT 7, (LYTES, BLD GAS, ICA,H+H)
Acid-Base Excess: 17 mmol/L — ABNORMAL HIGH (ref 0.0–2.0)
Acid-Base Excess: 19 mmol/L — ABNORMAL HIGH (ref 0.0–2.0)
Bicarbonate: 43.2 mmol/L — ABNORMAL HIGH (ref 20.0–28.0)
Bicarbonate: 42.3 mmol/L — ABNORMAL HIGH (ref 20.0–28.0)
Calcium, Ion: 1.25 mmol/L (ref 1.15–1.40)
Calcium, Ion: 1.17 mmol/L (ref 1.15–1.40)
HCT: 26 % — ABNORMAL LOW (ref 36.0–46.0)
HCT: 27 % — ABNORMAL LOW (ref 36.0–46.0)
Hemoglobin: 8.8 g/dL — ABNORMAL LOW (ref 12.0–15.0)
Hemoglobin: 9.2 g/dL — ABNORMAL LOW (ref 12.0–15.0)
O2 Saturation: 94 %
O2 Saturation: 97 %
Patient temperature: 98.9
Potassium: 4.5 mmol/L (ref 3.5–5.1)
Potassium: 4.8 mmol/L (ref 3.5–5.1)
Sodium: 138 mmol/L (ref 135–145)
Sodium: 138 mmol/L (ref 135–145)
TCO2: 45 mmol/L — ABNORMAL HIGH (ref 22–32)
TCO2: 43 mmol/L — ABNORMAL HIGH (ref 22–32)
pCO2 arterial: 61.6 mmHg — ABNORMAL HIGH (ref 32.0–48.0)
pCO2 arterial: 40.8 mmHg (ref 32.0–48.0)
pH, Arterial: 7.453 — ABNORMAL HIGH (ref 7.350–7.450)
pH, Arterial: 7.624 (ref 7.350–7.450)
pO2, Arterial: 69 mmHg — ABNORMAL LOW (ref 83.0–108.0)
pO2, Arterial: 76 mmHg — ABNORMAL LOW (ref 83.0–108.0)

## 2020-06-28 LAB — CALCIUM, IONIZED: Calcium, Ionized, Serum: 4.7 mg/dL (ref 4.5–5.6)

## 2020-06-28 LAB — GLUCOSE, CAPILLARY
Glucose-Capillary: 265 mg/dL — ABNORMAL HIGH (ref 70–99)
Glucose-Capillary: 263 mg/dL — ABNORMAL HIGH (ref 70–99)
Glucose-Capillary: 234 mg/dL — ABNORMAL HIGH (ref 70–99)
Glucose-Capillary: 150 mg/dL — ABNORMAL HIGH (ref 70–99)

## 2020-06-28 LAB — SURGICAL PCR SCREEN
MRSA, PCR: NEGATIVE
Staphylococcus aureus: POSITIVE — AB

## 2020-06-28 LAB — PREPARE RBC (CROSSMATCH)

## 2020-06-28 SURGERY — CLOSED REDUCTION, PELVIS, WITH PERCUTANEOUS FIXATION
Anesthesia: General | Site: Pelvis | Laterality: Bilateral

## 2020-06-28 MED ORDER — PROPOFOL 10 MG/ML IV BOLUS
INTRAVENOUS | Status: DC | PRN
  Administered 2020-06-28: 30 mg via INTRAVENOUS

## 2020-06-28 MED ORDER — SODIUM CHLORIDE 0.9% IV SOLUTION: Freq: Once | INTRAVENOUS | Status: AC

## 2020-06-28 MED ORDER — INSULIN ASPART 100 UNIT/ML ~~LOC~~ SOLN
6.0000 [IU] | SUBCUTANEOUS | Status: DC
  Administered 2020-06-28 – 2020-06-29 (×4): 6 [IU] via SUBCUTANEOUS

## 2020-06-28 MED ORDER — ACD FORMULA A 0.73-2.45-2.2 GM/100ML VI SOLN
3000.0000 mL | Status: DC
  Administered 2020-06-28 – 2020-06-29 (×2): 3000 mL via INTRAVENOUS_CENTRAL
  Filled 2020-06-28 (×5): qty 3000

## 2020-06-28 MED ORDER — PROPOFOL 10 MG/ML IV BOLUS
INTRAVENOUS | Status: AC
  Filled 2020-06-28: qty 20

## 2020-06-28 MED ORDER — CEFAZOLIN SODIUM-DEXTROSE 2-4 GM/100ML-% IV SOLN
2.0000 g | Freq: Three times a day (TID) | INTRAVENOUS | Status: AC
  Administered 2020-06-28 – 2020-06-29 (×3): 2 g via INTRAVENOUS
  Filled 2020-06-28 (×3): qty 100

## 2020-06-28 MED ORDER — FENTANYL CITRATE (PF) 100 MCG/2ML IJ SOLN
INTRAMUSCULAR | Status: DC | PRN
  Administered 2020-06-28: 50 ug via INTRAVENOUS

## 2020-06-28 MED ORDER — CEFAZOLIN SODIUM-DEXTROSE 2-3 GM-%(50ML) IV SOLR
INTRAVENOUS | Status: DC | PRN
  Administered 2020-06-28: 2 g via INTRAVENOUS

## 2020-06-28 MED ORDER — ROCURONIUM BROMIDE 100 MG/10ML IV SOLN
INTRAVENOUS | Status: DC | PRN
  Administered 2020-06-28: 50 mg via INTRAVENOUS

## 2020-06-28 MED ORDER — PHENYLEPHRINE HCL (PRESSORS) 10 MG/ML IV SOLN
INTRAVENOUS | Status: DC | PRN
  Administered 2020-06-28: 40 ug via INTRAVENOUS

## 2020-06-28 MED ORDER — 0.9 % SODIUM CHLORIDE (POUR BTL) OPTIME
TOPICAL | Status: DC | PRN
  Administered 2020-06-28: 1000 mL

## 2020-06-28 MED ORDER — ALBUMIN HUMAN 5 % IV SOLN: INTRAVENOUS | Status: DC | PRN

## 2020-06-28 MED ORDER — ACD FORMULA A 0.73-2.45-2.2 GM/100ML VI SOLN
1000.0000 mL | Status: DC
  Administered 2020-06-28: 1000 mL via INTRAVENOUS_CENTRAL
  Filled 2020-06-28: qty 1000

## 2020-06-28 MED ORDER — MUPIROCIN 2 % EX OINT
1.0000 "application " | TOPICAL_OINTMENT | Freq: Two times a day (BID) | CUTANEOUS | Status: DC
  Administered 2020-06-28 – 2020-06-30 (×5): 1 via NASAL

## 2020-06-28 MED FILL — Sodium Chloride IV Soln 0.9%: INTRAVENOUS | Qty: 2000 | Status: AC

## 2020-06-28 MED FILL — Sodium Chloride Irrigation Soln 0.9%: Qty: 3000 | Status: AC

## 2020-06-28 SURGICAL SUPPLY — 48 items
ADH SKN CLS APL DERMABOND .7 (GAUZE/BANDAGES/DRESSINGS) ×1
APL PRP STRL LF DISP 70% ISPRP (MISCELLANEOUS) ×1
BIT DRILL CANN 4.5MM (BIT) IMPLANT
BLADE CLIPPER SURG (BLADE) IMPLANT
BLADE SURG 11 STRL SS (BLADE) ×2 IMPLANT
CHLORAPREP W/TINT 26 (MISCELLANEOUS) ×2 IMPLANT
COVER WAND RF STERILE (DRAPES) ×2 IMPLANT
DERMABOND ADVANCED (GAUZE/BANDAGES/DRESSINGS) ×1
DERMABOND ADVANCED .7 DNX12 (GAUZE/BANDAGES/DRESSINGS) IMPLANT
DRAPE C-ARM 42X72 X-RAY (DRAPES) ×2 IMPLANT
DRAPE C-ARMOR (DRAPES) ×1 IMPLANT
DRAPE HALF SHEET 40X57 (DRAPES) ×2 IMPLANT
DRAPE INCISE IOBAN 66X45 STRL (DRAPES) ×2 IMPLANT
DRAPE SURG 17X23 STRL (DRAPES) ×12 IMPLANT
DRAPE U-SHAPE 47X51 STRL (DRAPES) ×1 IMPLANT
DRESSING MEPILEX FLEX 4X4 (GAUZE/BANDAGES/DRESSINGS) IMPLANT
DRILL BIT CANN 4.5MM (BIT) ×1
DRSG MEPILEX BORDER 4X4 (GAUZE/BANDAGES/DRESSINGS) IMPLANT
DRSG MEPILEX BORDER 4X8 (GAUZE/BANDAGES/DRESSINGS) IMPLANT
DRSG MEPILEX FLEX 4X4 (GAUZE/BANDAGES/DRESSINGS) ×2
ELECT REM PT RETURN 9FT ADLT (ELECTROSURGICAL) ×2
ELECTRODE REM PT RTRN 9FT ADLT (ELECTROSURGICAL) ×1 IMPLANT
GLOVE BIO SURGEON STRL SZ 6.5 (GLOVE) ×6 IMPLANT
GLOVE BIO SURGEON STRL SZ7.5 (GLOVE) ×8 IMPLANT
GLOVE BIOGEL PI IND STRL 7.5 (GLOVE) ×1 IMPLANT
GLOVE BIOGEL PI INDICATOR 7.5 (GLOVE) ×1
GLOVE SURG UNDER POLY LF SZ6.5 (GLOVE) ×2 IMPLANT
GOWN STRL REUS W/ TWL LRG LVL3 (GOWN DISPOSABLE) ×2 IMPLANT
GOWN STRL REUS W/TWL LRG LVL3 (GOWN DISPOSABLE) ×6
GUIDEWIRE 2.0MM (WIRE) ×2 IMPLANT
GUIDEWIRE THREADED 2.8MM (WIRE) ×2 IMPLANT
KIT BASIN OR (CUSTOM PROCEDURE TRAY) ×2 IMPLANT
KIT TURNOVER KIT B (KITS) ×2 IMPLANT
MANIFOLD NEPTUNE II (INSTRUMENTS) ×2 IMPLANT
NS IRRIG 1000ML POUR BTL (IV SOLUTION) ×2 IMPLANT
PACK TOTAL JOINT (CUSTOM PROCEDURE TRAY) ×2 IMPLANT
PACK UNIVERSAL I (CUSTOM PROCEDURE TRAY) ×2 IMPLANT
PAD ARMBOARD 7.5X6 YLW CONV (MISCELLANEOUS) ×4 IMPLANT
SCREW BONE CANN 7.3X145MM F/TH (Screw) ×1 IMPLANT
SPONGE LAP 18X18 RF (DISPOSABLE) IMPLANT
STAPLER VISISTAT 35W (STAPLE) ×2 IMPLANT
SUCTION FRAZIER HANDLE 10FR (MISCELLANEOUS) ×2
SUCTION TUBE FRAZIER 10FR DISP (MISCELLANEOUS) ×1 IMPLANT
SUT MNCRL AB 3-0 PS2 18 (SUTURE) ×2 IMPLANT
SUT MON AB 2-0 CT1 36 (SUTURE) ×2 IMPLANT
TRAY FOLEY MTR SLVR 16FR STAT (SET/KITS/TRAYS/PACK) IMPLANT
WASHER FOR 5.0 SCREWS (Washer) ×1 IMPLANT
WATER STERILE IRR 1000ML POUR (IV SOLUTION) ×1 IMPLANT
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
## 2020-06-28 NOTE — Progress Notes (Signed)
Transported patient to CT and back to [id number] without event.

## 2020-06-28 NOTE — Anesthesia Postprocedure Evaluation (Signed)
Anesthesia Post Note  Patient: Meghan Ford  Procedure(s) Performed: ORIF PELVIC FRACTURE WITH PERCUTANEOUS SCREWS (Bilateral Pelvis)     Patient location during evaluation: SICU Anesthesia Type: General Level of consciousness: sedated Pain management: pain level controlled Vital Signs Assessment: post-procedure vital signs reviewed and stable Respiratory status: patient remains intubated per anesthesia plan Cardiovascular status: stable Postop Assessment: no apparent nausea or vomiting Anesthetic complications: no   No complications documented.  Last Vitals:  Vitals:   06/17/2020 1700 06/29/2020 1706  BP: (!) 122/49   Pulse: 63   Resp: 18   Temp:    SpO2: 97% 98%    Last Pain:  Vitals:   06/29/2020 0800  TempSrc: Rectal                 Cecile Hearing
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
## 2020-06-28 NOTE — Transfer of Care (Signed)
Immediate Anesthesia Transfer of Care Note  Patient: Meghan Ford  Procedure(s) Performed: ORIF PELVIC FRACTURE WITH PERCUTANEOUS SCREWS (Bilateral Pelvis)  Patient Location: PACU  Anesthesia Type:General   Level of Consciousness: sedated and Patient remains intubated per anesthesia plan  Airway & Oxygen Therapy: Patient remains intubated per anesthesia plan and Patient placed on Ventilator (see vital sign flow sheet for setting)  Post-op Assessment: Report given to RN and Post -op Vital signs reviewed and stable  Post vital signs: Reviewed and stable  Last Vitals:  Vitals Value Taken Time  BP 98/39 06/21/2020 1645  Temp    Pulse 67 06/11/2020 1646  Resp 22 06/24/2020 1646  SpO2 86 % 06/19/2020 1646  Vitals shown include unvalidated device data.  Last Pain:  Vitals:   07/10/2020 0800  TempSrc: Rectal         Complications: No complications documented.

## 2020-06-28 NOTE — Anesthesia Preprocedure Evaluation (Addendum)
Anesthesia Evaluation  Patient identified by MRN, date of birth, ID band Patient unresponsive    Reviewed: Allergy & Precautions, NPO status , Patient's Chart, lab work & pertinent test results, Unable to perform ROS - Chart review only  History of Anesthesia Complications Negative for: history of anesthetic complications  Airway Mallampati: Intubated       Dental   Pulmonary neg pulmonary ROS,  Intubated Bilateral rib fx   breath sounds clear to auscultation       Cardiovascular negative cardio ROS Normal cardiovascular exam     Neuro/Psych C2 Fx T5 Fx negative neurological ROS     GI/Hepatic negative GI ROS, Neg liver ROS,   Endo/Other  negative endocrine ROS  Renal/GU Accessory renal artery tear.     Musculoskeletal Pelvic ring fx   Abdominal   Peds  Hematology negative hematology ROS (+)   Anesthesia Other Findings   Reproductive/Obstetrics                            Anesthesia Physical Anesthesia Plan  ASA: IV  Anesthesia Plan: General   Post-op Pain Management:    Induction: Intravenous  PONV Risk Score and Plan: 4 or greater and Ondansetron and Treatment may vary due to age or medical condition  Airway Management Planned: Oral ETT  Additional Equipment:   Intra-op Plan:   Post-operative Plan: Post-operative intubation/ventilation  Informed Consent: I have reviewed the patients History and Physical, chart, labs and discussed the procedure including the risks, benefits and alternatives for the proposed anesthesia with the patient or authorized representative who has indicated his/her understanding and acceptance.       Plan Discussed with: Anesthesiologist, CRNA and Surgeon  Anesthesia Plan Comments:         Anesthesia Quick Evaluation
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
## 2020-06-29 ENCOUNTER — Encounter (HOSPITAL_COMMUNITY): Payer: Self-pay | Admitting: Student

## 2020-06-29 ENCOUNTER — Inpatient Hospital Stay (HOSPITAL_COMMUNITY): Payer: No Typology Code available for payment source

## 2020-06-29 DIAGNOSIS — I6340 Cerebral infarction due to embolism of unspecified cerebral artery: Secondary | ICD-10-CM | POA: Insufficient documentation

## 2020-06-29 DIAGNOSIS — I6389 Other cerebral infarction: Secondary | ICD-10-CM

## 2020-06-29 DIAGNOSIS — D649 Anemia, unspecified: Secondary | ICD-10-CM

## 2020-06-29 DIAGNOSIS — Z8673 Personal history of transient ischemic attack (TIA), and cerebral infarction without residual deficits: Secondary | ICD-10-CM

## 2020-06-29 DIAGNOSIS — D696 Thrombocytopenia, unspecified: Secondary | ICD-10-CM

## 2020-06-29 DIAGNOSIS — Z66 Do not resuscitate: Secondary | ICD-10-CM

## 2020-06-29 LAB — POCT I-STAT, CHEM 8
BUN: 26 mg/dL — ABNORMAL HIGH (ref 8–23)
BUN: 27 mg/dL — ABNORMAL HIGH (ref 8–23)
BUN: 29 mg/dL — ABNORMAL HIGH (ref 8–23)
BUN: 30 mg/dL — ABNORMAL HIGH (ref 8–23)
BUN: 30 mg/dL — ABNORMAL HIGH (ref 8–23)
BUN: 30 mg/dL — ABNORMAL HIGH (ref 8–23)
BUN: 31 mg/dL — ABNORMAL HIGH (ref 8–23)
BUN: 32 mg/dL — ABNORMAL HIGH (ref 8–23)
BUN: 33 mg/dL — ABNORMAL HIGH (ref 8–23)
BUN: 34 mg/dL — ABNORMAL HIGH (ref 8–23)
BUN: 34 mg/dL — ABNORMAL HIGH (ref 8–23)
BUN: 36 mg/dL — ABNORMAL HIGH (ref 8–23)
BUN: 36 mg/dL — ABNORMAL HIGH (ref 8–23)
BUN: 40 mg/dL — ABNORMAL HIGH (ref 8–23)
BUN: 42 mg/dL — ABNORMAL HIGH (ref 8–23)
BUN: 40 mg/dL — ABNORMAL HIGH (ref 8–23)
Calcium, Ion: 0.36 mmol/L — CL (ref 1.15–1.40)
Calcium, Ion: 0.52 mmol/L — CL (ref 1.15–1.40)
Calcium, Ion: 0.53 mmol/L — CL (ref 1.15–1.40)
Calcium, Ion: 0.63 mmol/L — CL (ref 1.15–1.40)
Calcium, Ion: 0.65 mmol/L — CL (ref 1.15–1.40)
Calcium, Ion: 0.72 mmol/L — CL (ref 1.15–1.40)
Calcium, Ion: 0.92 mmol/L — ABNORMAL LOW (ref 1.15–1.40)
Calcium, Ion: 0.98 mmol/L — ABNORMAL LOW (ref 1.15–1.40)
Calcium, Ion: 1.04 mmol/L — ABNORMAL LOW (ref 1.15–1.40)
Calcium, Ion: 1.04 mmol/L — ABNORMAL LOW (ref 1.15–1.40)
Calcium, Ion: 1.08 mmol/L — ABNORMAL LOW (ref 1.15–1.40)
Calcium, Ion: 1.08 mmol/L — ABNORMAL LOW (ref 1.15–1.40)
Calcium, Ion: 1.21 mmol/L (ref 1.15–1.40)
Calcium, Ion: 1.05 mmol/L — ABNORMAL LOW (ref 1.15–1.40)
Calcium, Ion: 1.05 mmol/L — ABNORMAL LOW (ref 1.15–1.40)
Calcium, Ion: <0.3 mmol/L — CL (ref 1.15–1.40)
Chloride: 102 mmol/L (ref 98–111)
Chloride: 89 mmol/L — ABNORMAL LOW (ref 98–111)
Chloride: 90 mmol/L — ABNORMAL LOW (ref 98–111)
Chloride: 90 mmol/L — ABNORMAL LOW (ref 98–111)
Chloride: 90 mmol/L — ABNORMAL LOW (ref 98–111)
Chloride: 90 mmol/L — ABNORMAL LOW (ref 98–111)
Chloride: 91 mmol/L — ABNORMAL LOW (ref 98–111)
Chloride: 91 mmol/L — ABNORMAL LOW (ref 98–111)
Chloride: 91 mmol/L — ABNORMAL LOW (ref 98–111)
Chloride: 91 mmol/L — ABNORMAL LOW (ref 98–111)
Chloride: 92 mmol/L — ABNORMAL LOW (ref 98–111)
Chloride: 92 mmol/L — ABNORMAL LOW (ref 98–111)
Chloride: 93 mmol/L — ABNORMAL LOW (ref 98–111)
Chloride: 92 mmol/L — ABNORMAL LOW (ref 98–111)
Chloride: 94 mmol/L — ABNORMAL LOW (ref 98–111)
Chloride: 92 mmol/L — ABNORMAL LOW (ref 98–111)
Creatinine, Ser: 0.8 mg/dL (ref 0.44–1.00)
Creatinine, Ser: 0.9 mg/dL (ref 0.44–1.00)
Creatinine, Ser: 0.9 mg/dL (ref 0.44–1.00)
Creatinine, Ser: 0.9 mg/dL (ref 0.44–1.00)
Creatinine, Ser: 0.9 mg/dL (ref 0.44–1.00)
Creatinine, Ser: 1 mg/dL (ref 0.44–1.00)
Creatinine, Ser: 1 mg/dL (ref 0.44–1.00)
Creatinine, Ser: 1 mg/dL (ref 0.44–1.00)
Creatinine, Ser: 1.2 mg/dL — ABNORMAL HIGH (ref 0.44–1.00)
Creatinine, Ser: 1.2 mg/dL — ABNORMAL HIGH (ref 0.44–1.00)
Creatinine, Ser: 1.3 mg/dL — ABNORMAL HIGH (ref 0.44–1.00)
Creatinine, Ser: 1.3 mg/dL — ABNORMAL HIGH (ref 0.44–1.00)
Creatinine, Ser: 1.4 mg/dL — ABNORMAL HIGH (ref 0.44–1.00)
Creatinine, Ser: 1.3 mg/dL — ABNORMAL HIGH (ref 0.44–1.00)
Creatinine, Ser: 1.4 mg/dL — ABNORMAL HIGH (ref 0.44–1.00)
Creatinine, Ser: 1.2 mg/dL — ABNORMAL HIGH (ref 0.44–1.00)
Glucose, Bld: 139 mg/dL — ABNORMAL HIGH (ref 70–99)
Glucose, Bld: 206 mg/dL — ABNORMAL HIGH (ref 70–99)
Glucose, Bld: 232 mg/dL — ABNORMAL HIGH (ref 70–99)
Glucose, Bld: 247 mg/dL — ABNORMAL HIGH (ref 70–99)
Glucose, Bld: 248 mg/dL — ABNORMAL HIGH (ref 70–99)
Glucose, Bld: 250 mg/dL — ABNORMAL HIGH (ref 70–99)
Glucose, Bld: 257 mg/dL — ABNORMAL HIGH (ref 70–99)
Glucose, Bld: 262 mg/dL — ABNORMAL HIGH (ref 70–99)
Glucose, Bld: 272 mg/dL — ABNORMAL HIGH (ref 70–99)
Glucose, Bld: 278 mg/dL — ABNORMAL HIGH (ref 70–99)
Glucose, Bld: 278 mg/dL — ABNORMAL HIGH (ref 70–99)
Glucose, Bld: 295 mg/dL — ABNORMAL HIGH (ref 70–99)
Glucose, Bld: 299 mg/dL — ABNORMAL HIGH (ref 70–99)
Glucose, Bld: 242 mg/dL — ABNORMAL HIGH (ref 70–99)
Glucose, Bld: 253 mg/dL — ABNORMAL HIGH (ref 70–99)
Glucose, Bld: 237 mg/dL — ABNORMAL HIGH (ref 70–99)
HCT: 20 % — ABNORMAL LOW (ref 36.0–46.0)
HCT: 23 % — ABNORMAL LOW (ref 36.0–46.0)
HCT: 23 % — ABNORMAL LOW (ref 36.0–46.0)
HCT: 23 % — ABNORMAL LOW (ref 36.0–46.0)
HCT: 23 % — ABNORMAL LOW (ref 36.0–46.0)
HCT: 24 % — ABNORMAL LOW (ref 36.0–46.0)
HCT: 25 % — ABNORMAL LOW (ref 36.0–46.0)
HCT: 25 % — ABNORMAL LOW (ref 36.0–46.0)
HCT: 26 % — ABNORMAL LOW (ref 36.0–46.0)
HCT: 26 % — ABNORMAL LOW (ref 36.0–46.0)
HCT: 26 % — ABNORMAL LOW (ref 36.0–46.0)
HCT: 29 % — ABNORMAL LOW (ref 36.0–46.0)
HCT: 32 % — ABNORMAL LOW (ref 36.0–46.0)
HCT: 19 % — ABNORMAL LOW (ref 36.0–46.0)
HCT: 20 % — ABNORMAL LOW (ref 36.0–46.0)
HCT: 20 % — ABNORMAL LOW (ref 36.0–46.0)
Hemoglobin: 10.9 g/dL — ABNORMAL LOW (ref 12.0–15.0)
Hemoglobin: 6.8 g/dL — CL (ref 12.0–15.0)
Hemoglobin: 7.8 g/dL — ABNORMAL LOW (ref 12.0–15.0)
Hemoglobin: 7.8 g/dL — ABNORMAL LOW (ref 12.0–15.0)
Hemoglobin: 7.8 g/dL — ABNORMAL LOW (ref 12.0–15.0)
Hemoglobin: 7.8 g/dL — ABNORMAL LOW (ref 12.0–15.0)
Hemoglobin: 8.2 g/dL — ABNORMAL LOW (ref 12.0–15.0)
Hemoglobin: 8.5 g/dL — ABNORMAL LOW (ref 12.0–15.0)
Hemoglobin: 8.5 g/dL — ABNORMAL LOW (ref 12.0–15.0)
Hemoglobin: 8.8 g/dL — ABNORMAL LOW (ref 12.0–15.0)
Hemoglobin: 8.8 g/dL — ABNORMAL LOW (ref 12.0–15.0)
Hemoglobin: 8.8 g/dL — ABNORMAL LOW (ref 12.0–15.0)
Hemoglobin: 9.9 g/dL — ABNORMAL LOW (ref 12.0–15.0)
Hemoglobin: 6.5 g/dL — CL (ref 12.0–15.0)
Hemoglobin: 6.8 g/dL — CL (ref 12.0–15.0)
Hemoglobin: 6.8 g/dL — CL (ref 12.0–15.0)
Potassium: 3.7 mmol/L (ref 3.5–5.1)
Potassium: 3.8 mmol/L (ref 3.5–5.1)
Potassium: 3.9 mmol/L (ref 3.5–5.1)
Potassium: 4.3 mmol/L (ref 3.5–5.1)
Potassium: 4.3 mmol/L (ref 3.5–5.1)
Potassium: 4.4 mmol/L (ref 3.5–5.1)
Potassium: 4.5 mmol/L (ref 3.5–5.1)
Potassium: 4.5 mmol/L (ref 3.5–5.1)
Potassium: 4.5 mmol/L (ref 3.5–5.1)
Potassium: 4.6 mmol/L (ref 3.5–5.1)
Potassium: 4.7 mmol/L (ref 3.5–5.1)
Potassium: 4.7 mmol/L (ref 3.5–5.1)
Potassium: 4.9 mmol/L (ref 3.5–5.1)
Potassium: 4.1 mmol/L (ref 3.5–5.1)
Potassium: 4.2 mmol/L (ref 3.5–5.1)
Potassium: 4.2 mmol/L (ref 3.5–5.1)
Sodium: 138 mmol/L (ref 135–145)
Sodium: 138 mmol/L (ref 135–145)
Sodium: 139 mmol/L (ref 135–145)
Sodium: 139 mmol/L (ref 135–145)
Sodium: 139 mmol/L (ref 135–145)
Sodium: 139 mmol/L (ref 135–145)
Sodium: 139 mmol/L (ref 135–145)
Sodium: 139 mmol/L (ref 135–145)
Sodium: 139 mmol/L (ref 135–145)
Sodium: 140 mmol/L (ref 135–145)
Sodium: 141 mmol/L (ref 135–145)
Sodium: 142 mmol/L (ref 135–145)
Sodium: 145 mmol/L (ref 135–145)
Sodium: 141 mmol/L (ref 135–145)
Sodium: 142 mmol/L (ref 135–145)
Sodium: 141 mmol/L (ref 135–145)
TCO2: 25 mmol/L (ref 22–32)
TCO2: 28 mmol/L (ref 22–32)
TCO2: 32 mmol/L (ref 22–32)
TCO2: 32 mmol/L (ref 22–32)
TCO2: 33 mmol/L — ABNORMAL HIGH (ref 22–32)
TCO2: 33 mmol/L — ABNORMAL HIGH (ref 22–32)
TCO2: 34 mmol/L — ABNORMAL HIGH (ref 22–32)
TCO2: 34 mmol/L — ABNORMAL HIGH (ref 22–32)
TCO2: 35 mmol/L — ABNORMAL HIGH (ref 22–32)
TCO2: 36 mmol/L — ABNORMAL HIGH (ref 22–32)
TCO2: 37 mmol/L — ABNORMAL HIGH (ref 22–32)
TCO2: 37 mmol/L — ABNORMAL HIGH (ref 22–32)
TCO2: 39 mmol/L — ABNORMAL HIGH (ref 22–32)
TCO2: 32 mmol/L (ref 22–32)
TCO2: 33 mmol/L — ABNORMAL HIGH (ref 22–32)
TCO2: 36 mmol/L — ABNORMAL HIGH (ref 22–32)

## 2020-06-29 LAB — ECHOCARDIOGRAM COMPLETE
AR max vel: 2.01 cm2
AV Area VTI: 2.46 cm2
AV Area mean vel: 2.06 cm2
AV Mean grad: 10 mmHg
AV Peak grad: 20.4 mmHg
Ao pk vel: 2.26 m/s
Area-P 1/2: 2.42 cm2
Height: 66 in
S' Lateral: 2.8 cm
Weight: 3996.5 oz

## 2020-06-29 LAB — MAGNESIUM: Magnesium: 2 mg/dL (ref 1.7–2.4)

## 2020-06-29 LAB — RENAL FUNCTION PANEL
Albumin: 1.8 g/dL — ABNORMAL LOW (ref 3.5–5.0)
Albumin: 1.7 g/dL — ABNORMAL LOW (ref 3.5–5.0)
Anion gap: 9 (ref 5–15)
Anion gap: 9 (ref 5–15)
BUN: 37 mg/dL — ABNORMAL HIGH (ref 8–23)
BUN: 62 mg/dL — ABNORMAL HIGH (ref 8–23)
CO2: 35 mmol/L — ABNORMAL HIGH (ref 22–32)
CO2: 34 mmol/L — ABNORMAL HIGH (ref 22–32)
Calcium: 9 mg/dL (ref 8.9–10.3)
Calcium: 8.1 mg/dL — ABNORMAL LOW (ref 8.9–10.3)
Chloride: 95 mmol/L — ABNORMAL LOW (ref 98–111)
Chloride: 92 mmol/L — ABNORMAL LOW (ref 98–111)
Creatinine, Ser: 1.37 mg/dL — ABNORMAL HIGH (ref 0.44–1.00)
Creatinine, Ser: 1.85 mg/dL — ABNORMAL HIGH (ref 0.44–1.00)
GFR, Estimated: 40 mL/min — ABNORMAL LOW (ref >60)
GFR, Estimated: 28 mL/min — ABNORMAL LOW (ref >60)
Glucose, Bld: 285 mg/dL — ABNORMAL HIGH (ref 70–99)
Glucose, Bld: 295 mg/dL — ABNORMAL HIGH (ref 70–99)
Phosphorus: 2.5 mg/dL (ref 2.5–4.6)
Phosphorus: 4 mg/dL (ref 2.5–4.6)
Potassium: 4.6 mmol/L (ref 3.5–5.1)
Potassium: 4.9 mmol/L (ref 3.5–5.1)
Sodium: 139 mmol/L (ref 135–145)
Sodium: 135 mmol/L (ref 135–145)

## 2020-06-29 LAB — LIPID PANEL
Cholesterol: 109 mg/dL (ref 0–200)
HDL: 21 mg/dL — ABNORMAL LOW (ref >40)
LDL Cholesterol: 63 mg/dL (ref 0–99)
Total CHOL/HDL Ratio: 5.2 RATIO
Triglycerides: 127 mg/dL (ref <150)
VLDL: 25 mg/dL (ref 0–40)

## 2020-06-29 LAB — POCT I-STAT 7, (LYTES, BLD GAS, ICA,H+H)
Acid-Base Excess: 19 mmol/L — ABNORMAL HIGH (ref 0.0–2.0)
Bicarbonate: 41.9 mmol/L — ABNORMAL HIGH (ref 20.0–28.0)
Calcium, Ion: 1.18 mmol/L (ref 1.15–1.40)
HCT: 21 % — ABNORMAL LOW (ref 36.0–46.0)
Hemoglobin: 7.1 g/dL — ABNORMAL LOW (ref 12.0–15.0)
O2 Saturation: 94 %
Patient temperature: 101.5
Potassium: 4.6 mmol/L (ref 3.5–5.1)
Sodium: 137 mmol/L (ref 135–145)
TCO2: 43 mmol/L — ABNORMAL HIGH (ref 22–32)
pCO2 arterial: 44.3 mmHg (ref 32.0–48.0)
pH, Arterial: 7.589 — ABNORMAL HIGH (ref 7.350–7.450)
pO2, Arterial: 65 mmHg — ABNORMAL LOW (ref 83.0–108.0)

## 2020-06-29 LAB — GLUCOSE, CAPILLARY
Glucose-Capillary: 210 mg/dL — ABNORMAL HIGH (ref 70–99)
Glucose-Capillary: 227 mg/dL — ABNORMAL HIGH (ref 70–99)
Glucose-Capillary: 234 mg/dL — ABNORMAL HIGH (ref 70–99)
Glucose-Capillary: 258 mg/dL — ABNORMAL HIGH (ref 70–99)
Glucose-Capillary: 273 mg/dL — ABNORMAL HIGH (ref 70–99)
Glucose-Capillary: 273 mg/dL — ABNORMAL HIGH (ref 70–99)
Glucose-Capillary: 276 mg/dL — ABNORMAL HIGH (ref 70–99)

## 2020-06-29 LAB — CBC
HCT: 23.5 % — ABNORMAL LOW (ref 36.0–46.0)
Hemoglobin: 7.4 g/dL — ABNORMAL LOW (ref 12.0–15.0)
MCH: 28.6 pg (ref 26.0–34.0)
MCHC: 31.5 g/dL (ref 30.0–36.0)
MCV: 90.7 fL (ref 80.0–100.0)
Platelets: 59 10*3/uL — ABNORMAL LOW (ref 150–400)
RBC: 2.59 MIL/uL — ABNORMAL LOW (ref 3.87–5.11)
RDW: 18.1 % — ABNORMAL HIGH (ref 11.5–15.5)
WBC: 15.9 10*3/uL — ABNORMAL HIGH (ref 4.0–10.5)
nRBC: 1.4 % — ABNORMAL HIGH (ref 0.0–0.2)

## 2020-06-29 LAB — CALCIUM, IONIZED: Calcium, Ionized, Serum: 4.4 mg/dL — ABNORMAL LOW (ref 4.5–5.6)

## 2020-06-29 MED ORDER — INSULIN ASPART 100 UNIT/ML ~~LOC~~ SOLN
8.0000 [IU] | SUBCUTANEOUS | Status: DC
  Administered 2020-06-29 – 2020-06-30 (×7): 8 [IU] via SUBCUTANEOUS
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
## 2020-06-29 NOTE — Progress Notes (Signed)
  Echocardiogram 2D Echocardiogram has been performed.  Meghan Ford 06/29/2020, 3:30 PM

## 2020-06-29 NOTE — Progress Notes (Signed)
Blood gas analysis demonstrates a metabolic alkalosis.  pH is greater than 7.6.  This is undoubtedly due to the administration of citrate anticoagulation.  There has been issues with clotting and IV citrate has been administeredin order to spare heparin due to thrombocytopenia and acute stroke.   Will hold CRRT for 24 hours.  Will reevaluate after 24 hours.  We will discuss with team, the best mechanism of anticoagulation.
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
## 2020-06-29 NOTE — Progress Notes (Signed)
Dr. Janee Morn notified by prior RN that patient's temperature is 102. Dr. Bedelia Person placed order for sputum cx and to apply cooling blanket.

## 2020-06-29 NOTE — Progress Notes (Signed)
Inpatient Diabetes Program Recommendations  AACE/ADA: New Consensus Statement on Inpatient Glycemic Control   Target Ranges:  Prepandial:   less than 140 mg/dL      Peak postprandial:   less than 180 mg/dL (1-2 hours)      Critically ill patients:  140 - 180 mg/dL   Results for CULLEN, VANALLEN (MRN [id number]) as of 06/29/2020 11:30  Ref. Range 06/17/2020 07:18 06/24/2020 11:59 07/07/2020 19:53 06/11/2020 23:46 06/29/2020 03:49 06/29/2020 07:51 06/29/2020 11:23  Glucose-Capillary Latest Ref Range: 70 - 99 mg/dL 268 (H) 341 (H) 962 (H) 234 (H) 276 (H) 273 (H) 227 (H)   Review of Glycemic Control  Diabetes history: DM2 Outpatient Diabetes medications: None Current orders for Inpatient glycemic control: Novolog 0-15 units Q4H, Novolog 8 units Q4H for tube feeding coverage  Inpatient Diabetes Program Recommendations:    Insulin: Glucose consistently elevated. Please consider discontinuing all SQ insulin ordered and use ICU Glycemic Control Phase 2 IV insulin to get glucose under better control and to help determine insulin needs.  Thanks, Orlando Penner, RN, MSN, CDE Diabetes Coordinator Inpatient Diabetes Program [phone] (Team Pager from 8am to 5pm)

## 2020-06-29 NOTE — Progress Notes (Signed)
    Progress Note from the Palliative Medicine Team at [facility]  Patient Name: Meghan Ford  Date: 06/29/2020 DOB: [birth year]  Age: 80 y.o. MRN#: [id number] Attending Physician: Roslynn Amble, MD Primary Care Physician: Hoy Register, MD Admit Date: 06/25/20   Medical records reviewed    This NP spoke to patient's son/Vicotor and a family meeting is scheduled for tomorrow at 2:00pm for GOCs.  There are 4 siblings to be included Meghan Ford, Meghan Ford, Meghan Ford and Meghan Ford  I secured an interpreter for tomorrow's meeting  Discussed with bedside RN.  Three siblings to visit today   No charge  Lorinda Creed NP  Palliative Medicine Team Team Phone # [phone] Pager [phone]

## 2020-06-29 NOTE — TOC Progression Note (Signed)
Transition of Care [facility]) - Progression Note    Patient Details  Name: Meghan Ford MRN: [id number] Date of Birth: 04-Dec-1940  [facility] CM/SW Contact  Glennon Mac, RN Phone Number: 06/29/2020, 3:21 PM  Clinical Narrative:   CT of the head revealed large LT MCA stroke.  Pt remains obtunded on CRRT.  Plan palliative medicine team intervention for goals of care, with meeting scheduled for tomorrow, 06/14/2020 at 2:00pm.        Barriers to Discharge: Continued Medical Work up  Expected Discharge Plan and Services     Discharge Planning Services: CM Consult   Living arrangements for the past 2 months: Single Family Home                                       Social Determinants of Health (SDOH) Interventions    Readmission Risk Interventions No flowsheet data found.  Quintella Baton, RN, BSN  Trauma/Neuro ICU Case Manager [phone]

## 2020-06-29 NOTE — Progress Notes (Signed)
Critical ABG results given to Dr Janee Morn, similar to prior ABG PH- 7.59 PCO2-44.3 PO2-65 HCO3-41.9  Orders to decrease RR-16
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
## 2020-06-30 ENCOUNTER — Inpatient Hospital Stay (HOSPITAL_COMMUNITY): Payer: No Typology Code available for payment source

## 2020-06-30 DIAGNOSIS — Z515 Encounter for palliative care: Secondary | ICD-10-CM

## 2020-06-30 DIAGNOSIS — I63412 Cerebral infarction due to embolism of left middle cerebral artery: Secondary | ICD-10-CM

## 2020-06-30 DIAGNOSIS — J9601 Acute respiratory failure with hypoxia: Secondary | ICD-10-CM

## 2020-06-30 DIAGNOSIS — N179 Acute kidney failure, unspecified: Secondary | ICD-10-CM

## 2020-06-30 LAB — CBC
HCT: 23.1 % — ABNORMAL LOW (ref 36.0–46.0)
Hemoglobin: 7.2 g/dL — ABNORMAL LOW (ref 12.0–15.0)
MCH: 28.7 pg (ref 26.0–34.0)
MCHC: 31.2 g/dL (ref 30.0–36.0)
MCV: 92 fL (ref 80.0–100.0)
Platelets: 68 10*3/uL — ABNORMAL LOW (ref 150–400)
RBC: 2.51 MIL/uL — ABNORMAL LOW (ref 3.87–5.11)
RDW: 18.4 % — ABNORMAL HIGH (ref 11.5–15.5)
WBC: 14.6 10*3/uL — ABNORMAL HIGH (ref 4.0–10.5)
nRBC: 0.4 % — ABNORMAL HIGH (ref 0.0–0.2)

## 2020-06-30 LAB — HEPATIC FUNCTION PANEL
ALT: 7 U/L (ref 0–44)
AST: 23 U/L (ref 15–41)
Albumin: 1.7 g/dL — ABNORMAL LOW (ref 3.5–5.0)
Alkaline Phosphatase: 98 U/L (ref 38–126)
Bilirubin, Direct: 0.3 mg/dL — ABNORMAL HIGH (ref 0.0–0.2)
Indirect Bilirubin: 0.9 mg/dL (ref 0.3–0.9)
Total Bilirubin: 1.2 mg/dL (ref 0.3–1.2)
Total Protein: 4.5 g/dL — ABNORMAL LOW (ref 6.5–8.1)

## 2020-06-30 LAB — RENAL FUNCTION PANEL
Albumin: 1.7 g/dL — ABNORMAL LOW (ref 3.5–5.0)
Anion gap: 11 (ref 5–15)
BUN: 75 mg/dL — ABNORMAL HIGH (ref 8–23)
CO2: 35 mmol/L — ABNORMAL HIGH (ref 22–32)
Calcium: 8.1 mg/dL — ABNORMAL LOW (ref 8.9–10.3)
Chloride: 92 mmol/L — ABNORMAL LOW (ref 98–111)
Creatinine, Ser: 2.11 mg/dL — ABNORMAL HIGH (ref 0.44–1.00)
GFR, Estimated: 24 mL/min — ABNORMAL LOW (ref >60)
Glucose, Bld: 183 mg/dL — ABNORMAL HIGH (ref 70–99)
Phosphorus: 4.1 mg/dL (ref 2.5–4.6)
Potassium: 4.6 mmol/L (ref 3.5–5.1)
Sodium: 138 mmol/L (ref 135–145)

## 2020-06-30 LAB — GLUCOSE, CAPILLARY
Glucose-Capillary: 209 mg/dL — ABNORMAL HIGH (ref 70–99)
Glucose-Capillary: 143 mg/dL — ABNORMAL HIGH (ref 70–99)
Glucose-Capillary: 122 mg/dL — ABNORMAL HIGH (ref 70–99)

## 2020-06-30 LAB — MAGNESIUM: Magnesium: 2.2 mg/dL (ref 1.7–2.4)

## 2020-06-30 LAB — CALCIUM, IONIZED: Calcium, Ionized, Serum: 4.7 mg/dL (ref 4.5–5.6)

## 2020-06-30 MED ORDER — ACETAMINOPHEN 325 MG PO TABS: 650.0000 mg | ORAL_TABLET | Freq: Four times a day (QID) | ORAL | Status: DC | PRN

## 2020-06-30 MED ORDER — FENTANYL CITRATE (PF) 100 MCG/2ML IJ SOLN
50.0000 ug | Freq: Once | INTRAMUSCULAR | Status: DC
Start: 2020-06-30 — End: 2020-07-01

## 2020-06-30 MED ORDER — GLYCOPYRROLATE 0.2 MG/ML IJ SOLN: 0.2000 mg | INTRAMUSCULAR | Status: DC | PRN

## 2020-06-30 MED ORDER — HALOPERIDOL 0.5 MG PO TABS
0.5000 mg | ORAL_TABLET | ORAL | Status: DC | PRN
  Filled 2020-06-30: qty 1

## 2020-06-30 MED ORDER — HALOPERIDOL LACTATE 5 MG/ML IJ SOLN: 0.5000 mg | INTRAMUSCULAR | Status: DC | PRN

## 2020-06-30 MED ORDER — FENTANYL BOLUS VIA INFUSION
50.0000 ug | INTRAVENOUS | Status: DC | PRN
  Filled 2020-06-30: qty 50

## 2020-06-30 MED ORDER — POLYVINYL ALCOHOL 1.4 % OP SOLN
1.0000 [drp] | Freq: Four times a day (QID) | OPHTHALMIC | Status: DC | PRN
  Filled 2020-06-30: qty 15

## 2020-06-30 MED ORDER — ONDANSETRON 4 MG PO TBDP: 4.0000 mg | ORAL_TABLET | Freq: Four times a day (QID) | ORAL | Status: DC | PRN

## 2020-06-30 MED ORDER — FENTANYL CITRATE (PF) 100 MCG/2ML IJ SOLN
INTRAMUSCULAR | Status: AC
  Filled 2020-06-30: qty 2

## 2020-06-30 MED ORDER — GLYCOPYRROLATE 0.2 MG/ML IJ SOLN
0.2000 mg | INTRAMUSCULAR | Status: DC | PRN
  Filled 2020-06-30: qty 1

## 2020-06-30 MED ORDER — ONDANSETRON HCL 4 MG/2ML IJ SOLN: 4.0000 mg | Freq: Four times a day (QID) | INTRAMUSCULAR | Status: DC | PRN

## 2020-06-30 MED ORDER — GLYCOPYRROLATE 1 MG PO TABS
1.0000 mg | ORAL_TABLET | ORAL | Status: DC | PRN
  Filled 2020-06-30: qty 1

## 2020-06-30 MED ORDER — FENTANYL 2500MCG IN NS 250ML (10MCG/ML) PREMIX INFUSION
50.0000 ug/h | INTRAVENOUS | Status: DC
  Filled 2020-06-30: qty 250

## 2020-06-30 MED ORDER — ACETAMINOPHEN 650 MG RE SUPP: 650.0000 mg | Freq: Four times a day (QID) | RECTAL | Status: DC | PRN

## 2020-06-30 MED ORDER — HALOPERIDOL LACTATE 2 MG/ML PO CONC
0.5000 mg | ORAL | Status: DC | PRN
  Filled 2020-06-30: qty 0.3

## 2020-06-30 MED ORDER — BIOTENE DRY MOUTH MT LIQD: 15.0000 mL | OROMUCOSAL | Status: DC | PRN

## 2020-07-01 ENCOUNTER — Encounter (HOSPITAL_COMMUNITY): Payer: Self-pay | Admitting: Family Medicine

## 2020-07-01 LAB — TYPE AND SCREEN
ABO/RH(D): O POS
Antibody Screen: NEGATIVE
Unit division: 0
Unit division: 0

## 2020-07-01 LAB — BPAM RBC
Blood Product Expiration Date: 202205202359
Blood Product Expiration Date: 202205202359
Unit Type and Rh: 5100
Unit Type and Rh: 5100

## 2020-07-01 LAB — CALCIUM, IONIZED: Calcium, Ionized, Serum: 4.9 mg/dL (ref 4.5–5.6)

## 2020-07-02 LAB — CULTURE, RESPIRATORY W GRAM STAIN

## 2020-07-11 NOTE — Procedures (Signed)
Extubation Procedure Note  Patient Details:   Name: Jeanny Rymer DOB: 04-23-1940 MRN: [id number]   Airway Documentation:    Vent end date: 06/25/2020 Vent end time: 1828   Evaluation  O2 sats: terminal extubation Complications: Complications of terminal extubation  Patient did tolerate procedure well. Bilateral Breath Sounds: Diminished,Rhonchi   No   Pt was terminally extubated per Dr. Bedelia Person.   Merlene Laughter 06/27/2020, 6:29 PM

## 2020-07-11 NOTE — Progress Notes (Addendum)
Additional end of life discussions with entire family. Request for explanation of entire clinical course from one family member, which was provided to their satisfaction, and translated into Spanish via interpreter present. Questions answered to son regarding expectations of clinical outcome. Decision made to transition patient to comfort care with plan for compassionate extubation at 1800, time requested by family. Orders placed, transitioned to DNR status.   Additional critical care time:  Diamantina Monks, MD General and Trauma Surgery [facility]
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
## 2020-07-11 NOTE — Progress Notes (Signed)
Assessed the patient for a PIV and Midline on the left arm. At this time the patient is edematous and with ultrasound I was unable to find a vein. I assessed the upper left arm as well and was unable to find any veins. I spoke with a RN on the floor about the patient and made her aware of the findings. The right arm has an art line and is also edematous.
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
## 2020-07-11 NOTE — Progress Notes (Signed)
Subjective: NAEs o/n  Objective: Vital signs in last 24 hours: Temp:  [98.5 F (36.9 C)-102.56 F (39.2 C)] 98.5 F (36.9 C) (04/20 0800) Pulse Rate:  [52-102] 64 (04/20 1000) Resp:  [16-27] 20 (04/20 1000) BP: (103-182)/(42-71) 107/52 (04/20 1000) SpO2:  [94 %-99 %] 95 % (04/20 1000) Arterial Line BP: (111-181)/(42-67) 165/54 (04/20 1000) FiO2 (%):  [40 %-60 %] 40 % (04/20 0755) Weight:  [120.2 kg] 120.2 kg (04/20 0500)  Intake/Output from previous day: 04/19 0701 - 04/20 0700 In: 2128.8 [I.V.:608.6; NG/GT:1320; IV Piggyback:200.1] Out: 594 [Urine:30; Emesis/NG output:21] Intake/Output this shift: Total I/O In: 220.7 [I.V.:55.7; NG/GT:165] Out: -   Eyes open to stim, does not FC Moving both LEs to central stimulus, though 2-3/5 Some withdrawal in right proximal UE to stim, and slightly more withdrawal in left proximal UE Edematous Dressing changed.  Upper back wound c/d, staples in place.  Lab Results: Recent Labs    06/29/20 0459 06/29/20 0556 06/29/20 1140 07/07/2020 0500  WBC 15.9*  --   --  14.6*  HGB 7.4*   < > 7.1* 7.2*  HCT 23.5*   < > 21.0* 23.1*  PLT 59*  --   --  68*   < > = values in this interval not displayed.   BMET Recent Labs    06/29/20 2203 07/04/2020 0500  NA 135 138  K 4.9 4.6  CL 92* 92*  CO2 34* 35*  GLUCOSE 295* 183*  BUN 62* 75*  CREATININE 1.85* 2.11*  CALCIUM 8.1* 8.1*    Studies/Results: DG Chest 1 View  Result Date: 07/07/2020 CLINICAL DATA:  Hypoxia, pelvic fracture, postoperative EXAM: CHEST  1 VIEW COMPARISON:  Chest radiograph from one day prior. FINDINGS: Endotracheal tube tip is 3.0 cm above the carina. Right internal jugular central venous catheter terminates over the middle third of the SVC. Enteric tube enters stomach with the tip not seen on this image. Vertical skin staples overlie the right hemithorax. Peripheral left mid chest tube in place. Loop recorder overlies the lower left chest. Partially visualized bilateral  posterior spinal fusion hardware in the upper thoracic spine with multilevel vertebroplasty material in the mid to upper thoracic vertebral bodies. Stable cardiomediastinal silhouette with mild cardiomegaly. No pneumothorax. Increased dense left retrocardiac and peripheral left lung base consolidation. No overt pulmonary edema. Probable small right pleural effusion. Multilevel mid to upper posterolateral right rib fractures again noted. IMPRESSION: 1. Well-positioned support structures. No pneumothorax. 2. Increased dense left retrocardiac and peripheral left lung base consolidation, favor a combination of atelectasis and left pleural effusion, with aspiration or developing pneumonia not excluded. 3. Probable small right pleural effusion. Electronically Signed   By: Delbert Phenix M.D.   On: 07/07/2020 17:18   DG Pelvis 1-2 Views  Result Date: 06/24/2020 CLINICAL DATA:  Pelvic pain Ng EXAM: PELVIS - 1-2 VIEW; DG C-ARM 1-60 MIN COMPARISON:  07/13/2020 FLUOROSCOPY TIME:  Radiation Exposure Index (as provided by the fluoroscopic device): Not available If the device does not provide the exposure index: Fluoroscopy Time:  47 seconds Number of Acquired Images:  9 FINDINGS: Initial images again demonstrate pubic ramus fractures. Subsequently fixation screw is noted traversing the sacrum and sacroiliac joints bilaterally from right to left. This appears in satisfactory position. IMPRESSION: Screw fixation of the sacroiliac joints bilaterally. Electronically Signed   By: Alcide Clever M.D.   On: 06/18/2020 16:41   CT HEAD WO CONTRAST  Result Date: 07/04/2020 CLINICAL DATA:  Delirium EXAM: CT HEAD WITHOUT CONTRAST  TECHNIQUE: Contiguous axial images were obtained from the base of the skull through the vertex without intravenous contrast. 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 Subacute small vessel infarct of the right thalamus. Unchanged  appearance of right PCA territory infarct. Predominantly low density right hemispheric extra-axial collection has slightly increased in size. There is trace rightward midline shift. Vascular: Carotid atherosclerosis at the skull base. Skull: Negative Sinuses/Orbits: Negative Other: None IMPRESSION: 1. 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 2. Subacute small vessel infarct of the right thalamus. 3. 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 Electronically Signed   By: Deatra Robinson M.D.   On: 07/05/2020 21:10   DG Pelvis Comp Min 3V  Result Date: 06/27/2020 CLINICAL DATA:  Status post pinning of the sacroiliac joints EXAM: JUDET PELVIS - 3+ VIEW COMPARISON:  None. FINDINGS: Pelvic ring again shows pubic ramus fractures bilaterally stable from prior CT examination. The known sacral fractures are not well appreciated on this exam. No new fracture is seen. Fracture fragments are in near anatomic alignment. IMPRESSION: Status post screw fixation through the sacroiliac joints bilaterally. Electronically Signed   By: Alcide Clever M.D.   On: 06/21/2020 19:03   DG CHEST PORT 1 VIEW  Result Date: July 05, 2020 CLINICAL DATA:  Left pneumothorax. EXAM: PORTABLE CHEST 1 VIEW COMPARISON:  06/29/2020. FINDINGS: Patient rotated to the left. Endotracheal tube, NG tube, right IJ line in stable position. Stable mediastinal prominence. Cardiac monitor device noted stable position. Stable cardiomegaly. Progressive left lower lobe atelectasis. Diffuse infiltrates/edema right lung. Small bilateral pleural effusions. No pneumothorax. Bilateral rib fractures again noted. Surgical staples over the chest. Prior thoracic spine fusion. Prior left shoulder replacement. IMPRESSION: 1.  Lines and tubes in stable position.  No pneumothorax. 2.  Stable cardiomegaly. 3. Progressive left lower lobe atelectasis.  Diffuse infiltrates/edema right lung. Small bilateral pleural effusions. 4.  Bilateral rib fractures again noted. Electronically Signed   By: Maisie Fus  Register   On: Jul 05, 2020 05:58   DG CHEST PORT 1 VIEW  Result Date: 06/29/2020 CLINICAL DATA:  Chest tube removal EXAM: PORTABLE CHEST 1 VIEW COMPARISON:  06/29/2020, 07/01/2020, 06/29/2020 FINDINGS: Endotracheal tube tip partially obscured by cutaneous staples and spinal rods, tip may be just beyond thoracic inlet. Esophageal tube tip below the diaphragm but incompletely visualized. Removal of left-sided chest tube. No definitive pneumothorax is seen. Posterior spinal rods and fixating screws. Small bilateral pleural effusions and left greater than right basilar airspace disease. Cardiomegaly with vascular congestion. Aortic atherosclerosis. Numerous bilateral displaced rib fractures. Right IJ central venous catheter tip over the SVC. Recording device over the left lower chest. IMPRESSION: 1. Removal of left-sided chest tube. No definitive pneumothorax. Poorly visible tip of endotracheal tube which is obscured by overlying hardware 2. Small bilateral pleural effusions and left greater than right basilar airspace disease. 3. Cardiomegaly with vascular congestion 4. Numerous bilateral rib fractures Electronically Signed   By: Jasmine Pang M.D.   On: 06/29/2020 20:54   DG CHEST PORT 1 VIEW  Result Date: 06/29/2020 CLINICAL DATA:  Intubation.  Pneumothorax.  Chest tube. EXAM: PORTABLE CHEST 1 VIEW COMPARISON:  06/24/2020. FINDINGS: Surgical staples noted over the neck and chest. Endotracheal tube, NG tube, right IJ line, left chest tube in stable position. No pneumothorax. Mediastinum appears stable. Cardiac monitoring device noted. Stable cardiomegaly. Persistent bibasilar atelectasis/infiltrates and bilateral pleural  effusions. Interim improvement in aeration in the left lung base. Left shoulder replacement. Prior thoracic spine fusion. Multiple bilateral rib  fractures again noted. IMPRESSION: 1. Lines and tubes including left chest tube in stable position. No pneumothorax. 2. Persistent bibasilar atelectasis/infiltrates and bilateral pleural effusions. Interim improvement in aeration in the left lung base noted. 3.  Multiple bilateral rib fractures again noted. Electronically Signed   By: Maisie Fus  Register   On: 06/29/2020 06:22   DG C-Arm 1-60 Min  Result Date: 06/14/2020 CLINICAL DATA:  Pelvic pain Ng EXAM: PELVIS - 1-2 VIEW; DG C-ARM 1-60 MIN COMPARISON:  2020/07/03 FLUOROSCOPY TIME:  Radiation Exposure Index (as provided by the fluoroscopic device): Not available If the device does not provide the exposure index: Fluoroscopy Time:  47 seconds Number of Acquired Images:  9 FINDINGS: Initial images again demonstrate pubic ramus fractures. Subsequently fixation screw is noted traversing the sacrum and sacroiliac joints bilaterally from right to left. This appears in satisfactory position. IMPRESSION: Screw fixation of the sacroiliac joints bilaterally. Electronically Signed   By: Alcide Clever M.D.   On: 07/09/2020 16:41   ECHOCARDIOGRAM COMPLETE  Result Date: 06/29/2020    ECHOCARDIOGRAM REPORT   Patient Name:   Meghan Ford Date of Exam: 06/29/2020 Medical Rec #:  [id number]                 Height:       66.0 in Accession #:    [id number]                Weight:       249.8 lb Date of Birth:  03-20-1940                 BSA:          2.198 m Patient Age:    79 years                  BP:           182/52 mmHg Patient Gender: F                         HR:           75 bpm. Exam Location:  Inpatient Procedure: 2D Echo Indications:    Stroke I63.9  History:        Patient has no prior history of Echocardiogram examinations.  Sonographer:    Thurman Coyer RDCS (AE) Referring Phys: Eliseo Gum, B IMPRESSIONS  1. Left ventricular ejection fraction, by estimation, is 65 to 70%. The left ventricle has normal function. The left ventricle has no regional  wall motion abnormalities. There is mild concentric left ventricular hypertrophy. Left ventricular diastolic parameters are consistent with Grade I diastolic dysfunction (impaired relaxation). Elevated left atrial pressure.  2. Right ventricular systolic function is normal. The right ventricular size is mildly enlarged. There is moderately elevated pulmonary artery systolic pressure.  3. Left atrial size was mildly dilated.  4. The mitral valve is normal in structure. No evidence of mitral valve regurgitation.  5. The aortic valve is tricuspid. There is mild calcification of the aortic valve. There is mild thickening of the aortic valve. Aortic valve regurgitation is not visualized. Mild aortic valve sclerosis is present, with no evidence of aortic valve stenosis.  6. The inferior vena cava is dilated in size with <50% respiratory variability, suggesting right atrial pressure of 15 mmHg. FINDINGS  Left Ventricle: Left ventricular ejection fraction, by  estimation, is 65 to 70%. The left ventricle has normal function. The left ventricle has no regional wall motion abnormalities. The left ventricular internal cavity size was normal in size. There is  mild concentric left ventricular hypertrophy. Left ventricular diastolic parameters are consistent with Grade I diastolic dysfunction (impaired relaxation). Elevated left atrial pressure. Right Ventricle: The right ventricular size is mildly enlarged. No increase in right ventricular wall thickness. Right ventricular systolic function is normal. There is moderately elevated pulmonary artery systolic pressure. The tricuspid regurgitant velocity is 2.94 m/s, and with an assumed right atrial pressure of 15 mmHg, the estimated right ventricular systolic pressure is 49.6 mmHg. Left Atrium: Left atrial size was mildly dilated. Right Atrium: Right atrial size was normal in size. Pericardium: There is no evidence of pericardial effusion. Mitral Valve: The mitral valve is normal in  structure. Mild mitral annular calcification. No evidence of mitral valve regurgitation. Tricuspid Valve: The tricuspid valve is normal in structure. Tricuspid valve regurgitation is mild. Aortic Valve: The aortic valve is tricuspid. There is mild calcification of the aortic valve. There is mild thickening of the aortic valve. Aortic valve regurgitation is not visualized. Mild aortic valve sclerosis is present, with no evidence of aortic valve stenosis. Aortic valve mean gradient measures 10.0 mmHg. Aortic valve peak gradient measures 20.4 mmHg. Aortic valve area, by VTI measures 2.46 cm. Pulmonic Valve: The pulmonic valve was grossly normal. Pulmonic valve regurgitation is trivial. Aorta: The aortic root and ascending aorta are structurally normal, with no evidence of dilitation. Venous: The inferior vena cava is dilated in size with less than 50% respiratory variability, suggesting right atrial pressure of 15 mmHg. IAS/Shunts: No atrial level shunt detected by color flow Doppler.  LEFT VENTRICLE PLAX 2D LVIDd:         4.00 cm  Diastology LVIDs:         2.80 cm  LV e' medial:    5.22 cm/s LV PW:         1.20 cm  LV E/e' medial:  19.5 LV IVS:        1.40 cm  LV e' lateral:   8.70 cm/s LVOT diam:     2.10 cm  LV E/e' lateral: 11.7 LV SV:         106 LV SV Index:   48 LVOT Area:     3.46 cm  RIGHT VENTRICLE RV S prime:     10.70 cm/s TAPSE (M-mode): 1.9 cm LEFT ATRIUM             Index       RIGHT ATRIUM           Index LA diam:        4.10 cm 1.87 cm/m  RA Area:     12.20 cm LA Vol (A2C):   50.4 ml 22.93 ml/m RA Volume:   28.30 ml  12.88 ml/m LA Vol (A4C):   67.0 ml 30.48 ml/m LA Biplane Vol: 58.5 ml 26.62 ml/m  AORTIC VALVE AV Area (Vmax):    2.01 cm AV Area (Vmean):   2.06 cm AV Area (VTI):     2.46 cm AV Vmax:           226.00 cm/s AV Vmean:          151.000 cm/s AV VTI:            0.432 m AV Peak Grad:      20.4 mmHg AV Mean Grad:  10.0 mmHg LVOT Vmax:         131.00 cm/s LVOT Vmean:        89.600  cm/s LVOT VTI:          0.307 m LVOT/AV VTI ratio: 0.71  AORTA Ao Root diam: 2.90 cm MITRAL VALVE                TRICUSPID VALVE MV Area (PHT): 2.42 cm     TR Peak grad:   34.6 mmHg MV Decel Time: 313 msec     TR Vmax:        294.00 cm/s MV E velocity: 102.00 cm/s MV A velocity: 118.00 cm/s  SHUNTS MV E/A ratio:  0.86         Systemic VTI:  0.31 m                             Systemic Diam: 2.10 cm Mihai Croitoru MD Electronically signed by Thurmon FairMihai Croitoru MD Signature Date/Time: 06/29/2020/4:27:52 PM    Final     Assessment/Plan: 80 yo F with DM with severe polytrauma including  - given the totality of her critical injuries, including bilateral strokes, her chance of recovery with any sort of reasonable quality of life is nil, and a progressive decline, rapid or slow, is extremely likely.  With the severity of her T5 fracture and her poor bone quality, her spine stabilization surgery was unlikely to afford her the ability to ambulate again but rather was done to help with mobilization and take her off log roll precautions.   Transition to palliative care would certainly appropriate at this juncture.  Meghan Ford 07/01/2020, 10:47 AM
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
## 2020-07-11 NOTE — Progress Notes (Signed)
Inpatient Diabetes Program Recommendations  AACE/ADA: New Consensus Statement on Inpatient Glycemic Control   Target Ranges:  Prepandial:   less than 140 mg/dL      Peak postprandial:   less than 180 mg/dL (1-2 hours)      Critically ill patients:  140 - 180 mg/dL   Results for Meghan Ford, Meghan Ford (MRN [id number]) as of Jul 21, 2020 11:11  Ref. Range 06/29/2020 07:51 06/29/2020 11:23 06/29/2020 16:18 06/29/2020 20:08 06/29/2020 23:56 07/21/2020 03:34 07-21-20 07:49 Jul 21, 2020 11:07  Glucose-Capillary Latest Ref Range: 70 - 99 mg/dL 448 (H) 185 (H) 631 (H) 273 (H) 258 (H) 209 (H) 143 (H) 122 (H)   Review of Glycemic Control  Diabetes history: DM2 Outpatient Diabetes medications: None Current orders for Inpatient glycemic control: Novolog 0-15 units Q4H, Novolog 8 units Q4H for tube feeding coverage  Inpatient Diabetes Program Recommendations:    Insulin: Please consider increasing tube feeding coverage to Novolog 12 units Q4H. If glucose remains consistently over 180 mg/dl, may want to consider discontinuing all SQ insulin ordered and use ICU Glycemic Control Phase 2 IV insulin to get glucose under better control and to help determine insulin needs.  Thanks, Orlando Penner, RN, MSN, CDE Diabetes Coordinator Inpatient Diabetes Program [phone] (Team Pager from 8am to 5pm)
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
## 2020-07-11 DEATH — deceased
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
## 2022-03-16 IMAGING — RF DG THORACIC SPINE 2V
1 series · 3 of 3 positions shown · non-contrast
Comparison: 06/21/2020

CLINICAL DATA: Posterior thoracic fusion

EXAM:
THORACIC SPINE 2 VIEWS; DG C-ARM 1-60 MIN

[Series 1: unknown protocol · 0.20mm/px · 3 of 3 slices shown]
[im 1/3]
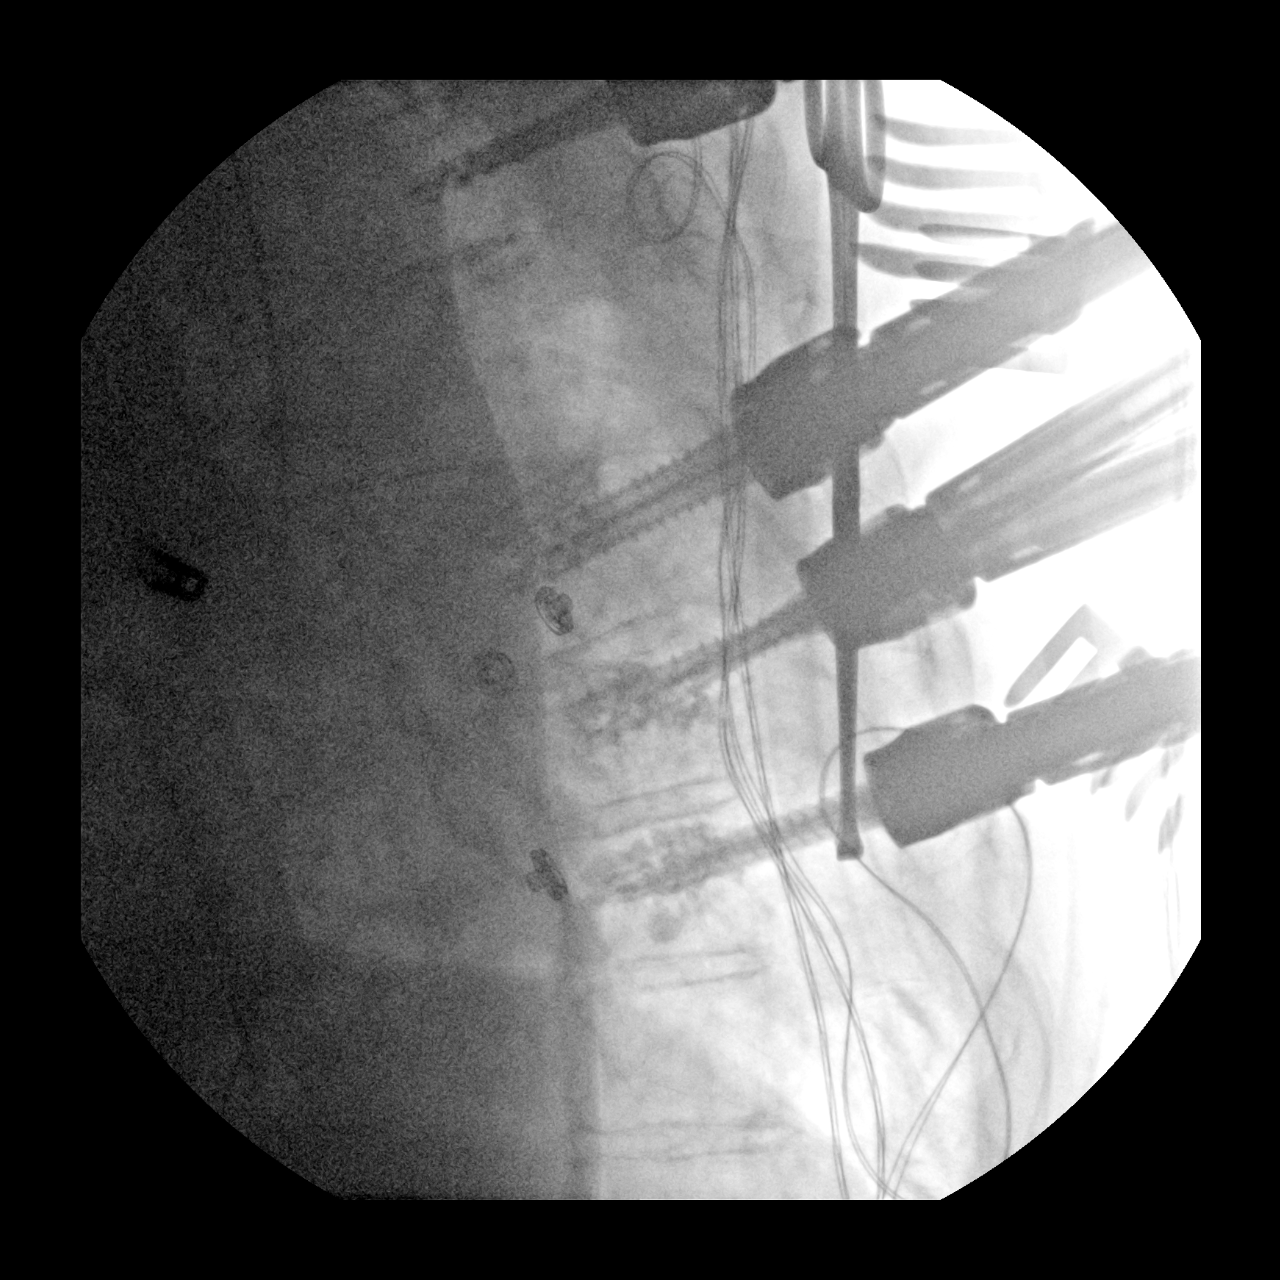
[im 2/3]
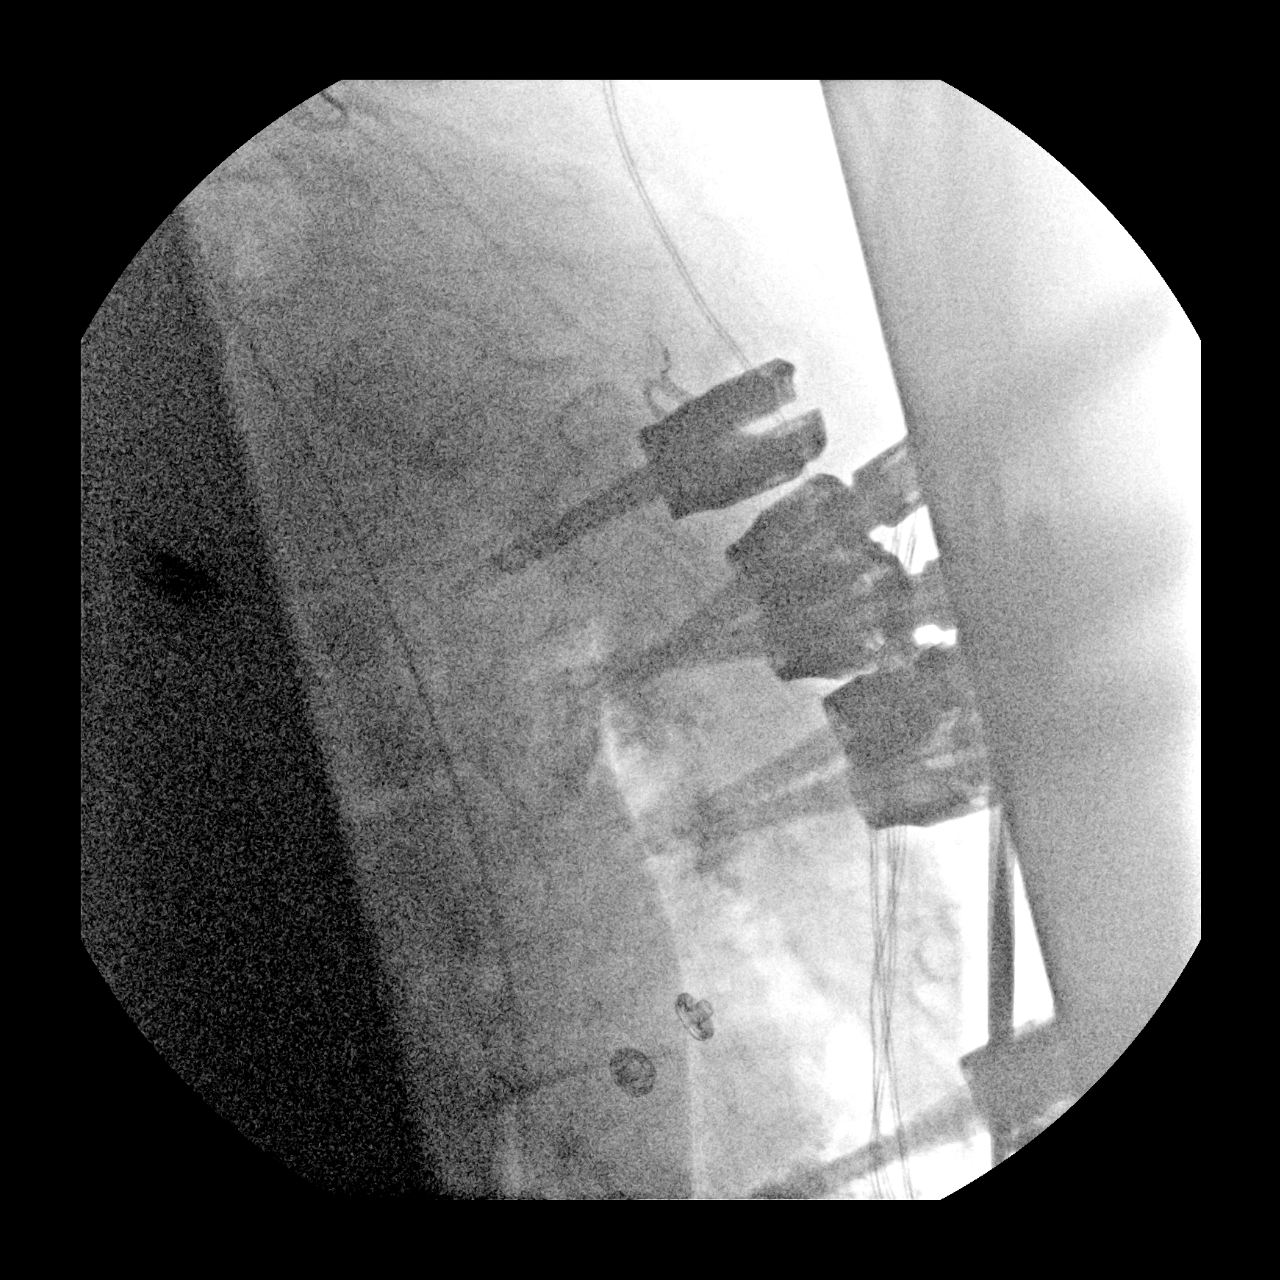
[im 3/3]
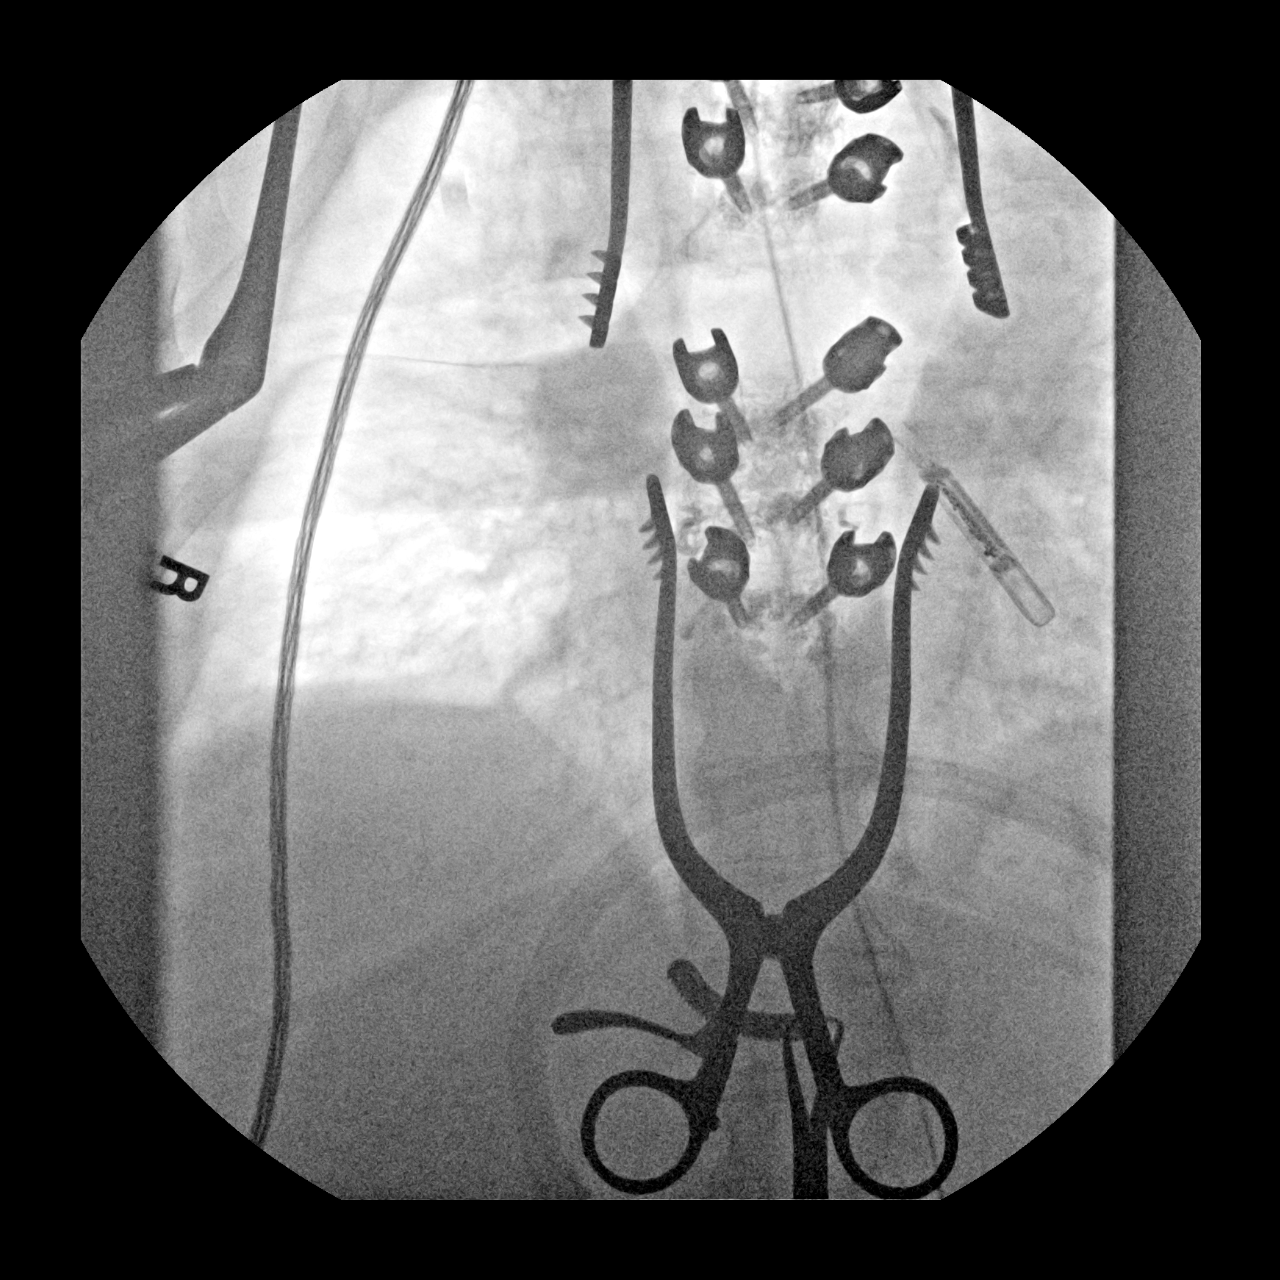

[3 of 3 positions shown; findings below may reference images not displayed]

FLUOROSCOPY TIME:  Radiation Exposure Index (as provided by the
fluoroscopic device): 34.7 mGy

If the device does not provide the exposure index:

Fluoroscopy Time:  43 seconds

Number of Acquired Images:  3
FINDINGS: Images demonstrate pedicle screws at several thoracic levels
bracketing the known T5 fracture. Cement augmentation is noted at
several levels.
IMPRESSION: Multilevel thoracic fusion.

## 2022-03-16 IMAGING — RF DG C-ARM 1-60 MIN
1 series · 3 of 3 positions shown · non-contrast
Comparison: 06/21/2020

CLINICAL DATA: Posterior thoracic fusion

EXAM:
THORACIC SPINE 2 VIEWS; DG C-ARM 1-60 MIN

[Series 1: unknown protocol · 0.20mm/px · 3 of 3 slices shown]
[im 1/3]
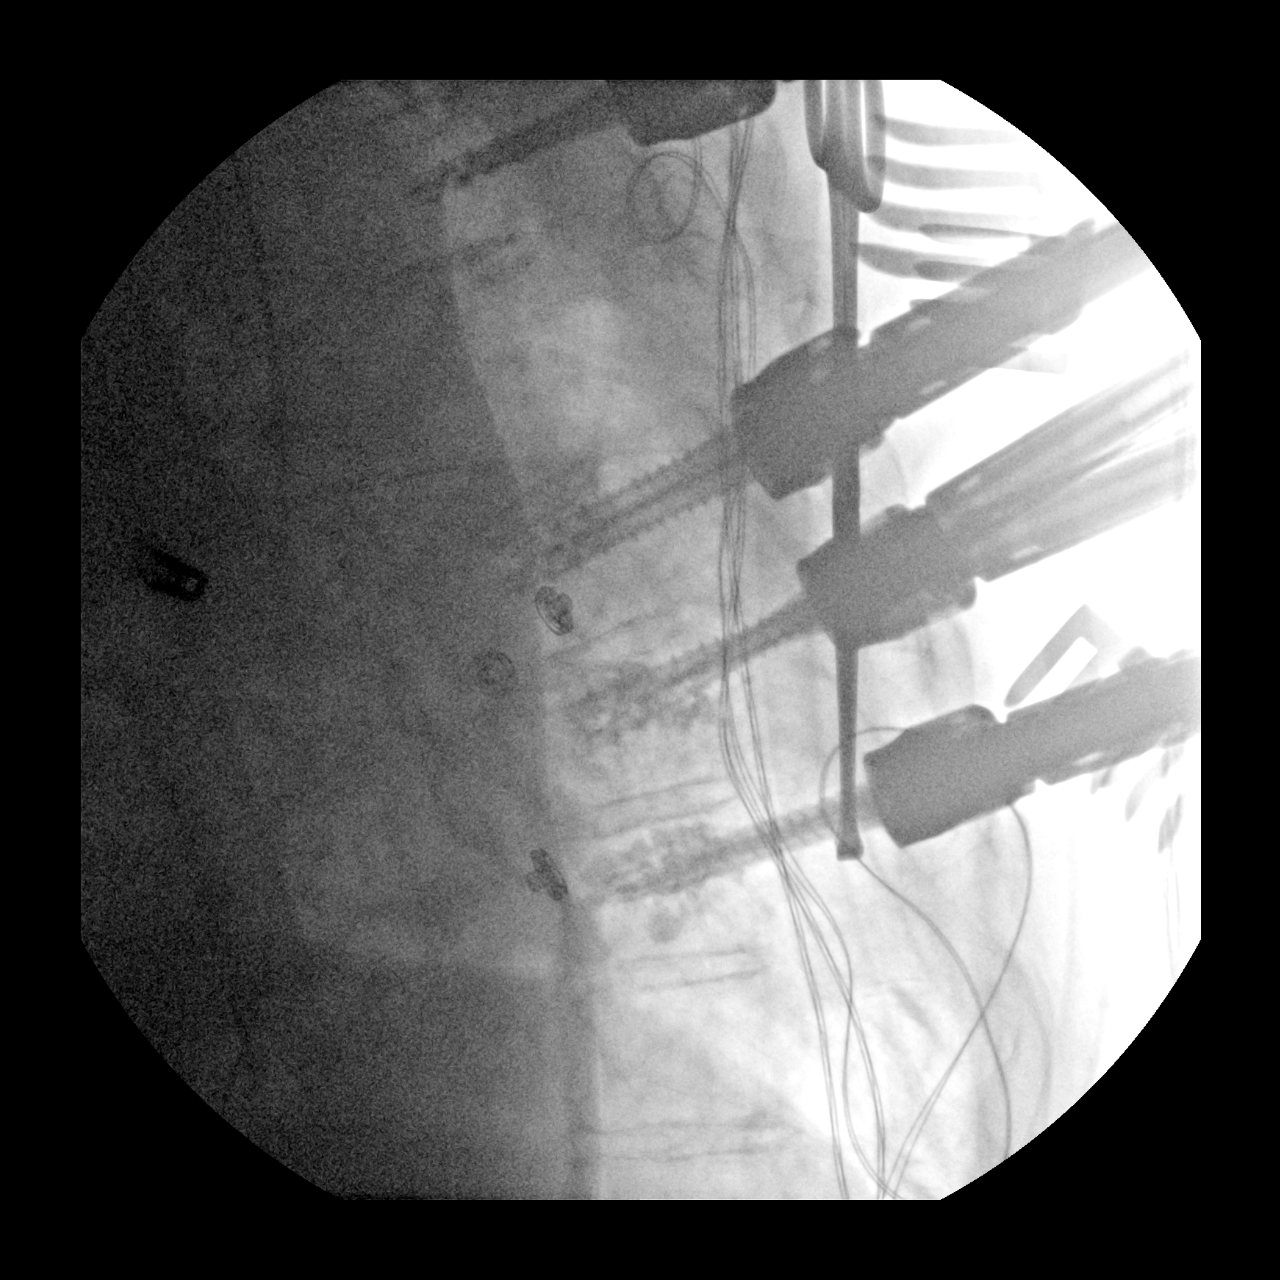
[im 2/3]
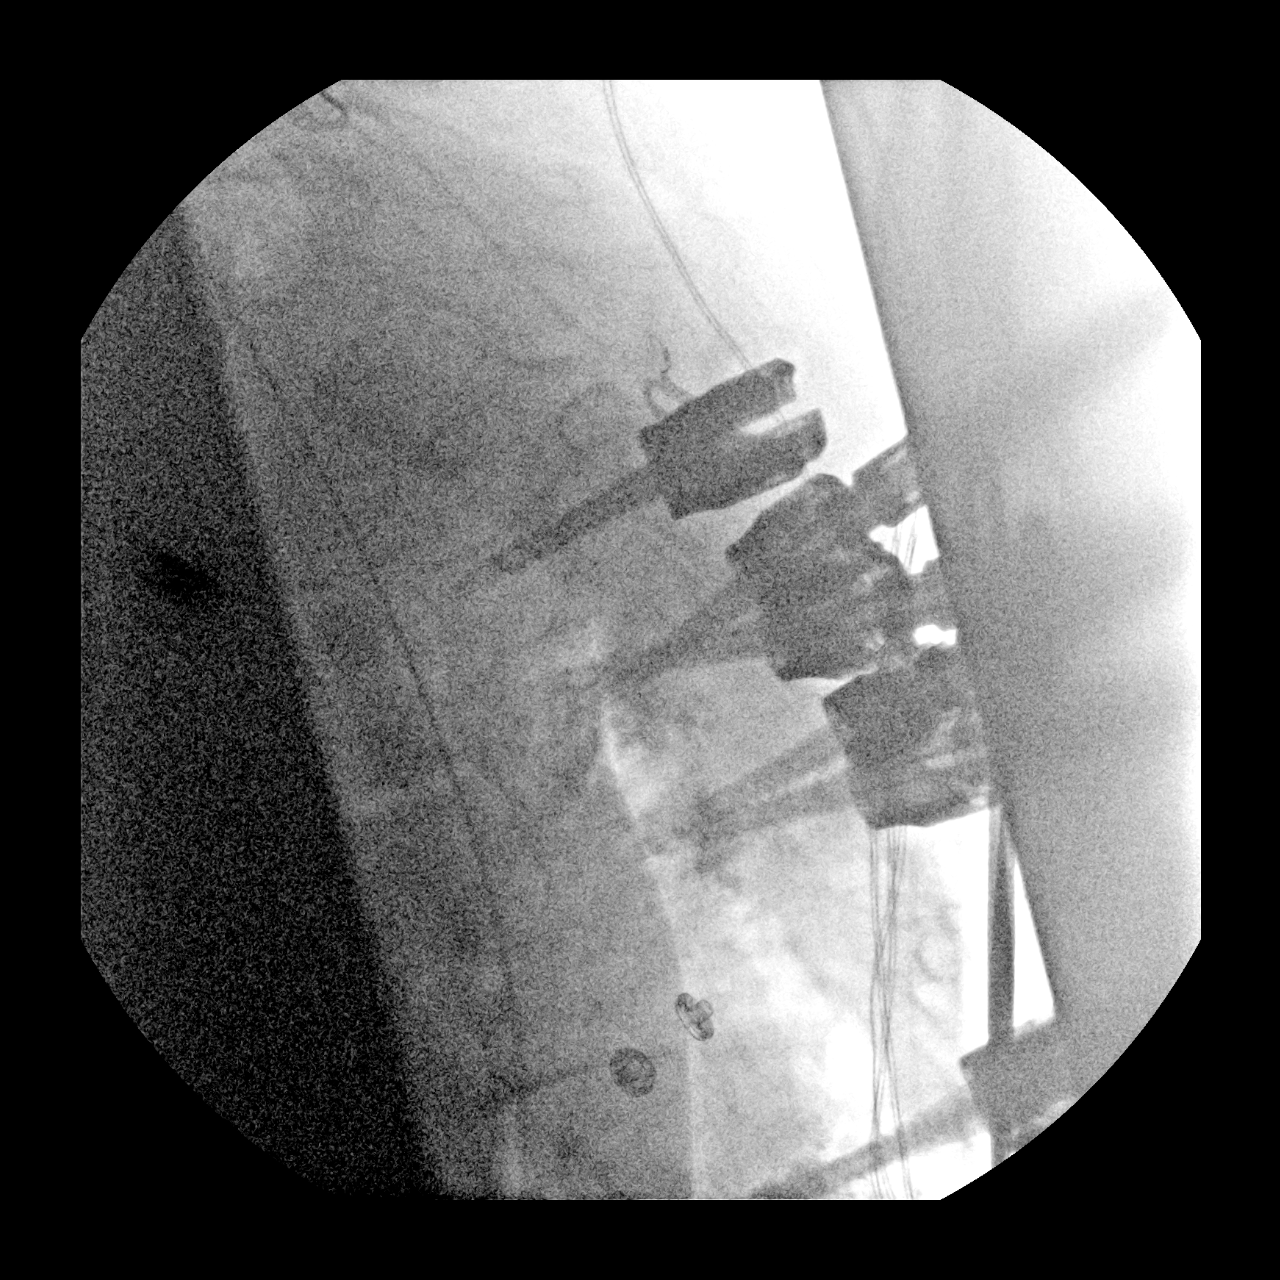
[im 3/3]
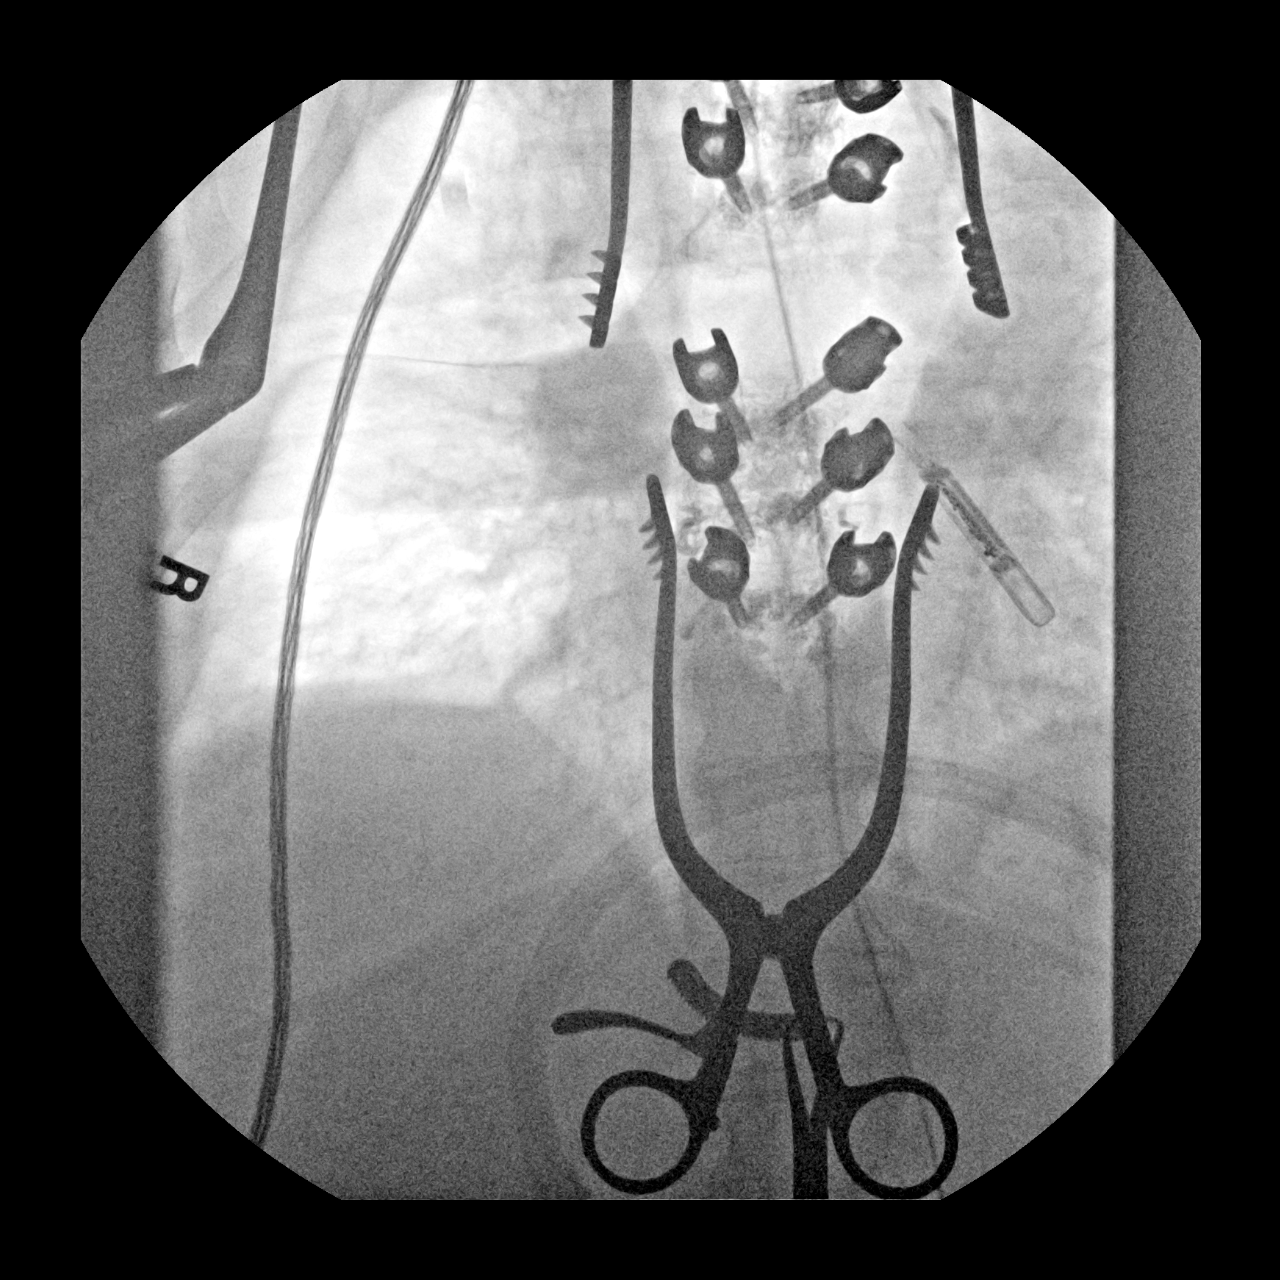

[3 of 3 positions shown; findings below may reference images not displayed]

FLUOROSCOPY TIME:  Radiation Exposure Index (as provided by the
fluoroscopic device): 34.7 mGy

If the device does not provide the exposure index:

Fluoroscopy Time:  43 seconds

Number of Acquired Images:  3
FINDINGS: Images demonstrate pedicle screws at several thoracic levels
bracketing the known T5 fracture. Cement augmentation is noted at
several levels.
IMPRESSION: Multilevel thoracic fusion.

## 2022-03-16 IMAGING — DX DG CHEST 1V PORT
1 series · 1 of 1 positions shown · non-contrast
Comparison: 06/23/2020

CLINICAL DATA: Left chest tube placement

EXAM:
PORTABLE CHEST 1 VIEW

[chest]
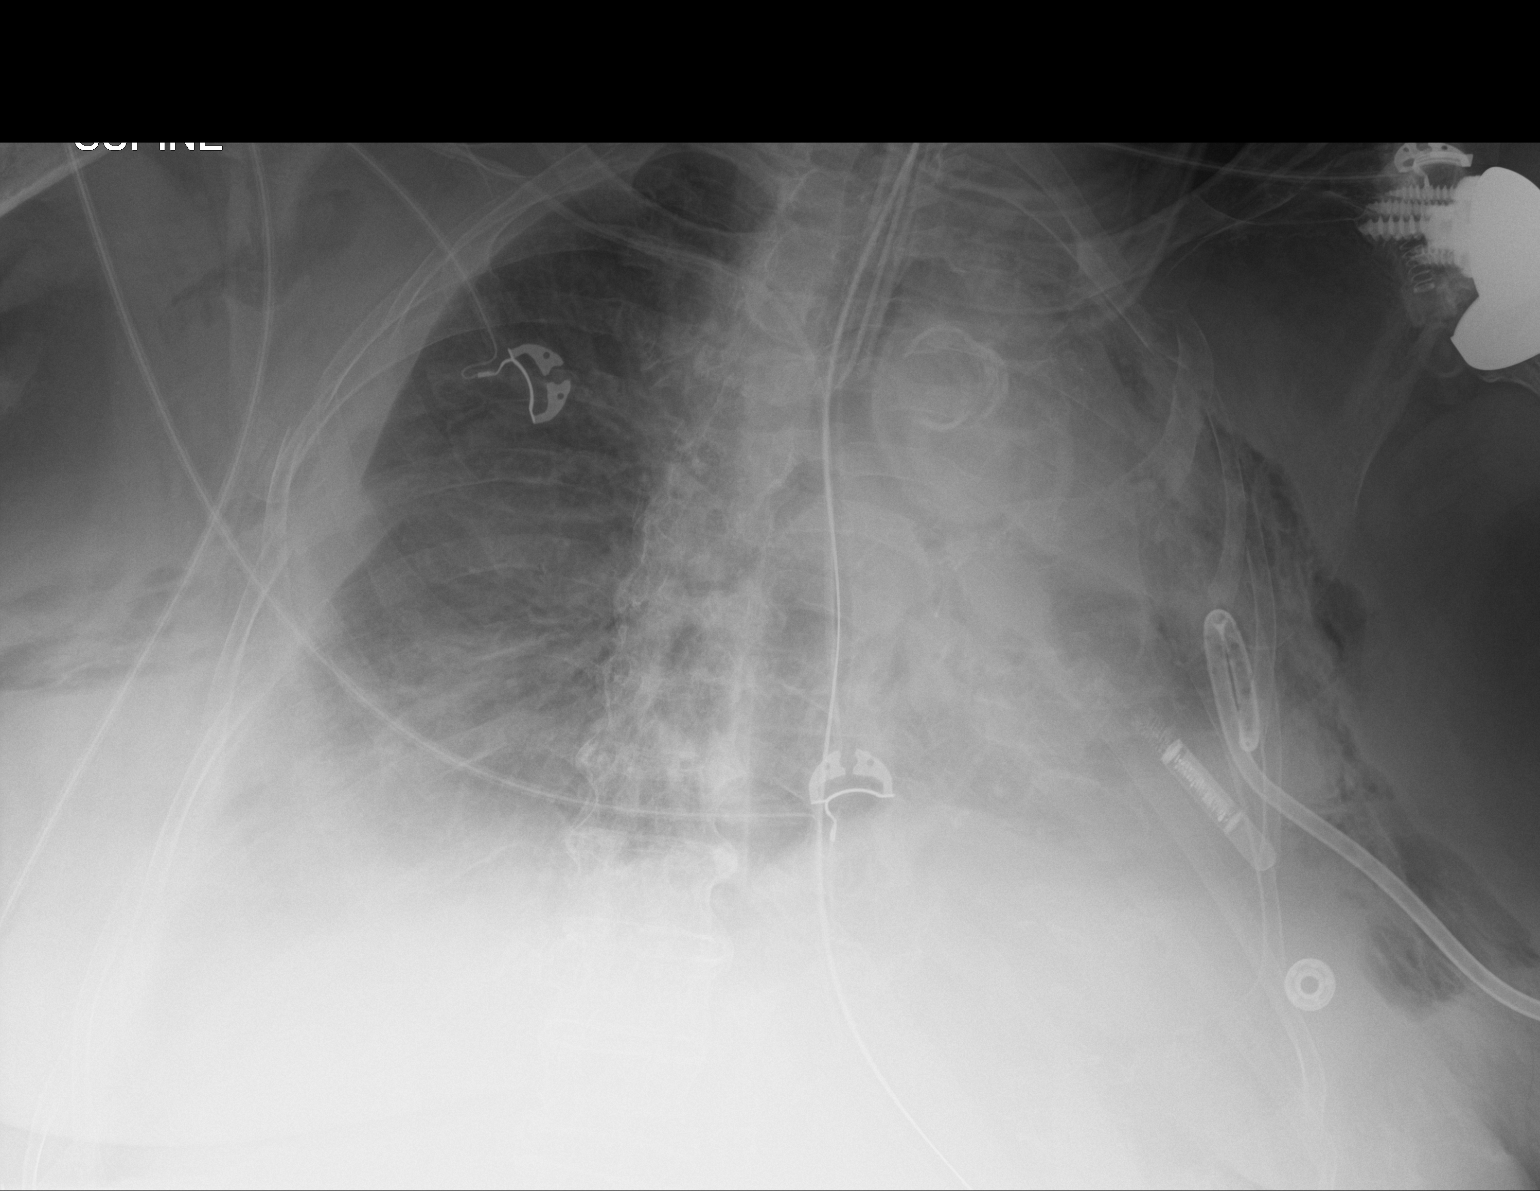

[1 of 1 positions shown; findings below may reference images not displayed]

FINDINGS: Left chest tube is in place with re-expansion of the left lung. No
visible residual pneumothorax. Numerous bilateral rib fractures and
airspace disease throughout the lungs, left greater than right.
Endotracheal tube, NG tube and right central line are unchanged.
Small to moderate right pleural effusion, slightly increased since
prior study. Subcutaneous emphysema throughout the chest wall
bilaterally.
IMPRESSION: Interval placement of left chest tube with re-expansion of the left
lung. No visible residual pneumothorax. Subcutaneous emphysema noted
bilaterally.

Bilateral airspace disease, left greater than right, unchanged.

Increasing moderate right effusion.
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
## 2022-03-20 IMAGING — DX DG CHEST 1V PORT
1 series · 1 of 1 positions shown · non-contrast
Comparison: Chest x-rays dated 06/26/2020 06/25/2020.

CLINICAL DATA: Respiratory dependent.  Evaluate atelectasis.

EXAM:
PORTABLE CHEST 1 VIEW

[chest ap]
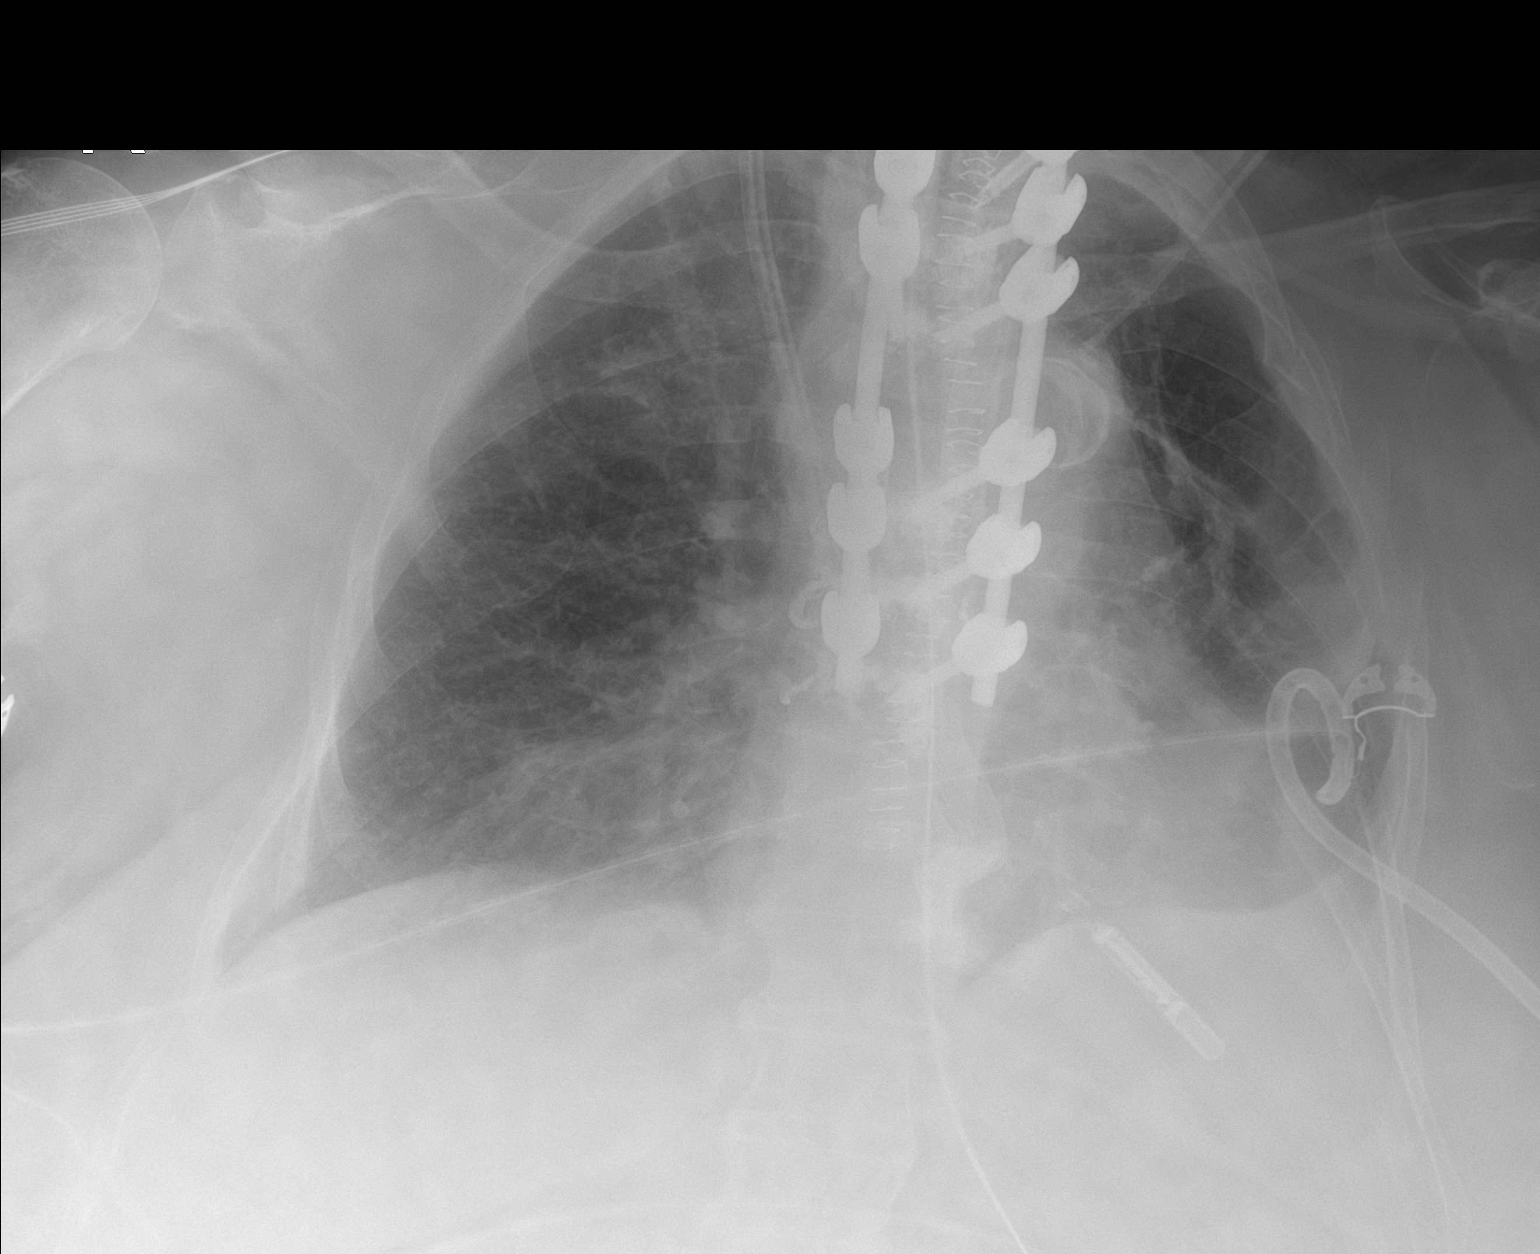

[1 of 1 positions shown; findings below may reference images not displayed]

FINDINGS: Endotracheal tube appears grossly well positioned with tip above the
level the carina, partially obscured by fixation hardware in the
lumbar spine. Enteric tube passes below the diaphragm. LEFT-sided
chest tube is stable in position.

Stable opacity at the LEFT costophrenic angle, likely small pleural
effusion with the adjacent mild atelectasis. RIGHT lung appears
clear. No pneumothorax is seen.
IMPRESSION: 1. Support apparatus appears grossly well positioned.
2. Stable opacity at the LEFT lung base/costophrenic angle, likely
small pleural effusion with adjacent mild atelectasis.
3. Improved aeration of the RIGHT lung suggesting improved fluid
status with decreased edema.
